# Patient Record
Sex: Male | Born: 1935 | Race: White | Hispanic: No | Marital: Married | Smoking: Former smoker
Health system: Southern US, Community
[De-identification: ages and names within clinical notes are randomized; demographics above are authoritative.]

## PROBLEM LIST (undated history)

## (undated) DIAGNOSIS — F329 Major depressive disorder, single episode, unspecified: Secondary | ICD-10-CM

## (undated) DIAGNOSIS — K219 Gastro-esophageal reflux disease without esophagitis: Secondary | ICD-10-CM

## (undated) DIAGNOSIS — R0982 Postnasal drip: Secondary | ICD-10-CM

## (undated) DIAGNOSIS — E119 Type 2 diabetes mellitus without complications: Secondary | ICD-10-CM

## (undated) DIAGNOSIS — N400 Enlarged prostate without lower urinary tract symptoms: Secondary | ICD-10-CM

## (undated) DIAGNOSIS — F419 Anxiety disorder, unspecified: Secondary | ICD-10-CM

## (undated) DIAGNOSIS — G4730 Sleep apnea, unspecified: Secondary | ICD-10-CM

## (undated) DIAGNOSIS — F32A Depression, unspecified: Secondary | ICD-10-CM

## (undated) DIAGNOSIS — D649 Anemia, unspecified: Secondary | ICD-10-CM

## (undated) DIAGNOSIS — Z8719 Personal history of other diseases of the digestive system: Secondary | ICD-10-CM

## (undated) DIAGNOSIS — M1990 Unspecified osteoarthritis, unspecified site: Secondary | ICD-10-CM

## (undated) DIAGNOSIS — J189 Pneumonia, unspecified organism: Secondary | ICD-10-CM

## (undated) DIAGNOSIS — J302 Other seasonal allergic rhinitis: Secondary | ICD-10-CM

## (undated) HISTORY — PX: LEG SURGERY: SHX1003

## (undated) HISTORY — PX: EYE SURGERY: SHX253

## (undated) HISTORY — PX: TONSILLECTOMY: SUR1361

## (undated) HISTORY — PX: COLONOSCOPY W/ BIOPSIES AND POLYPECTOMY: SHX1376

---
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
## 1959-06-25 HISTORY — PX: KNEE SURGERY: SHX244

## 1979-06-25 HISTORY — PX: KNEE ARTHROSCOPY: SUR90

## 1990-06-24 HISTORY — PX: JOINT REPLACEMENT: SHX530

## 1993-06-24 HISTORY — PX: BACK SURGERY: SHX140

## 1994-06-24 HISTORY — PX: JOINT REPLACEMENT: SHX530

## 1997-10-20 ENCOUNTER — Encounter: Admission: RE | Admit: 1997-10-20 | Discharge: 1998-01-18 | Payer: Self-pay | Admitting: *Deleted

## 2000-11-07 ENCOUNTER — Ambulatory Visit (HOSPITAL_COMMUNITY): Admission: RE | Admit: 2000-11-07 | Discharge: 2000-11-07 | Payer: Self-pay | Admitting: Gastroenterology

## 2000-11-29 ENCOUNTER — Ambulatory Visit (HOSPITAL_COMMUNITY): Admission: RE | Admit: 2000-11-29 | Discharge: 2000-11-29 | Payer: Self-pay | Admitting: Orthopaedic Surgery

## 2000-11-29 ENCOUNTER — Encounter: Payer: Self-pay | Admitting: Orthopaedic Surgery

## 2000-11-30 ENCOUNTER — Ambulatory Visit (HOSPITAL_COMMUNITY): Admission: RE | Admit: 2000-11-30 | Discharge: 2000-11-30 | Payer: Self-pay | Admitting: Orthopaedic Surgery

## 2000-11-30 ENCOUNTER — Encounter: Payer: Self-pay | Admitting: Orthopaedic Surgery

## 2000-12-02 ENCOUNTER — Encounter: Payer: Self-pay | Admitting: Orthopaedic Surgery

## 2000-12-02 ENCOUNTER — Inpatient Hospital Stay (HOSPITAL_COMMUNITY): Admission: RE | Admit: 2000-12-02 | Discharge: 2000-12-06 | Payer: Self-pay | Admitting: Orthopaedic Surgery

## 2000-12-03 ENCOUNTER — Encounter: Payer: Self-pay | Admitting: Orthopedic Surgery

## 2001-03-19 ENCOUNTER — Ambulatory Visit (HOSPITAL_BASED_OUTPATIENT_CLINIC_OR_DEPARTMENT_OTHER): Admission: RE | Admit: 2001-03-19 | Discharge: 2001-03-20 | Payer: Self-pay | Admitting: Orthopaedic Surgery

## 2001-04-17 ENCOUNTER — Encounter: Admission: RE | Admit: 2001-04-17 | Discharge: 2001-06-12 | Payer: Self-pay | Admitting: Orthopaedic Surgery

## 2002-04-13 ENCOUNTER — Encounter: Admission: RE | Admit: 2002-04-13 | Discharge: 2002-05-05 | Payer: Self-pay | Admitting: Family Medicine

## 2002-09-26 ENCOUNTER — Emergency Department (HOSPITAL_COMMUNITY): Admission: EM | Admit: 2002-09-26 | Discharge: 2002-09-26 | Payer: Self-pay | Admitting: Emergency Medicine

## 2003-06-25 HISTORY — PX: JOINT REPLACEMENT: SHX530

## 2004-11-13 ENCOUNTER — Ambulatory Visit: Payer: Self-pay | Admitting: Pulmonary Disease

## 2004-12-12 ENCOUNTER — Ambulatory Visit (HOSPITAL_BASED_OUTPATIENT_CLINIC_OR_DEPARTMENT_OTHER): Admission: RE | Admit: 2004-12-12 | Discharge: 2004-12-12 | Payer: Self-pay | Admitting: Pulmonary Disease

## 2004-12-27 ENCOUNTER — Ambulatory Visit: Payer: Self-pay | Admitting: Pulmonary Disease

## 2005-02-05 ENCOUNTER — Ambulatory Visit: Payer: Self-pay | Admitting: Internal Medicine

## 2005-02-06 ENCOUNTER — Ambulatory Visit: Payer: Self-pay | Admitting: Pulmonary Disease

## 2005-04-11 ENCOUNTER — Ambulatory Visit: Payer: Self-pay | Admitting: Pulmonary Disease

## 2005-08-20 ENCOUNTER — Encounter: Admission: RE | Admit: 2005-08-20 | Discharge: 2005-08-20 | Payer: Self-pay | Admitting: Family Medicine

## 2007-03-30 ENCOUNTER — Encounter: Admission: RE | Admit: 2007-03-30 | Discharge: 2007-03-30 | Payer: Self-pay | Admitting: Orthopaedic Surgery

## 2007-04-29 ENCOUNTER — Encounter: Admission: RE | Admit: 2007-04-29 | Discharge: 2007-04-29 | Payer: Self-pay | Admitting: Family Medicine

## 2007-06-02 ENCOUNTER — Inpatient Hospital Stay (HOSPITAL_COMMUNITY): Admission: RE | Admit: 2007-06-02 | Discharge: 2007-06-05 | Payer: Self-pay | Admitting: Orthopaedic Surgery

## 2007-06-25 HISTORY — PX: JOINT REPLACEMENT: SHX530

## 2008-05-04 DIAGNOSIS — H409 Unspecified glaucoma: Secondary | ICD-10-CM | POA: Insufficient documentation

## 2008-05-04 DIAGNOSIS — F329 Major depressive disorder, single episode, unspecified: Secondary | ICD-10-CM | POA: Insufficient documentation

## 2008-05-04 DIAGNOSIS — F3289 Other specified depressive episodes: Secondary | ICD-10-CM | POA: Insufficient documentation

## 2008-05-06 ENCOUNTER — Encounter: Admission: RE | Admit: 2008-05-06 | Discharge: 2008-05-06 | Payer: Self-pay | Admitting: Family Medicine

## 2008-06-02 ENCOUNTER — Encounter: Admission: RE | Admit: 2008-06-02 | Discharge: 2008-06-02 | Payer: Self-pay | Admitting: Family Medicine

## 2008-06-15 DIAGNOSIS — R5381 Other malaise: Secondary | ICD-10-CM | POA: Insufficient documentation

## 2008-06-24 HISTORY — PX: SHOULDER OPEN ROTATOR CUFF REPAIR: SHX2407

## 2008-06-24 HISTORY — PX: KNEE ARTHROSCOPY WITH PATELLA RECONSTRUCTION: SHX5654

## 2008-06-29 ENCOUNTER — Encounter: Admission: RE | Admit: 2008-06-29 | Discharge: 2008-06-29 | Payer: Self-pay | Admitting: Family Medicine

## 2008-08-10 ENCOUNTER — Encounter: Admission: RE | Admit: 2008-08-10 | Discharge: 2008-08-10 | Payer: Self-pay | Admitting: Family Medicine

## 2009-07-11 DIAGNOSIS — M810 Age-related osteoporosis without current pathological fracture: Secondary | ICD-10-CM | POA: Insufficient documentation

## 2009-07-11 DIAGNOSIS — N400 Enlarged prostate without lower urinary tract symptoms: Secondary | ICD-10-CM | POA: Insufficient documentation

## 2009-07-11 DIAGNOSIS — G4700 Insomnia, unspecified: Secondary | ICD-10-CM | POA: Insufficient documentation

## 2009-12-27 DIAGNOSIS — K2100 Gastro-esophageal reflux disease with esophagitis, without bleeding: Secondary | ICD-10-CM | POA: Insufficient documentation

## 2009-12-27 DIAGNOSIS — E785 Hyperlipidemia, unspecified: Secondary | ICD-10-CM | POA: Insufficient documentation

## 2010-09-17 DIAGNOSIS — J309 Allergic rhinitis, unspecified: Secondary | ICD-10-CM | POA: Insufficient documentation

## 2010-09-17 DIAGNOSIS — D649 Anemia, unspecified: Secondary | ICD-10-CM | POA: Insufficient documentation

## 2010-11-06 NOTE — Discharge Summary (Signed)
NAMEMAIJOR, HORNIG                ACCOUNT NO.:  [id number]   MEDICAL RECORD NO.:  [id number]          PATIENT TYPE:  INP   LOCATION:  5025                         FACILITY:  [facility]   PHYSICIAN:  Claude Manges. Whitfield, M.D.DATE OF BIRTH:  1932/08/24   DATE OF ADMISSION:  06/02/2007  DATE OF DISCHARGE:  06/05/2007                               DISCHARGE SUMMARY   ADMITTING DIAGNOSIS:  Unstable, lax, right total knee arthroplasty.   DISCHARGE DIAGNOSES:  1. Unstable, lax, right total knee arthroplasty.  2. History of diabetes mellitus.  3. Benign prostatic hypertrophy.  4. Sleep apnea.  5. Interstitial cystitis.  6. Dyslipidemia.  7. Depression.  8. Gastroesophageal reflux disease.  9. Hyponatremia.  10.Hypertension.   PROCEDURE:  Revision, right total knee arthroplasty, with poly exchange.   HISTORY:  Matthew Calderon is a 77 year old white male, who has a history of  bilateral knee replacements.  His right knee was replaced about 15 years  ago.  He did well until about three months prior to the surgery.  He  started having giving-way symptoms in the knee and there was not a  history of injury or trauma.  He has no pain.  X-rays revealed good  position without loosening of the total knee.  Bone scan revealed  nonspecific delayed uptake in the right femoral condyle.  Lab work was  negative for infection.  Because of the instability, it was felt that  revision of the poly was indicated.  He was admitted at this time for  that procedure.   HOSPITAL COURSE:  The patient is a 77 year old white male, admitted  June 02, 2007.  After appropriate laboratory studies were obtained,  he was taken to the operating room, where he underwent a revision of the  right total knee, that of a poly exchange.  He tolerated the procedure  well.  He had a Foley placed intraoperatively.  He continued on Dilaudid  PCA pump for postoperative pain.  Clindamycin 600 mg IV q. 8 hours times  three doses was  started in the operating room and was continued  postoperatively.  Consults for PT, OT and care management were obtained.  Ambulation, weightbearing as tolerated.  CPM 0-90 degrees of 6-8 hours  per day.  He was allowed out of bed to a chair the following day.  He  was started on heparin 3000 units subcu q. 12 hours until his Coumadin  became therapeutic.  On December 10, he was weaned off PCA to oral pain  medicines.  Foley was discontinued after PT.  Reglan 5 mg p.o. q. 8  hours times three doses was started because of hypoactive bowel sounds  and to prevent gastroparesis.  On December 11, his dressing was changed.  Hypokalemia was corrected with oral potassium of 20 mEq p.o. b.i.d.  This allowed for correction to 3.7 potassium.  His heparin was  discontinued since his INR was 3.5.  The remainder of his hospital  course was uneventful and he was discharged on December 12 to return  back to see Dr. Cleophas Dunker in two weeks postoperatively.   LABORATORY  STUDIES:  He was admitted with a hemoglobin of 13.7,  hematocrit 40.8%, white count 7500, platelets 200,000.  Discharge  hemoglobin 10.4, hematocrit 30.2%, white count 7900, platelets 185,000.   Preoperative chemistries:  Sodium 138, potassium 4.4, chloride 104, CO2  28, glucose 123, BUN 18, creatinine 0.97.  GFR greater than 60.  Total  bilirubin 0.6, alkaline phosphatase 82, SGOT 32, SGPT 36, total protein  6.4, albumin 3.9, calcium 8.7.   Discharge sodium 134, potassium 3.7, chloride 104, CO2 25, glucose 192,  BUN 12, creatinine 0.98.  GFR was greater than 60.  Urinalysis of  May 28, 2007, reveals 3-6 whites, 0-2 reds, leukocytes small.  Blood  type was O-negative, antibody screen negative.  Urine culture of  December 4 revealed no growth.   RADIOGRAPHIC STUDIES:  Chest x-ray of May 28, 2007, revealed heart  size remains within normal limits, both lungs are clear.  Eventration of  both hemidiaphragms is again noted.  There is  no evidence of pleural  effusion and no mass or adenopathy noted.   DISCHARGE INSTRUCTIONS:  He was given no restriction in his diet, though  he is diabetic, and can continue on a 60 g carb modified diet.  He can  increase his activity as tolerated and use his walker, weightbearing as  tolerated.  No lifting or driving for six weeks.  He can follow the blue  instruction sheet.  Change the dressing daily.  Physical therapy for  range of motion and straight leg raising and isometric exercises to the  right leg.  He may, again, be weightbearing as tolerated.   MEDICATIONS TO INCLUDE:  1. Lexapro 20 mg daily.  2. Centrum Silver one daily.  3. Stool softener, Colace 100 mg one to two tabs daily p.r.n.  4. Soma 350 mg one p.o. q.i.d. p.r.n. spasms.  5. Melatonin 3 mg h.s.  6. Melatonin 5 mg h.s.  7. Zolpidem 10 mg two tabs at h.s. p.r.n.  8. Fexofenadine 180 mg one tab daily.  9. Oxybutynin CL ER 10 mg daily.  10.Pravastatin 80 mg h.s.  11.Actos 45 mg daily q.a.m.  12.Astelin one spray each nostril daily in the morning.  13.Vision Formula tab daily.  14.Nortriptyline 25 mg h.s.  15.Travatan 1 gtt. each eye at h.s.  16.Prilosec 20 one tab q.a.m.  17.Norco 5/325 one to two tabs q. 4 hours p.r.n. pain, or he may use      Percocet 5/325 one to two tabs q. 4 hours p.r.n. severe pain.  Not      to be given at the same time.  18.Coumadin as per pharmacy protocol.   He needs to follow back up with Dr. Cleophas Dunker in two weeks from  discharge.  He was discharged in improved condition.      Oris Drone Petrarca, P.A.-C.      Claude Manges. Cleophas Dunker, M.D.  Electronically Signed    BDP/MEDQ  D:  06/05/2007  T:  06/05/2007  Job:  [id number]

## 2010-11-06 NOTE — Op Note (Signed)
Matthew Calderon, Matthew Calderon NO.:  [id number]   MEDICAL RECORD NO.:  [id number]          PATIENT TYPE:  INP   LOCATION:  2899                         FACILITY:  [facility]   PHYSICIAN:  Claude Manges. Whitfield, M.D.DATE OF BIRTH:  September 18, 1932   DATE OF PROCEDURE:  06/02/2007  DATE OF DISCHARGE:                               OPERATIVE REPORT   PREOPERATIVE DIAGNOSIS:  Unstable, lax, right total knee replacement.   POSTOPERATIVE DIAGNOSIS:  Unstable, lax, right total knee replacement.   OPERATION PERFORMED:  Revision, right total knee replacement including  polyethylene bridging bearing and polyethylene patella.   SURGEON:  Claude Manges. Cleophas Dunker, M.D.   ASSISTANT:  Legrand Pitts. Duffy, P.A.   ANESTHESIA:  General with supplemental femoral nerve block.   COMPLICATIONS:  None.   COMPONENTS USED:  A 15 mm polyethylene bridging bearing and a  polyethylene patella button.   DESCRIPTION OF PROCEDURE:  With the patient comfortable on the operating  table and under general orotracheal anesthesia, the nursing staff  inserted a Foley catheter.  The right lower extremity which had been  appropriately identified as the operative extremity.  He was then placed  in a thigh tourniquet.  The right lower extremity was prepped with  Betadine scrub and then  DuraPrep from the tourniquet to the ankle.  Sterile draping was performed.   The patient has had a prior medial parapatellar and a lateral incision.  The medial parapatellar incision was utilized and via sharp dissection  carried down to subcutaneous tissue.  The first layer of capsule was  incised in the midline.  The prior medial parapatellar incision was  identified with the Ethibond sutures.  These were carefully removed with  a rongeur and then a formal incision made through the capsule in line  with the skin incision.  The joint was then entered. There was minimal  joint fluid.  With some difficulty we were able to evert the patella 180  degrees.  There was just slight avulsion of the patellar tendon from the  tibial tubercle.  We were able to fully extend the knee and flexed just  to 90 but there was increased laxity with varus and valgus stress.  There was a grayish synovitis consistent with reactive synovitis to the  polyethylene.  The polyethylene appeared to be worn both medially and  laterally.  We had difficulty advancing the tibia anteriorly and after  doing a synovectomy and releasing the scar tissue, we were able to  advance the tibia anteriorly and remove the old polyethylene component.  There was considerable wear posteriorly.  Initial surgery was performed  approximately 12 years ago.  Further synovectomy was performed.  I  carefully checked the femoral and tibial patellar components but did not  see any evidence of loosening. Preoperatively, the patient had a normal  C reactive protein and sed rate and bone scan.   Synovectomy was performed throughout the joint.  We were able to  eventually advance the tibia anteriorly enough that we could remove the  old polyethylene component and then performed further release  posteriorly where it was  quite tight.  There was abundant cicatrix or  scar tissue.  I think that there were some remaining fibers of the  posterior cruciate ligament.  I again checked synovitis around the  femoral component.  Did not feel I could penetrate more than several  millimeters and the component did not appear to be loose.  Immediate  release was performed as I thought it the components were a little bit  tighter medially than laterally.   We then trialed a 12.5 thickness bearing which was the same thickness of  the original bearing and felt that it was still a little bit too loose  and then we trialed a 15 and I felt that was just perfect.  With full  extension, I could actually flex probably 105 to 110 degrees whereas  preoperatively I was only able to flex to 90 degrees.  I believe   releasing the gutters and the posterior capsule increased our flexion.   The joint was then copiously irrigated with jet saline.  The final 15 mm  polyethylene component was inserted.  This matched the size of the femur  and then through a full range of motion there was no spinning and there  did not appear to be tighter medially or laterally.  We still had full  extension and no opening with either varus or valgus stress.   The polyethylene button to the patella was then removed.  Synovectomy  was performed about it.  The patellar component did not appear to be  loose.  A new polyethylene bearing was then applied.  Again through a  full range of motion the patella tracked in midline and had good  rotation.  The joint was again irrigated with saline solution, the  tourniquet was deflated with immediate capillary refill.  There were  several geniculate bleeders which were Bovie coagulated.  We had a nice  dry field.  Hemovac was not necessary.  The deep capsule was then closed  with interrupted #1 Ethibond, superficial capsule with running 2-0  Vicryl, skin closed with skin clips.  Sterile bulky dressing was applied  followed by the patient's support stocking.   The patient tolerated the procedure without complications.  Tourniquet  time was just over an hour.      Claude Manges. Cleophas Dunker, M.D.  Electronically Signed     PWW/MEDQ  D:  06/02/2007  T:  06/02/2007  Job:  [id number]

## 2010-11-09 NOTE — Procedures (Signed)
[facility]  Patient:    Matthew Calderon, Matthew Calderon                         MRN: [id number] Proc. Date: 11/07/00 Adm. Date:  [id number] Attending:  Louie Bun CC:         Sidney Ace, M.D. [facility]  Heather Roberts, M.D.   Procedure Report  PROCEDURE:  Esophagogastroduodenoscopy with esophageal dilatation.  INDICATION FOR PROCEDURE:  The patient is scheduled for surveillance colonoscopy who has been seeing Dr. Teller Callas for hoarseness, and a reflux etiology was postulated.  He was started on Nexium with partial improvement. He also has had occasional solid food dysphagia suggestive of a possible lower esophageal ring or stricture.  DESCRIPTION OF PROCEDURE:   The patient was placed in the left lateral decubitus position and placed on the pulse monitor with continuous low-flow oxygen delivered by nasal cannula.  He was sedated with 60 mg IV Demerol and 6 mg IV Versed.  The Olympus video endoscope was advanced under direct vision into the oropharynx and esophagus.  The esophagus was straight and of normal caliber with the squamocolumnar line at 38 cm.  There was some mild esophagitis involving the distal 2 cm of the esophagus with no discrete erosions and no visible stricture.  I could not clearly discern a hiatal hernia, but there did appear to be some reflux of gastric juices into the esophagus during the period of observation.  The stomach was entered, and a small amount of liquid secretions were suctioned from the fundus.  Retroflex view of the cardia was unremarkable.  The fundus, body, antrum, and pylorus all appeared normal.  The duodenum was entered, and both the bulb and the second portion were well inspected and appeared to be within normal limits.  A Savary guidewire was placed through the endoscope channel and the scope withdrawn.  A single 16 mm Savary dilator was passed over the guidewire to moderate resistance and removed together with the  guidewire.  The patient was then prepared for colonoscopy.  He tolerated the procedure well, and there were no immediate complications.  IMPRESSION:  Mild esophagitis.  Otherwise normal EGD.  Status post Savary dilatation due to complaints of intermittent solid food dysphagia.  PLAN:  Continue Nexium and will observe response to dilatation when he resumes diet. DD:  11/07/00 TD:  11/08/00 Job: [id number]

## 2010-11-09 NOTE — Procedures (Signed)
NAMEELIN, SEATS NO.:  [id number]   MEDICAL RECORD NO.:  [id number]          PATIENT TYPE:  OUT   LOCATION:  SLEEP CENTER                 FACILITY:  [facility]   PHYSICIAN:  Marcelyn Bruins, M.D. [facility] DATE OF BIRTH:  [birth year]   DATE OF STUDY:  12/12/2004                              NOCTURNAL POLYSOMNOGRAM   REFERRING PHYSICIAN:  Dr. Danice Goltz   INDICATION FOR THE STUDY:  Hypersomnia with sleep apnea. Epworth score: 7.   SLEEP ARCHITECTURE:  The patient had a total sleep time of 355 minutes with  very diminished REM and never achieved slow wave sleep. Sleep onset latency  was mildly prolonged at 30 minutes and REM onset was very prolonged at 172  minutes. Sleep efficiency was 84%.   IMPRESSION:  1.  Moderate obstructive sleep apnea/hypopnea syndrome with a respiratory      disturbance index of 26 events per hour and oxygen desaturation as low      as 87%. The events were not positional. Treatment for this degree of      sleep apnea may include weight loss, upper airway surgery, oral      appliance, and finally C-PAP.  2.  The patient was scheduled for a split night study. However, this was      unable to be done secondary to the patient's late sleep onset.  3.  Rare premature atrial contraction.  4.  Very large numbers of leg jerks with significant sleep disruption.      Clinical correlation is suggested if the patient continues to have      difficulty after appropriate treatment of his obstructive sleep apnea.     ______________________________  Suzzette Righter    KC/MEDQ  D:  12/27/2004 15:06:47  T:  12/27/2004 15:54:47  Job:  [id number]

## 2010-11-09 NOTE — Procedures (Signed)
[facility]  Patient:    Matthew Calderon, Matthew Calderon                         MRN: [id number] Proc. Date: 11/07/00 Adm. Date:  [id number] Attending:  Louie Bun CC:         Sidney Ace, M.D. [facility]  Heather Roberts, M.D.   Procedure Report  PROCEDURE:  Colonoscopy.  INDICATION FOR PROCEDURE:  History of adenomatous colon polyps.  DESCRIPTION OF PROCEDURE:  The patient was placed in the left lateral decubitus position and placed on the pulse monitor with continuous low-flow oxygen delivered by nasal cannula.  He had been sedated for a previous EGD, and no further sedation was required for this procedure.  The Olympus video colonoscope was inserted into the rectum and advanced to the cecum, confirmed by transillumination at McBurneys point and visualization of the ileocecal valve and appendiceal orifice.  The prep was suboptimal in the proximal hepatic flexure but with irrigation with several syringes of water and subsequent aspiration, an adequate view could be obtained.  The cecum and ascending colon appeared normal, as did the transverse colon with no masses, polyps, diverticula, or other mucosal abnormalities.  Within the descending and sigmoid colon, there were several scattered diverticula and no other abnormalities.  The rectum appeared normal, and retroflex view of the anus revealed no obvious internal hemorrhoids.  The colonoscope was then withdrawn and the patient returned to the recovery room in stable condition.  He tolerated the procedure well, and there were no immediate complications.  IMPRESSION:  Diverticulosis, otherwise normal colonoscopy.  PLAN:  Repeat colonoscopy in five years. DD:  11/07/00 TD:  11/08/00 Job: [id number]

## 2010-11-09 NOTE — Op Note (Signed)
[facility]  Patient:    Matthew Calderon, Matthew Calderon                         MRN: [id number] Proc. Date: 12/02/00 Adm. Date:  [id number] Attending:  Randolm Idol                           Operative Report  PREOPERATIVE DIAGNOSIS:  End-stage osteoarthritis, left hip.  POSTOPERATIVE DIAGNOSIS:  End-stage osteoarthritis, left hip.  PROCEDURE:  Left total hip replacement.  SURGEON:  Claude Manges. Cleophas Dunker, M.D.  ASSISTANTS:  Lenard Galloway. Chaney Malling, M.D., Arnoldo Morale, P.A.  ANESTHESIA:  General orotracheal.  COMPLICATIONS:  None.  COMPONENTS:  DePuy AML hip stem with porocoat 15 mm diameter standard, a 28 mm hip ball with a +5 neck, 100 series Duraloc 52 mm outer diameter acetabular component with an Apex hole eliminator, and the Enduron 10 degree polyethylene liner.  DESCRIPTION OF PROCEDURE:  The patient comfortable on the operating table and under general orotracheal anesthesia.  The patient was placed in the lateral decubitus position, the left side up.  The patient was secured to the operating table with the Innomed hip system.  The left hip was then prepped from the iliac crest to the foot with Duraprep.  Sterile draping was performed.  A routine southern incision was utilized by sharp dissection, carried down to subcutaneous tissue.  Small bleeders were Bovie coagulated.  Self-retaining retractors were inserted.  The iliotibial band was identified and incised along the length of the skin incision.  Because of the patients deformity, internal rotation was difficult.  The short external rotators were identified posterior to the greater trochanter, and tendinous structures were tagged with 0 Tycron suture.  The short external rotators were then completely incised at their attachment to the greater trochanter.  The capsule was identified and incised along the femoral neck and head.  There was very little effusion and very little synovitis.  The hip  was then dislocated posteriorly.  The neck was incised about a fingerbreadth proximal to the lesser trochanter using the AML hip guide and the oscillating saw.  The femoral head was thus delivered from the acetabulum. It was misshapen, and there was little if any articular cartilage, and there were cysts present in the subchondral bone.  Retractors were then placed about the femoral canal.  A starter hole was made in the piriformis fossa.  Reaming was performed to 14.5 to accept a 15 mm prosthesis.  We had a nice, flush fit on the calcar, but we did use the calcar reamer to obtain the appropriate angle.  Retractors were then placed about the acetabulum.  The labrum was sharply excised, as well as the soft tissue in the fovea.  Small bleeders were Bovie coagulated.  Reaming was performed to 51 mm outer diameter to accept a 52 mm prosthesis.  We trialled a 52 mm acetabulum.  We had a nice fit.  We completely seated.  We had a good, tight rim fit.  We trialled a 52 and could not completely seat it.  The 52 mm outer diameter Duraloc 100 series acetabulum was then impacted in place, followed by the trial acetabular liner.  The 15 mm broach was then inserted, followed by the regular neck.  We trialled a 1.5 mm neck, and it was too short and loose.  We trialled a +5 neck with a 28 mm head.  We had an appropriately stable construct in all planes.  All the trial components were removed.  The wound was irrigated with saline solution and antibiotic solution.  The Apex hole eliminator was inserted, followed by the Enduron polyethylene liner with a 10 degree posterior build-up.  The 15 mm AML porocoat hip stem was then impacted flush in the calcar.  We again trialled the +5 28 mm neck with excellent stability, and we felt that the leg lengths were symmetrical.  The trial head was removed.  The final head was applied after drying off the femoral neck.  The hip was then reduced.  The acetabulum was  clear.  Again with a free range of motion, the construct was perfectly stable.  The wound was again irrigated with antibiotic solution.  The hip extended without dislocation and flexed without dislocation.  The capsule was then closed with 0 Tycron suture, the short external rotators reapproximated with 0 Tycron suture.  The iliotibial band closed with a running Vicryl, the subcutaneous tissue closed in two layers with 0 and 2-0 Vicryl, the skin closed with skin clips.  A sterile bulky dressing was applied.  The patient was placed in the supine position on the operating room stretcher and a knee immobilizer applied.  The patient had good pulses, good capillary refill to the toes.  The patient tolerated the procedure without complications. DD:  12/02/00 TD:  12/02/00 Job: [id number]

## 2010-11-09 NOTE — H&P (Signed)
. [facility]  Patient:    Matthew Calderon, Matthew Calderon                         MRN: [id number] Adm. Date:  12/02/00 Attending:  Claude Manges. Cleophas Dunker, M.D. Dictator:   Arnoldo Morale, P.A.                         History and Physical  DATE OF BIRTH:  February 28, 1933  CHIEF COMPLAINT:  Progressively worsening left hip pain for the last three years.  HISTORY OF PRESENT ILLNESS:  This 77 year old white male patient presented to Dr. Cleophas Dunker with a three year history of gradual onset of progressively worsening right hip pain. He has no known injury or prior surgery to his hip. The pain came on gradually and is described as mostly a dull ache, which becomes sharp at times, located in the left groin with radiation to the lateral aspect of the trochanter and into his buttocks. There is no other radiation of the pain. It is pretty much intermittent, but is constant with any walking, weightbearing or walking up a slope. The pain decreases with rest. He has tried Relafen in the past for pain and he is not sure whether that helps at all. He has severely limited the amount of walking he has done for the last several months or so. He has no night pain or paresthesias and he does not require the use of any assistive devices. He does complain of occasional catching in the hip. He does have a history of low back pain, which was treated in 1996 by diskectomy by Dr. Newell Coral.  ALLERGIES:  PENICILLIN causes hives.  CURRENT MEDICATIONS:  1. Relafen 500 mg one tablet p.o. b.i.d. with the last dose on November 25, 2000.  2. Starlix 120 mg one tablet p.o. t.i.d.  3. Delatestryl 200 mg 1 cc injection every three weeks.  4. Flomax 0.4 mg one tablet p.o. q.d.  5. Ditropan XL 10 mg one tablet p.o. q.d.  6. Ambien 10 mg one tablet p.o. q.h.s.  7. Nortriptyline 25 mg one tablet p.o. q.h.s.  8. Nasonex nasal spray 50 mcg two sprays in each nostril q.d.  9. Astelin nasal spray 137 mcg two sprays  each nostril q.12h. 10. Advair Diskus 500/50 mg one puff inhaled q.12h. 11. Singulair 10 mg one tablet p.o. q.h.s. 12. Nexium 40 mg one capsule p.o. q.d. 13. Allergy shot one shot injected subcu q. week. 14. Pulmicort inhaler 200 mg two puffs inhaled q.12h. 15. Alurex one tablet p.o. q.a.m. and one tablet p.o. q.p.m. The yellow tablet     is for the morning and blue tablet at evening. 16. Medrol Dosepak take as directed for a week, this week prior to surgery,     started June 3.  PAST MEDICAL HISTORY:  1. He has been a type 2 diabetic for the last year and is followed by     Dr. Juleen China.  2. He is being evaluated for probable gastroesophageal reflux disease and he     does have a history of a hiatal hernia that is followed by Dr. Dorena Cookey.  3. He has asthma/allergies and he has been treated for those with multiple     inhalers for the last month per Dr. [place] Callas.  4. History of interstitial cystitis and benign prostatic hypertrophy.  5. Osteoarthritis.  6. History of irritable bowel syndrome when he was  working, but he has had no     symptoms since 1991.  7. Denies any history of hypertension, thyroid disease, peptic ulcer disease,     heart disease or any other chronic medical condition other than noted     previously.  PAST SURGICAL HISTORY:  1. Tonsillectomy by Dr. Ruthann Cancer.  2. Bilateral open arthrotomies to his knees in 1954.  3. Left knee arthrotomy and partial meniscectomy in 1962.  4. Left knee possible high tibial osteotomy in 1972.  5. Right knee high tibial osteotomy by Dr. Priscille Kluver in 1976.  6. Left knee arthroscopy by Dr. Claude Manges. Whitfield in 1980.  7. Left total knee arthroplasty by Dr. Claude Manges. Whitfield in 1991.  8. Right total knee arthroplasty by Dr. Claude Manges. Whitfield in 1996.  9. Surgery for herniated and bulging disk in 1996 by Dr. Newell Coral.  SOCIAL HISTORY:  He has a 15 pack year history of cigarette smoking, which he quit 30 years ago. He drinks one  alcoholic beverage a day. He does not use any drugs. He is married and does not have any children. He and his wife live in a one story house with two steps into the main entrance. He is a retired Chemical engineer. His medical doctor is Dr. Heather Roberts and her phone number is [phone].  FAMILY HISTORY:  His mother passed away at age 32 with osteoarthritis and a lung infection. His father passed away at age 46 with a history of osteoarthritis and hypertension. He has two brothers who are alive; one age 60 with macular degeneration and osteoarthritis, one age 40 with osteoarthritis, hypertension and diabetes. He has one living sister age 53 with osteoarthritis.  REVIEW OF SYSTEMS:  He has been having difficulty with hoarseness for several months. That is being evaluated for possible reflux and asthma. He is on multiple medications for that. He also has a sensation of something being caught in his throat and that has been going on for awhile. He has significant degenerative disk disease in the cervical spine. He does have a nonproductive frequent cough. He wears glasses. He does have some urinary hesitancy and nocturia two to three times a night. Also has some frequency. He does have a Living Will and his Power of Gerrit Friends is Ms. Raymont Andreoni, his wife. All other systems are negative and noncontributory at this time.  PHYSICAL EXAMINATION:  GENERAL:  Well-developed, well-nourished short white male in no acute distress. Walks with an antalgic gait and slight limp on the left. Mood and affect is appropriate. Talks easily with the examiner. Height 5 feet 0 inches, weight 158 pounds. BMI is 30.  VITAL SIGNS:  Temperature 98.4 degrees Fahrenheit, pulse 100, respiratory rate 20, blood pressure 138/86.  HEENT:  Normocephalic atraumatic without frontal or maxillary sinus tenderness to palpation. Conjunctivae pink, sclerae anicteric. PERRLA. EOM intact. Funduscopic exam shows brisk red  reflex bilaterally with normal right neurovasculature, optic disk and cup. No visible external ear deformities. Hearing is grossly intact. Tympanic membranes pearly gray bilaterally with  good light reflex. Nose and nasal septum midline. Nasal mucosa pink and moist without exudates or polyps noted. Buccal mucosa pink and moist. Good dentition. Pharynx without erythema or exudates. Tongue and uvula midline. Tongue without fasciculations and uvula arises equally with phonation.  NECK:  Well-healed low cervical incision line on the posterior aspect of the neck. No other obvious deformity of the neck. Trachea midline. No palpable lymphadenopathy nor thyromegaly. Carotids +2 bilaterally without bruits.  SPINE:  Full range of motion of the cervical spine without pain and nontender to palpation along the cervical spine.  CARDIOVASCULAR:  Heart rate and rhythm regular. S1 and S2 present without rubs, clicks or murmurs noted.  RESPIRATORY:  Respirations even and unlabored. Breath sounds clear to auscultation bilaterally without rales or wheezes noted.  ABDOMEN:  Rounded abdominal contour. Bowel sounds present x 4 quadrants. Soft and nontender to palpation without hepatosplenomegaly nor CVA tenderness to palpation. Femoral pulses +2 bilaterally. Nontender to palpation along the entire length of the vertebral column.  BREASTS/GU/RECTAL:  These exams deferred at this time.  MUSCULOSKELETAL:  No obvious deformities bilateral upper extremities with full range of motion of these extremities without pain. Radial pulses are +2 bilaterally. He has full range of motion of ankles and toes bilaterally. DP and PT pulses are +2. He has well-healed curvilinear incisions on both knees. There is no effusion or ecchymosis noted bilaterally. He has full extension and flexion to about 90 degrees in both knees. Nontender along the joint line. Collateral ligaments are stable. His right hip has full extension  with flexion to about 100 degrees. He has good internal and external rotation of the right hip without pain. No pain with palpation along the right groin or greater trochanter. The left hip has minimal pain with palpation in the groin and greater trochanter. Full extension and flexion to about 100 degrees. He has decreased internal and external rotation when compared to the right, but still has fairly good range of motion. Internal and external rotation does cause pain in the left groin.  NEUROLOGICAL:  Alert and oriented x 3. Cranial nerves II-XII are grossly intact. Strength 5/5 bilateral upper and lower extremities with sensation intact. Deep tendon reflex is 2+ bilateral upper extremities and 1+ bilateral lower extremities. Rapid alternating movements intact.  RADIOLOGICAL FINDINGS:  X-rays taken of both hips in May 2002 show considerable degenerative changes noted in the left hip. He also has considerable degenerative arthrosis in his back.  IMPRESSION: 1. End-stage osteoarthritis left hip. 2. Degenerative disk disease lumbar spine. 3. Type 2 diabetes mellitus. 4. Gastroesophageal reflux disease. 5. Asthma. 6. Allergies. 7. Benign prostatic hypertrophy. 8. History of interstitial cystitis. 9. History of irritable bowel syndrome.  PLAN:  Mr. Everard will be admitted to [facility] on December 02, 2000 where he will undergo a left total hip arthroplasty by Dr. Claude Manges. Whitfield. He will undergo all the routine preoperative laboratory tests and studies prior to this procedure. Any medical problems while he is hospitalized we will contact Dr. Heather Roberts.DD:  11/26/00 TD:  11/27/00 Job: [id number]

## 2010-11-09 NOTE — Discharge Summary (Signed)
Matthew Calderon. [facility]  Patient:    Matthew Calderon, Matthew Calderon                         MRN: [id number] Adm. Date:  [id number] Disc. Date: [id number] Attending:  Randolm Idol Dictator:   Arnoldo Morale, P.A.-C. CC:         Heather Roberts, M.D.   Discharge Summary  ADMITTING DIAGNOSES: 1. End-stage osteoarthritis, left hip. 2. Degenerative disk disease, lumbar spine. 3. Type 2 diabetes mellitus. 4. Gastroesophageal reflux disease. 5. Asthma. 6. Allergies. 7. Benign prostatic hypertrophy. 8. History of interstitial cystitis. 9. History of irritable bowel syndrome.  DISCHARGE DIAGNOSES: 1. Post-hemorrhagic anemia. 2. Constipation. 3. Urinary tract infection.  SURGICAL PROCEDURE:  On December 02, 2000, the patient underwent a left total hip arthroplasty by Dr. Claude Manges. Whitfield assisted by Rinaldo Ratel, M.D., and Arnoldo Morale, P.A.-C.  COMPLICATIONS:  None.  CONSULTS: 1. Pharmacy consult for Coumadin therapy, December 02, 2000. 2. Rehabilitation medicine, physical therapy, and occupational therapy consult    on December 03, 2000. 3. Case management consult, December 06, 2000.  HISTORY OF PRESENT ILLNESS:  This 77 year old white male presented to Dr. Cleophas Dunker with a three-year history of gradual onset progressively worsening right hip pain.  No known injury or surgery to the hip, and the pain began gradually and is described as a dull ache which becomes sharp at times located in the left groin with radiation to the lateral aspect of the trochanter and his buttock.  Pain is intermittent but constant with any walking, weightbearing, or walking up a slope.  The pain decreases with rest. Anti-inflammatories have not really helped his pain and he is complaining of some catching in the hip.  Because of this, he is presenting for a left total hip arthroplasty.  HOSPITAL COURSE:  Mr. [name] tolerated his surgical procedure well and without immediate postoperative  complications.  He was subsequently transferred to 5000.  He was started on Coumadin per protocol.  He did have difficulty with urinating postoperatively and a Foley needed to be placed.  On postoperative day #1, T max was 100.8, vital signs were stable.  Pain was well controlled with current medications.  Hemoglobin 10.3, hematocrit 29.7.  He was started on therapy per protocol and Foley was discontinued.  He did have difficulty with elevated temperature on postoperative day #2 with a maximum of 102.7. This did resolve some with Tylenol.  Blood cultures and chest x-ray were obtained at that time.  They were all negative.  On postoperative day #2, T max was 102, vital signs stable.  Left hip incision was well approximated with staples and minimal drainage.  Hemoglobin was 9.9, hematocrit 28.5, white count 11.2, PT 19.4, INR 2.1.  Cultures and chest x-ray were all negative.  He was switched to p.o. pain medications and continued on therapy per protocol.  On postoperative day #3, he did have some difficulty with constipation and laxative helped relieve that.  T max was 101.2.  Left hip incision was unchanged.  White count had decreased to 11, hemoglobin 9.6, hematocrit 27.9.  On December 06, 2000, he was doing very well and felt well enough to go home.  He did complain of some mild burning with urination since his Foley had been discontinued and he was started on Cipro 250 mg p.o. b.i.d. for five days for a urinary tract infection.  T max was 101.  Left hip incision was unchangd. White count  at this time was 8.6 with a hemoglobin of 9.3, hematocrit 26.4, platelets 285, and PT 20.2, with an INR of 2.2.  He was able to be discharged home that day.  DISCHARGE INSTRUCTIONS: 1. Diet:  He is to resume his regular prehospitalization diet. 2. Medications:  He is to resume all prehospitalization medications with the    exception of Relafen and Medrol.  Additional medications include:    a. OxyContin  20 mg one tablet p.o. q.12h., #21, with no refill.    b. Percocet 5 mg one to two p.o. q.4h. p.r.n. for pain, #45, with no       refill.    c. Cipro 250 mg one tablet p.o. b.i.d., #9, with no refill.    d. Coumadin one tablet p.o. q.d. with the dose to be determined by       pharmacy. 3. He is to be out of bed with partial weightbearing of 50% or less on the    left leg with the use of the walker.  He has arranged for home health    physical therapy, registered nurse, and pharmacy to help with therapy and    monitor his Coumadin. 4. He needs follow-up appointment with Dr. Cleophas Dunker on December 15, 2000, and    needs to call [phone] to set up that appointment. 5. He is to notify Dr. Cleophas Dunker of temperature graeter than 101.5, chills,    pain unrelieved by pain medications, or foul-smelling drainage from the    wound. He stated good understanding of these instructions and was    subsequently discharged home.  LABORATORY DATA:  Chest x-ray no December 03, 2000 showed no acute pulmonary disease.  X-ray of his pelvis on June 11 showed good position of the left total hip prosthesis.  On June 12, hemoglobin 10.3, hematocrit 29.7.  That was at 0500.  At 8:00 that evening, his white count was 12.6, hemoglobin 10, hematocrit 29.1. On June 13, white count 11.2, hemoglobin 9.9, hematocrit 28.5.  On June 14, white count 11, hemoglobin 9.6, hematocrit 27.9, and on June 15, white count was 8.6, hemoglobin 9.3, hematocrit 26.4, and platelets 285.  On June 10, his PT was 14, INR 1.2, and PTT 32.  On June 15, PT 20.2, INR 2.2. On June 10, his glucose was 173.  On June 12, glucose was 199, calcium 7.6. On June 13, sodium 134, potassium 3.4, glucose 192, and calcium 7.8.  Urinalysis on June 10 showed 100 mg/dl of glucose.  On June 12, it showed 250 mg/dl of glucose.  All other indices were within normal limits.  Blood cultures showed no growth after five days, urine culture no growth after one day.  All  other laboratory studies were within normal limits. DD:  12/19/00  TD:  12/19/00 Job: [id number]

## 2010-11-09 NOTE — Op Note (Signed)
[facility]  Patient:    Matthew Calderon, Matthew Calderon. Visit Number: [id number] MRN: [id number]          Service Type: Attending:  Claude Manges. Cleophas Dunker, M.D. Dictated by:   Claude Manges. Cleophas Dunker, M.D. Proc. Date: 03/19/01                             Operative Report  PREOPERATIVE DIAGNOSIS: 1. Rotator cuff tear with impingement right shoulder. 2. Degenerative joint disease acromioclavicular joint.  POSTOPERATIVE DIAGNOSIS: 1. Rotator cuff tear with impingement right shoulder. 2. Degenerative joint disease acromioclavicular joint.  OPERATION PERFORMED: 1. Rotator cuff tear repair with acromioplasty. 2. Resection distal clavicle.  SURGEON:  Claude Manges. Cleophas Dunker, M.D.  ASSISTANT:  Arnoldo Morale, P.A.  ANESTHESIA:  General orotracheal.  COMPLICATIONS:  None.  INDICATIONS FOR PROCEDURE:  The patient is a 77 year old white male approximately 3-1/2 months status post hip replacement doing very well. Approximately a week before his hip surgery, he fell, injuring his right shoulder.  He has had a subsequent MRI scan revealing a rotator cuff tear.  He is having more and more trouble with overhead motion and pain, difficulty sleeping and now is to have exploration with rotator cuff repair.  He is having some pain at the [facility] joint and films do reveal arthrosis at the [facility] joint.  DESCRIPTION OF PROCEDURE:  With the patient comfortable on the operating table in the semisitting position, the right shoulder was prepped with DuraPrep from the base of the neck circumferentially below the elbow.  Sterile draping was performed.  A slightly oblique anterior incision was made beginning behind the acromioclavicular joint coursing anteriorly.  Via sharp dissection the incision was carried down to the subcutaneous tissues.  Gross bleeders were Bovie coagulated.  The incision was carried down through the deltoid fascia and through the [facility] joint capsule.  There was fluid beneath the  subdeltoid fascia.  A distal clavicle resection was performed with the oscillating saw.  There was obvious chondromalacia and there was no further impingement beneath the remaining clavicle.  Bone wax was applied to bleeding bone edge.  An anterior inferior acromioplasty was then performed.  There was obvious anterior and lateral overhand  causing impingement.  I supplemented the acromioplasty with rasps so that it was a nice smooth surface and then applied bone wax to bleeding bone surface.  Self-retaining retractor was then inserted.  There was an obvious rotator cuff tear extending at least an inch involving very superior portions of the subscapularis and the supraspinatus.  The deltoid was intact.  The biceps tendon was intact.  I debrided the edges so that there was good bleeding edge, removed some of the osteophytes from the humeral head and there was good bleeding bone surface. One Mitek anchor was inserted.  Then I was able to repair tendon more laterally with an excellent repair. There was no retraction.  There was motion and there was no evidence of any further impingement.  The wound was irrigated with saline solution.  The deltoid fascia was closed anatomically with a running 0 Vicryl.  Subcutaneous closed with a running 2-0 Vicryl.  Skin closed with skin clips.  0.25% Marcaine with epinephrine was injected in the wound edges.  Sterile bulky dressing was applied followed by a sling.  The patient tolerated the procedure without complications.  PLAN:  Recovery care overnight.  Discharge in the a.m. with Percocet for pain. Office one week. Dictated  by:   Claude Manges. Cleophas Dunker, M.D. Attending:  Claude Manges. Cleophas Dunker, M.D. DD:  03/19/01 TD:  03/19/01 Job: [id number]

## 2010-11-16 ENCOUNTER — Other Ambulatory Visit: Payer: Self-pay | Admitting: Family Medicine

## 2010-11-20 ENCOUNTER — Other Ambulatory Visit: Payer: Self-pay

## 2011-04-01 LAB — CBC
HCT: 31.7 — ABNORMAL LOW
HCT: 40.8
Hemoglobin: 11.1 — ABNORMAL LOW
Hemoglobin: 11.6 — ABNORMAL LOW
Hemoglobin: 13.7
MCHC: 33.5
MCV: 83.2
MCV: 84.8
Platelets: 188
Platelets: 195
Platelets: 200
RBC: 3.63 — ABNORMAL LOW
RBC: 3.86 — ABNORMAL LOW
RDW: 14.6
WBC: 7.5
WBC: 7.9
WBC: 8
WBC: 9.9

## 2011-04-01 LAB — URINALYSIS, ROUTINE W REFLEX MICROSCOPIC
Bilirubin Urine: NEGATIVE
Hgb urine dipstick: NEGATIVE
Ketones, ur: NEGATIVE
Specific Gravity, Urine: 1.02
pH: 6.5

## 2011-04-01 LAB — TISSUE CULTURE: Gram Stain: NONE SEEN

## 2011-04-01 LAB — COMPREHENSIVE METABOLIC PANEL
AST: 32
Alkaline Phosphatase: 82
CO2: 28
Chloride: 104
GFR calc Af Amer: >60
GFR calc non Af Amer: >60
Glucose, Bld: 123 — ABNORMAL HIGH
Potassium: 4.4

## 2011-04-01 LAB — CROSSMATCH: Antibody Screen: NEGATIVE

## 2011-04-01 LAB — BASIC METABOLIC PANEL
BUN: 12
Calcium: 8 — ABNORMAL LOW
Chloride: 103
Chloride: 104
Creatinine, Ser: 0.87
Creatinine, Ser: 0.87
Creatinine, Ser: 0.98
GFR calc Af Amer: >60
GFR calc Af Amer: >60
GFR calc non Af Amer: >60
GFR calc non Af Amer: >60
GFR calc non Af Amer: >60
Glucose, Bld: 141 — ABNORMAL HIGH
Potassium: 3.6

## 2011-04-01 LAB — HEMOGLOBIN A1C: Mean Plasma Glucose: 154

## 2011-04-01 LAB — PROTIME-INR
INR: 1.3
INR: 2.9 — ABNORMAL HIGH
INR: 3.5 — ABNORMAL HIGH
INR: 3.6 — ABNORMAL HIGH
Prothrombin Time: 36.6 — ABNORMAL HIGH
Prothrombin Time: 37.1 — ABNORMAL HIGH

## 2011-04-01 LAB — DIFFERENTIAL
Eosinophils Absolute: 0.5
Eosinophils Relative: 7 — ABNORMAL HIGH
Neutro Abs: 4.9
Neutrophils Relative %: 65

## 2011-04-01 LAB — URINE CULTURE
Colony Count: NO GROWTH
Culture: NO GROWTH

## 2011-04-01 LAB — ANAEROBIC CULTURE: Gram Stain: NONE SEEN

## 2011-04-01 LAB — APTT: aPTT: 33

## 2011-04-01 LAB — URINE MICROSCOPIC-ADD ON

## 2013-01-13 DIAGNOSIS — H401113 Primary open-angle glaucoma, right eye, severe stage: Secondary | ICD-10-CM | POA: Insufficient documentation

## 2013-01-16 DIAGNOSIS — H353190 Nonexudative age-related macular degeneration, unspecified eye, stage unspecified: Secondary | ICD-10-CM | POA: Insufficient documentation

## 2013-12-01 DIAGNOSIS — Z961 Presence of intraocular lens: Secondary | ICD-10-CM | POA: Insufficient documentation

## 2013-12-07 ENCOUNTER — Other Ambulatory Visit: Payer: Self-pay | Admitting: Orthopaedic Surgery

## 2013-12-31 ENCOUNTER — Encounter (HOSPITAL_COMMUNITY): Payer: Self-pay | Admitting: Pharmacy Technician

## 2014-01-03 ENCOUNTER — Encounter (HOSPITAL_COMMUNITY)
Admission: RE | Admit: 2014-01-03 | Discharge: 2014-01-03 | Disposition: A | Discharge status: Home / Self Care | Payer: Medicare PPO | Source: Ambulatory Visit | Attending: Orthopaedic Surgery | Admitting: Orthopaedic Surgery

## 2014-01-03 ENCOUNTER — Encounter (HOSPITAL_COMMUNITY): Payer: Self-pay

## 2014-01-03 ENCOUNTER — Ambulatory Visit (HOSPITAL_COMMUNITY)
Admission: RE | Admit: 2014-01-03 | Discharge: 2014-01-03 | Disposition: A | Discharge status: Home / Self Care | Payer: Medicare PPO | Source: Ambulatory Visit | Attending: Orthopaedic Surgery | Admitting: Orthopaedic Surgery

## 2014-01-03 DIAGNOSIS — Z01818 Encounter for other preprocedural examination: Secondary | ICD-10-CM | POA: Insufficient documentation

## 2014-01-03 HISTORY — DX: Postnasal drip: R09.82

## 2014-01-03 HISTORY — DX: Unspecified osteoarthritis, unspecified site: M19.90

## 2014-01-03 HISTORY — DX: Type 2 diabetes mellitus without complications: E11.9

## 2014-01-03 HISTORY — DX: Depression, unspecified: F32.A

## 2014-01-03 HISTORY — DX: Gastro-esophageal reflux disease without esophagitis: K21.9

## 2014-01-03 HISTORY — DX: Anxiety disorder, unspecified: F41.9

## 2014-01-03 HISTORY — DX: Benign prostatic hyperplasia without lower urinary tract symptoms: N40.0

## 2014-01-03 HISTORY — DX: Sleep apnea, unspecified: G47.30

## 2014-01-03 HISTORY — DX: Major depressive disorder, single episode, unspecified: F32.9

## 2014-01-03 HISTORY — DX: Anemia, unspecified: D64.9

## 2014-01-03 HISTORY — DX: Other seasonal allergic rhinitis: J30.2

## 2014-01-03 HISTORY — DX: Pneumonia, unspecified organism: J18.9

## 2014-01-03 HISTORY — DX: Personal history of other diseases of the digestive system: Z87.19

## 2014-01-03 LAB — CBC WITH DIFFERENTIAL/PLATELET
BASOS ABS: 0 10*3/uL (ref 0.0–0.1)
Basophils Relative: 0 % (ref 0–1)
EOS PCT: 8 % — AB (ref 0–5)
Eosinophils Absolute: 0.6 10*3/uL (ref 0.0–0.7)
HEMATOCRIT: 38 % — AB (ref 39.0–52.0)
Hemoglobin: 12.8 g/dL — ABNORMAL LOW (ref 13.0–17.0)
LYMPHS ABS: 1.3 10*3/uL (ref 0.7–4.0)
LYMPHS PCT: 18 % (ref 12–46)
MCH: 29.1 pg (ref 26.0–34.0)
MCHC: 33.7 g/dL (ref 30.0–36.0)
MCV: 86.4 fL (ref 78.0–100.0)
Monocytes Absolute: 0.9 10*3/uL (ref 0.1–1.0)
Monocytes Relative: 12 % (ref 3–12)
NEUTROS ABS: 4.6 10*3/uL (ref 1.7–7.7)
Neutrophils Relative %: 62 % (ref 43–77)
Platelets: 203 10*3/uL (ref 150–400)
RBC: 4.4 MIL/uL (ref 4.22–5.81)
RDW: 13 % (ref 11.5–15.5)
WBC: 7.5 10*3/uL (ref 4.0–10.5)

## 2014-01-03 LAB — BASIC METABOLIC PANEL
ANION GAP: 14 (ref 5–15)
BUN: 23 mg/dL (ref 6–23)
CHLORIDE: 99 meq/L (ref 96–112)
CO2: 25 meq/L (ref 19–32)
Calcium: 9 mg/dL (ref 8.4–10.5)
Creatinine, Ser: 0.84 mg/dL (ref 0.50–1.35)
GFR calc Af Amer: >90 mL/min (ref >90)
GFR calc non Af Amer: 81 mL/min — ABNORMAL LOW (ref >90)
GLUCOSE: 218 mg/dL — AB (ref 70–99)
POTASSIUM: 4.4 meq/L (ref 3.7–5.3)
SODIUM: 138 meq/L (ref 137–147)

## 2014-01-03 LAB — URINALYSIS, ROUTINE W REFLEX MICROSCOPIC
Bilirubin Urine: NEGATIVE
Glucose, UA: 500 mg/dL — AB
HGB URINE DIPSTICK: NEGATIVE
Ketones, ur: 15 mg/dL — AB
Leukocytes, UA: NEGATIVE
NITRITE: NEGATIVE
PROTEIN: NEGATIVE mg/dL
SPECIFIC GRAVITY, URINE: 1.034 — AB (ref 1.005–1.030)
UROBILINOGEN UA: 1 mg/dL (ref 0.0–1.0)
pH: 5 (ref 5.0–8.0)

## 2014-01-03 LAB — SURGICAL PCR SCREEN
MRSA, PCR: NEGATIVE
Staphylococcus aureus: NEGATIVE

## 2014-01-03 LAB — APTT: aPTT: 31 seconds (ref 24–37)

## 2014-01-03 LAB — PROTIME-INR
INR: 1.11 (ref 0.00–1.49)
Prothrombin Time: 14.3 seconds (ref 11.6–15.2)

## 2014-01-03 LAB — ABO/RH: ABO/RH(D): O NEG

## 2014-01-03 LAB — TYPE AND SCREEN
ABO/RH(D): O NEG
ANTIBODY SCREEN: NEGATIVE

## 2014-01-03 MED ORDER — CHLORHEXIDINE GLUCONATE 4 % EX LIQD: 60.0000 mL | Freq: Once | CUTANEOUS | Status: DC

## 2014-01-03 NOTE — Pre-Procedure Instructions (Cosign Needed)
Matthew Calderon  01/03/2014   Your procedure is scheduled on:  Thursday July 23 rd at 1008 AM  [facility] 0[facility] AM.  Call this number if you have problems the morning of surgery: [phone]   Remember:   Do not eat food or drink liquids after midnight.   Take these medicines the morning of surgery with A SIP OF WATER: Lexapro (Escitalopram), and Tylenol if needed. No diabetic meds day of surgery.  Stop Aspirin, Vitamins, Herbal medication and Nsaids(Ibuprofen,Advil, Motrin, Naproxen ) 5 days prior to surgery.   Do not wear jewelry.  Do not wear lotions, powders, or cologne. You may Not wear deodorant.   Men may shave face and neck.  Do not bring valuables to the hospital.  [facility] is not responsible for any belongings or valuables.               Contacts, dentures or bridgework may not be worn into surgery.  Leave suitcase in the car. After surgery it may be brought to your room.  For patients admitted to the hospital, discharge time is determined by your treatment team.               Patients discharged the day of surgery will not be allowed to drive home.    Special Instructions: [place] - Preparing for Surgery  Before surgery, you can play an important role.  Because skin is not sterile, your skin needs to be as free of germs as possible.  You can reduce the number of germs on you skin by washing with CHG (chlorahexidine gluconate) soap before surgery.  CHG is an antiseptic cleaner which kills germs and bonds with the skin to continue killing germs even after washing.  Please DO NOT use if you have an allergy to CHG or antibacterial soaps.  If your skin becomes reddened/irritated stop using the CHG and inform your nurse when you arrive at Short Stay.  Do not shave (including legs and underarms) for at least 48 hours prior to the first CHG shower.  You may shave your face.  Please follow these instructions carefully:   1.  Shower with  CHG Soap the night before surgery and the                                morning of Surgery.  2.  If you choose to wash your hair, wash your hair first as usual with your       normal shampoo.  3.  After you shampoo, rinse your hair and body thoroughly to remove the                      Shampoo.  4.  Use CHG as you would any other liquid soap.  You can apply chg directly       to the skin and wash gently with scrungie or a clean washcloth.  5.  Apply the CHG Soap to your body ONLY FROM THE NECK DOWN.        Do not use on open wounds or open sores.  Avoid contact with your eyes,       ears, mouth and genitals (private parts).  Wash genitals (private parts)       with your normal soap.  6.  Wash thoroughly, paying special attention to the area where your surgery  will be performed.  7.  Thoroughly rinse your body with warm water from the neck down.  8.  DO NOT shower/wash with your normal soap after using and rinsing off       the CHG Soap.  9.  Pat yourself dry with a clean towel.            10.  Wear clean pajamas.            11.  Place clean sheets on your bed the night of your first shower and do not        sleep with pets.  Day of Surgery  Do not apply any lotions/deoderants the morning of surgery.  Please wear clean clothes to the hospital/surgery center.      Please read over the following fact sheets that you were given: Pain Booklet, Coughing and Deep Breathing, Blood Transfusion Information, MRSA Information and Surgical Site Infection Prevention

## 2014-01-04 NOTE — Progress Notes (Signed)
Anesthesia Chart Review:  Patient is a 81 year old male scheduled for right THA, anterior approach on 01/13/14 by Dr. Dalldorf.   History includes former smoker, DM2, anxiety, GERD, hiatal hernia, anemia, BPH, depression, arthritis, moderate OSA by 2006 study. BMI is consistent with obesity. PCP is listed as Dr. Kohut.   EKG on 01/03/14 showed: NSR, non-specific T wave abnormality.  Preoperative CXR and labs noted.  He will get a fasting CBG on arrival.   If no acute changes then I would anticipate that he could proceed as planned.  Allison Zelenak, PA-C [facility]/Anesthesiology Phone [phone] 01/04/2014 4:00 PM   

## 2014-01-05 NOTE — H&P (Signed)
TOTAL HIP ADMISSION H&P  Patient is admitted for right total hip arthroplasty.  Subjective:  Chief Complaint: right hip pain  HPI: Matthew Calderon, 80 y.o. male, has a history of pain and functional disability in the right hip(s) due to arthritis and patient has failed non-surgical conservative treatments for greater than 12 weeks to include NSAID's and/or analgesics, flexibility and strengthening excercises, weight reduction as appropriate and activity modification.  Onset of symptoms was gradual starting 5 years ago with gradually worsening course since that time.The patient noted no past surgery on the right hip(s).  Patient currently rates pain in the right hip at 10 out of 10 with activity. Patient has night pain, worsening of pain with activity and weight bearing and pain that interfers with activities of daily living. Patient has evidence of subchondral cysts, subchondral sclerosis, periarticular osteophytes and joint space narrowing by imaging studies. This condition presents safety issues increasing the risk of falls.  There is no current active infection.  There are no active problems to display for this patient.  Past Medical History  Diagnosis Date  . Sleep apnea     no CPAP  . Post-nasal drip   . Seasonal allergies   . Pneumonia     4 years ago  . Diabetes mellitus without complication   . Anxiety   . Depression   . Prostate hyperplasia, benign localized, without urinary obstruction   . H/O hiatal hernia   . GERD (gastroesophageal reflux disease)     pepto  . Arthritis   . Anemia     takes iron pill    Past Surgical History  Procedure Laterality Date  . Tonsillectomy    . Knee surgery Bilateral 1954    knee surgery and placed in a cast for 6 weeks  . Knee surgery  1961    cartilage removed  . Leg surgery Bilateral 1974, 1975    straighten legs  . Knee arthroscopy Left 1981  . Joint replacement Left 1992    knee  . Joint replacement Right 1996    knee  . Joint  replacement Right 2009    hip  . Joint replacement Left 2005    hip  . Knee arthroscopy with patella reconstruction Right 2010  . Shoulder open rotator cuff repair Right 2010  . Back surgery  1995    discectomy  . Eye surgery Bilateral     cataracts  . Eye surgery Bilateral     glaucoma shunts  . Colonoscopy w/ biopsies and polypectomy      No prescriptions prior to admission   Allergies  Allergen Reactions  . Penicillins Hives  . Sulfa Antibiotics Hives    History  Substance Use Topics  . Smoking status: Former Smoker -- 1.00 packs/day for 18 years    Types: Cigarettes  . Smokeless tobacco: Never Used     Comment: stopped 45 years ago  . Alcohol Use: Yes     Comment: social    No family history on file.   Review of Systems  Musculoskeletal: Positive for joint pain.       Right hip  All other systems reviewed and are negative.   Objective:  Physical Exam  Constitutional: He is oriented to person, place, and time. He appears well-developed and well-nourished.  HENT:  Head: Normocephalic and atraumatic.  Eyes: Pupils are equal, round, and reactive to light.  Neck: Normal range of motion.  Cardiovascular: Normal rate and regular rhythm.   Respiratory: Effort normal.  GI: Soft.  Musculoskeletal:  His right leg is a couple of centimeters shorter than the opposite side. He has limited hip motion all of which is fairly painful. Straight leg raise causes some back pain. His knees move fairly well with no effusion on either side where he has healed incisions on both knees. Sensation and motor function are intact in his feet with palpable pulses at both ankles. There is no palpable lymphadenopathy about his groin.   Neurological: He is alert and oriented to person, place, and time.  Skin: Skin is warm and dry.  Psychiatric: He has a normal mood and affect. His behavior is normal. Judgment and thought content normal.    Vital signs in last 24 hours:    Labs:   There  is no height or weight on file to calculate BMI.   Imaging Review Plain radiographs demonstrate severe degenerative joint disease of the right hip(s). The bone quality appears to be good for age and reported activity level.  Assessment/Plan:  End stage arthritis, right hip(s)  The patient history, physical examination, clinical judgement of the provider and imaging studies are consistent with end stage degenerative joint disease of the right hip(s) and total hip arthroplasty is deemed medically necessary. The treatment options including medical management, injection therapy, arthroscopy and arthroplasty were discussed at length. The risks and benefits of total hip arthroplasty were presented and reviewed. The risks due to aseptic loosening, infection, stiffness, dislocation/subluxation,  thromboembolic complications and other imponderables were discussed.  The patient acknowledged the explanation, agreed to proceed with the plan and consent was signed. Patient is being admitted for inpatient treatment for surgery, pain control, PT, OT, prophylactic antibiotics, VTE prophylaxis, progressive ambulation and ADL's and discharge planning.The patient is planning to be discharged to skilled nursing facility

## 2014-01-12 MED ORDER — VANCOMYCIN HCL 10 G IV SOLR
1250.0000 mg | INTRAVENOUS | Status: AC
  Administered 2014-01-13: 1250 mg via INTRAVENOUS
  Filled 2014-01-12: qty 1250

## 2014-01-12 MED ORDER — TRANEXAMIC ACID 100 MG/ML IV SOLN
1000.0000 mg | INTRAVENOUS | Status: AC
  Administered 2014-01-13: 1000 mg via INTRAVENOUS
  Filled 2014-01-12: qty 10

## 2014-01-13 ENCOUNTER — Inpatient Hospital Stay (HOSPITAL_COMMUNITY): Payer: Medicare PPO

## 2014-01-13 ENCOUNTER — Inpatient Hospital Stay (HOSPITAL_COMMUNITY): Payer: Medicare PPO | Admitting: Certified Registered Nurse Anesthetist

## 2014-01-13 ENCOUNTER — Encounter (HOSPITAL_COMMUNITY): Payer: Self-pay | Admitting: Pharmacy Technician

## 2014-01-13 ENCOUNTER — Encounter (HOSPITAL_COMMUNITY): Payer: Medicare PPO | Admitting: Vascular Surgery

## 2014-01-13 ENCOUNTER — Encounter (HOSPITAL_COMMUNITY): Payer: Self-pay | Admitting: Anesthesiology

## 2014-01-13 ENCOUNTER — Encounter (HOSPITAL_COMMUNITY)
Admission: RE | Disposition: A | Discharge status: Skilled Nursing Facility | Payer: Self-pay | Source: Ambulatory Visit | Attending: Orthopaedic Surgery

## 2014-01-13 ENCOUNTER — Inpatient Hospital Stay (HOSPITAL_COMMUNITY)
Admission: RE | Admit: 2014-01-13 | Discharge: 2014-01-17 | DRG: 470 | Disposition: A | Discharge status: Skilled Nursing Facility | Payer: Medicare PPO | Source: Ambulatory Visit | Attending: Orthopaedic Surgery | Admitting: Orthopaedic Surgery

## 2014-01-13 DIAGNOSIS — M1611 Unilateral primary osteoarthritis, right hip: Secondary | ICD-10-CM

## 2014-01-13 DIAGNOSIS — K219 Gastro-esophageal reflux disease without esophagitis: Secondary | ICD-10-CM | POA: Diagnosis present

## 2014-01-13 DIAGNOSIS — Z96659 Presence of unspecified artificial knee joint: Secondary | ICD-10-CM | POA: Diagnosis not present

## 2014-01-13 DIAGNOSIS — Z87891 Personal history of nicotine dependence: Secondary | ICD-10-CM

## 2014-01-13 DIAGNOSIS — M1610 Unilateral primary osteoarthritis, unspecified hip: Secondary | ICD-10-CM | POA: Diagnosis present

## 2014-01-13 DIAGNOSIS — Z880 Allergy status to penicillin: Secondary | ICD-10-CM

## 2014-01-13 DIAGNOSIS — K449 Diaphragmatic hernia without obstruction or gangrene: Secondary | ICD-10-CM | POA: Diagnosis present

## 2014-01-13 DIAGNOSIS — Z96649 Presence of unspecified artificial hip joint: Secondary | ICD-10-CM | POA: Diagnosis not present

## 2014-01-13 DIAGNOSIS — E119 Type 2 diabetes mellitus without complications: Secondary | ICD-10-CM | POA: Diagnosis present

## 2014-01-13 DIAGNOSIS — Z882 Allergy status to sulfonamides status: Secondary | ICD-10-CM

## 2014-01-13 DIAGNOSIS — M25559 Pain in unspecified hip: Secondary | ICD-10-CM | POA: Diagnosis present

## 2014-01-13 DIAGNOSIS — M169 Osteoarthritis of hip, unspecified: Secondary | ICD-10-CM | POA: Diagnosis present

## 2014-01-13 DIAGNOSIS — N400 Enlarged prostate without lower urinary tract symptoms: Secondary | ICD-10-CM | POA: Diagnosis present

## 2014-01-13 DIAGNOSIS — G4730 Sleep apnea, unspecified: Secondary | ICD-10-CM | POA: Diagnosis present

## 2014-01-13 HISTORY — PX: TOTAL HIP ARTHROPLASTY: SHX124

## 2014-01-13 LAB — GLUCOSE, CAPILLARY
GLUCOSE-CAPILLARY: 247 mg/dL — AB (ref 70–99)
Glucose-Capillary: 259 mg/dL — ABNORMAL HIGH (ref 70–99)
Glucose-Capillary: 314 mg/dL — ABNORMAL HIGH (ref 70–99)
Glucose-Capillary: 326 mg/dL — ABNORMAL HIGH (ref 70–99)

## 2014-01-13 SURGERY — ARTHROPLASTY, HIP, TOTAL, ANTERIOR APPROACH
Anesthesia: General | Site: Hip | Laterality: Right

## 2014-01-13 MED ORDER — VANCOMYCIN HCL IN DEXTROSE 1-5 GM/200ML-% IV SOLN
1000.0000 mg | Freq: Two times a day (BID) | INTRAVENOUS | Status: AC
  Administered 2014-01-13: 1000 mg via INTRAVENOUS
  Filled 2014-01-13: qty 200

## 2014-01-13 MED ORDER — BISACODYL 5 MG PO TBEC
5.0000 mg | DELAYED_RELEASE_TABLET | Freq: Every day | ORAL | Status: DC | PRN
  Administered 2014-01-16 – 2014-01-17 (×2): 5 mg via ORAL
  Filled 2014-01-13 (×2): qty 1

## 2014-01-13 MED ORDER — ROCURONIUM BROMIDE 50 MG/5ML IV SOLN
INTRAVENOUS | Status: AC
  Filled 2014-01-13: qty 1

## 2014-01-13 MED ORDER — HYDROMORPHONE HCL PF 1 MG/ML IJ SOLN
INTRAMUSCULAR | Status: AC
  Filled 2014-01-13: qty 1

## 2014-01-13 MED ORDER — LIDOCAINE HCL (CARDIAC) 20 MG/ML IV SOLN
INTRAVENOUS | Status: AC
  Filled 2014-01-13: qty 10

## 2014-01-13 MED ORDER — FENTANYL CITRATE 0.05 MG/ML IJ SOLN
INTRAMUSCULAR | Status: DC | PRN
  Administered 2014-01-13: 50 ug via INTRAVENOUS
  Administered 2014-01-13 (×2): 100 ug via INTRAVENOUS
  Administered 2014-01-13: 50 ug via INTRAVENOUS

## 2014-01-13 MED ORDER — PROSIGHT PO TABS
2.0000 | ORAL_TABLET | Freq: Two times a day (BID) | ORAL | Status: DC
  Administered 2014-01-14 – 2014-01-17 (×7): 2 via ORAL
  Filled 2014-01-13 (×9): qty 2

## 2014-01-13 MED ORDER — ESCITALOPRAM OXALATE 20 MG PO TABS
20.0000 mg | ORAL_TABLET | Freq: Every day | ORAL | Status: DC
  Administered 2014-01-14 – 2014-01-17 (×4): 20 mg via ORAL
  Filled 2014-01-13 (×4): qty 1

## 2014-01-13 MED ORDER — LACTATED RINGERS IV SOLN
INTRAVENOUS | Status: DC | PRN
  Administered 2014-01-13 (×2): via INTRAVENOUS

## 2014-01-13 MED ORDER — MENTHOL 3 MG MT LOZG: 1.0000 | LOZENGE | OROMUCOSAL | Status: DC | PRN

## 2014-01-13 MED ORDER — HYDROMORPHONE HCL PF 1 MG/ML IJ SOLN
INTRAMUSCULAR | Status: DC | PRN
Start: 2014-01-13 — End: 2014-01-13
  Administered 2014-01-13: .25 mg via INTRAVENOUS

## 2014-01-13 MED ORDER — HYDROMORPHONE HCL PF 1 MG/ML IJ SOLN
0.5000 mg | INTRAMUSCULAR | Status: DC | PRN
  Administered 2014-01-13: 0.5 mg via INTRAVENOUS
  Administered 2014-01-14: 1 mg via INTRAVENOUS
  Administered 2014-01-16: 0.5 mg via INTRAVENOUS
  Filled 2014-01-13 (×2): qty 1

## 2014-01-13 MED ORDER — MELATONIN ER 10 MG PO TBCR: 1.0000 | EXTENDED_RELEASE_TABLET | Freq: Every day | ORAL | Status: DC

## 2014-01-13 MED ORDER — NEOSTIGMINE METHYLSULFATE 10 MG/10ML IV SOLN
INTRAVENOUS | Status: AC
  Filled 2014-01-13: qty 1

## 2014-01-13 MED ORDER — ALUM & MAG HYDROXIDE-SIMETH 200-200-20 MG/5ML PO SUSP
30.0000 mL | ORAL | Status: DC | PRN
  Administered 2014-01-15 – 2014-01-16 (×2): 30 mL via ORAL
  Filled 2014-01-13 (×2): qty 30

## 2014-01-13 MED ORDER — ZOLPIDEM TARTRATE 5 MG PO TABS
10.0000 mg | ORAL_TABLET | Freq: Every day | ORAL | Status: DC
  Administered 2014-01-13 – 2014-01-16 (×4): 10 mg via ORAL
  Filled 2014-01-13 (×4): qty 2

## 2014-01-13 MED ORDER — PHENYLEPHRINE HCL 10 MG/ML IJ SOLN
INTRAMUSCULAR | Status: DC | PRN
  Administered 2014-01-13 (×4): 100 ug via INTRAVENOUS
  Administered 2014-01-13: 200 ug via INTRAVENOUS
  Administered 2014-01-13: 100 ug via INTRAVENOUS
  Administered 2014-01-13: 200 ug via INTRAVENOUS

## 2014-01-13 MED ORDER — HYDROMORPHONE HCL PF 1 MG/ML IJ SOLN
0.2500 mg | INTRAMUSCULAR | Status: DC | PRN
  Administered 2014-01-13 (×4): 0.5 mg via INTRAVENOUS

## 2014-01-13 MED ORDER — HYDRALAZINE HCL 20 MG/ML IJ SOLN
INTRAMUSCULAR | Status: AC
  Filled 2014-01-13: qty 1

## 2014-01-13 MED ORDER — FERROUS SULFATE ER 143 (45 FE) MG PO TBCR: 1.0000 | EXTENDED_RELEASE_TABLET | Freq: Every day | ORAL | Status: DC

## 2014-01-13 MED ORDER — DEXAMETHASONE SODIUM PHOSPHATE 4 MG/ML IJ SOLN
INTRAMUSCULAR | Status: AC
  Filled 2014-01-13: qty 1

## 2014-01-13 MED ORDER — ASPIRIN EC 325 MG PO TBEC
325.0000 mg | DELAYED_RELEASE_TABLET | Freq: Two times a day (BID) | ORAL | Status: DC
  Administered 2014-01-14 – 2014-01-17 (×7): 325 mg via ORAL
  Filled 2014-01-13 (×9): qty 1

## 2014-01-13 MED ORDER — PROPOFOL 10 MG/ML IV BOLUS
INTRAVENOUS | Status: AC
  Filled 2014-01-13: qty 20

## 2014-01-13 MED ORDER — GLYCOPYRROLATE 0.2 MG/ML IJ SOLN
INTRAMUSCULAR | Status: AC
  Filled 2014-01-13: qty 3

## 2014-01-13 MED ORDER — DEXAMETHASONE SODIUM PHOSPHATE 4 MG/ML IJ SOLN
INTRAMUSCULAR | Status: DC | PRN
  Administered 2014-01-13: 4 mg via INTRAVENOUS

## 2014-01-13 MED ORDER — METFORMIN HCL ER 750 MG PO TB24
1500.0000 mg | ORAL_TABLET | Freq: Every day | ORAL | Status: DC
  Administered 2014-01-14 – 2014-01-17 (×4): 1500 mg via ORAL
  Filled 2014-01-13 (×7): qty 2

## 2014-01-13 MED ORDER — SIMVASTATIN 40 MG PO TABS
40.0000 mg | ORAL_TABLET | Freq: Every day | ORAL | Status: DC
  Administered 2014-01-13 – 2014-01-16 (×4): 40 mg via ORAL
  Filled 2014-01-13 (×5): qty 1

## 2014-01-13 MED ORDER — DOCUSATE SODIUM 100 MG PO CAPS
100.0000 mg | ORAL_CAPSULE | Freq: Two times a day (BID) | ORAL | Status: DC
  Administered 2014-01-13 – 2014-01-17 (×8): 100 mg via ORAL
  Filled 2014-01-13 (×8): qty 1

## 2014-01-13 MED ORDER — LACTATED RINGERS IV SOLN: INTRAVENOUS | Status: DC

## 2014-01-13 MED ORDER — METHOCARBAMOL 500 MG PO TABS
ORAL_TABLET | ORAL | Status: AC
  Administered 2014-01-14: 500 mg
  Filled 2014-01-13: qty 1

## 2014-01-13 MED ORDER — FERROUS SULFATE 325 (65 FE) MG PO TABS
325.0000 mg | ORAL_TABLET | Freq: Every day | ORAL | Status: DC
  Administered 2014-01-14 – 2014-01-17 (×4): 325 mg via ORAL
  Filled 2014-01-13 (×5): qty 1

## 2014-01-13 MED ORDER — ONDANSETRON HCL 4 MG/2ML IJ SOLN: 4.0000 mg | Freq: Once | INTRAMUSCULAR | Status: DC | PRN

## 2014-01-13 MED ORDER — METOCLOPRAMIDE HCL 10 MG PO TABS: 5.0000 mg | ORAL_TABLET | Freq: Three times a day (TID) | ORAL | Status: DC | PRN

## 2014-01-13 MED ORDER — CHOLECALCIFEROL 125 MCG (5000 UT) PO TABS: 1.0000 | ORAL_TABLET | ORAL | Status: DC

## 2014-01-13 MED ORDER — NALOXONE HCL 0.4 MG/ML IJ SOLN
INTRAMUSCULAR | Status: AC
  Filled 2014-01-13: qty 1

## 2014-01-13 MED ORDER — DIPHENHYDRAMINE HCL 12.5 MG/5ML PO ELIX: 12.5000 mg | ORAL_SOLUTION | ORAL | Status: DC | PRN

## 2014-01-13 MED ORDER — METOCLOPRAMIDE HCL 5 MG/ML IJ SOLN: 5.0000 mg | Freq: Three times a day (TID) | INTRAMUSCULAR | Status: DC | PRN

## 2014-01-13 MED ORDER — ROCURONIUM BROMIDE 100 MG/10ML IV SOLN
INTRAVENOUS | Status: DC | PRN
  Administered 2014-01-13: 10 mg via INTRAVENOUS
  Administered 2014-01-13: 40 mg via INTRAVENOUS

## 2014-01-13 MED ORDER — INSULIN ASPART 100 UNIT/ML ~~LOC~~ SOLN
11.0000 [IU] | Freq: Once | SUBCUTANEOUS | Status: AC
  Administered 2014-01-13: 11 [IU] via SUBCUTANEOUS

## 2014-01-13 MED ORDER — NEOSTIGMINE METHYLSULFATE 10 MG/10ML IV SOLN
INTRAVENOUS | Status: DC | PRN
  Administered 2014-01-13: 4 mg via INTRAVENOUS

## 2014-01-13 MED ORDER — ONDANSETRON HCL 4 MG/2ML IJ SOLN
INTRAMUSCULAR | Status: AC
  Filled 2014-01-13: qty 2

## 2014-01-13 MED ORDER — ACETAMINOPHEN 325 MG PO TABS
650.0000 mg | ORAL_TABLET | Freq: Four times a day (QID) | ORAL | Status: DC | PRN
  Administered 2014-01-15 – 2014-01-17 (×2): 650 mg via ORAL
  Filled 2014-01-13 (×2): qty 2

## 2014-01-13 MED ORDER — METOPROLOL TARTRATE 1 MG/ML IV SOLN
INTRAVENOUS | Status: AC
  Filled 2014-01-13: qty 5

## 2014-01-13 MED ORDER — HYDROCODONE-ACETAMINOPHEN 5-325 MG PO TABS
1.0000 | ORAL_TABLET | ORAL | Status: DC | PRN
  Administered 2014-01-13 – 2014-01-14 (×3): 2 via ORAL
  Administered 2014-01-14: 1 via ORAL
  Administered 2014-01-14 (×2): 2 via ORAL
  Administered 2014-01-14: 1 via ORAL
  Administered 2014-01-15 (×4): 2 via ORAL
  Administered 2014-01-16: 1 via ORAL
  Administered 2014-01-16 – 2014-01-17 (×3): 2 via ORAL
  Filled 2014-01-13 (×5): qty 2
  Filled 2014-01-13: qty 1
  Filled 2014-01-13 (×3): qty 2
  Filled 2014-01-13: qty 1
  Filled 2014-01-13 (×5): qty 2

## 2014-01-13 MED ORDER — GLYCOPYRROLATE 0.2 MG/ML IJ SOLN
INTRAMUSCULAR | Status: DC | PRN
  Administered 2014-01-13: 0.6 mg via INTRAVENOUS

## 2014-01-13 MED ORDER — GUAIFENESIN-DM 100-10 MG/5ML PO SYRP
10.0000 mL | ORAL_SOLUTION | ORAL | Status: DC | PRN
  Administered 2014-01-15 – 2014-01-16 (×3): 10 mL via ORAL
  Filled 2014-01-13 (×3): qty 10

## 2014-01-13 MED ORDER — DEXTROMETHORPHAN-GUAIFENESIN 10-100 MG/5ML PO LIQD
10.0000 mL | ORAL | Status: DC | PRN
  Filled 2014-01-13: qty 10

## 2014-01-13 MED ORDER — LACTATED RINGERS IV SOLN
INTRAVENOUS | Status: DC
  Administered 2014-01-13: 75 mL/h via INTRAVENOUS

## 2014-01-13 MED ORDER — ONDANSETRON HCL 4 MG/2ML IJ SOLN: 4.0000 mg | Freq: Four times a day (QID) | INTRAMUSCULAR | Status: DC | PRN

## 2014-01-13 MED ORDER — VITAMIN D3 25 MCG (1000 UNIT) PO TABS
5000.0000 [IU] | ORAL_TABLET | ORAL | Status: DC
  Administered 2014-01-17: 5000 [IU] via ORAL
  Filled 2014-01-13: qty 5

## 2014-01-13 MED ORDER — LACTATED RINGERS IV SOLN
INTRAVENOUS | Status: DC
  Administered 2014-01-13: 50 mL/h via INTRAVENOUS

## 2014-01-13 MED ORDER — FENTANYL CITRATE 0.05 MG/ML IJ SOLN
INTRAMUSCULAR | Status: AC
  Filled 2014-01-13: qty 5

## 2014-01-13 MED ORDER — LIDOCAINE HCL (CARDIAC) 20 MG/ML IV SOLN
INTRAVENOUS | Status: DC | PRN
  Administered 2014-01-13: 80 mg via INTRAVENOUS

## 2014-01-13 MED ORDER — ACETAMINOPHEN 650 MG RE SUPP: 650.0000 mg | Freq: Four times a day (QID) | RECTAL | Status: DC | PRN

## 2014-01-13 MED ORDER — 0.9 % SODIUM CHLORIDE (POUR BTL) OPTIME
TOPICAL | Status: DC | PRN
  Administered 2014-01-13: 1000 mL

## 2014-01-13 MED ORDER — NORTRIPTYLINE HCL 25 MG PO CAPS
25.0000 mg | ORAL_CAPSULE | Freq: Every day | ORAL | Status: DC
  Administered 2014-01-13 – 2014-01-16 (×4): 25 mg via ORAL
  Filled 2014-01-13 (×5): qty 1

## 2014-01-13 MED ORDER — PRESERVISION AREDS 2 PO CAPS: 2.0000 | ORAL_CAPSULE | Freq: Two times a day (BID) | ORAL | Status: DC

## 2014-01-13 MED ORDER — PROPOFOL 10 MG/ML IV BOLUS
INTRAVENOUS | Status: DC | PRN
  Administered 2014-01-13: 200 mg via INTRAVENOUS

## 2014-01-13 MED ORDER — ONDANSETRON HCL 4 MG PO TABS: 4.0000 mg | ORAL_TABLET | Freq: Four times a day (QID) | ORAL | Status: DC | PRN

## 2014-01-13 MED ORDER — METHOCARBAMOL 1000 MG/10ML IJ SOLN
500.0000 mg | Freq: Four times a day (QID) | INTRAVENOUS | Status: DC | PRN
  Filled 2014-01-13: qty 5

## 2014-01-13 MED ORDER — INSULIN ASPART 100 UNIT/ML ~~LOC~~ SOLN
0.0000 [IU] | Freq: Three times a day (TID) | SUBCUTANEOUS | Status: DC
  Administered 2014-01-13: 11 [IU] via SUBCUTANEOUS
  Administered 2014-01-14: 5 [IU] via SUBCUTANEOUS
  Administered 2014-01-14: 11 [IU] via SUBCUTANEOUS
  Administered 2014-01-14: 8 [IU] via SUBCUTANEOUS
  Administered 2014-01-15: 5 [IU] via SUBCUTANEOUS
  Administered 2014-01-15: 3 [IU] via SUBCUTANEOUS
  Administered 2014-01-15: 8 [IU] via SUBCUTANEOUS
  Administered 2014-01-16 (×2): 3 [IU] via SUBCUTANEOUS
  Administered 2014-01-16 – 2014-01-17 (×3): 5 [IU] via SUBCUTANEOUS

## 2014-01-13 MED ORDER — ONDANSETRON HCL 4 MG/2ML IJ SOLN
INTRAMUSCULAR | Status: DC | PRN
  Administered 2014-01-13: 4 mg via INTRAVENOUS

## 2014-01-13 MED ORDER — PHENYLEPHRINE HCL 10 MG/ML IJ SOLN
INTRAMUSCULAR | Status: AC
  Filled 2014-01-13: qty 1

## 2014-01-13 MED ORDER — CEFAZOLIN SODIUM-DEXTROSE 2-3 GM-% IV SOLR: 2.0000 g | INTRAVENOUS | Status: DC

## 2014-01-13 MED ORDER — PHENOL 1.4 % MT LIQD
1.0000 | OROMUCOSAL | Status: DC | PRN
  Administered 2014-01-16: 1 via OROMUCOSAL
  Filled 2014-01-13: qty 177

## 2014-01-13 MED ORDER — METHOCARBAMOL 500 MG PO TABS
500.0000 mg | ORAL_TABLET | Freq: Four times a day (QID) | ORAL | Status: DC | PRN
  Administered 2014-01-13 – 2014-01-17 (×7): 500 mg via ORAL
  Filled 2014-01-13 (×7): qty 1

## 2014-01-13 SURGICAL SUPPLY — 56 items
APL SKNCLS STERI-STRIP NONHPOA (GAUZE/BANDAGES/DRESSINGS) ×1
BENZOIN TINCTURE PRP APPL 2/3 (GAUZE/BANDAGES/DRESSINGS) ×1 IMPLANT
BLADE SAW SGTL 18X1.27X75 (BLADE) ×2 IMPLANT
BLADE SURG ROTATE 9660 (MISCELLANEOUS) ×1 IMPLANT
CAPT HIP PF MOP ×1 IMPLANT
CELLS DAT CNTRL 66122 CELL SVR (MISCELLANEOUS) ×1 IMPLANT
COVER PERINEAL POST (MISCELLANEOUS) ×2 IMPLANT
COVER SURGICAL LIGHT HANDLE (MISCELLANEOUS) ×2 IMPLANT
DRAPE C-ARM 42X72 X-RAY (DRAPES) ×2 IMPLANT
DRAPE STERI IOBAN 125X83 (DRAPES) ×2 IMPLANT
DRAPE U-SHAPE 47X51 STRL (DRAPES) ×6 IMPLANT
DRSG AQUACEL AG ADV 3.5X10 (GAUZE/BANDAGES/DRESSINGS) ×2 IMPLANT
DURAPREP 26ML APPLICATOR (WOUND CARE) ×2 IMPLANT
ELECT BLADE 4.0 EZ CLEAN MEGAD (MISCELLANEOUS) ×2
ELECT CAUTERY BLADE 6.4 (BLADE) ×2 IMPLANT
ELECT REM PT RETURN 9FT ADLT (ELECTROSURGICAL) ×2
ELECTRODE BLDE 4.0 EZ CLN MEGD (MISCELLANEOUS) IMPLANT
ELECTRODE REM PT RTRN 9FT ADLT (ELECTROSURGICAL) ×1 IMPLANT
FACESHIELD WRAPAROUND (MASK) ×6 IMPLANT
FACESHIELD WRAPAROUND OR TEAM (MASK) ×2 IMPLANT
GLOVE BIO SURGEON STRL SZ8 (GLOVE) ×7 IMPLANT
GLOVE BIOGEL PI IND STRL 6.5 (GLOVE) IMPLANT
GLOVE BIOGEL PI IND STRL 8 (GLOVE) ×1 IMPLANT
GLOVE BIOGEL PI INDICATOR 6.5 (GLOVE) ×1
GLOVE BIOGEL PI INDICATOR 8 (GLOVE) ×2
GLOVE BIOGEL PI ORTHO PRO 7.5 (GLOVE) ×1
GLOVE PI ORTHO PRO STRL 7.5 (GLOVE) IMPLANT
GLOVE SURG SS PI 6.5 STRL IVOR (GLOVE) ×1 IMPLANT
GLOVE SURG SS PI 7.5 STRL IVOR (GLOVE) ×1 IMPLANT
GOWN STRL REUS W/ TWL LRG LVL3 (GOWN DISPOSABLE) ×2 IMPLANT
GOWN STRL REUS W/ TWL XL LVL3 (GOWN DISPOSABLE) ×1 IMPLANT
GOWN STRL REUS W/TWL LRG LVL3 (GOWN DISPOSABLE) ×2
GOWN STRL REUS W/TWL XL LVL3 (GOWN DISPOSABLE) ×6
KIT BASIN OR (CUSTOM PROCEDURE TRAY) ×2 IMPLANT
KIT ROOM TURNOVER OR (KITS) ×2 IMPLANT
LINER BOOT UNIVERSAL DISP (MISCELLANEOUS) ×2 IMPLANT
MANIFOLD NEPTUNE II (INSTRUMENTS) ×2 IMPLANT
NS IRRIG 1000ML POUR BTL (IV SOLUTION) ×2 IMPLANT
PACK TOTAL JOINT (CUSTOM PROCEDURE TRAY) ×2 IMPLANT
PAD ARMBOARD 7.5X6 YLW CONV (MISCELLANEOUS) ×4 IMPLANT
RETRACTOR WND ALEXIS 18 MED (MISCELLANEOUS) ×1 IMPLANT
RTRCTR WOUND ALEXIS 18CM MED (MISCELLANEOUS) ×2
STAPLER VISISTAT 35W (STAPLE) ×1 IMPLANT
STRIP CLOSURE SKIN 1/2X4 (GAUZE/BANDAGES/DRESSINGS) ×1 IMPLANT
SUT ETHIBOND NAB CT1 #1 30IN (SUTURE) ×4 IMPLANT
SUT VIC AB 0 CT1 27 (SUTURE) ×2
SUT VIC AB 0 CT1 27XBRD ANBCTR (SUTURE) IMPLANT
SUT VIC AB 1 CT1 27 (SUTURE) ×2
SUT VIC AB 1 CT1 27XBRD ANBCTR (SUTURE) ×1 IMPLANT
SUT VIC AB 2-0 CT1 27 (SUTURE) ×2
SUT VIC AB 2-0 CT1 TAPERPNT 27 (SUTURE) ×1 IMPLANT
SUT VLOC 180 0 24IN GS25 (SUTURE) ×2 IMPLANT
TOWEL OR 17X24 6PK STRL BLUE (TOWEL DISPOSABLE) ×2 IMPLANT
TOWEL OR 17X26 10 PK STRL BLUE (TOWEL DISPOSABLE) ×4 IMPLANT
TRAY FOLEY CATH 14FR (SET/KITS/TRAYS/PACK) IMPLANT
WATER STERILE IRR 1000ML POUR (IV SOLUTION) ×2 IMPLANT

## 2014-01-13 NOTE — Progress Notes (Signed)
Orthopedic Tech Progress Note Patient Details:  Matthew Calderon [birth year][name]7/1934 [id number]  Ortho Devices Ortho Device/Splint Location: trapeze bar patient helper Ortho Device/Splint Interventions: Application Viewed order from doctor's order list  [name], [name] 01/13/2014, 9:18 PM

## 2014-01-13 NOTE — H&P (View-Only) (Signed)
Anesthesia Chart Review:  Patient is a 80 year old male scheduled for right THA, anterior approach on 01/13/14 by Dr. Jerl Santosalldorf.   History includes former smoker, DM2, anxiety, GERD, hiatal hernia, anemia, BPH, depression, arthritis, moderate OSA by 2006 study. BMI is consistent with obesity. PCP is listed as Dr. Juleen ChinaKohut.   EKG on 01/03/14 showed: NSR, non-specific T wave abnormality.  Preoperative CXR and labs noted.  He will get a fasting CBG on arrival.   If no acute changes then I would anticipate that he could proceed as planned.  Velna Ochsllison Dametria Tuzzolino, PA-C [facility]hesiology Phone [phone] 01/04/2014 4:00 PM

## 2014-01-13 NOTE — Anesthesia Postprocedure Evaluation (Signed)
Anesthesia Post Note  Patient: Matthew Calderon  Procedure(s) Performed: Procedure(s) (LRB): TOTAL HIP ARTHROPLASTY ANTERIOR APPROACH (Right)  Anesthesia type: General  Patient location: PACU  Post pain: Pain level controlled and Adequate analgesia  Post assessment: Post-op Vital signs reviewed, Patient's Cardiovascular Status Stable, Respiratory Function Stable, Patent Airway and Pain level controlled  Last Vitals:  Filed Vitals:   01/13/14 1500  BP:   Pulse: 105  Temp: 36.6 C  Resp: 15    Post vital signs: Reviewed and stable  Level of consciousness: awake, alert  and oriented  Complications: No apparent anesthesia complications

## 2014-01-13 NOTE — Anesthesia Preprocedure Evaluation (Signed)
Anesthesia Evaluation  Patient identified by MRN, date of birth, ID band Patient awake    Reviewed: Allergy & Precautions, H&P , NPO status , Patient's Chart, lab work & pertinent test results  Airway       Dental   Pulmonary sleep apnea , former smoker,          Cardiovascular     Neuro/Psych    GI/Hepatic hiatal hernia, GERD-  ,  Endo/Other  diabetes, Type 2, Oral Hypoglycemic Agents  Renal/GU      Musculoskeletal   Abdominal   Peds  Hematology  (+) anemia ,   Anesthesia Other Findings   Reproductive/Obstetrics                           Anesthesia Physical Anesthesia Plan  ASA: III  Anesthesia Plan: General   Post-op Pain Management:    Induction: Intravenous  Airway Management Planned: Oral ETT  Additional Equipment:   Intra-op Plan:   Post-operative Plan: Extubation in OR  Informed Consent: I have reviewed the patients History and Physical, chart, labs and discussed the procedure including the risks, benefits and alternatives for the proposed anesthesia with the patient or authorized representative who has indicated his/her understanding and acceptance.     Plan Discussed with: CRNA, Anesthesiologist and Surgeon  Anesthesia Plan Comments:         Anesthesia Quick Evaluation

## 2014-01-13 NOTE — Progress Notes (Signed)
Patient alert and confused to date, time and place/situation. Getting out of bed and attempting to ambulate independently. Bed alarm utilized previously and nurse tech at bedside with patient for safety. Attempts to reorient unsuccessful.

## 2014-01-13 NOTE — Interval H&P Note (Signed)
History and Physical Interval Note:  01/13/2014 9:16 AM  Matthew Calderon  has presented today for surgery, with the diagnosis of RIGHT HIP DEGENERATIVE JOINT DISEASE  The various methods of treatment have been discussed with the patient and family. After consideration of risks, benefits and other options for treatment, the patient has consented to  Procedure(s): TOTAL HIP ARTHROPLASTY ANTERIOR APPROACH (Right) as a surgical intervention .  The patient's history has been reviewed, patient examined, no change in status, stable for surgery.  I have reviewed the patient's chart and labs.  Questions were answered to the patient's satisfaction.     Buddie Marston G   

## 2014-01-13 NOTE — Op Note (Signed)
PRE-OP DIAGNOSIS:  RIGHT HIP DEGENERATIVE JOINT DISEASE POST-OP DIAGNOSIS:  same PROCEDURE: RIGHT TOTAL HIP ARTHROPLASTY ANTERIOR APPROACH ANESTHESIA:  General SURGEON:  Marcene CorningPeter Khalia Gong MD ASSISTANT:  Elodia FlorenceAndrew Nida PA-C   INDICATIONS FOR PROCEDURE:  The patient is a 80 y.o. male with a long history of a painful hip.  This has persisted despite multiple conservative measures.  The patient has persisted with pain and dysfunction making rest and activity difficult.  A total hip replacement is offered as surgical treatment.  Informed operative consent was obtained after discussion of possible complications including reaction to anesthesia, infection, neurovascular injury, dislocation, DVT, PE, and death.  The importance of the postoperative rehab program to optimize result was stressed with the patient.  SUMMARY OF FINDINGS AND PROCEDURE:  Under general anesthesia through a anterior approach an the Hana table a right THR was performed.  The patient had severe degenerative change and good bone quality.  We used DePuy components to replace the hip and these were size KHO 14 Corail femur capped with a +1.5 36mm metal hip ball.  On the acetabular side we used a size 52 Gription shell with a  plus 4 neutral polyethylene liner.  We did use a hole eliminator.  Elodia FlorenceAndrew Nida PA-C assisted throughout and was invaluable to the completion of the case in that he helped position and retract while I performed the procedure.  He also closed simultaneously to help minimize OR time.  I used fluoroscopy throughout the case to check position of implants and leg lengths and read all of these views myself.  DESCRIPTION OF PROCEDURE:  The patient was taken to the OR suite where general anesthetic was applied.  The patient was then positioned on the Hana table supine.  All bony prominences were appropriately padded.  Prep and drape was then performed in normal sterile fashion.  The patient was given vancomycin preoperative antibiotic  and an appropriate time out was performed.  We then took an anterior approach to the right hip.  Dissection was taken through adipose to the tensor fascia lata fascia.  This structure was incised longitudinally and we dissected in the intermuscular interval just medial to this muscle.  Cobra retractors were placed superior and inferior to the femoral neck superficial to the capsule.  A capsular incision was then made and the retractors were placed along the femoral neck.  Xray was brought in to get a good level for the femoral neck cut which was made with an oscillating saw and osteotome.  The femoral head was removed with a corkscrew.  The acetabulum was exposed and some labral tissues were excised. Reaming was taken to the inside wall of the pelvis and sequentially up to 1 mm smaller than the actual component.  A trial of components was done and then the aforementioned acetabular shell was placed in appropriate tilt and anteversion confirmed by fluoroscopy. The liner was placed along with the hole eliminator and attention was turned to the femur.  The leg was brought down and over into adduction and the elevator bar was used to raise the femur up gently in the wound.  The piriformis was released with care taken to preserve the obturator internus attachment and all of the posterior capsule. The femur was reamed and then broached to the appropriate size.  A trial reduction was done and the aforementioned head and neck assembly gave us the best stability in extension with external rotation.  Leg lengths were felt to be about equal by fluoroscopic exam.  The trial components were removed and the wound irrigated.  We then placed the femoral component in appropriate anteversion.  The head was applied to a dry stem neck and the hip again reduced.  It was again stable in the aforementioned position.  The would was irrigated again followed by re-approximation of anterior capsule with ethibond suture. Tensor fascia was  repaired with V-loc suture  followed by subcutaneous closure with #O and #2 undyed vicryl.  Skin was closed with subcuticular stitch and steri-strips followed by a sterile dressing.  EBL and IOF can be obtained from anesthesia records.  DISPOSITION:  The patient was extubated in the OR and taken to PACU in stable condition to be admitted to the Orthopedic Surgery for appropriate post-op care to include perioperative antibiotics and DVT prophylaxis.

## 2014-01-13 NOTE — Interval H&P Note (Deleted)
History and Physical Interval Note:  01/13/2014 9:16 AM  Matthew Calderon  has presented today for surgery, with the diagnosis of RIGHT HIP DEGENERATIVE JOINT DISEASE  The various methods of treatment have been discussed with the patient and family. After consideration of risks, benefits and other options for treatment, the patient has consented to  Procedure(s): TOTAL HIP ARTHROPLASTY ANTERIOR APPROACH (Right) as a surgical intervention .  The patient's history has been reviewed, patient examined, no change in status, stable for surgery.  I have reviewed the patient's chart and labs.  Questions were answered to the patient's satisfaction.     Ayse Mccartin G

## 2014-01-13 NOTE — Transfer of Care (Signed)
Immediate Anesthesia Transfer of Care Note  Patient: Matthew CairoDwight B Melnyk  Procedure(s) Performed: Procedure(s): TOTAL HIP ARTHROPLASTY ANTERIOR APPROACH (Right)  Patient Location: PACU  Anesthesia Type:General  Level of Consciousness: awake, alert , oriented and responds to stimulation  Airway & Oxygen Therapy: Patient Spontanous Breathing and Patient connected to face mask oxygen  Post-op Assessment: Report given to PACU RN  Post vital signs: Reviewed and stable  Complications: No apparent anesthesia complications

## 2014-01-14 ENCOUNTER — Encounter (HOSPITAL_COMMUNITY): Payer: Self-pay | Admitting: Orthopaedic Surgery

## 2014-01-14 LAB — CBC
HCT: 29 % — ABNORMAL LOW (ref 39.0–52.0)
Hemoglobin: 9.7 g/dL — ABNORMAL LOW (ref 13.0–17.0)
MCH: 28.5 pg (ref 26.0–34.0)
MCHC: 33.4 g/dL (ref 30.0–36.0)
MCV: 85.3 fL (ref 78.0–100.0)
Platelets: 199 10*3/uL (ref 150–400)
RBC: 3.4 MIL/uL — ABNORMAL LOW (ref 4.22–5.81)
RDW: 12.9 % (ref 11.5–15.5)
WBC: 9.9 10*3/uL (ref 4.0–10.5)

## 2014-01-14 LAB — BASIC METABOLIC PANEL
ANION GAP: 13 (ref 5–15)
BUN: 14 mg/dL (ref 6–23)
CHLORIDE: 97 meq/L (ref 96–112)
CO2: 23 mEq/L (ref 19–32)
Calcium: 8.4 mg/dL (ref 8.4–10.5)
Creatinine, Ser: 0.75 mg/dL (ref 0.50–1.35)
GFR, EST NON AFRICAN AMERICAN: 85 mL/min — AB (ref >90)
Glucose, Bld: 304 mg/dL — ABNORMAL HIGH (ref 70–99)
POTASSIUM: 4.2 meq/L (ref 3.7–5.3)
SODIUM: 133 meq/L — AB (ref 137–147)

## 2014-01-14 LAB — GLUCOSE, CAPILLARY
Glucose-Capillary: 332 mg/dL — ABNORMAL HIGH (ref 70–99)
Glucose-Capillary: 238 mg/dL — ABNORMAL HIGH (ref 70–99)
Glucose-Capillary: 253 mg/dL — ABNORMAL HIGH (ref 70–99)
Glucose-Capillary: 181 mg/dL — ABNORMAL HIGH (ref 70–99)

## 2014-01-14 MED ORDER — METHOCARBAMOL 500 MG PO TABS: 500.0000 mg | ORAL_TABLET | Freq: Four times a day (QID) | ORAL | Status: DC | PRN

## 2014-01-14 MED ORDER — HYDROCODONE-ACETAMINOPHEN 5-325 MG PO TABS: 1.0000 | ORAL_TABLET | ORAL | Status: DC | PRN

## 2014-01-14 MED ORDER — ASPIRIN 325 MG PO TBEC: 325.0000 mg | DELAYED_RELEASE_TABLET | Freq: Two times a day (BID) | ORAL | Status: DC

## 2014-01-14 NOTE — Progress Notes (Signed)
Utilization review completed. Majel Giel, RN, BSN. 

## 2014-01-14 NOTE — Clinical Social Work Placement (Signed)
Clinical Social Work Department CLINICAL SOCIAL WORK PLACEMENT NOTE 01/14/2014  Patient:  Zelphia CairoKELLY,Asiel B  Account Number:  [id number] Admit date:  01/13/2014  Clinical Social Worker:  Macario GoldsJESSE Kam Rahimi, LCSW  Date/time:  01/14/2014 02:30 PM  Clinical Social Work is seeking post-discharge placement for this patient at the following level of care:   SKILLED NURSING   (*CSW will update this form in Epic as items are completed)   01/14/2014  [facility]/family provided with [facility] of Clinical [facility] Work's list of facilities offering this level of care within the geographic area requested by the patient (or if unable, by the patient's family).  01/14/2014  Patient/family informed of their freedom to choose among providers that offer the needed level of care, that participate in Medicare, Medicaid or managed care program needed by the patient, have an available bed and are willing to accept the patient.  01/14/2014  Patient/family informed of [facility]' ownership interest in [facility], as well as of the fact that they are under no obligation to receive care at this facility.  PASARR submitted to EDS on  PASARR number received on   FL2 transmitted to all facilities in geographic area requested by pt/family on  01/14/2014 FL2 transmitted to all facilities within larger geographic area on   Patient informed that his/her managed care company has contracts with or will negotiate with  certain facilities, including the following:     Patient/family informed of bed offers received:  01/14/2014 [facility] Phys[facility] recommends and patient chooses bed at    [facility]  01/17/2014 Patient to be transferred to facility by  Patient and family notified of transfer on  Name of family member notified:    The following physician request were entered in Epic:   Additional  Comments: 07/24 - Patient with pre existing Pasarr

## 2014-01-14 NOTE — Discharge Summary (Addendum)
Patient ID: Matthew Calderon MRN: [id number] DOB/AGE: 1933-02-19 80 y.o.  Admit date: 01/13/2014 Discharge date: 01/17/14  Admission Diagnoses:  Principal Problem:   Degenerative joint disease (DJD) of hip Active Problems:   Diabetes   Discharge Diagnoses:  Same  Past Medical History  Diagnosis Date  . Sleep apnea     no CPAP  . Post-nasal drip   . Seasonal allergies   . Pneumonia     4 years ago  . Diabetes mellitus without complication   . Anxiety   . Depression   . Prostate hyperplasia, benign localized, without urinary obstruction   . H/O hiatal hernia   . GERD (gastroesophageal reflux disease)     pepto  . Arthritis   . Anemia     takes iron pill    Surgeries: Procedure(s): TOTAL HIP ARTHROPLASTY ANTERIOR APPROACH on 01/13/2014   Consultants:    Discharged Condition: Improved  Hospital Course: Matthew Calderon is an 80 y.o. male who was admitted 01/13/2014 for operative treatment ofDegenerative joint disease (DJD) of hip. Patient has severe unremitting pain that affects sleep, daily activities, and work/hobbies. After pre-op clearance the patient was taken to the operating room on 01/13/2014 and underwent  Procedure(s): TOTAL HIP ARTHROPLASTY ANTERIOR APPROACH.    Patient was given perioperative antibiotics: Anti-infectives   Start     Dose/Rate Route Frequency Ordered Stop   01/14/14 0600  ceFAZolin (ANCEF) IVPB 2 g/50 mL premix  Status:  Discontinued     2 g 100 mL/hr over 30 Minutes Intravenous On call to O.R. 01/13/14 1743 01/13/14 1816   01/13/14 2200  vancomycin (VANCOCIN) IVPB 1000 mg/200 mL premix     1,000 mg 200 mL/hr over 60 Minutes Intravenous Every 12 hours 01/13/14 1743 01/13/14 2254   01/13/14 0600  vancomycin (VANCOCIN) 1,250 mg in sodium chloride 0.9 % 250 mL IVPB     1,250 mg 166.7 mL/hr over 90 Minutes Intravenous On call to O.R. 01/12/14 1437 01/13/14 1127       Patient was given sequential compression devices, early ambulation, and  chemoprophylaxis to prevent DVT.  Patient benefited maximally from hospital stay and there were no complications.    Recent vital signs: Patient Vitals for the past 24 hrs:  BP Temp Temp src Pulse Resp SpO2  01/14/14 1315 114/57 mmHg 98.7 F (37.1 C) Oral 111 - 94 %  01/14/14 0600 125/53 mmHg 98.6 F (37 C) - 111 16 93 %  01/14/14 0557 123/62 mmHg 98.2 F (36.8 C) Oral 114 16 95 %  01/14/14 0400 - - - - 16 93 %  01/14/14 0140 145/67 mmHg 97.7 F (36.5 C) Oral 114 16 96 %  01/14/14 0000 - - - - 16 96 %  01/13/14 2147 146/68 mmHg 98.9 F (37.2 C) Oral 103 16 96 %  01/13/14 1748 147/74 mmHg 98.2 F (36.8 C) Oral 110 - -  01/13/14 1726 126/56 mmHg 98.2 F (36.8 C) - 99 16 99 %  01/13/14 1627 - 97.6 F (36.4 C) - - - 99 %  01/13/14 1619 123/58 mmHg - - 99 14 97 %  01/13/14 1615 - - - 102 16 98 %  01/13/14 1604 137/65 mmHg - - 108 15 97 %  01/13/14 1600 - - - 100 11 96 %  01/13/14 1549 132/68 mmHg - - 105 17 95 %  01/13/14 1545 - - - 102 14 97 %  01/13/14 1534 129/75 mmHg - - 102 16 98 %  01/13/14 1530 - - -  97 15 98 %  01/13/14 1519 133/70 mmHg - - 101 17 98 %  01/13/14 1515 - - - 99 15 99 %  01/13/14 1504 139/69 mmHg - - 105 16 99 %  01/13/14 1500 - 97.8 F (36.6 C) - 105 15 100 %  01/13/14 1449 124/79 mmHg - - 102 15 99 %  01/13/14 1445 - - - 100 17 100 %     Recent laboratory studies:  Recent Labs  01/14/14 0600  WBC 9.9  HGB 9.7*  HCT 29.0*  PLT 199  NA 133*  K 4.2  CL 97  CO2 23  BUN 14  CREATININE 0.75  GLUCOSE 304*  CALCIUM 8.4     Discharge Medications:     Medication List    STOP taking these medications       ibuprofen 200 MG tablet  Commonly known as:  ADVIL,MOTRIN      TAKE these medications       acetaminophen 500 MG tablet  Commonly known as:  TYLENOL  Take 1,000 mg by mouth 2 (two) times daily.     aspirin 325 MG EC tablet  Take 1 tablet (325 mg total) by mouth 2 (two) times daily after a meal.     Cholecalciferol 5000 UNITS  Tabs  Take 1 tablet by mouth once a week.     escitalopram 20 MG tablet  Commonly known as:  LEXAPRO  Take 20 mg by mouth daily.     Ferrous Sulfate 143 (45 FE) MG Tbcr  Take 1 tablet by mouth daily.     HYDROcodone-acetaminophen 5-325 MG per tablet  Commonly known as:  NORCO/VICODIN  Take 1-2 tablets by mouth every 4 (four) hours as needed (breakthrough pain).     Melatonin 10 MG Tbcr  Take 1 tablet by mouth at bedtime.     metFORMIN 500 MG 24 hr tablet  Commonly known as:  GLUCOPHAGE-XR  Take 1,500 mg by mouth daily with breakfast.     methocarbamol 500 MG tablet  Commonly known as:  ROBAXIN  Take 1 tablet (500 mg total) by mouth every 6 (six) hours as needed for muscle spasms.     nortriptyline 25 MG capsule  Commonly known as:  PAMELOR  Take 25 mg by mouth at bedtime.     pravastatin 80 MG tablet  Commonly known as:  PRAVACHOL  Take 80 mg by mouth daily.     PRESERVISION AREDS 2 Caps  Take 2 capsules by mouth 2 (two) times daily.     traMADol 50 MG tablet  Commonly known as:  ULTRAM  Take 50-100 mg by mouth every 4 (four) hours as needed for moderate pain.     TUSSIN DM 10-100 MG/5ML liquid  Generic drug:  dextromethorphan-guaiFENesin  Take 10 mLs by mouth every 4 (four) hours as needed for cough.     zolpidem 10 MG tablet  Commonly known as:  AMBIEN  Take 10 mg by mouth at bedtime.        Diagnostic Studies: Dg Chest 2 View  01/03/2014   CLINICAL DATA:  Preop right hip replacement  EXAM: CHEST  2 VIEW  COMPARISON:  None.  FINDINGS: The heart size and mediastinal contours are within normal limits. Both lungs are clear. The visualized skeletal structures are unremarkable.  IMPRESSION: No active cardiopulmonary disease.   Electronically Signed   By: Elige KoHetal  Patel   On: 01/03/2014 15:37   Dg Hip Operative Right  01/13/2014   CLINICAL  DATA:  Right hip arthroplasty.  EXAM: DG OPERATIVE RIGHT HIP  TECHNIQUE: A single spot fluoroscopic AP image of the right hip is  submitted.  COMPARISON:  None.  FINDINGS: Fluoroscopic image obtained intraoperatively shows normal alignment of a right hip arthroplasty.  IMPRESSION: Normal alignment of right hip arthroplasty.   Electronically Signed   By: Irish Lack M.D.   On: 01/13/2014 14:14    Disposition:       Discharge Instructions   Call MD / Call 911    Complete by:  As directed   If you experience chest pain or shortness of breath, CALL 911 and be transported to the hospital emergency room.  If you develope a fever above 101 F, pus (white drainage) or increased drainage or redness at the wound, or calf pain, call your surgeon's office.     Constipation Prevention    Complete by:  As directed   Drink plenty of fluids.  Prune juice may be helpful.  You may use a stool softener, such as Colace (over the counter) 100 mg twice a day.  Use MiraLax (over the counter) for constipation as needed.     Diet - low sodium heart healthy    Complete by:  As directed      Increase activity slowly as tolerated    Complete by:  As directed            Follow-up Information   Follow up with Velna Ochs, MD. Call in 2 weeks.   Specialty:  Orthopedic Surgery   Contact information:   [address] [place] [address] [phone]        Signed: Drema Halon 01/14/2014, 2:41 PM

## 2014-01-14 NOTE — Progress Notes (Addendum)
Subjective: 1 Day Post-Op Procedure(s) (LRB): TOTAL HIP ARTHROPLASTY ANTERIOR APPROACH (Right)  Activity level:  wbat Diet tolerance:  eating Voiding:  ok Patient reports pain as mild and moderate.    Objective: Vital signs in last 24 hours: Temp:  [97.1 F (36.2 C)-98.9 F (37.2 C)] 98.6 F (37 C) (07/24 0600) Pulse Rate:  [75-114] 111 (07/24 0600) Resp:  [9-18] 16 (07/24 0600) BP: (96-153)/(53-79) 125/53 mmHg (07/24 0600) SpO2:  [93 %-100 %] 93 % (07/24 0600)  Labs:  Recent Labs  01/14/14 0600  HGB 9.7*    Recent Labs  01/14/14 0600  WBC 9.9  RBC 3.40*  HCT 29.0*  PLT 199    Recent Labs  01/14/14 0600  NA 133*  K 4.2  CL 97  CO2 23  BUN 14  CREATININE 0.75  GLUCOSE 304*  CALCIUM 8.4   No results found for this basename: LABPT, INR,  in the last 72 hours  Physical Exam:  Neurologically intact ABD soft Neurovascular intact Sensation intact distally Intact pulses distally Dorsiflexion/Plantar flexion intact Incision: no drainage No cellulitis present Compartment soft  Assessment/Plan:  1 Day Post-Op Procedure(s) (LRB): TOTAL HIP ARTHROPLASTY ANTERIOR APPROACH (Right) Advance diet Up with therapy D/C IV fluids Discharge to SNF on Sunday to [facility] where he lives and his wife is if safe and cleared by PT. Continue on asa 325mg  BID x 4 weeks post op. Follow up in office 2 weeks post op.     Matthew Calderon, Ginger OrganNDREW PAUL 01/14/2014, 8:23 AM

## 2014-01-14 NOTE — Clinical Social Work Note (Signed)
Clinical Social Work Department BRIEF PSYCHOSOCIAL ASSESSMENT 01/14/2014  Patient:  Matthew Calderon,Matthew Calderon     Account Number:  [id number]     Admit date:  01/13/2014  Clinical Social Worker:  Verl BlalockSCINTO,JESSE, LCSW  Date/Time:  01/14/2014 02:30 PM  Referred by:  Physician  Date Referred:  01/14/2014 Referred for  SNF Placement   Other Referral:   Interview type:  Patient Other interview type:   Spoke with patient stepdaughter, Armstead PeaksSally Calderon, over the phone with patient permission    PSYCHOSOCIAL DATA Living Status:  FACILITY [facility] Level of care:  Assisted Living Primary support name:  Armstead PeaksBrown,Matthew  [phone] Primary support relationship to patient:  CHILD, ADULT Degree of support available:   Strong    CURRENT CONCERNS Current Concerns  Post-Acute Placement   Other Concerns:    SOCIAL WORK ASSESSMENT / PLAN Clinical Social Worker spoke with patient at bedside to offer support and discuss patient needs at discharge. Patient states that [facility] and he has bought an independent apartment to move into following his SNF stay.  [facility].  CSW and/or facility unable to obtain [employer] Insurance Authorization over the weekend for placement.  CSW offered private pay as well as the ability to choose another facility, however patient and patient family remain adamant about [facility] with patient wife.  CSW explained risk involved with the possibility of insurance not covering hospital stay due to delayed facility admission time - patient step daughter verbalized understanding.  CSW remains available for support and will facilitate patient discharge needs once insurance approval obtained on Monday (01/17/2014).   Assessment/plan status:  Psychosocial Support/Ongoing Assessment of Needs Other assessment/ plan:   Information/referral to community resources:    Visual merchandiserClinical Social Worker offered patient and family the ability to choose alternative facility and/or privately pay [facility] while awaiting [employer] Authorization - neither option appreciated by family    PATIENT'S/FAMILY'S RESPONSE TO PLAN OF CARE: Patient alert and oriented x3 laying in bed.  Patient with periods of confusion throughout the day, however did provide permission to contact his stepdaughter, Armstead PeaksSally Calderon.  Patient with good family support and ability to remain safely out of the hospital following discharge with adequate resources.  Patient and family understanding of CSW role and appreciative of support and concern.

## 2014-01-14 NOTE — Progress Notes (Signed)
Inpatient Diabetes Program Recommendations  AACE/ADA: New Consensus Statement on Inpatient Glycemic Control (2013)  Target Ranges:  Prepandial:   less than 140 mg/dL      Peak postprandial:   less than 180 mg/dL (1-2 hours)      Critically ill patients:  140 - 180 mg/dL   Reason for Visit: Results for Zelphia CairoKELLY, Darus B (MRN [id number]) as of 01/14/2014 13:16  Ref. Range 01/13/2014 13:28 01/13/2014 17:51 01/13/2014 21:25 01/14/2014 06:18 01/14/2014 11:22  Glucose-Capillary Latest Range: 70-99 mg/dL 409259 (H) 811314 (H) 914326 (H) 332 (H) 238 (H)   Diabetes history: Per problem list "Diabetes" Outpatient Diabetes medications: Metformin XR 1500 mg daily Current orders for Inpatient glycemic control:  Novolog moderate tid with meals  Note that patient received Decadron 4mg  x1 yesterday which may have contributed to elevated CBG's today.  If CBG's remain greater than 150 mg/dL, consider adding basal insulin such as Levemir 15 units daily.  Thanks, Beryl MeagerJenny Davinder Haff, RN, BC-ADM Inpatient Diabetes Coordinator Pager [phone]

## 2014-01-14 NOTE — Evaluation (Signed)
Occupational Therapy Evaluation and Discharge Patient Details Name: Matthew Calderon MRN: [id number] DOB: [birth year] Today's Date: 01/14/2014    History of Present Illness right total hip direct anterior   Clinical Impression   This 80 yo male admitted and underwent above presents to acute OT with decreased mobility, decreased balance, increased pain, decreased ROM RLE, and cognitive issues post surgery all affecting pt's ability to care for himself at an Independent level he was at pta. Pt will benefit from follow up OT at SNF, acute OT will sign off.    Follow Up Recommendations  SNF    Equipment Recommendations   (TBD at next venue)       Precautions / Restrictions Precautions Precautions: Fall Restrictions Weight Bearing Restrictions: No RLE Weight Bearing: Weight bearing as tolerated      Mobility Bed Mobility Overal bed mobility: Needs Assistance Bed Mobility: Supine to Sit     Supine to sit: Min assist;HOB elevated     General bed mobility comments: Min A for LE and VCs to sequence steps  Transfers Overall transfer level: Needs assistance Equipment used: Rolling walker (2 wheeled) Transfers: Sit to/from Stand Sit to Stand: Min assist         General transfer comment: Vcs for safe hand placement    Balance Overall balance assessment: Needs assistance Sitting-balance support: No upper extremity supported;Feet supported Sitting balance-Leahy Scale: Fair     Standing balance support: Bilateral upper extremity supported Standing balance-Leahy Scale: Poor                              ADL Overall ADL's : Needs assistance/impaired Eating/Feeding: Independent;Sitting   Grooming: Set up;Sitting   Upper Body Bathing: Set up;Sitting   Lower Body Bathing: Moderate assistance;Sit to/from stand   Upper Body Dressing : Minimal assistance;Sitting   Lower Body Dressing: Maximal assistance;Sit to/from stand   Toilet Transfer: Minimal  assistance;Ambulation;RW (bed>to couch to sit in recliner behind him)   Toileting- Architect and Hygiene: Minimal assistance;Sit to/from stand                         Pertinent Vitals/Pain 4/10 RLE; with activity; cold applied and pt repositioned--pre-medicated     Hand Dominance Right   Extremity/Trunk Assessment Upper Extremity Assessment Upper Extremity Assessment: Overall WFL for tasks assessed   Lower Extremity Assessment Lower Extremity Assessment: Defer to PT evaluation       Communication Communication Communication: No difficulties   Cognition Arousal/Alertness: Awake/alert Behavior During Therapy: WFL for tasks assessed/performed Overall Cognitive Status: Impaired/Different from baseline Area of Impairment: Orientation Orientation Level:  (varies)                            Home Living Family/patient expects to be discharged to:: Skilled nursing facility                                        Prior Functioning/Environment Level of Independence: Independent             OT Diagnosis: Generalized weakness;Cognitive deficits;Acute pain   OT Problem List: Decreased strength;Decreased range of motion;Decreased knowledge of use of DME or AE;Decreased safety awareness;Impaired balance (sitting and/or standing);Decreased cognition;Pain      OT Goals(Current goals can be found in the care  plan section) Acute Rehab OT Goals Patient Stated Goal: to rehab then home  OT Frequency:                End of Session Equipment Utilized During Treatment: Gait belt;Rolling walker Nurse Communication: Mobility status (chair alarm placed; informed NT as well)  Activity Tolerance: Patient tolerated treatment well Patient left: in chair;with call bell/phone within reach;with family/visitor present;with chair alarm set   Time: [id number] OT Time Calculation (min): 36 min Charges:  OT General Charges $OT Visit: 1  Procedure OT Evaluation $Initial OT Evaluation Tier I: 1 Procedure OT Treatments $Self Care/Home Management : 23-37 mins  Evette GeorgesLeonard, Omri Bertran Eva [phone] 01/14/2014, 10:09 AM

## 2014-01-14 NOTE — Evaluation (Signed)
Physical Therapy Evaluation Patient Details Name: Matthew Calderon MRN: [id number] DOB: [birth year] Today's Date: 01/14/2014   History of Present Illness  right total hip direct anterior  Clinical Impression  On PT arrival chair alarm sounding and pt attempting to get up by himself.  Pt reoriented to use of call button to call for A.  Feel as pt's cognition clears he will make good progress with mobility.  Will continue to follow while on acute.      Follow Up Recommendations SNF    Equipment Recommendations   (TBD)    Recommendations for Other Services       Precautions / Restrictions Precautions Precautions: Fall Restrictions Weight Bearing Restrictions: Yes RLE Weight Bearing: Weight bearing as tolerated      Mobility  Bed Mobility Overal bed mobility: Needs Assistance Bed Mobility: Supine to Sit     Supine to sit: Min assist;HOB elevated     General bed mobility comments: Min A for LE and VCs to sequence steps  Transfers Overall transfer level: Needs assistance Equipment used: Rolling walker (2 wheeled) Transfers: Sit to/from Stand Sit to Stand: Min assist         General transfer comment: cues for use of UEs and getting closer prior to sitting.    Ambulation/Gait Ambulation/Gait assistance: Min guard Ambulation Distance (Feet): 25 Feet Assistive device: Rolling walker (2 wheeled) Gait Pattern/deviations: Step-to pattern;Decreased step length - left;Decreased stance time - right     General Gait Details: pt generally usnteady and needs cues for safe use of RW and attending to task.    Stairs            Wheelchair Mobility    Modified Rankin (Stroke Patients Only)       Balance Overall balance assessment: Needs assistance Sitting-balance support: No upper extremity supported;Feet supported Sitting balance-Leahy Scale: Fair     Standing balance support: Bilateral upper extremity supported Standing balance-Leahy Scale: Poor                               Pertinent Vitals/Pain "Sort of."  When asked about pain.  Would not rate.  Premedicated.      Home Living Family/patient expects to be discharged to:: Skilled nursing facility                      Prior Function Level of Independence: Independent               Hand Dominance   Dominant Hand: Right    Extremity/Trunk Assessment   Upper Extremity Assessment: Defer to OT evaluation           Lower Extremity Assessment: RLE deficits/detail RLE Deficits / Details: Post-op weakness.      Cervical / Trunk Assessment: Normal  Communication   Communication: No difficulties  Cognition Arousal/Alertness: Awake/alert Behavior During Therapy: WFL for tasks assessed/performed Overall Cognitive Status: Impaired/Different from baseline Area of Impairment: Orientation;Attention;Memory;Safety/judgement;Awareness;Following commands;Problem solving Orientation Level: Disoriented to;Time (Increased time to answer other orientation questions.  ) Current Attention Level: Selective Memory: Decreased short-term memory;Decreased recall of precautions Following Commands: Follows multi-step commands inconsistently;Follows one step commands with increased time Safety/Judgement: Decreased awareness of safety;Decreased awareness of deficits Awareness: Emergent;Anticipatory Problem Solving: Slow processing;Difficulty sequencing;Requires verbal cues;Requires tactile cues General Comments: pt with delayed responses to questions and often needs questions and cueing repeated.      General Comments      Exercises  Total Joint Exercises Ankle Circles/Pumps: AROM;Both;10 reps Long Arc Quad: AROM;Right;10 reps      Assessment/Plan    PT Assessment Patient needs continued PT services  PT Diagnosis Abnormality of gait;Acute pain   PT Problem List Decreased strength;Decreased activity tolerance;Decreased balance;Decreased mobility;Decreased cognition;Decreased  knowledge of use of DME;Pain  PT Treatment Interventions DME instruction;Gait training;Stair training;Functional mobility training;Therapeutic activities;Therapeutic exercise;Balance training;Cognitive remediation;Patient/family education   PT Goals (Current goals can be found in the Care Plan section) Acute Rehab PT Goals Patient Stated Goal: to rehab then home PT Goal Formulation: With patient Time For Goal Achievement: 01/28/14 Potential to Achieve Goals: Good    Frequency 7X/week   Barriers to discharge        Co-evaluation               End of Session Equipment Utilized During Treatment: Gait belt Activity Tolerance: Patient tolerated treatment well Patient left: in chair;with call bell/phone within reach;with chair alarm set Nurse Communication: Mobility status         Time: 1035-1100 PT Time Calculation (min): 25 min   Charges:   PT Evaluation $Initial PT Evaluation Tier I: 1 Procedure PT Treatments $Gait Training: 8-22 mins $Therapeutic Exercise: 8-22 mins   PT G CodesSunny Calderon:          Matthew Calderon F, [place]PT [phone] 01/14/2014, 11:57 AM

## 2014-01-15 LAB — CBC
HEMATOCRIT: 25.9 % — AB (ref 39.0–52.0)
Hemoglobin: 8.7 g/dL — ABNORMAL LOW (ref 13.0–17.0)
MCH: 29 pg (ref 26.0–34.0)
MCHC: 33.6 g/dL (ref 30.0–36.0)
MCV: 86.3 fL (ref 78.0–100.0)
PLATELETS: 156 10*3/uL (ref 150–400)
RBC: 3 MIL/uL — AB (ref 4.22–5.81)
RDW: 13 % (ref 11.5–15.5)
WBC: 7.6 10*3/uL (ref 4.0–10.5)

## 2014-01-15 LAB — GLUCOSE, CAPILLARY
GLUCOSE-CAPILLARY: 171 mg/dL — AB (ref 70–99)
Glucose-Capillary: 231 mg/dL — ABNORMAL HIGH (ref 70–99)
Glucose-Capillary: 253 mg/dL — ABNORMAL HIGH (ref 70–99)
Glucose-Capillary: 166 mg/dL — ABNORMAL HIGH (ref 70–99)

## 2014-01-15 NOTE — Plan of Care (Signed)
Problem: Consults Goal: Diagnosis- Total Joint Replacement Outcome: Completed/Met Date Met:  01/15/14 Primary Total Hip Right

## 2014-01-15 NOTE — Progress Notes (Signed)
Subjective: 2 Days Post-Op Procedure(s) (LRB): TOTAL HIP ARTHROPLASTY ANTERIOR APPROACH (Right)  Activity level:  wbat Diet tolerance:  eating Voiding:  ok Patient reports pain as mild and moderate.    Objective: Vital signs in last 24 hours: Temp:  [98.7 F (37.1 C)-99.1 F (37.3 C)] 98.7 F (37.1 C) (07/25 0549) Pulse Rate:  [111-118] 118 (07/25 0549) Resp:  [18] 18 (07/25 0549) BP: (114-127)/(57-73) 127/73 mmHg (07/25 0549) SpO2:  [94 %-96 %] 96 % (07/25 0549)  Labs:  Recent Labs  01/14/14 0600 01/15/14 0458  HGB 9.7* 8.7*    Recent Labs  01/14/14 0600 01/15/14 0458  WBC 9.9 7.6  RBC 3.40* 3.00*  HCT 29.0* 25.9*  PLT 199 156    Recent Labs  01/14/14 0600  NA 133*  K 4.2  CL 97  CO2 23  BUN 14  CREATININE 0.75  GLUCOSE 304*  CALCIUM 8.4   No results found for this basename: LABPT, INR,  in the last 72 hours  Physical Exam:  Neurologically intact ABD soft Neurovascular intact Sensation intact distally Intact pulses distally Dorsiflexion/Plantar flexion intact Incision: no drainage No cellulitis present Compartment soft  Assessment/Plan:  2 Days Post-Op Procedure(s) (LRB): TOTAL HIP ARTHROPLASTY ANTERIOR APPROACH (Right) Continue pathway care. D/C  on Monday now (due to insurance barriers) to [facility] where [facility] lives and his wife is if safe and cleared by PT. Continue on asa 325mg  BID x 4 weeks post op. Follow up in office 2 weeks post op.     Beatrix Breece A. 01/15/2014, 8:17 AM

## 2014-01-15 NOTE — Care Management Note (Signed)
CARE MANAGEMENT NOTE 01/15/2014  Patient:  Matthew Calderon,Matthew Calderon   Account Number:  [id number]  Date Initiated:  01/15/2014  Documentation initiated by:  Vance PeperBRADY,Karlisa Gaubert  Subjective/Objective Assessment:   80 yr old male s/p right total hip arthroplasty.     Action/Plan:   [facility] W[facility] to [facility] where his wife is. Social worker is aware. No Case manager needs at this time.   Anticipated DC Date:  01/15/2014   Anticipated DC Plan:  SKILLED NURSING FACILITY  In-house referral  Clinical Social Worker      DC Planning Services  CM consult      Choice offered to / List presented to:             Status of service:  Completed, signed off Medicare Important Message given?  YES (If response is "NO", the following Medicare IM given date fields will be blank) Date Medicare IM given:  01/15/2014 Medicare IM given by:  Vance PeperBRADY,Lamario Mani Date Additional Medicare IM given:   Additional Medicare IM given by:    Discharge Disposition:  SKILLED NURSING FACILITY  Per UR Regulation:  Reviewed for med. necessity/level of care/duration of stay  If discussed at Long Length of Stay Meetings, dates discussed:    Comments:

## 2014-01-15 NOTE — Progress Notes (Signed)
Physical Therapy Treatment Patient Details Name: Matthew Calderon MRN: [id number] DOB: October 05, 1932 Today's Date: 01/15/2014    History of Present Illness right total hip direct anterior    PT Comments    Patient continues to be limited by fatigue and some confusion. Requires constant redirection to task. Awaiting SNF at this time  Follow Up Recommendations  SNF     Equipment Recommendations       Recommendations for Other Services       Precautions / Restrictions Restrictions Weight Bearing Restrictions: Yes RLE Weight Bearing: Weight bearing as tolerated    Mobility  Bed Mobility Overal bed mobility: Needs Assistance       Supine to sit: Min assist;HOB elevated     General bed mobility comments: Min A for LE and VCs to sequence steps  Transfers Overall transfer level: Needs assistance Equipment used: Rolling walker (2 wheeled)   Sit to Stand: Min assist         General transfer comment: cues for use of UEs and getting closer prior to sitting.  A for power up into standing and to control descent as patient tends to fall back onto recliner  Ambulation/Gait Ambulation/Gait assistance: Min guard Ambulation Distance (Feet): 50 Feet Assistive device: Rolling walker (2 wheeled) Gait Pattern/deviations: Step-to pattern;Decreased step length - right;Decreased step length - left     General Gait Details: pt generally usnteady and needs cues for safe use of RW and attending to task.     Stairs            Wheelchair Mobility    Modified Rankin (Stroke Patients Only)       Balance                                    Cognition Arousal/Alertness: Awake/alert Behavior During Therapy: WFL for tasks assessed/performed   Area of Impairment: Orientation;Attention;Memory;Safety/judgement;Awareness;Following commands;Problem solving     Memory: Decreased short-term memory;Decreased recall of precautions Following Commands: Follows multi-step  commands inconsistently;Follows one step commands with increased time Safety/Judgement: Decreased awareness of safety;Decreased awareness of deficits   Problem Solving: Slow processing;Difficulty sequencing;Requires verbal cues;Requires tactile cues General Comments: pt with delayed responses to questions and often needs questions and cueing repeated.      Exercises Total Joint Exercises Ankle Circles/Pumps: AROM;Both;10 reps Heel Slides: AAROM;Right;10 reps Hip ABduction/ADduction: AAROM Straight Leg Raises: Right;10 reps Long Arc Quad: AROM;Right;10 reps    General Comments        Pertinent Vitals/Pain no apparent distress     Home Living                      Prior Function            PT Goals (current goals can now be found in the care plan section) Progress towards PT goals: Progressing toward goals    Frequency  7X/week    PT Plan Current plan remains appropriate    Co-evaluation             End of Session Equipment Utilized During Treatment: Gait belt Activity Tolerance: Patient tolerated treatment well Patient left: in chair;with call bell/phone within reach;with chair alarm set     Time: [phone] PT Time Calculation (min): 27 min  Charges:  $Gait Training: 8-22 mins $Therapeutic Exercise: 8-22 mins  G Codes:      Fredrich Birks 01/15/2014, 10:04 AM 01/15/2014 Fredrich Birks PTA [phone] pager [phone] office

## 2014-01-16 LAB — GLUCOSE, CAPILLARY
GLUCOSE-CAPILLARY: 167 mg/dL — AB (ref 70–99)
Glucose-Capillary: 195 mg/dL — ABNORMAL HIGH (ref 70–99)
Glucose-Capillary: 233 mg/dL — ABNORMAL HIGH (ref 70–99)
Glucose-Capillary: 185 mg/dL — ABNORMAL HIGH (ref 70–99)

## 2014-01-16 LAB — CBC
HEMATOCRIT: 27.2 % — AB (ref 39.0–52.0)
Hemoglobin: 9.3 g/dL — ABNORMAL LOW (ref 13.0–17.0)
MCH: 29.6 pg (ref 26.0–34.0)
MCHC: 34.2 g/dL (ref 30.0–36.0)
MCV: 86.6 fL (ref 78.0–100.0)
Platelets: 175 10*3/uL (ref 150–400)
RBC: 3.14 MIL/uL — ABNORMAL LOW (ref 4.22–5.81)
RDW: 13.1 % (ref 11.5–15.5)
WBC: 7.5 10*3/uL (ref 4.0–10.5)

## 2014-01-16 LAB — URINALYSIS, ROUTINE W REFLEX MICROSCOPIC
BILIRUBIN URINE: NEGATIVE
GLUCOSE, UA: 500 mg/dL — AB
KETONES UR: 40 mg/dL — AB
Leukocytes, UA: NEGATIVE
NITRITE: NEGATIVE
Protein, ur: NEGATIVE mg/dL
Specific Gravity, Urine: <1.005 — ABNORMAL LOW (ref 1.005–1.030)
Urobilinogen, UA: 0.2 mg/dL (ref 0.0–1.0)
pH: 5.5 (ref 5.0–8.0)

## 2014-01-16 LAB — URINE MICROSCOPIC-ADD ON

## 2014-01-16 NOTE — Progress Notes (Signed)
Patient has been experiencing intermittent voiding incontinence.  Patient also required feeding assistance for lunch and dinner.

## 2014-01-16 NOTE — Progress Notes (Signed)
Physical Therapy Treatment Patient Details Name: Matthew CairoDwight B Calderon MRN: [id number] DOB: 30-Oct-1932 Today's Date: 01/16/2014    History of Present Illness right total hip direct anterior    PT Comments    Patient is progressing slowly towards physical therapy goals, ambulating up to 50 feet with min guard and a rolling walker. Requires frequent cues for instruction and re-direction with tasks. Tolerated therapeutic exercises well. Patient will continue to benefit from skilled physical therapy services to further improve independence with functional mobility.    Follow Up Recommendations  SNF     Equipment Recommendations   (TBD)    Recommendations for Other Services       Precautions / Restrictions Precautions Precautions: Fall Restrictions Weight Bearing Restrictions: Yes RLE Weight Bearing: Weight bearing as tolerated    Mobility  Bed Mobility                  Transfers Overall transfer level: Needs assistance Equipment used: Rolling walker (2 wheeled) Transfers: Sit to/from Stand Sit to Stand: Min assist         General transfer comment: Min assist for boost from recliner. Correctly places hands on stable surface. Fair stability upon standing with rolling walker.  Ambulation/Gait Ambulation/Gait assistance: Min guard Ambulation Distance (Feet): 50 Feet Assistive device: Rolling walker (2 wheeled) Gait Pattern/deviations: Step-to pattern;Decreased step length - left;Decreased stance time - right;Antalgic   Gait velocity interpretation: Below normal speed for age/gender General Gait Details: Good control of rolling walker however unaware of environment and runs into objects if not cued. Educated on upright posture and forward gaze. Required several standing rest breaks to complete distance.   Stairs            Wheelchair Mobility    Modified Rankin (Stroke Patients Only)       Balance                                     Cognition Arousal/Alertness: Awake/alert Behavior During Therapy: WFL for tasks assessed/performed Overall Cognitive Status: Impaired/Different from baseline Area of Impairment: Attention;Memory;Safety/judgement;Awareness;Following commands;Problem solving   Current Attention Level: Selective Memory: Decreased short-term memory Following Commands: Follows multi-step commands inconsistently;Follows one step commands with increased time   Awareness: Emergent;Anticipatory Problem Solving: Slow processing;Difficulty sequencing;Requires verbal cues General Comments: Delayed responses to questions and often needs questions and cueing repeated.      Exercises Total Joint Exercises Ankle Circles/Pumps: AROM;Both;10 reps;Seated Quad Sets: Strengthening;Both;10 reps;Seated Long Arc Quad: AROM;Right;10 reps;Seated    General Comments General comments (skin integrity, edema, etc.): Pt with difficulty maintaining attention and needs re-directed       Pertinent Vitals/Pain 10/10 pain in right hip Nurse notified Patient repositioned in chair for comfort.     Home Living                      Prior Function            PT Goals (current goals can now be found in the care plan section) Acute Rehab PT Goals PT Goal Formulation: With patient Time For Goal Achievement: 01/28/14 Potential to Achieve Goals: Good Progress towards PT goals: Progressing toward goals    Frequency  7X/week    PT Plan Current plan remains appropriate    Co-evaluation             End of Session   Activity Tolerance: Patient tolerated treatment well  Patient left: in chair;with call bell/phone within reach;with chair alarm set     Time: 6045-4098 PT Time Calculation (min): 19 min  Charges:  $Gait Training: 8-22 mins                    G Codes:      Matthew Calderon, [place] [phone]  Berton Mount 01/16/2014, 2:30 PM
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
## 2014-01-17 LAB — URINE CULTURE

## 2014-01-17 LAB — GLUCOSE, CAPILLARY
GLUCOSE-CAPILLARY: 228 mg/dL — AB (ref 70–99)
Glucose-Capillary: 232 mg/dL — ABNORMAL HIGH (ref 70–99)

## 2014-01-17 NOTE — Progress Notes (Signed)
Physical Therapy Treatment Patient Details Name: Zelphia CairoDwight B Koors MRN: [id number] DOB: 28-Sep-1932 Today's Date: 01/17/2014    History of Present Illness right total hip direct anterior    PT Comments    Entered room as chair alarm going off and patient attempting to get out of the recliner with the leg rest still in an up position. Patient educated on safety. Patient remains confused and disoriented. Planning for SNF later today  Follow Up Recommendations  SNF     Equipment Recommendations       Recommendations for Other Services       Precautions / Restrictions Precautions Precautions: Fall Restrictions RLE Weight Bearing: Weight bearing as tolerated    Mobility  Bed Mobility Overal bed mobility: Needs Assistance Bed Mobility: Sit to Supine       Sit to supine: Min assist   General bed mobility comments: Min A for LE and VCs to sequence steps  Transfers Overall transfer level: Needs assistance Equipment used: Rolling walker (2 wheeled) Transfers: Sit to/from Stand Sit to Stand: Min guard         General transfer comment: Max cues for safety and correct technique  Ambulation/Gait Ambulation/Gait assistance: Min guard Ambulation Distance (Feet): 80 Feet Assistive device: Rolling walker (2 wheeled) Gait Pattern/deviations: Step-through pattern;Decreased stride length     General Gait Details: Good control of rolling walker however unaware of environment and runs into objects if not cued. Educated on upright posture and forward gaze.    Stairs            Wheelchair Mobility    Modified Rankin (Stroke Patients Only)       Balance                                    Cognition Arousal/Alertness: Awake/alert Behavior During Therapy: WFL for tasks assessed/performed Overall Cognitive Status: Impaired/Different from baseline Area of Impairment: Attention;Memory;Safety/judgement;Awareness;Following commands;Problem  solving Orientation Level: Disoriented to;Time;Place Current Attention Level: Selective Memory: Decreased short-term memory Following Commands: Follows multi-step commands inconsistently;Follows one step commands with increased time Safety/Judgement: Decreased awareness of safety;Decreased awareness of deficits Awareness: Emergent;Anticipatory Problem Solving: Slow processing;Difficulty sequencing;Requires verbal cues      Exercises      General Comments        Pertinent Vitals/Pain no apparent distress     Home Living                      Prior Function            PT Goals (current goals can now be found in the care plan section) Progress towards PT goals: Progressing toward goals    Frequency  7X/week    PT Plan Current plan remains appropriate    Co-evaluation             End of Session Equipment Utilized During Treatment: Gait belt Activity Tolerance: Patient tolerated treatment well Patient left: in bed;with call bell/phone within reach     Time: 1230-1245 PT Time Calculation (min): 15 min  Charges:  $Gait Training: 8-22 mins                    G Codes:      Fredrich BirksRobinette, Sebastion Jun Elizabeth 01/17/2014, 12:54 PM 01/17/2014 Fredrich Birksobinette, Mayuri Staples Elizabeth PTA [phone] pager [phone] office

## 2014-01-17 NOTE — Progress Notes (Signed)
Subjective: 4 Days Post-Op Procedure(s) (LRB): TOTAL HIP ARTHROPLASTY ANTERIOR APPROACH (Right)  Activity level:  wbat Diet tolerance:  well Voiding:  ok Patient reports pain as mild.    Objective: Vital signs in last 24 hours: Temp:  [98.2 F (36.8 C)-99 F (37.2 C)] 98.5 F (36.9 C) (07/27 0626) Pulse Rate:  [114-121] 121 (07/27 0626) Resp:  [16-18] 18 (07/27 0759) BP: (118-149)/(58-74) 149/74 mmHg (07/27 0626) SpO2:  [94 %-97 %] 94 % (07/27 0626)  Labs:  Recent Labs  01/15/14 0458 01/16/14 0445  HGB 8.7* 9.3*    Recent Labs  01/15/14 0458 01/16/14 0445  WBC 7.6 7.5  RBC 3.00* 3.14*  HCT 25.9* 27.2*  PLT 156 175   No results found for this basename: NA, K, CL, CO2, BUN, CREATININE, GLUCOSE, CALCIUM,  in the last 72 hours No results found for this basename: LABPT, INR,  in the last 72 hours  Physical Exam:  Neurologically intact ABD soft Neurovascular intact Sensation intact distally Intact pulses distally Dorsiflexion/Plantar flexion intact Incision: dressing C/D/I No cellulitis present Compartment soft  Assessment/Plan:  4 Days Post-Op Procedure(s) (LRB): TOTAL HIP ARTHROPLASTY ANTERIOR APPROACH (Right) Up with therapy Discharge to SNF today to [facility]. Continue on ASA 325mg  BID x 4 weeks post op for DVT prevention. Follow up in office 2 weeks post op.     Kvon Mcilhenny, Ginger OrganNDREW PAUL 01/17/2014, 8:30 AM

## 2015-02-07 DIAGNOSIS — E118 Type 2 diabetes mellitus with unspecified complications: Secondary | ICD-10-CM | POA: Diagnosis not present

## 2015-02-14 DIAGNOSIS — E118 Type 2 diabetes mellitus with unspecified complications: Secondary | ICD-10-CM | POA: Diagnosis not present

## 2015-02-14 DIAGNOSIS — N400 Enlarged prostate without lower urinary tract symptoms: Secondary | ICD-10-CM | POA: Diagnosis not present

## 2015-02-14 DIAGNOSIS — R52 Pain, unspecified: Secondary | ICD-10-CM | POA: Diagnosis not present

## 2015-04-17 DIAGNOSIS — M79673 Pain in unspecified foot: Secondary | ICD-10-CM | POA: Diagnosis not present

## 2015-04-17 DIAGNOSIS — N400 Enlarged prostate without lower urinary tract symptoms: Secondary | ICD-10-CM | POA: Diagnosis not present

## 2015-04-17 DIAGNOSIS — E118 Type 2 diabetes mellitus with unspecified complications: Secondary | ICD-10-CM | POA: Diagnosis not present

## 2015-04-19 DIAGNOSIS — H401113 Primary open-angle glaucoma, right eye, severe stage: Secondary | ICD-10-CM | POA: Diagnosis not present

## 2015-04-19 DIAGNOSIS — H401123 Primary open-angle glaucoma, left eye, severe stage: Secondary | ICD-10-CM | POA: Diagnosis not present

## 2017-01-15 ENCOUNTER — Emergency Department (HOSPITAL_COMMUNITY)
Admission: EM | Admit: 2017-01-15 | Discharge: 2017-01-15 | Disposition: A | Discharge status: Home / Self Care | Payer: Medicare Other | Attending: Emergency Medicine | Admitting: Emergency Medicine

## 2017-01-15 ENCOUNTER — Emergency Department (HOSPITAL_COMMUNITY): Payer: Medicare Other

## 2017-01-15 DIAGNOSIS — Z7982 Long term (current) use of aspirin: Secondary | ICD-10-CM | POA: Diagnosis not present

## 2017-01-15 DIAGNOSIS — S42121A Displaced fracture of acromial process, right shoulder, initial encounter for closed fracture: Secondary | ICD-10-CM | POA: Diagnosis not present

## 2017-01-15 DIAGNOSIS — Y939 Activity, unspecified: Secondary | ICD-10-CM | POA: Diagnosis not present

## 2017-01-15 DIAGNOSIS — R51 Headache: Secondary | ICD-10-CM | POA: Insufficient documentation

## 2017-01-15 DIAGNOSIS — E119 Type 2 diabetes mellitus without complications: Secondary | ICD-10-CM | POA: Diagnosis not present

## 2017-01-15 DIAGNOSIS — Y92128 Other place in nursing home as the place of occurrence of the external cause: Secondary | ICD-10-CM | POA: Diagnosis not present

## 2017-01-15 DIAGNOSIS — S42124A Nondisplaced fracture of acromial process, right shoulder, initial encounter for closed fracture: Secondary | ICD-10-CM

## 2017-01-15 DIAGNOSIS — S0083XA Contusion of other part of head, initial encounter: Secondary | ICD-10-CM | POA: Diagnosis not present

## 2017-01-15 DIAGNOSIS — Z87891 Personal history of nicotine dependence: Secondary | ICD-10-CM | POA: Diagnosis not present

## 2017-01-15 DIAGNOSIS — Z7984 Long term (current) use of oral hypoglycemic drugs: Secondary | ICD-10-CM | POA: Insufficient documentation

## 2017-01-15 DIAGNOSIS — S4991XA Unspecified injury of right shoulder and upper arm, initial encounter: Secondary | ICD-10-CM | POA: Diagnosis present

## 2017-01-15 DIAGNOSIS — Z79899 Other long term (current) drug therapy: Secondary | ICD-10-CM | POA: Insufficient documentation

## 2017-01-15 DIAGNOSIS — W1830XA Fall on same level, unspecified, initial encounter: Secondary | ICD-10-CM | POA: Insufficient documentation

## 2017-01-15 DIAGNOSIS — Y999 Unspecified external cause status: Secondary | ICD-10-CM | POA: Diagnosis not present

## 2017-01-15 MED ORDER — TRAMADOL HCL 50 MG PO TABS: 50.0000 mg | ORAL_TABLET | Freq: Four times a day (QID) | ORAL | 0 refills | Status: DC | PRN

## 2017-01-15 MED ORDER — ACETAMINOPHEN 325 MG PO TABS: 650.0000 mg | ORAL_TABLET | Freq: Once | ORAL | Status: DC

## 2017-01-15 NOTE — Discharge Instructions (Signed)
KEEP your shoulder immobilizer in place unless you are breathing. Follow-up in one week with Dr. Fara Chutealdorff.   Return to the emergency department for any pain that is severe or uncontrolled, new numbness or tingling in the hand. Or any other new or worsening symptoms of concern. Apply ice to the affected area several times a day.
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
## 2017-01-15 NOTE — ED Triage Notes (Signed)
Pt fell from a standing position onto back. Pt has pain to right shoulder and limited mobility.

## 2017-12-29 ENCOUNTER — Other Ambulatory Visit: Payer: Self-pay | Admitting: Pharmacist

## 2017-12-29 NOTE — Patient Outreach (Signed)
[facility] Care Management  12/29/2017  Matthew CairoDwight B Calderon Aug 25, 1932 [id number]   Incoming call from Matthew Calderon in response to the [facility] Med[facility] Adherence [facility]. Speak with patient. HIPAA identifiers verified and verbal consent received.  Matthew Calderon reports that he takes his metformin daily as directed. Denies any missed doses or barriers to adherence. Counsel patient on the importance of medication adherence. Matthew Calderon denies any medication questions/concerns at this time.  Will close pharmacy episode.  Duanne MoronElisabeth Seidy Labreck, PharmD, [facility] Su[facility] SpringsBCACP Clinical Pharmacist [facility] Care Management [phone]

## 2018-04-15 ENCOUNTER — Emergency Department (HOSPITAL_COMMUNITY)
Admission: EM | Admit: 2018-04-15 | Discharge: 2018-04-15 | Disposition: A | Discharge status: Home / Self Care | Payer: Medicare Other | Attending: Emergency Medicine | Admitting: Emergency Medicine

## 2018-04-15 ENCOUNTER — Other Ambulatory Visit: Payer: Self-pay

## 2018-04-15 ENCOUNTER — Emergency Department (HOSPITAL_COMMUNITY): Payer: Medicare Other

## 2018-04-15 ENCOUNTER — Encounter (HOSPITAL_COMMUNITY): Payer: Self-pay

## 2018-04-15 DIAGNOSIS — W19XXXA Unspecified fall, initial encounter: Secondary | ICD-10-CM | POA: Diagnosis not present

## 2018-04-15 DIAGNOSIS — E119 Type 2 diabetes mellitus without complications: Secondary | ICD-10-CM | POA: Insufficient documentation

## 2018-04-15 DIAGNOSIS — Y92410 Unspecified street and highway as the place of occurrence of the external cause: Secondary | ICD-10-CM | POA: Diagnosis not present

## 2018-04-15 DIAGNOSIS — Y939 Activity, unspecified: Secondary | ICD-10-CM | POA: Insufficient documentation

## 2018-04-15 DIAGNOSIS — Z23 Encounter for immunization: Secondary | ICD-10-CM | POA: Insufficient documentation

## 2018-04-15 DIAGNOSIS — Y999 Unspecified external cause status: Secondary | ICD-10-CM | POA: Diagnosis not present

## 2018-04-15 DIAGNOSIS — Z87891 Personal history of nicotine dependence: Secondary | ICD-10-CM | POA: Diagnosis not present

## 2018-04-15 DIAGNOSIS — Z7984 Long term (current) use of oral hypoglycemic drugs: Secondary | ICD-10-CM | POA: Diagnosis not present

## 2018-04-15 DIAGNOSIS — Z7982 Long term (current) use of aspirin: Secondary | ICD-10-CM | POA: Insufficient documentation

## 2018-04-15 DIAGNOSIS — S0001XA Abrasion of scalp, initial encounter: Secondary | ICD-10-CM | POA: Insufficient documentation

## 2018-04-15 DIAGNOSIS — Z79899 Other long term (current) drug therapy: Secondary | ICD-10-CM | POA: Insufficient documentation

## 2018-04-15 DIAGNOSIS — S098XXA Other specified injuries of head, initial encounter: Secondary | ICD-10-CM | POA: Diagnosis present

## 2018-04-15 MED ORDER — TETANUS-DIPHTH-ACELL PERTUSSIS 5-2.5-18.5 LF-MCG/0.5 IM SUSP
0.5000 mL | Freq: Once | INTRAMUSCULAR | Status: AC
Start: 2018-04-15 — End: 2018-04-15
  Administered 2018-04-15: 0.5 mL via INTRAMUSCULAR
  Filled 2018-04-15: qty 0.5

## 2018-04-15 NOTE — Discharge Instructions (Signed)
If you develop a severe headache, blurry vision, confusion, vomiting, or any other new/concerning symptoms and return to the ER for evaluation.  Your CT scan showed a possible old stroke, unclear when this occurred.  Follow-up with your family doctor for further workup.

## 2018-04-15 NOTE — ED Notes (Signed)
[facility] called for transport.  

## 2018-04-15 NOTE — ED Notes (Signed)
Pt left without this RN knowing, unable to obtain final recent set of vital signs.

## 2018-04-15 NOTE — ED Provider Notes (Signed)
[facility] EMERGENCY DEPARTMENT Provider Note   CSN: [id number] Arrival date & time: 04/15/18  1734     History   Chief Complaint Chief Complaint  Patient presents with  . Fall    HPI Matthew Calderon is a 84 y.o. male.  HPI  84 year old male presents after a fall and head injury.  The patient was using his rolling walker and took an alternate route to get back to his home when he did not realize that the road started going downhill.  Lost control of the walker and fell.  Hit the back of his head on the concrete.  Did not lose consciousness and denies any headache but has been told he has an abrasion to the back of his head.  A little bit of some stiffness to his left neck and pain with range of motion.  No vomiting.  No blurry vision, dizziness, or weakness/numbness.  No other injuries or pain.  He is not on any blood thinners and denies aspirin use.  He is unsure of his last tetanus immunization.  Past Medical History:  Diagnosis Date  . Anemia    takes iron pill  . Anxiety   . Arthritis   . Depression   . Diabetes mellitus without complication (HCC)   . GERD (gastroesophageal reflux disease)    pepto  . H/O hiatal hernia   . Pneumonia    4 years ago  . Post-nasal drip   . Prostate hyperplasia, benign localized, without urinary obstruction   . Seasonal allergies   . Sleep apnea    no CPAP    Patient Active Problem List   Diagnosis Date Noted  . Degenerative joint disease (DJD) of hip 01/13/2014  . Diabetes (HCC) 01/13/2014    Past Surgical History:  Procedure Laterality Date  . BACK SURGERY  1995   discectomy  . COLONOSCOPY W/ BIOPSIES AND POLYPECTOMY    . EYE SURGERY Bilateral    cataracts  . EYE SURGERY Bilateral    glaucoma shunts  . JOINT REPLACEMENT Left 1992   knee  . JOINT REPLACEMENT Right 1996   knee  . JOINT REPLACEMENT Right 2009   hip  . JOINT REPLACEMENT Left 2005   hip  . KNEE ARTHROSCOPY Left 1981  . KNEE ARTHROSCOPY  WITH PATELLA RECONSTRUCTION Right 2010  . KNEE SURGERY Bilateral 1954   knee surgery and placed in a cast for 6 weeks  . KNEE SURGERY  1961   cartilage removed  . LEG SURGERY Bilateral 1974, 1975   straighten legs  . SHOULDER OPEN ROTATOR CUFF REPAIR Right 2010  . TONSILLECTOMY    . TOTAL HIP ARTHROPLASTY Right 01/13/2014   Procedure: TOTAL HIP ARTHROPLASTY ANTERIOR APPROACH;  Surgeon: Velna Ochs, MD;  Location: [facility] OR;  Service: Orthopedics;  Laterality: Right;        Home Medications    Prior to Admission medications   Medication Sig Start Date End Date Taking? Authorizing Provider  acetaminophen (TYLENOL) 500 MG tablet Take 1,000 mg by mouth 2 (two) times daily.    [provider]  aspirin EC 325 MG EC tablet Take 1 tablet (325 mg total) by mouth 2 (two) times daily after a meal. 01/14/14   Elodia Florence, PA-C  Cholecalciferol 5000 UNITS TABS Take 1 tablet by mouth once a week.    [provider]  dextromethorphan-guaiFENesin (TUSSIN DM) 10-100 MG/5ML liquid Take 10 mLs by mouth every 4 (four) hours as needed for cough.  [provider]  escitalopram (LEXAPRO) 20 MG tablet Take 20 mg by mouth daily.    [provider]  Ferrous Sulfate 143 (45 FE) MG TBCR Take 1 tablet by mouth daily.    [provider]  HYDROcodone-acetaminophen (NORCO/VICODIN) 5-325 MG per tablet Take 1-2 tablets by mouth every 4 (four) hours as needed (breakthrough pain). 01/14/14   Elodia Florence, PA-C  Melatonin 10 MG TBCR Take 1 tablet by mouth at bedtime.    [provider]  metFORMIN (GLUCOPHAGE-XR) 500 MG 24 hr tablet Take 1,500 mg by mouth daily with breakfast.    [provider]  methocarbamol (ROBAXIN) 500 MG tablet Take 1 tablet (500 mg total) by mouth every 6 (six) hours as needed for muscle spasms. 01/14/14   Elodia Florence, PA-C  Multiple Vitamins-Minerals (PRESERVISION AREDS 2) CAPS Take 2 capsules by mouth 2 (two) times daily.    [provider]  nortriptyline (PAMELOR) 25 MG capsule Take 25 mg by mouth at bedtime.    [provider]  pravastatin (PRAVACHOL) 80 MG tablet Take 80 mg by mouth daily.    [provider]  traMADol (ULTRAM) 50 MG tablet Take 1 tablet (50 mg total) by mouth every 6 (six) hours as needed. 01/15/17   Arthor Captain, PA-C  zolpidem (AMBIEN) 10 MG tablet Take 10 mg by mouth at bedtime.    [provider]    Family History History reviewed. No pertinent family history.  Social History Social History   Tobacco Use  . Smoking status: Former Smoker    Packs/day: 1.00    Years: 18.00    Pack years: 18.00    Types: Cigarettes  . Smokeless tobacco: Never Used  . Tobacco comment: stopped 45 years ago  Substance Use Topics  . Alcohol use: Yes    Comment: social  . Drug use: No     Allergies   Penicillins and Sulfa antibiotics   Review of Systems Review of Systems  Eyes: Negative for visual disturbance.  Gastrointestinal: Negative for nausea and vomiting.  Musculoskeletal: Positive for neck pain. Negative for arthralgias and back pain.  Neurological: Negative for dizziness, weakness, numbness and headaches.  All other systems reviewed and are negative.    Physical Exam Updated Vital Signs BP (!) 156/91   Pulse 92   Temp 98.4 F (36.9 C) (Oral) Comment: Simultaneous filing. User may not have seen previous data. Comment (Src): Simultaneous filing. User may not have seen previous data.  Resp 16   Ht 5' (1.524 m)   Wt 72.6 kg   SpO2 99%   BMI 31.25 kg/m   Physical Exam  Constitutional: He is oriented to person, place, and time. He appears well-developed and well-nourished. No distress.  HENT:  Head: Normocephalic. Head is with abrasion.    Right Ear: External ear normal.  Left Ear: External ear normal.  Nose: Nose normal.  Eyes: Pupils are equal, round, and reactive to light. EOM are normal. Right eye exhibits no discharge. Left eye exhibits no  discharge.  Neck: Normal range of motion. Neck supple. Muscular tenderness (mild) present. No spinous process tenderness present.    Cardiovascular: Normal rate, regular rhythm and normal heart sounds.  Pulmonary/Chest: Effort normal and breath sounds normal.  Abdominal: Soft. There is no tenderness.  Musculoskeletal: He exhibits no edema.  Neurological: He is alert and oriented to person, place, and time.  CN 3-12 grossly intact. 5/5 strength in all 4 extremities. Grossly normal sensation. Normal finger to  nose.  Ambulates without assistance or difficulty  Skin: Skin is warm and dry. He is not diaphoretic.  Nursing note and vitals reviewed.    ED Treatments / Results  Labs (all labs ordered are listed, but only abnormal results are displayed) Labs Reviewed - No data to display  EKG None  Radiology Ct Head Wo Contrast  Result Date: 04/15/2018 CLINICAL DATA:  84 year old male status post fall striking head on concrete. Left side neck pain. EXAM: CT HEAD WITHOUT CONTRAST CT CERVICAL SPINE WITHOUT CONTRAST TECHNIQUE: Multidetector CT imaging of the head and cervical spine was performed following the standard protocol without intravenous contrast. Multiplanar CT image reconstructions of the cervical spine were also generated. COMPARISON:  Head CT 01/15/2017. FINDINGS: CT HEAD FINDINGS Brain: Small chronic lacunar infarcts suspected in both thalami and stable. Questionable new hypodensity in the lateral right caudate on series 4, image 18. Elsewhere stable and negative gray-white matter differentiation. Unchanged in a lateral dystrophic calcifications in the left basal ganglia. Stable cerebral volume. No midline shift, ventriculomegaly, mass effect, evidence of mass lesion, intracranial hemorrhage or evidence of cortically based acute infarction. Vascular: Calcified atherosclerosis at the skull base. No suspicious intracranial vascular hyperdensity. Skull: Stable and intact. Sinuses/Orbits:  Visualized paranasal sinuses and mastoids are stable and well pneumatized. Other: No scalp hematoma identified. Stable and negative orbit soft tissues. CT CERVICAL SPINE FINDINGS Alignment: Multilevel degenerative appearing mild anterolisthesis in the cervical spine from C3-C4 through C6-C7. Bilateral posterior element alignment is within normal limits. Cervicothoracic junction alignment is within normal limits. Skull base and vertebrae: Congenital incomplete ossification of the posterior C1 ring. Visualized skull base is intact. No atlanto-occipital dissociation. Osteopenia. No cervical spine fracture identified. Soft tissues and spinal canal: No prevertebral fluid or swelling. No visible canal hematoma. Small hypodense nodule in the left thyroid lobe appears benign. Disc levels: Widespread cervical facet degeneration associated with the multilevel spondylolisthesis. No cervical spinal stenosis suspected. Upper chest: Visible upper thoracic levels appear intact with disc, endplate, and facet degeneration. Negative lung apices and noncontrast visible superior mediastinum. IMPRESSION: 1. No acute traumatic injury identified in the head or cervical spine. 2. Small vessel disease including age indeterminate small lacunar infarct at the lateral right caudate which is new since 2018. No hemorrhage or mass effect. 3. Cervical spine degeneration primarily in the form of mild spondylolisthesis with facet arthropathy. Electronically Signed   By: Odessa Fleming M.D.   On: 04/15/2018 19:15   Ct Cervical Spine Wo Contrast  Result Date: 04/15/2018 CLINICAL DATA:  84 year old male status post fall striking head on concrete. Left side neck pain. EXAM: CT HEAD WITHOUT CONTRAST CT CERVICAL SPINE WITHOUT CONTRAST TECHNIQUE: Multidetector CT imaging of the head and cervical spine was performed following the standard protocol without intravenous contrast. Multiplanar CT image reconstructions of the cervical spine were also generated.  COMPARISON:  Head CT 01/15/2017. FINDINGS: CT HEAD FINDINGS Brain: Small chronic lacunar infarcts suspected in both thalami and stable. Questionable new hypodensity in the lateral right caudate on series 4, image 18. Elsewhere stable and negative gray-white matter differentiation. Unchanged in a lateral dystrophic calcifications in the left basal ganglia. Stable cerebral volume. No midline shift, ventriculomegaly, mass effect, evidence of mass lesion, intracranial hemorrhage or evidence of cortically based acute infarction. Vascular: Calcified atherosclerosis at the skull base. No suspicious intracranial vascular hyperdensity. Skull: Stable and intact. Sinuses/Orbits: Visualized paranasal sinuses and mastoids are stable and well pneumatized. Other: No scalp hematoma identified. Stable and negative orbit soft tissues. CT CERVICAL  SPINE FINDINGS Alignment: Multilevel degenerative appearing mild anterolisthesis in the cervical spine from C3-C4 through C6-C7. Bilateral posterior element alignment is within normal limits. Cervicothoracic junction alignment is within normal limits. Skull base and vertebrae: Congenital incomplete ossification of the posterior C1 ring. Visualized skull base is intact. No atlanto-occipital dissociation. Osteopenia. No cervical spine fracture identified. Soft tissues and spinal canal: No prevertebral fluid or swelling. No visible canal hematoma. Small hypodense nodule in the left thyroid lobe appears benign. Disc levels: Widespread cervical facet degeneration associated with the multilevel spondylolisthesis. No cervical spinal stenosis suspected. Upper chest: Visible upper thoracic levels appear intact with disc, endplate, and facet degeneration. Negative lung apices and noncontrast visible superior mediastinum. IMPRESSION: 1. No acute traumatic injury identified in the head or cervical spine. 2. Small vessel disease including age indeterminate small lacunar infarct at the lateral right  caudate which is new since 2018. No hemorrhage or mass effect. 3. Cervical spine degeneration primarily in the form of mild spondylolisthesis with facet arthropathy. Electronically Signed   By: Odessa Fleming M.D.   On: 04/15/2018 19:15    Procedures Procedures (including critical care time)  Medications Ordered in ED Medications  Tdap (BOOSTRIX) injection 0.5 mL (0.5 mLs Intramuscular Given 04/15/18 1842)     Initial Impression / Assessment and Plan / ED Course  I have reviewed the triage vital signs and the nursing notes.  Pertinent labs & imaging results that were available during my care of the patient were reviewed by me and considered in my medical decision making (see chart for details).     Patient with minor head injury after mechanical fall.  Given his age CT head and C-spine obtained given some lateral neck tenderness.  These are unremarkable as far as an acute traumatic perspective.  There is some small chronic lacunar infarcts, but the patient does not have any acute strokelike symptoms.  Given these are not acute/subacute I do not think emergent MRI is needed and he can follow-up with PCP.  We discussed return precautions.  Tetanus updated.  Final Clinical Impressions(s) / ED Diagnoses   Final diagnoses:  Fall, initial encounter  Abrasion of scalp, initial encounter    ED Discharge Orders    None       Pricilla Loveless, MD 04/15/18 [phone]

## 2018-04-15 NOTE — ED Triage Notes (Signed)
Pt was using rolator walker and fell while going down a hill on a sidewalk. Abrasion to back of head. No LOC. No blood thinners/antiplatelets.  CBG 175

## 2018-06-20 ENCOUNTER — Emergency Department (HOSPITAL_COMMUNITY): Payer: Medicare Other

## 2018-06-20 ENCOUNTER — Encounter (HOSPITAL_COMMUNITY): Payer: Self-pay | Admitting: Oncology

## 2018-06-20 ENCOUNTER — Other Ambulatory Visit: Payer: Self-pay

## 2018-06-20 ENCOUNTER — Inpatient Hospital Stay (HOSPITAL_COMMUNITY)
Admission: EM | Admit: 2018-06-20 | Discharge: 2018-06-26 | DRG: 280 | Disposition: A | Discharge status: Skilled Nursing Facility | Payer: Medicare Other | Attending: Internal Medicine | Admitting: Internal Medicine

## 2018-06-20 DIAGNOSIS — E1110 Type 2 diabetes mellitus with ketoacidosis without coma: Secondary | ICD-10-CM | POA: Diagnosis present

## 2018-06-20 DIAGNOSIS — R739 Hyperglycemia, unspecified: Secondary | ICD-10-CM | POA: Insufficient documentation

## 2018-06-20 DIAGNOSIS — Z882 Allergy status to sulfonamides status: Secondary | ICD-10-CM | POA: Diagnosis not present

## 2018-06-20 DIAGNOSIS — R3911 Hesitancy of micturition: Secondary | ICD-10-CM | POA: Diagnosis not present

## 2018-06-20 DIAGNOSIS — Z96643 Presence of artificial hip joint, bilateral: Secondary | ICD-10-CM | POA: Diagnosis present

## 2018-06-20 DIAGNOSIS — Z794 Long term (current) use of insulin: Secondary | ICD-10-CM

## 2018-06-20 DIAGNOSIS — N179 Acute kidney failure, unspecified: Secondary | ICD-10-CM | POA: Diagnosis present

## 2018-06-20 DIAGNOSIS — N401 Enlarged prostate with lower urinary tract symptoms: Secondary | ICD-10-CM | POA: Diagnosis present

## 2018-06-20 DIAGNOSIS — K219 Gastro-esophageal reflux disease without esophagitis: Secondary | ICD-10-CM | POA: Diagnosis present

## 2018-06-20 DIAGNOSIS — I951 Orthostatic hypotension: Secondary | ICD-10-CM | POA: Diagnosis present

## 2018-06-20 DIAGNOSIS — F329 Major depressive disorder, single episode, unspecified: Secondary | ICD-10-CM | POA: Diagnosis present

## 2018-06-20 DIAGNOSIS — I255 Ischemic cardiomyopathy: Secondary | ICD-10-CM | POA: Diagnosis present

## 2018-06-20 DIAGNOSIS — Z880 Allergy status to penicillin: Secondary | ICD-10-CM | POA: Diagnosis not present

## 2018-06-20 DIAGNOSIS — N400 Enlarged prostate without lower urinary tract symptoms: Secondary | ICD-10-CM | POA: Diagnosis not present

## 2018-06-20 DIAGNOSIS — D72829 Elevated white blood cell count, unspecified: Secondary | ICD-10-CM

## 2018-06-20 DIAGNOSIS — J069 Acute upper respiratory infection, unspecified: Secondary | ICD-10-CM

## 2018-06-20 DIAGNOSIS — Z79899 Other long term (current) drug therapy: Secondary | ICD-10-CM

## 2018-06-20 DIAGNOSIS — I214 Non-ST elevation (NSTEMI) myocardial infarction: Secondary | ICD-10-CM | POA: Diagnosis present

## 2018-06-20 DIAGNOSIS — R55 Syncope and collapse: Secondary | ICD-10-CM | POA: Insufficient documentation

## 2018-06-20 DIAGNOSIS — I2510 Atherosclerotic heart disease of native coronary artery without angina pectoris: Secondary | ICD-10-CM | POA: Diagnosis present

## 2018-06-20 DIAGNOSIS — R351 Nocturia: Secondary | ICD-10-CM

## 2018-06-20 DIAGNOSIS — E876 Hypokalemia: Secondary | ICD-10-CM | POA: Diagnosis present

## 2018-06-20 DIAGNOSIS — F17211 Nicotine dependence, cigarettes, in remission: Secondary | ICD-10-CM

## 2018-06-20 DIAGNOSIS — I5021 Acute systolic (congestive) heart failure: Secondary | ICD-10-CM | POA: Diagnosis present

## 2018-06-20 DIAGNOSIS — E119 Type 2 diabetes mellitus without complications: Secondary | ICD-10-CM

## 2018-06-20 DIAGNOSIS — J209 Acute bronchitis, unspecified: Secondary | ICD-10-CM | POA: Diagnosis present

## 2018-06-20 DIAGNOSIS — K0889 Other specified disorders of teeth and supporting structures: Secondary | ICD-10-CM

## 2018-06-20 DIAGNOSIS — J309 Allergic rhinitis, unspecified: Secondary | ICD-10-CM

## 2018-06-20 DIAGNOSIS — M1990 Unspecified osteoarthritis, unspecified site: Secondary | ICD-10-CM

## 2018-06-20 DIAGNOSIS — E1165 Type 2 diabetes mellitus with hyperglycemia: Secondary | ICD-10-CM | POA: Diagnosis not present

## 2018-06-20 DIAGNOSIS — G4700 Insomnia, unspecified: Secondary | ICD-10-CM | POA: Diagnosis present

## 2018-06-20 DIAGNOSIS — G4733 Obstructive sleep apnea (adult) (pediatric): Secondary | ICD-10-CM | POA: Diagnosis present

## 2018-06-20 DIAGNOSIS — D649 Anemia, unspecified: Secondary | ICD-10-CM

## 2018-06-20 DIAGNOSIS — Z7984 Long term (current) use of oral hypoglycemic drugs: Secondary | ICD-10-CM

## 2018-06-20 LAB — COMPREHENSIVE METABOLIC PANEL
ALT: 17 U/L (ref 0–44)
AST: 33 U/L (ref 15–41)
Albumin: 3.7 g/dL (ref 3.5–5.0)
Alkaline Phosphatase: 66 U/L (ref 38–126)
Anion gap: 15 (ref 5–15)
BILIRUBIN TOTAL: 0.6 mg/dL (ref 0.3–1.2)
BUN: 22 mg/dL (ref 8–23)
CO2: 20 mmol/L — ABNORMAL LOW (ref 22–32)
CREATININE: 1.26 mg/dL — AB (ref 0.61–1.24)
Calcium: 8.5 mg/dL — ABNORMAL LOW (ref 8.9–10.3)
Chloride: 100 mmol/L (ref 98–111)
GFR, EST NON AFRICAN AMERICAN: 52 mL/min — AB (ref >60)
Glucose, Bld: 462 mg/dL — ABNORMAL HIGH (ref 70–99)
Potassium: 3.5 mmol/L (ref 3.5–5.1)
Sodium: 135 mmol/L (ref 135–145)
TOTAL PROTEIN: 6.8 g/dL (ref 6.5–8.1)

## 2018-06-20 LAB — URINALYSIS, ROUTINE W REFLEX MICROSCOPIC
Bacteria, UA: NONE SEEN
Bilirubin Urine: NEGATIVE
Glucose, UA: >=500 mg/dL — AB
KETONES UR: 20 mg/dL — AB
Leukocytes, UA: NEGATIVE
Nitrite: NEGATIVE
Protein, ur: 30 mg/dL — AB
Specific Gravity, Urine: 1.026 (ref 1.005–1.030)
pH: 5 (ref 5.0–8.0)

## 2018-06-20 LAB — I-STAT TROPONIN, ED
Troponin i, poc: 1.08 ng/mL (ref 0.00–0.08)
Troponin i, poc: 1.1 ng/mL (ref 0.00–0.08)
Troponin i, poc: 2.64 ng/mL (ref 0.00–0.08)

## 2018-06-20 LAB — CBC WITH DIFFERENTIAL/PLATELET
Abs Immature Granulocytes: 0.06 10*3/uL (ref 0.00–0.07)
Basophils Absolute: 0 10*3/uL (ref 0.0–0.1)
Basophils Relative: 0 %
Eosinophils Absolute: 0 10*3/uL (ref 0.0–0.5)
Eosinophils Relative: 0 %
HCT: 37 % — ABNORMAL LOW (ref 39.0–52.0)
Hemoglobin: 12 g/dL — ABNORMAL LOW (ref 13.0–17.0)
Immature Granulocytes: 1 %
Lymphocytes Relative: 5 %
Lymphs Abs: 0.6 10*3/uL — ABNORMAL LOW (ref 0.7–4.0)
MCH: 26.7 pg (ref 26.0–34.0)
MCHC: 32.4 g/dL (ref 30.0–36.0)
MCV: 82.2 fL (ref 80.0–100.0)
MONOS PCT: 12 %
Monocytes Absolute: 1.4 10*3/uL — ABNORMAL HIGH (ref 0.1–1.0)
Neutro Abs: 10 10*3/uL — ABNORMAL HIGH (ref 1.7–7.7)
Neutrophils Relative %: 82 %
PLATELETS: 171 10*3/uL (ref 150–400)
RBC: 4.5 MIL/uL (ref 4.22–5.81)
RDW: 17.2 % — ABNORMAL HIGH (ref 11.5–15.5)
WBC: 12.1 10*3/uL — AB (ref 4.0–10.5)
nRBC: 0 % (ref 0.0–0.2)

## 2018-06-20 LAB — GLUCOSE, CAPILLARY
GLUCOSE-CAPILLARY: 254 mg/dL — AB (ref 70–99)
Glucose-Capillary: 172 mg/dL — ABNORMAL HIGH (ref 70–99)

## 2018-06-20 LAB — I-STAT CG4 LACTIC ACID, ED
Lactic Acid, Venous: 2.98 mmol/L (ref 0.5–1.9)
Lactic Acid, Venous: 1.45 mmol/L (ref 0.5–1.9)

## 2018-06-20 LAB — HEPARIN LEVEL (UNFRACTIONATED): Heparin Unfractionated: 0.38 IU/mL (ref 0.30–0.70)

## 2018-06-20 LAB — TROPONIN I
Troponin I: 2.72 ng/mL (ref <0.03)
Troponin I: 2.76 ng/mL (ref <0.03)

## 2018-06-20 LAB — CBG MONITORING, ED
Glucose-Capillary: 421 mg/dL — ABNORMAL HIGH (ref 70–99)
Glucose-Capillary: 187 mg/dL — ABNORMAL HIGH (ref 70–99)

## 2018-06-20 LAB — HEMOGLOBIN A1C
Hgb A1c MFr Bld: 7.6 % — ABNORMAL HIGH (ref 4.8–5.6)
Mean Plasma Glucose: 171.42 mg/dL

## 2018-06-20 LAB — INFLUENZA PANEL BY PCR (TYPE A & B)
Influenza A By PCR: NEGATIVE
Influenza B By PCR: NEGATIVE

## 2018-06-20 MED ORDER — METFORMIN HCL ER 750 MG PO TB24
1500.0000 mg | ORAL_TABLET | Freq: Every day | ORAL | Status: DC
  Administered 2018-06-21: 1500 mg via ORAL
  Filled 2018-06-20: qty 2

## 2018-06-20 MED ORDER — INSULIN ASPART 100 UNIT/ML ~~LOC~~ SOLN
0.0000 [IU] | Freq: Three times a day (TID) | SUBCUTANEOUS | Status: DC
  Administered 2018-06-21: 8 [IU] via SUBCUTANEOUS
  Administered 2018-06-21 (×2): 3 [IU] via SUBCUTANEOUS
  Administered 2018-06-22 (×2): 11 [IU] via SUBCUTANEOUS
  Administered 2018-06-22 – 2018-06-23 (×2): 5 [IU] via SUBCUTANEOUS
  Administered 2018-06-23: 15 [IU] via SUBCUTANEOUS
  Administered 2018-06-23 – 2018-06-24 (×2): 11 [IU] via SUBCUTANEOUS
  Administered 2018-06-24: 15 [IU] via SUBCUTANEOUS
  Administered 2018-06-25: 3 [IU] via SUBCUTANEOUS
  Administered 2018-06-25: 5 [IU] via SUBCUTANEOUS
  Administered 2018-06-25: 3 [IU] via SUBCUTANEOUS
  Administered 2018-06-26: 5 [IU] via SUBCUTANEOUS

## 2018-06-20 MED ORDER — SODIUM CHLORIDE 0.9 % IV SOLN
INTRAVENOUS | Status: AC
  Administered 2018-06-20: 17:00:00 via INTRAVENOUS

## 2018-06-20 MED ORDER — TRAZODONE HCL 50 MG PO TABS
50.0000 mg | ORAL_TABLET | Freq: Every day | ORAL | Status: DC
  Administered 2018-06-20 – 2018-06-24 (×5): 50 mg via ORAL
  Filled 2018-06-20 (×6): qty 1

## 2018-06-20 MED ORDER — IOPAMIDOL (ISOVUE-370) INJECTION 76%
INTRAVENOUS | Status: AC
  Administered 2018-06-20: 60 mL
  Filled 2018-06-20: qty 100

## 2018-06-20 MED ORDER — OXYCODONE HCL 5 MG PO TABS: 5.0000 mg | ORAL_TABLET | ORAL | Status: DC | PRN

## 2018-06-20 MED ORDER — VANCOMYCIN HCL IN DEXTROSE 750-5 MG/150ML-% IV SOLN: 750.0000 mg | INTRAVENOUS | Status: DC

## 2018-06-20 MED ORDER — TIMOLOL MALEATE 0.5 % OP SOLN
1.0000 [drp] | Freq: Two times a day (BID) | OPHTHALMIC | Status: DC
  Administered 2018-06-20 – 2018-06-26 (×12): 1 [drp] via OPHTHALMIC
  Filled 2018-06-20: qty 5

## 2018-06-20 MED ORDER — HEPARIN (PORCINE) 25000 UT/250ML-% IV SOLN
1050.0000 [IU]/h | INTRAVENOUS | Status: DC
  Administered 2018-06-20: 800 [IU]/h via INTRAVENOUS
  Administered 2018-06-21: 900 [IU]/h via INTRAVENOUS
  Administered 2018-06-22 – 2018-06-24 (×3): 1050 [IU]/h via INTRAVENOUS
  Filled 2018-06-20 (×5): qty 250

## 2018-06-20 MED ORDER — VANCOMYCIN HCL 10 G IV SOLR
1500.0000 mg | Freq: Once | INTRAVENOUS | Status: DC
  Filled 2018-06-20: qty 1500

## 2018-06-20 MED ORDER — SODIUM CHLORIDE 0.9 % IV BOLUS
1000.0000 mL | Freq: Once | INTRAVENOUS | Status: AC
  Administered 2018-06-20: 1000 mL via INTRAVENOUS

## 2018-06-20 MED ORDER — ONDANSETRON HCL 4 MG PO TABS
4.0000 mg | ORAL_TABLET | Freq: Four times a day (QID) | ORAL | Status: DC | PRN
  Administered 2018-06-20: 4 mg via ORAL
  Filled 2018-06-20: qty 1

## 2018-06-20 MED ORDER — SODIUM CHLORIDE 0.9% FLUSH
3.0000 mL | Freq: Two times a day (BID) | INTRAVENOUS | Status: DC
  Administered 2018-06-20 – 2018-06-25 (×11): 3 mL via INTRAVENOUS

## 2018-06-20 MED ORDER — SODIUM CHLORIDE 0.9 % IV SOLN: 2.0000 g | INTRAVENOUS | Status: DC

## 2018-06-20 MED ORDER — FERROUS SULFATE 325 (65 FE) MG PO TABS
325.0000 mg | ORAL_TABLET | Freq: Every day | ORAL | Status: DC
  Administered 2018-06-21 – 2018-06-26 (×6): 325 mg via ORAL
  Filled 2018-06-20 (×6): qty 1

## 2018-06-20 MED ORDER — INSULIN ASPART 100 UNIT/ML ~~LOC~~ SOLN
0.0000 [IU] | Freq: Every day | SUBCUTANEOUS | Status: DC
  Administered 2018-06-20 – 2018-06-21 (×2): 2 [IU] via SUBCUTANEOUS
  Administered 2018-06-22: 5 [IU] via SUBCUTANEOUS
  Administered 2018-06-23: 3 [IU] via SUBCUTANEOUS
  Administered 2018-06-24 – 2018-06-25 (×2): 2 [IU] via SUBCUTANEOUS

## 2018-06-20 MED ORDER — ALFUZOSIN HCL ER 10 MG PO TB24
10.0000 mg | ORAL_TABLET | Freq: Every day | ORAL | Status: DC
  Administered 2018-06-21 – 2018-06-26 (×6): 10 mg via ORAL
  Filled 2018-06-20 (×6): qty 1

## 2018-06-20 MED ORDER — BENZONATATE 100 MG PO CAPS
200.0000 mg | ORAL_CAPSULE | Freq: Two times a day (BID) | ORAL | Status: DC | PRN
  Administered 2018-06-21 – 2018-06-24 (×7): 200 mg via ORAL
  Filled 2018-06-20 (×7): qty 2

## 2018-06-20 MED ORDER — ACETAMINOPHEN 500 MG PO TABS
1000.0000 mg | ORAL_TABLET | Freq: Two times a day (BID) | ORAL | Status: DC
  Administered 2018-06-20 – 2018-06-21 (×2): 1000 mg via ORAL
  Filled 2018-06-20 (×2): qty 2

## 2018-06-20 MED ORDER — MIRABEGRON ER 50 MG PO TB24
50.0000 mg | ORAL_TABLET | Freq: Every day | ORAL | Status: DC
  Administered 2018-06-20 – 2018-06-26 (×7): 50 mg via ORAL
  Filled 2018-06-20 (×7): qty 1

## 2018-06-20 MED ORDER — BRIMONIDINE TARTRATE 0.2 % OP SOLN
1.0000 [drp] | Freq: Two times a day (BID) | OPHTHALMIC | Status: DC
  Administered 2018-06-20 – 2018-06-26 (×12): 1 [drp] via OPHTHALMIC
  Filled 2018-06-20: qty 5

## 2018-06-20 MED ORDER — ONDANSETRON HCL 4 MG/2ML IJ SOLN: 4.0000 mg | Freq: Four times a day (QID) | INTRAMUSCULAR | Status: DC | PRN

## 2018-06-20 MED ORDER — HYDROCOD POLST-CPM POLST ER 10-8 MG/5ML PO SUER
5.0000 mL | Freq: Two times a day (BID) | ORAL | Status: DC | PRN
  Administered 2018-06-20 – 2018-06-24 (×4): 5 mL via ORAL
  Filled 2018-06-20 (×4): qty 5

## 2018-06-20 MED ORDER — VANCOMYCIN HCL 10 G IV SOLR
1500.0000 mg | Freq: Once | INTRAVENOUS | Status: AC
  Administered 2018-06-20: 1500 mg via INTRAVENOUS
  Filled 2018-06-20: qty 1500

## 2018-06-20 MED ORDER — PRESERVISION AREDS 2 PO CAPS: 2.0000 | ORAL_CAPSULE | Freq: Two times a day (BID) | ORAL | Status: DC

## 2018-06-20 MED ORDER — MELATONIN 3 MG PO TABS
3.0000 | ORAL_TABLET | Freq: Every day | ORAL | Status: DC
  Administered 2018-06-20 – 2018-06-25 (×6): 9 mg via ORAL
  Filled 2018-06-20 (×6): qty 3

## 2018-06-20 MED ORDER — HEPARIN BOLUS VIA INFUSION
4000.0000 [IU] | Freq: Once | INTRAVENOUS | Status: AC
  Administered 2018-06-20: 4000 [IU] via INTRAVENOUS
  Filled 2018-06-20: qty 4000

## 2018-06-20 MED ORDER — ASPIRIN 81 MG PO CHEW
324.0000 mg | CHEWABLE_TABLET | Freq: Once | ORAL | Status: AC
  Administered 2018-06-20: 324 mg via ORAL
  Filled 2018-06-20: qty 4

## 2018-06-20 MED ORDER — SODIUM CHLORIDE 0.9 % IV SOLN
2.0000 g | Freq: Once | INTRAVENOUS | Status: AC
  Administered 2018-06-20: 2 g via INTRAVENOUS
  Filled 2018-06-20: qty 2

## 2018-06-20 NOTE — Progress Notes (Signed)
Pharmacy Antibiotic Note  Matthew Calderon is a 84 y.o. [facility] after syncopal episode, fall, and suspected pneumonia.  Pharmacy has been consulted for Vancomycin and Cefepime dosing. Patient has been sick for 4 days outpatient with respiratory symptoms  PCN allergy noted - confirmed with patient no swelling, childhood allergy. Will give cephalosporin and monitor.   SCr up at 1.26 (baseline 0.7-0.8). Afebrile. Mild leukocytosis. LA trending down. CXR and CTA negative. Blood culture pending.   Plan: Cefepime 2g IV x1, then 2g IV every 24 hours Vancomycin 1500mg  IV x1, then 750mg  IV every 36 hours. Goal AUC 400-550. Expected AUC: 468 SCr used: 1.26 Monitor SCr - if worsens may need to revert to vancomycin pulse dosing  Height: 5' (152.4 cm) Weight: 165 lb (74.8 kg) IBW/kg (Calculated) : 50  Temp (24hrs), Avg:98.8 F (37.1 C), Min:98.8 F (37.1 C), Max:98.8 F (37.1 C)  Recent Labs  Lab 06/20/18 0600 06/20/18 0626 06/20/18 0927  WBC 12.1*  --   --   CREATININE 1.26*  --   --   LATICACIDVEN  --  2.98* 1.45    Estimated Creatinine Clearance: 37.6 mL/min (A) (by C-G formula based on SCr of 1.26 mg/dL (H)).    Allergies  Allergen Reactions  . Penicillins Hives    DID THE REACTION INVOLVE: Swelling of the face/tongue/throat, SOB, or low BP? No Sudden or severe rash/hives, skin peeling, or the inside of the mouth or nose? No Did it require medical treatment? No When did it last happen?childhood allergy If all above answers are "NO", may proceed with cephalosporin use.   . Sulfa Antibiotics Hives    Antimicrobials this admission: Vancomycin 12/28 >> Cefepime 12/28 >>  Dose adjustments this admission:   Microbiology results: 12/28 BCx:    Thank you for allowing pharmacy to be a part of this patient's care.  Link SnufferJessica Neeti Knudtson, PharmD, BCPS, BCCCP Clinical Pharmacist Please [facility] to [facility]  numbers 06/20/2018 10:01 AM

## 2018-06-20 NOTE — ED Notes (Signed)
Attempted report x1. 

## 2018-06-20 NOTE — ED Provider Notes (Signed)
[facility] EMERGENCY DEPARTMENT Provider Note   CSN: [id number] Arrival date & time: 06/20/18  0516     History   Chief Complaint Chief Complaint  Patient presents with  . Loss of Consciousness    HPI Matthew Calderon is a 84 y.o. male with a history of diabetes mellitus, BPH, anemia, GERD, and OSA who presents to the emergency department by EMS from [facility] independent living after a syncopal episode.  EMS reports they were initially called out for a fall.  EMS reports the patient was found unclothed in the floor by the toilet on arrival.   The patient reports he had a positive syncopal episode.  He states that he passed out after a coughing episode.  When asked if the patient hit his head he states "no, but I think I hit my heart. I was down on all fours and running my hand along the back of the commode. I think I fell into the shower and hit my chest."   He denies headache, visual changes, neck pain, numbness, weakness, chest pain, lower extremity edema, nausea, vomiting, diarrhea, otalgia, sore throat, rash.  States he has a history of an enlarged prostate, but denies dysuria, hematuria, or urinary frequency or hesitancy.  He reports he has been feeling generally unwell for the last 4 days with fatigue, cough, and congestion. He spent all of Christmas day in bed.  He was given 1 g of Tylenol in route by EMS after having a temperature of 99.8 on his home thermometer.   He reports that his wife is currently being treated for pneumonia.  Spoke with the patient's daughter, Matthew Calderon, who reports the patient also had a fall 2 days ago while at her home in [place] for the holiday.  She reports he seemed "off" for the last few days.   The history is provided by the patient. No language interpreter was used.    Past Medical History:  Diagnosis Date  . Anemia    takes iron pill  . Anxiety   . Arthritis   . Depression   . Diabetes mellitus without complication  (HCC)   . GERD (gastroesophageal reflux disease)    pepto  . H/O hiatal hernia   . Pneumonia    4 years ago  . Post-nasal drip   . Prostate hyperplasia, benign localized, without urinary obstruction   . Seasonal allergies   . Sleep apnea    no CPAP    Patient Active Problem List   Diagnosis Date Noted  . NSTEMI (non-ST elevated myocardial infarction) (HCC) 06/20/2018  . Degenerative joint disease (DJD) of hip 01/13/2014  . Diabetes (HCC) 01/13/2014    Past Surgical History:  Procedure Laterality Date  . BACK SURGERY  1995   discectomy  . COLONOSCOPY W/ BIOPSIES AND POLYPECTOMY    . EYE SURGERY Bilateral    cataracts  . EYE SURGERY Bilateral    glaucoma shunts  . JOINT REPLACEMENT Left 1992   knee  . JOINT REPLACEMENT Right 1996   knee  . JOINT REPLACEMENT Right 2009   hip  . JOINT REPLACEMENT Left 2005   hip  . KNEE ARTHROSCOPY Left 1981  . KNEE ARTHROSCOPY WITH PATELLA RECONSTRUCTION Right 2010  . KNEE SURGERY Bilateral 1954   knee surgery and placed in a cast for 6 weeks  . KNEE SURGERY  1961   cartilage removed  . LEG SURGERY Bilateral 1974, 1975   straighten legs  . SHOULDER OPEN ROTATOR CUFF  REPAIR Right 2010  . TONSILLECTOMY    . TOTAL HIP ARTHROPLASTY Right 01/13/2014   Procedure: TOTAL HIP ARTHROPLASTY ANTERIOR APPROACH;  Surgeon: Velna Ochs, MD;  Location: [facility] OR;  Service: Orthopedics;  Laterality: Right;        Home Medications    Prior to Admission medications   Medication Sig Start Date End Date Taking? Authorizing Provider  acetaminophen (TYLENOL) 500 MG tablet Take 1,000 mg by mouth 2 (two) times daily.   Yes [provider]  brimonidine (ALPHAGAN) 0.2 % ophthalmic solution Place 1 drop into both eyes 2 (two) times daily. 09/22/17  Yes [provider]  Cholecalciferol 25 MCG (1000 UT) tablet Take 2,000 Units by mouth every morning.    Yes [provider]  dextromethorphan-guaiFENesin (TUSSIN DM) 10-100 MG/5ML  liquid Take 10 mLs by mouth every 4 (four) hours as needed for cough.    Yes [provider]  Ferrous Sulfate 143 (45 FE) MG TBCR Take 1 tablet by mouth daily.   Yes [provider]  Melatonin 10 MG TBCR Take 1 tablet by mouth at bedtime.   Yes [provider]  metFORMIN (GLUCOPHAGE-XR) 500 MG 24 hr tablet Take 1,500 mg by mouth daily with breakfast.   Yes [provider]  Multiple Vitamins-Minerals (PRESERVISION AREDS 2) CAPS Take 2 capsules by mouth 2 (two) times daily.   Yes [provider]  MYRBETRIQ 50 MG TB24 tablet Take 50 mg by mouth daily. 03/23/18  Yes [provider]  nystatin (MYCOSTATIN/NYSTOP) powder Apply 1 Bottle topically 2 (two) times daily.   Yes [provider]  tamsulosin (FLOMAX) 0.4 MG CAPS capsule Take 0.4 mg by mouth daily. 03/19/18  Yes [provider]  timolol (TIMOPTIC) 0.5 % ophthalmic solution Place 1 drop into both eyes 2 (two) times daily. 09/22/17  Yes [provider]  traZODone (DESYREL) 50 MG tablet Take 50 mg by mouth at bedtime. 03/19/18  Yes [provider]  methocarbamol (ROBAXIN) 500 MG tablet Take 1 tablet (500 mg total) by mouth every 6 (six) hours as needed for muscle spasms. Patient not taking: Reported on 06/20/2018 01/14/14   Elodia Florence, PA-C  traMADol (ULTRAM) 50 MG tablet Take 1 tablet (50 mg total) by mouth every 6 (six) hours as needed. Patient not taking: Reported on 06/20/2018 01/15/17   Arthor Captain, PA-C    Family History No family history on file.  Social History Social History   Tobacco Use  . Smoking status: Former Smoker    Packs/day: 1.00    Years: 18.00    Pack years: 18.00    Types: Cigarettes  . Smokeless tobacco: Never Used  . Tobacco comment: stopped 45 years ago  Substance Use Topics  . Alcohol use: Yes    Comment: social  . Drug use: No     Allergies   Penicillins and Sulfa antibiotics   Review of Systems Review of Systems    Constitutional: Positive for fatigue. Negative for appetite change and fever.       Malaise  Respiratory: Positive for cough. Negative for shortness of breath.   Cardiovascular: Negative for chest pain.  Gastrointestinal: Negative for abdominal pain, diarrhea, nausea and vomiting.  Genitourinary: Negative for dysuria, frequency, genital sores and penile pain.  Musculoskeletal: Negative for back pain.  Skin: Negative for rash.  Allergic/Immunologic: Negative for immunocompromised state.  Neurological: Positive for syncope. Negative for headaches.  Psychiatric/Behavioral: Negative for confusion.   Physical Exam Updated Vital Signs BP 124/78  Pulse (!) 103   Temp 99 F (37.2 C) (Rectal)   Resp 13   Ht 5' (1.524 m)   Wt 74.8 kg   SpO2 97%   BMI 32.22 kg/m   Physical Exam Vitals signs and nursing note reviewed.  Constitutional:      Appearance: He is well-developed.     Comments: Minimally diaphoretic  HENT:     Head: Normocephalic.  Eyes:     Extraocular Movements: Extraocular movements intact.     Conjunctiva/sclera: Conjunctivae normal.     Pupils: Pupils are equal, round, and reactive to light.  Neck:     Musculoskeletal: Neck supple.  Cardiovascular:     Rate and Rhythm: Regular rhythm. Tachycardia present.     Heart sounds: No murmur. No friction rub. No gallop.   Pulmonary:     Effort: Pulmonary effort is normal. No respiratory distress.     Breath sounds: No wheezing, rhonchi or rales.  Chest:     Chest wall: No tenderness.  Abdominal:     General: There is no distension.     Palpations: Abdomen is soft. There is no mass.     Tenderness: There is no abdominal tenderness. There is no guarding or rebound.     Hernia: No hernia is present.  Musculoskeletal:        General: No tenderness.     Right lower leg: No edema.     Left lower leg: No edema.     Comments: No midline tenderness to the cervical, thoracic, or lumbar spinous processes or bilateral  paraspinal muscles.  Full active and passive range of motion of the neck.  Skin:    General: Skin is warm and dry.     Capillary Refill: Capillary refill takes less than 2 seconds.  Neurological:     Mental Status: He is alert.     Comments: Alert and oriented x4.  Moves all 4 extremities.  GCS 15.  Finger-to-nose is intact bilaterally.  Sensation is intact and equal throughout.  Good strength against resistance of the bilateral upper and lower extremities.  Gait is deferred at this time.  Cranial nerves II through XII are grossly intact.  Psychiatric:        Behavior: Behavior normal.      ED Treatments / Results  Labs (all labs ordered are listed, but only abnormal results are displayed) Labs Reviewed  CBC WITH DIFFERENTIAL/PLATELET - Abnormal; Notable for the following components:      Result Value   WBC 12.1 (*)    Hemoglobin 12.0 (*)    HCT 37.0 (*)    RDW 17.2 (*)    Neutro Abs 10.0 (*)    Lymphs Abs 0.6 (*)    Monocytes Absolute 1.4 (*)    All other components within normal limits  COMPREHENSIVE METABOLIC PANEL - Abnormal; Notable for the following components:   CO2 20 (*)    Glucose, Bld 462 (*)    Creatinine, Ser 1.26 (*)    Calcium 8.5 (*)    GFR calc non Af Amer 52 (*)    All other components within normal limits  URINALYSIS, ROUTINE W REFLEX MICROSCOPIC - Abnormal; Notable for the following components:   Glucose, UA >=500 (*)    Hgb urine dipstick MODERATE (*)    Ketones, ur 20 (*)    Protein, ur 30 (*)    All other components within normal limits  CBG MONITORING, ED - Abnormal; Notable for the following components:  Glucose-Capillary 421 (*)    All other components within normal limits  I-STAT CG4 LACTIC ACID, ED - Abnormal; Notable for the following components:   Lactic Acid, Venous 2.98 (*)    All other components within normal limits  I-STAT TROPONIN, ED - Abnormal; Notable for the following components:   Troponin i, poc 1.08 (*)    All other  components within normal limits  I-STAT TROPONIN, ED - Abnormal; Notable for the following components:   Troponin i, poc 1.10 (*)    All other components within normal limits  I-STAT TROPONIN, ED - Abnormal; Notable for the following components:   Troponin i, poc 2.64 (*)    All other components within normal limits  CBG MONITORING, ED - Abnormal; Notable for the following components:   Glucose-Capillary 187 (*)    All other components within normal limits  CULTURE, BLOOD (ROUTINE X 2)  CULTURE, BLOOD (ROUTINE X 2)  INFLUENZA PANEL BY PCR (TYPE A & B)  HEPARIN LEVEL (UNFRACTIONATED)  TROPONIN I  TROPONIN I  TROPONIN I  I-STAT CG4 LACTIC ACID, ED    EKG EKG Interpretation  Date/Time:  Saturday June 20 2018 05:25:37 EST Ventricular Rate:  110 PR Interval:    QRS Duration: 91 QT Interval:  326 QTC Calculation: 441 R Axis:   -32 Text Interpretation:  Sinus tachycardia Atrial premature complexes Probable left atrial enlargement Left axis deviation Abnormal R-wave progression, late transition Minimal ST depression, inferior leads similar to July 2015 Confirmed by Pricilla Loveless [phone]) on 06/20/2018 6:37:05 AM   Radiology Dg Chest 2 View  Result Date: 06/20/2018 CLINICAL DATA:  Dyspnea. EXAM: CHEST - 2 VIEW COMPARISON:  10/17/2015 FINDINGS: Low volume chest with accentuates heart size. Heart size is normal on the lateral view. Elevation of the right diaphragm that is chronic in from eventration based on the lateral view. There is no edema, consolidation, effusion, or pneumothorax. Rounded density overlapping the midthoracic spine is stable from 2017 and attributed to lateral osteophyte IMPRESSION: Low volume chest without acute finding. Electronically Signed   By: Marnee Spring M.D.   On: 06/20/2018 07:08   Ct Head Wo Contrast  Result Date: 06/20/2018 CLINICAL DATA:  Patient status post syncopal episode. EXAM: CT HEAD WITHOUT CONTRAST CT CERVICAL SPINE WITHOUT CONTRAST  TECHNIQUE: Multidetector CT imaging of the head and cervical spine was performed following the standard protocol without intravenous contrast. Multiplanar CT image reconstructions of the cervical spine were also generated. COMPARISON:  Brain and cervical spine CT 04/15/2018 FINDINGS: CT HEAD FINDINGS Brain: Unchanged bilateral chronic thalamic lacunar infarcts. Unchanged right basal ganglia lacunar infarcts. Unchanged left basal ganglia calcifications. No evidence for acute cortically based infarct, intracranial hemorrhage, mass lesion or mass-effect. Vascular: Unremarkable Skull: Intact. Sinuses/Orbits: Mucosal thickening involving the right maxillary sinus, ethmoid air cells. Mastoid air cells are unremarkable. Orbits are unremarkable. Other: None. CT CERVICAL SPINE FINDINGS Alignment: Multilevel mild anterolisthesis of the cervical spine C3-4 through C6-7, stable from prior and likely secondary to degenerative changes at the facet joints. Skull base and vertebrae: No acute fracture. No primary bone lesion or focal pathologic process. Incomplete C1 ring posteriorly. Soft tissues and spinal canal: No prevertebral fluid or swelling. No visible canal hematoma. Disc levels: Multilevel facet degenerative changes. No acute fracture. Upper chest: Unremarkable. Other: Similar-appearing 1.6 cm low-attenuation nodule left thyroid lobe. IMPRESSION: No acute intracranial process. Small chronic bilateral thalamic lacunar infarcts. No acute cervical spine fracture. Electronically Signed   By: Francis Gaines.D.  On: 06/20/2018 08:27   Ct Angio Chest Pe W And/or Wo Contrast  Result Date: 06/20/2018 CLINICAL DATA:  84 year old with a syncopal episode causing fall. Shortness of breath. EXAM: CT ANGIOGRAPHY CHEST WITH CONTRAST TECHNIQUE: Multidetector CT imaging of the chest was performed using the standard protocol during bolus administration of intravenous contrast. Multiplanar CT image reconstructions and MIPs were obtained  to evaluate the vascular anatomy. CONTRAST:  60mL ISOVUE-370 IOPAMIDOL (ISOVUE-370) INJECTION 76% COMPARISON:  Chest radiograph 06/20/2018 and abdominal CT 09/07/2012 FINDINGS: Cardiovascular: Satisfactory opacification of the pulmonary arteries to the segmental level. No evidence of pulmonary embolism. Normal heart size. No pericardial effusion. Few atherosclerotic calcifications in the thoracic aorta without aortic enlargement. There are coronary artery calcifications, particularly in the LAD. Mediastinum/Nodes: Small hiatal hernia and similar to 2014. No significant lymph node enlargement in the mediastinum or hila. No axillary lymph node enlargement. Lungs/Pleura: Trachea and mainstem bronchi are patent. No pleural effusions. Poorly defined nodule along the posterior right lower lobe on sequence 6, image 74. This measures up to 7 mm based on the reformatted images. There was a similar nodular density in this area in 2014. This is most compatible with a benign finding. Scattered areas of atelectasis or air trapping particularly in the right lower lobe. No significant lung consolidation or airspace disease. Upper Abdomen: Again noted is a 1.8 cm low attenuating structure in the left hepatic lobe and not significantly changed since 2014. Possible right kidney stone which is incompletely evaluated. Fatty infiltration in the pancreas. 9 mm density at the base of the gallbladder suggestive for a gallstone. No gallbladder distension. Soft tissue nodule medial to the spleen is most compatible with a splenule and stable. No acute findings in the upper abdomen. Musculoskeletal: Multilevel degenerative changes in thoracic spine. No acute abnormality. Review of the MIP images confirms the above findings. IMPRESSION: 1. Negative for a pulmonary embolism. 2. No acute chest abnormality. 3. Patchy densities in the lungs are compatible with atelectasis and mild air trapping. No significant lung consolidation or airspace disease.  4. Stable incidental findings in the chest and abdomen as described. 5. Cholelithiasis. 6. Aortic Atherosclerosis (ICD10-I70.0). Coronary artery calcifications. 7. Possible right kidney stone. Electronically Signed   By: Richarda Overlie M.D.   On: 06/20/2018 08:34   Ct Cervical Spine Wo Contrast  Result Date: 06/20/2018 CLINICAL DATA:  Patient status post syncopal episode. EXAM: CT HEAD WITHOUT CONTRAST CT CERVICAL SPINE WITHOUT CONTRAST TECHNIQUE: Multidetector CT imaging of the head and cervical spine was performed following the standard protocol without intravenous contrast. Multiplanar CT image reconstructions of the cervical spine were also generated. COMPARISON:  Brain and cervical spine CT 04/15/2018 FINDINGS: CT HEAD FINDINGS Brain: Unchanged bilateral chronic thalamic lacunar infarcts. Unchanged right basal ganglia lacunar infarcts. Unchanged left basal ganglia calcifications. No evidence for acute cortically based infarct, intracranial hemorrhage, mass lesion or mass-effect. Vascular: Unremarkable Skull: Intact. Sinuses/Orbits: Mucosal thickening involving the right maxillary sinus, ethmoid air cells. Mastoid air cells are unremarkable. Orbits are unremarkable. Other: None. CT CERVICAL SPINE FINDINGS Alignment: Multilevel mild anterolisthesis of the cervical spine C3-4 through C6-7, stable from prior and likely secondary to degenerative changes at the facet joints. Skull base and vertebrae: No acute fracture. No primary bone lesion or focal pathologic process. Incomplete C1 ring posteriorly. Soft tissues and spinal canal: No prevertebral fluid or swelling. No visible canal hematoma. Disc levels: Multilevel facet degenerative changes. No acute fracture. Upper chest: Unremarkable. Other: Similar-appearing 1.6 cm low-attenuation nodule left thyroid lobe. IMPRESSION:  No acute intracranial process. Small chronic bilateral thalamic lacunar infarcts. No acute cervical spine fracture. Electronically Signed   By:  Annia Beltrew  Davis M.D.   On: 06/20/2018 08:27   Ct Renal Stone Study  Result Date: 06/20/2018 CLINICAL DATA:  84 year old with hematuria, unknown cause. EXAM: CT ABDOMEN AND PELVIS WITHOUT CONTRAST TECHNIQUE: Multidetector CT imaging of the abdomen and pelvis was performed following the standard protocol without IV contrast. COMPARISON:  CT abdomen 09/07/2012.  Chest CT 06/20/2018 FINDINGS: Lower chest: Stable 7 mm nodular density in the right lower lobe on sequence 4, image 14 and no significant change since 2014. Few patchy densities in the right lower lobe are most compatible with atelectasis. No pleural effusions. Hepatobiliary: Chronic 1.6 cm low-density structure in left hepatic lobe probably represents a cyst and no significant change since 2014. Small stone in the gallbladder measuring roughly 8 mm. No evidence for gallbladder dilatation or inflammation. No biliary dilatation. Pancreas: Fatty infiltration of the pancreas without inflammation or duct dilatation. Spleen: Normal in size without focal abnormality. Adrenals/Urinary Tract: Normal adrenal glands. Limited evaluation for nonobstructive kidney stones due to the recent contrast administration from the chest CTA. There is contrast in the renal collecting systems and urinary bladder. Urinary bladder is not distended but there are multiple small foci of contrast invaginating into the bladder wall and suggestive for bladder trabeculation. Proximal and distal right ureter is opacified with contrast. The mid right ureter does not have contrast. Contrast in the left ureter without suspicious lesion. Negative for hydronephrosis or ureter dilatation. No suspicious renal lesions on this examination. Stomach/Bowel: Small hiatal hernia. Extensive diverticulosis in the sigmoid colon and distal descending colon without inflammatory changes. There appears to be in normal small appendix. No evidence for bowel dilatation or obstruction. Stomach is unremarkable.  Vascular/Lymphatic: Atherosclerotic calcifications in the abdominal aorta without aneurysm. No lymph node enlargement in the abdomen or pelvis. Reproductive: Limited evaluation of the prostate due to streak artifact from the bilateral hip replacements. Other: Negative for free air. No free fluid. Small umbilical hernia containing fat. Musculoskeletal: Bilateral hip replacements are located. Extensive facet arthropathy in the lower lumbar spine. Multiple disc levels with vacuum disc phenomenon. IMPRESSION: 1. Limited evaluation for kidney stones due to the recent chest CTA and contrast administration. Recent chest CTA suggested there may be a right renal calculus but this could not be confirmed on this examination. Negative for hydronephrosis. No suspicious findings in the urinary tract. 2. Evidence for bladder trabeculation. Limited evaluation of the prostate. 3. Multilevel degenerative disease in the lumbar spine. 4. Cholelithiasis.  No biliary dilatation. Electronically Signed   By: Richarda OverlieAdam  Henn M.D.   On: 06/20/2018 11:47    Procedures .Critical Care Performed by: Barkley BoardsMcDonald, Greggory Safranek A, PA-C Authorized by: Barkley BoardsMcDonald, Kewan Mcnease A, PA-C   Critical care provider statement:    Critical care time (minutes):  55   Critical care time was exclusive of:  Separately billable procedures and treating other patients and teaching time   Critical care was necessary to treat or prevent imminent or life-threatening deterioration of the following conditions:  Cardiac failure   Critical care was time spent personally by me on the following activities:  Blood draw for specimens, ordering and performing treatments and interventions, ordering and review of laboratory studies, ordering and review of radiographic studies, pulse oximetry, re-evaluation of patient's condition, discussions with consultants, review of old charts, examination of patient, evaluation of patient's response to treatment and obtaining history from patient or  surrogate   (  including critical care time)  Medications Ordered in ED Medications  heparin ADULT infusion 100 units/mL (25000 units/218mL sodium chloride 0.45%) (800 Units/hr Intravenous New Bag/Given 06/20/18 1400)  acetaminophen (TYLENOL) tablet 1,000 mg (has no administration in time range)  traZODone (DESYREL) tablet 50 mg (has no administration in time range)  metFORMIN (GLUCOPHAGE-XR) 24 hr tablet 1,500 mg (has no administration in time range)  mirabegron ER (MYRBETRIQ) tablet 50 mg (has no administration in time range)  Ferrous Sulfate TBCR 1 tablet (has no administration in time range)  Melatonin TBCR 1 tablet (has no administration in time range)  PRESERVISION AREDS 2 CAPS 2 capsule (has no administration in time range)  brimonidine (ALPHAGAN) 0.2 % ophthalmic solution 1 drop (has no administration in time range)  timolol (TIMOPTIC) 0.5 % ophthalmic solution 1 drop (has no administration in time range)  sodium chloride flush (NS) 0.9 % injection 3 mL (3 mLs Intravenous Given 06/20/18 1535)  0.9 %  sodium chloride infusion (has no administration in time range)  oxyCODONE (Oxy IR/ROXICODONE) immediate release tablet 5 mg (has no administration in time range)  ondansetron (ZOFRAN) tablet 4 mg (has no administration in time range)    Or  ondansetron (ZOFRAN) injection 4 mg (has no administration in time range)  chlorpheniramine-HYDROcodone (TUSSIONEX) 10-8 MG/5ML suspension 5 mL (has no administration in time range)  alfuzosin (UROXATRAL) 24 hr tablet 10 mg (has no administration in time range)  sodium chloride 0.9 % bolus 1,000 mL (0 mLs Intravenous Stopped 06/20/18 0930)  iopamidol (ISOVUE-370) 76 % injection (60 mLs  Contrast Given 06/20/18 0745)  vancomycin (VANCOCIN) 1,500 mg in sodium chloride 0.9 % 500 mL IVPB (0 mg Intravenous Stopped 06/20/18 1205)  ceFEPIme (MAXIPIME) 2 g in sodium chloride 0.9 % 100 mL IVPB (0 g Intravenous Stopped 06/20/18 1206)  aspirin chewable tablet  324 mg (324 mg Oral Given 06/20/18 1247)  heparin bolus via infusion 4,000 Units (4,000 Units Intravenous Bolus from Bag 06/20/18 1400)     Initial Impression / Assessment and Plan / ED Course  I have reviewed the triage vital signs and the nursing notes.  Pertinent labs & imaging results that were available during my care of the patient were reviewed by me and considered in my medical decision making (see chart for details).     84 year old male with a history of diabetes mellitus, BPH, anemia, GERD, and OSA who presented by EMS from independent living after a syncopal episode which caused him to fall.  He thinks he hit his chest on the shower?  States he had a coughing episode before that episode.   He is afebrile.  He has mild tachycardia.  He is normotensive and not hypoxic. No focal neurologic deficits.  Abdomen is unremarkable.  Lungs are clear to auscultation.  The patient was discussed and independently evaluated by Dr. Gwenlyn Fudge, attending physician, and later by Dr. Jacqulyn Bath at shift change.  Initial lactate was 2.98.  Improving to 1.45 after fluids.  Given recent infectious symptoms, initially suspected the syncopal episode was secondary to sepsis.  He had a mild leukocytosis of 12 with a left shift and was started empirically on cefepime and vancomycin for presumed sepsis of unknown etiology.  However, chest x-ray was found to be unremarkable.  Is also noted to be hyperglycemic at 462 with a mild AKI and Cr at 1.26.  Bicarb and anion gap were normal.   Regarding the patient's fall, head CT and cervical spine were unremarkable.  He had no other  focal tenderness or obvious ecchymosis on exam.  Regarding his syncopal episode, EKG with minimal ST segment depression, similar to EKG in July 2015.  Troponin 1.08.  He has no cardiac history and has never been established with cardiology.  Initially suspected PE, but PE study was negative for pulmonary embolism, but demonstrated some patchy  densities in the lungs concerning for atelectasis and mild air trapping.  There was also concern for a possible right kidney stone.  UA with hemoglobinuria, which appeared acute.  In the setting of possible infectious symptoms, CT stone study was ordered to assess for obstructive uropathy.  Stone study was negative.  Influenza panel was negative.  The patient remained afebrile and mildly tachycardic.  He continued to deny chest pain.    Consulted cardiology after second troponin was found to be stable at 1.1.  Received a call back after the patient's 6-hour troponin to be 2.64.  Heparin was initiated the patient was given 324 mg of ASA.  Repeat EKG was unchanged.  He continued to deny chest pain. He was seen by Dr. Algie CofferKadakia, cardiology, who recommended an echocardiogram, initiating medical treatment, and stabilizing his blood sugar.  Consulted the cardiology team and spoke with Dr. Criselda PeachesMullen who will accept the patient for admission.  Also called the patient's daughter Matthew RoundsSally and provided her with an update on her father's care. The patient appears reasonably stabilized for admission considering the current resources, flow, and capabilities available in the ED at this time, and I doubt any other [facility] requiring further screening and/or treatment in the ED prior to admission.     Final Clinical Impressions(s) / ED Diagnoses   Final diagnoses:  Hyperglycemia  NSTEMI (non-ST elevated myocardial infarction) [facility](HCC)  Syncope, cardiogenic    ED Discharge Orders    None       Barkley BoardsMcDonald, Truxton Stupka A, PA-C 06/20/18 1614    Pricilla LovelessGoldston, Scott, MD 06/21/18 1404

## 2018-06-20 NOTE — ED Triage Notes (Addendum)
[facility]  Originally called out for a fall.  Pt reports syncopal event prior to fall.  Pt has had cough/upper respiratory issues since Christmas.  Per EMS pt's wife is currently being tx for PNA.  Pt states he was coughing prior to syncopal event. Pt A&O x 4.  Pt given 1g tylenol en route for fever of 99.8 on pt's home thermometer.

## 2018-06-20 NOTE — H&P (Signed)
History and Physical    Matthew CairoDwight B Farace JYN:[id number] DOB: 05-Jun-1933 DOA: 06/20/2018  PCP: Merri BrunettePharr, Walter, MD  Patient coming from: Home  Chief Complaint: Passing out  HPI: Matthew Calderon is a 84 y.o. male with medical history significant of OSA not on therapy, allergic rhinitis, BPH, GERD, DM2, Depression, Arthritis, anemia who presents after passing out.  He reports that starting last week, while visiting family, he was not feeling his normal self.  He notes that he was more sleepy than usual and was cold a lot of the time.  He returned home and developed a cough which is productive.  He has also had rhinorrhea.  On the morning of admission, he was confused and he found himself in his bathroom crawling on the floor looking for the commode.  He thinks after that time he passed out.  He was able to call EMS and came to the hospital.  He denies chest pain, SOB, dysuria.  He has chronic BPH and what sounds like very severe LUTS.  He notes frequent nocturia, difficulty starting his stream and dribbling.  He is reported as being on tamsulosin, but he is not sure, and he has a sulfa allergy as well.    ED Course: He has had an extensive work up in the ED.  Initial reseults showed a glucose of 462, Cr of 1.26 (normal baseline), CO2 of 20, AG of 15, WBC of 12.1, TnI of 1.08 --> 1.10 --> 2.65.  Lactate of 2.98 --> 1.45. Flu negative.  UA with > 500 glucose, moderate hemoglobin and 20 ketones.  BC X 2 were sent. CTA of the chest was negative for PE or acute chest abnormality, no pneumonia and a possible right kidney stone.  Kidney stone CT did not show a kidney stone (he did have moderate Hgb on UA with few red cells).   Cardiology was consulted for rising Troponin.   Review of Systems: As per HPI otherwise 10 point review of systems negative.   Past Medical History:  Diagnosis Date  . Anemia    takes iron pill  . Anxiety   . Arthritis   . Depression   . Diabetes mellitus without complication (HCC)     . GERD (gastroesophageal reflux disease)    pepto  . H/O hiatal hernia   . Pneumonia    4 years ago  . Post-nasal drip   . Prostate hyperplasia, benign localized, without urinary obstruction   . Seasonal allergies   . Sleep apnea    no CPAP    Past Surgical History:  Procedure Laterality Date  . BACK SURGERY  1995   discectomy  . COLONOSCOPY W/ BIOPSIES AND POLYPECTOMY    . EYE SURGERY Bilateral    cataracts  . EYE SURGERY Bilateral    glaucoma shunts  . JOINT REPLACEMENT Left 1992   knee  . JOINT REPLACEMENT Right 1996   knee  . JOINT REPLACEMENT Right 2009   hip  . JOINT REPLACEMENT Left 2005   hip  . KNEE ARTHROSCOPY Left 1981  . KNEE ARTHROSCOPY WITH PATELLA RECONSTRUCTION Right 2010  . KNEE SURGERY Bilateral 1954   knee surgery and placed in a cast for 6 weeks  . KNEE SURGERY  1961   cartilage removed  . LEG SURGERY Bilateral 1974, 1975   straighten legs  . SHOULDER OPEN ROTATOR CUFF REPAIR Right 2010  . TONSILLECTOMY    . TOTAL HIP ARTHROPLASTY Right 01/13/2014   Procedure: TOTAL HIP ARTHROPLASTY ANTERIOR  APPROACH;  Surgeon: Velna OchsPeter G Dalldorf, MD;  [facility];  Service: Orthopedics;  Laterality: Right;   Reviewed with patient.   reports that he has quit smoking. His smoking use included cigarettes. He has a 18.00 pack-year smoking history. He has never used smokeless tobacco. He reports current alcohol use. He reports that he does not use drugs.  Allergies  Allergen Reactions  . Penicillins Hives    DID THE REACTION INVOLVE: Swelling of the face/tongue/throat, SOB, or low BP? No Sudden or severe rash/hives, skin peeling, or the inside of the mouth or nose? No Did it require medical treatment? No When did it last happen?childhood allergy If all above answers are "NO", may proceed with cephalosporin use.   . Sulfa Antibiotics Hives   Reviewed with patient.  Family History  Problem Relation Age of Onset  . Arthritis Mother   . Arthritis Father      Prior to Admission medications   Medication Sig Start Date End Date Taking? Authorizing Provider  acetaminophen (TYLENOL) 500 MG tablet Take 1,000 mg by mouth 2 (two) times daily.   Yes [provider]  brimonidine (ALPHAGAN) 0.2 % ophthalmic solution Place 1 drop into both eyes 2 (two) times daily. 09/22/17  Yes [provider]  Cholecalciferol 25 MCG (1000 UT) tablet Take 2,000 Units by mouth every morning.    Yes [provider]  dextromethorphan-guaiFENesin (TUSSIN DM) 10-100 MG/5ML liquid Take 10 mLs by mouth every 4 (four) hours as needed for cough.    Yes [provider]  Ferrous Sulfate 143 (45 FE) MG TBCR Take 1 tablet by mouth daily.   Yes [provider]  Melatonin 10 MG TBCR Take 1 tablet by mouth at bedtime.   Yes [provider]  metFORMIN (GLUCOPHAGE-XR) 500 MG 24 hr tablet Take 1,500 mg by mouth daily with breakfast.   Yes [provider]  Multiple Vitamins-Minerals (PRESERVISION AREDS 2) CAPS Take 2 capsules by mouth 2 (two) times daily.   Yes [provider]  MYRBETRIQ 50 MG TB24 tablet Take 50 mg by mouth daily. 03/23/18  Yes [provider]  nystatin (MYCOSTATIN/NYSTOP) powder Apply 1 Bottle topically 2 (two) times daily.   Yes [provider]  tamsulosin (FLOMAX) 0.4 MG CAPS capsule Take 0.4 mg by mouth daily. 03/19/18  Yes [provider]  timolol (TIMOPTIC) 0.5 % ophthalmic solution Place 1 drop into both eyes 2 (two) times daily. 09/22/17  Yes [provider]  traZODone (DESYREL) 50 MG tablet Take 50 mg by mouth at bedtime. 03/19/18  Yes [provider]  methocarbamol (ROBAXIN) 500 MG tablet Take 1 tablet (500 mg total) by mouth every 6 (six) hours as needed for muscle spasms. Patient not taking: Reported on 06/20/2018 01/14/14   Elodia FlorenceNida, Andrew, PA-C  traMADol (ULTRAM) 50 MG tablet Take 1 tablet (50 mg total) by mouth every 6 (six) hours as needed. Patient not  taking: Reported on 06/20/2018 01/15/17   Arthor CaptainHarris, Abigail, PA-C    Physical Exam: Constitutional: Elderly man, lying in bed, cough noted Vitals:   06/20/18 1600 06/20/18 1615 06/20/18 1630 06/20/18 1645  BP: 122/80 121/80 132/83 120/82  Pulse: (!) 110 (!) 103 (!) 106 (!) 105  Resp: 12 (!) 26  14  Temp:      TempSrc:      SpO2: 100% 95% 99% 100%  Weight:      Height:       Eyes: He had bilateral injection of conjunctivae, no icterus  ENMT: Mucous membranes are moist. Poor dentition Neck: normal, supple Respiratory: Surprisingly clear to auscultation with some mild crackles at bases Cardiovascular: Regular, mildly tachycardic, no murmurs Abdomen: +BS, NT, ND Musculoskeletal: no clubbing / cyanosis. Normal muscle tone.  Skin: no rashes, lesions, ulcers on exposed skin, multiple chronic skin changes on arms.  Neurologic: Grossly intact, moving all extremities well, no sensory deficits Psychiatric: Normal judgment and insight. Alert and oriented x 3. Normal mood.    Labs on Admission: I have personally reviewed following labs and imaging studies  CBC: Recent Labs  Lab 06/20/18 0600  WBC 12.1*  NEUTROABS 10.0*  HGB 12.0*  HCT 37.0*  MCV 82.2  PLT 171   Basic Metabolic Panel: Recent Labs  Lab 06/20/18 0600  NA 135  K 3.5  CL 100  CO2 20*  GLUCOSE 462*  BUN 22  CREATININE 1.26*  CALCIUM 8.5*   GFR: Estimated Creatinine Clearance: 37.6 mL/min (A) (by C-G formula based on SCr of 1.26 mg/dL (H)). Liver Function Tests: Recent Labs  Lab 06/20/18 0600  AST 33  ALT 17  ALKPHOS 66  BILITOT 0.6  PROT 6.8  ALBUMIN 3.7   No results for input(s): LIPASE, AMYLASE in the last 168 hours. No results for input(s): AMMONIA in the last 168 hours. Coagulation Profile: No results for input(s): INR, PROTIME in the last 168 hours. Cardiac Enzymes: Recent Labs  Lab 06/20/18 1531  TROPONINI 2.72*   BNP (last 3 results) No results for input(s): PROBNP in the last 8760  hours. HbA1C: No results for input(s): HGBA1C in the last 72 hours. CBG: Recent Labs  Lab 06/20/18 0605 06/20/18 1530  GLUCAP 421* 187*   Lipid Profile: No results for input(s): CHOL, HDL, LDLCALC, TRIG, CHOLHDL, LDLDIRECT in the last 72 hours. Thyroid Function Tests: No results for input(s): TSH, T4TOTAL, FREET4, T3FREE, THYROIDAB in the last 72 hours. Anemia Panel: No results for input(s): VITAMINB12, FOLATE, FERRITIN, TIBC, IRON, RETICCTPCT in the last 72 hours. Urine analysis:    Component Value Date/Time   COLORURINE YELLOW 06/20/2018 0724   APPEARANCEUR CLEAR 06/20/2018 0724   LABSPEC 1.026 06/20/2018 0724   PHURINE 5.0 06/20/2018 0724   GLUCOSEU >=500 (A) 06/20/2018 0724   HGBUR MODERATE (A) 06/20/2018 0724   BILIRUBINUR NEGATIVE 06/20/2018 0724   KETONESUR 20 (A) 06/20/2018 0724   PROTEINUR 30 (A) 06/20/2018 0724   UROBILINOGEN 0.2 01/16/2014 1252   NITRITE NEGATIVE 06/20/2018 0724   LEUKOCYTESUR NEGATIVE 06/20/2018 0724    Radiological Exams on Admission: Dg Chest 2 View  Result Date: 06/20/2018 CLINICAL DATA:  Dyspnea. EXAM: CHEST - 2 VIEW COMPARISON:  10/17/2015 FINDINGS: Low volume chest with accentuates heart size. Heart size is normal on the lateral view. Elevation of the right diaphragm that is chronic in from eventration based on the lateral view. There is no edema, consolidation, effusion, or pneumothorax. Rounded density overlapping the midthoracic spine is stable from 2017 and attributed to lateral osteophyte IMPRESSION: Low volume chest without acute finding. Electronically Signed   By: Marnee Spring M.D.   On: 06/20/2018 07:08   Ct Head Wo Contrast  Result Date: 06/20/2018 CLINICAL DATA:  Patient status post syncopal episode. EXAM: CT HEAD WITHOUT CONTRAST CT CERVICAL SPINE WITHOUT CONTRAST TECHNIQUE: Multidetector CT imaging of the head and cervical spine was performed following the standard protocol without intravenous contrast. Multiplanar CT image  reconstructions of the cervical spine were also generated. COMPARISON:  Brain and cervical spine CT 04/15/2018 FINDINGS: CT HEAD FINDINGS Brain: Unchanged  bilateral chronic thalamic lacunar infarcts. Unchanged right basal ganglia lacunar infarcts. Unchanged left basal ganglia calcifications. No evidence for acute cortically based infarct, intracranial hemorrhage, mass lesion or mass-effect. Vascular: Unremarkable Skull: Intact. Sinuses/Orbits: Mucosal thickening involving the right maxillary sinus, ethmoid air cells. Mastoid air cells are unremarkable. Orbits are unremarkable. Other: None. CT CERVICAL SPINE FINDINGS Alignment: Multilevel mild anterolisthesis of the cervical spine C3-4 through C6-7, stable from prior and likely secondary to degenerative changes at the facet joints. Skull base and vertebrae: No acute fracture. No primary bone lesion or focal pathologic process. Incomplete C1 ring posteriorly. Soft tissues and spinal canal: No prevertebral fluid or swelling. No visible canal hematoma. Disc levels: Multilevel facet degenerative changes. No acute fracture. Upper chest: Unremarkable. Other: Similar-appearing 1.6 cm low-attenuation nodule left thyroid lobe. IMPRESSION: No acute intracranial process. Small chronic bilateral thalamic lacunar infarcts. No acute cervical spine fracture. Electronically Signed   By: Annia Belt M.D.   On: 06/20/2018 08:27   Ct Angio Chest Pe W And/or Wo Contrast  Result Date: 06/20/2018 CLINICAL DATA:  84 year old with a syncopal episode causing fall. Shortness of breath. EXAM: CT ANGIOGRAPHY CHEST WITH CONTRAST TECHNIQUE: Multidetector CT imaging of the chest was performed using the standard protocol during bolus administration of intravenous contrast. Multiplanar CT image reconstructions and MIPs were obtained to evaluate the vascular anatomy. CONTRAST:  60mL ISOVUE-370 IOPAMIDOL (ISOVUE-370) INJECTION 76% COMPARISON:  Chest radiograph 06/20/2018 and abdominal CT  09/07/2012 FINDINGS: Cardiovascular: Satisfactory opacification of the pulmonary arteries to the segmental level. No evidence of pulmonary embolism. Normal heart size. No pericardial effusion. Few atherosclerotic calcifications in the thoracic aorta without aortic enlargement. There are coronary artery calcifications, particularly in the LAD. Mediastinum/Nodes: Small hiatal hernia and similar to 2014. No significant lymph node enlargement in the mediastinum or hila. No axillary lymph node enlargement. Lungs/Pleura: Trachea and mainstem bronchi are patent. No pleural effusions. Poorly defined nodule along the posterior right lower lobe on sequence 6, image 74. This measures up to 7 mm based on the reformatted images. There was a similar nodular density in this area in 2014. This is most compatible with a benign finding. Scattered areas of atelectasis or air trapping particularly in the right lower lobe. No significant lung consolidation or airspace disease. Upper Abdomen: Again noted is a 1.8 cm low attenuating structure in the left hepatic lobe and not significantly changed since 2014. Possible right kidney stone which is incompletely evaluated. Fatty infiltration in the pancreas. 9 mm density at the base of the gallbladder suggestive for a gallstone. No gallbladder distension. Soft tissue nodule medial to the spleen is most compatible with a splenule and stable. No acute findings in the upper abdomen. Musculoskeletal: Multilevel degenerative changes in thoracic spine. No acute abnormality. Review of the MIP images confirms the above findings. IMPRESSION: 1. Negative for a pulmonary embolism. 2. No acute chest abnormality. 3. Patchy densities in the lungs are compatible with atelectasis and mild air trapping. No significant lung consolidation or airspace disease. 4. Stable incidental findings in the chest and abdomen as described. 5. Cholelithiasis. 6. Aortic Atherosclerosis (ICD10-I70.0). Coronary artery  calcifications. 7. Possible right kidney stone. Electronically Signed   By: Richarda Overlie M.D.   On: 06/20/2018 08:34   Ct Cervical Spine Wo Contrast  Result Date: 06/20/2018 CLINICAL DATA:  Patient status post syncopal episode. EXAM: CT HEAD WITHOUT CONTRAST CT CERVICAL SPINE WITHOUT CONTRAST TECHNIQUE: Multidetector CT imaging of the head and cervical spine was performed following the standard protocol without intravenous  contrast. Multiplanar CT image reconstructions of the cervical spine were also generated. COMPARISON:  Brain and cervical spine CT 04/15/2018 FINDINGS: CT HEAD FINDINGS Brain: Unchanged bilateral chronic thalamic lacunar infarcts. Unchanged right basal ganglia lacunar infarcts. Unchanged left basal ganglia calcifications. No evidence for acute cortically based infarct, intracranial hemorrhage, mass lesion or mass-effect. Vascular: Unremarkable Skull: Intact. Sinuses/Orbits: Mucosal thickening involving the right maxillary sinus, ethmoid air cells. Mastoid air cells are unremarkable. Orbits are unremarkable. Other: None. CT CERVICAL SPINE FINDINGS Alignment: Multilevel mild anterolisthesis of the cervical spine C3-4 through C6-7, stable from prior and likely secondary to degenerative changes at the facet joints. Skull base and vertebrae: No acute fracture. No primary bone lesion or focal pathologic process. Incomplete C1 ring posteriorly. Soft tissues and spinal canal: No prevertebral fluid or swelling. No visible canal hematoma. Disc levels: Multilevel facet degenerative changes. No acute fracture. Upper chest: Unremarkable. Other: Similar-appearing 1.6 cm low-attenuation nodule left thyroid lobe. IMPRESSION: No acute intracranial process. Small chronic bilateral thalamic lacunar infarcts. No acute cervical spine fracture. Electronically Signed   By: Annia Belt M.D.   On: 06/20/2018 08:27   Ct Renal Stone Study  Result Date: 06/20/2018 CLINICAL DATA:  84 year old with hematuria, unknown  cause. EXAM: CT ABDOMEN AND PELVIS WITHOUT CONTRAST TECHNIQUE: Multidetector CT imaging of the abdomen and pelvis was performed following the standard protocol without IV contrast. COMPARISON:  CT abdomen 09/07/2012.  Chest CT 06/20/2018 FINDINGS: Lower chest: Stable 7 mm nodular density in the right lower lobe on sequence 4, image 14 and no significant change since 2014. Few patchy densities in the right lower lobe are most compatible with atelectasis. No pleural effusions. Hepatobiliary: Chronic 1.6 cm low-density structure in left hepatic lobe probably represents a cyst and no significant change since 2014. Small stone in the gallbladder measuring roughly 8 mm. No evidence for gallbladder dilatation or inflammation. No biliary dilatation. Pancreas: Fatty infiltration of the pancreas without inflammation or duct dilatation. Spleen: Normal in size without focal abnormality. Adrenals/Urinary Tract: Normal adrenal glands. Limited evaluation for nonobstructive kidney stones due to the recent contrast administration from the chest CTA. There is contrast in the renal collecting systems and urinary bladder. Urinary bladder is not distended but there are multiple small foci of contrast invaginating into the bladder wall and suggestive for bladder trabeculation. Proximal and distal right ureter is opacified with contrast. The mid right ureter does not have contrast. Contrast in the left ureter without suspicious lesion. Negative for hydronephrosis or ureter dilatation. No suspicious renal lesions on this examination. Stomach/Bowel: Small hiatal hernia. Extensive diverticulosis in the sigmoid colon and distal descending colon without inflammatory changes. There appears to be in normal small appendix. No evidence for bowel dilatation or obstruction. Stomach is unremarkable. Vascular/Lymphatic: Atherosclerotic calcifications in the abdominal aorta without aneurysm. No lymph node enlargement in the abdomen or pelvis.  Reproductive: Limited evaluation of the prostate due to streak artifact from the bilateral hip replacements. Other: Negative for free air. No free fluid. Small umbilical hernia containing fat. Musculoskeletal: Bilateral hip replacements are located. Extensive facet arthropathy in the lower lumbar spine. Multiple disc levels with vacuum disc phenomenon. IMPRESSION: 1. Limited evaluation for kidney stones due to the recent chest CTA and contrast administration. Recent chest CTA suggested there may be a right renal calculus but this could not be confirmed on this examination. Negative for hydronephrosis. No suspicious findings in the urinary tract. 2. Evidence for bladder trabeculation. Limited evaluation of the prostate. 3. Multilevel degenerative disease in the  lumbar spine. 4. Cholelithiasis.  No biliary dilatation. Electronically Signed   By: Richarda Overlie M.D.   On: 06/20/2018 11:47    EKG: Independently reviewed. SR, TWI and flattening throughout  Assessment/Plan  NSTEMI (non-ST elevated myocardial infarction), syncope - Cardiology consulted - Telemetry - Trend troponin - AM EKG - TTE - Heparin GGT for 24 - 48 hours  Cough, URI symptoms - Unclear cause, likely viral URI given CXR and CT scan of chest did not show pneumonia - Cough suppression with tussionex - Monitor for fever  AKI - Slow hydration with IVF of NS at 75cc/hr for 10 hours, reassess and give another fluid challenge - Trend Cr  Diabetes with hyperglycemia - SSI, hold metformin given acute illness - Start long acting insulin based on insulin requirements - Check A1C  Leukocytosis - Likely related to acute issues above, trend CBC while on heparin - Monitor vitals, if fever would perform sputum and blood cultures and start Abx  BPH - Symptoms very uncontrolled, I have changed him to alfulosin given his sulfa allergy and to see if this helps his symptoms better - Continue myrbetriq   Insomnia - Continue home trazodone  dosing   DVT prophylaxis: Heparin ggt  Code Status: Full  Disposition Plan: Admit for trending troponin  Consults called: Cardiology, Algie Coffer  Admission status: Cardiology telemetry, inpatient    Debe Coder MD [facility] Pager [phone]  If 7PM-7AM, please contact night-coverage [email or web address] Password [facility]  06/20/2018, 5:48 PM

## 2018-06-20 NOTE — ED Notes (Signed)
Assisted pt to [facility].

## 2018-06-20 NOTE — Progress Notes (Signed)
ANTICOAGULATION CONSULT NOTE - Initial Consult  Pharmacy Consult for Heparin Indication: chest pain/ACS  Allergies  Allergen Reactions  . Penicillins Hives    DID THE REACTION INVOLVE: Swelling of the face/tongue/throat, SOB, or low BP? No Sudden or severe rash/hives, skin peeling, or the inside of the mouth or nose? No Did it require medical treatment? No When did it last happen?childhood allergy If all above answers are "NO", may proceed with cephalosporin use.   . Sulfa Antibiotics Hives    Patient Measurements: Height: 5' (152.4 cm) Weight: 165 lb (74.8 kg) IBW/kg (Calculated) : 50 Heparin Dosing Weight: 66.2 kg  Vital Signs: Temp: 98.8 F (37.1 C) (12/28 0527) Temp Source: Oral (12/28 0527) BP: 123/81 (12/28 1215) Pulse Rate: 100 (12/28 1215)  Labs: Recent Labs    06/20/18 0600  HGB 12.0*  HCT 37.0*  PLT 171  CREATININE 1.26*    Estimated Creatinine Clearance: 37.6 mL/min (A) (by C-G formula based on SCr of 1.26 mg/dL (H)).   Medical History: Past Medical History:  Diagnosis Date  . Anemia    takes iron pill  . Anxiety   . Arthritis   . Depression   . Diabetes mellitus without complication (HCC)   . GERD (gastroesophageal reflux disease)    pepto  . H/O hiatal hernia   . Pneumonia    4 years ago  . Post-nasal drip   . Prostate hyperplasia, benign localized, without urinary obstruction   . Seasonal allergies   . Sleep apnea    no CPAP    Assessment: 84 year old male known to pharmacy for antibiotic dosing now to start IV Heparin for ACS/chest pain. Pharmacy consulted to dose.   Troponin is elevated to 2.64 this afternoon.  Hgb 12, Platelets are stable at 171 from results on prior admissions.  No bleeding reported.  Patient was not on anticoagulation prior to admission.   Goal of Therapy:  Heparin level 0.3-0.7 units/ml Monitor platelets by anticoagulation protocol: Yes   Plan:  Heparin bolus 4000 units x1 Then initiate Heparin IV  at rate of 800 units/hr. Heparin level in 8 hours. Monitor daily Heparin level, CBC, and signs/symptoms of bleeding  Link SnufferJessica Annali Lybrand, PharmD, BCPS, BCCCP Clinical Pharmacist Please refer to [facility] for [facility] numbers 06/20/2018,12:38 PM

## 2018-06-20 NOTE — Progress Notes (Signed)
ANTICOAGULATION CONSULT NOTE   Pharmacy Consult for Heparin Indication: chest pain/ACS  Allergies  Allergen Reactions  . Penicillins Hives    DID THE REACTION INVOLVE: Swelling of the face/tongue/throat, SOB, or low BP? No Sudden or severe rash/hives, skin peeling, or the inside of the mouth or nose? No Did it require medical treatment? No When did it last happen?childhood allergy If all above answers are "NO", may proceed with cephalosporin use.   . Sulfa Antibiotics Hives    Patient Measurements: Height: 5' (152.4 cm) Weight: 164 lb 1.6 oz (74.4 kg) IBW/kg (Calculated) : 50 Heparin Dosing Weight: 66.2 kg  Vital Signs: Temp: 98.7 F (37.1 C) (12/28 1853) Temp Source: Oral (12/28 1853) BP: 118/79 (12/28 2004) Pulse Rate: 100 (12/28 2004)  Labs: Recent Labs    06/20/18 0600 06/20/18 1531 06/20/18 2035  HGB 12.0*  --   --   HCT 37.0*  --   --   PLT 171  --   --   HEPARINUNFRC  --   --  0.38  CREATININE 1.26*  --   --   TROPONINI  --  2.72* 2.76*    Estimated Creatinine Clearance: 37.6 mL/min (A) (by C-G formula based on SCr of 1.26 mg/dL (H)).   Medical History: Past Medical History:  Diagnosis Date  . Anemia    takes iron pill  . Anxiety   . Arthritis   . Depression   . Diabetes mellitus without complication (HCC)   . GERD (gastroesophageal reflux disease)    pepto  . H/O hiatal hernia   . Pneumonia    4 years ago  . Post-nasal drip   . Prostate hyperplasia, benign localized, without urinary obstruction   . Seasonal allergies   . Sleep apnea    no CPAP    Assessment: 84 year old male known to pharmacy for antibiotic dosing now to start IV Heparin for ACS/chest pain. Pharmacy consulted to dose.   Troponin has increased to 2.76 this evening. Initial heparin level this evening came back therapeutic at 0.38, on 800 units/hr. CBC stable. No s/sx of bleeding. No infusion issues.     Patient was not on anticoagulation prior to admission.   Goal  of Therapy:  Heparin level 0.3-0.7 units/ml Monitor platelets by anticoagulation protocol: Yes   Plan:  Continue heparin infusion at 800 units/hr Monitor heparin level with daily labs Monitor daily Heparin level, CBC, and s/sx of bleeding  Sherron MondayKimberly Talik Casique, PharmD, BCCCP Clinical Pharmacist  Pager: [phone] Phone: [phone] Please refer to [facility] for [facility] numbers 06/20/2018,9:52 PM

## 2018-06-20 NOTE — Consult Note (Signed)
Referring Physician:  [name] is an 84 y.o. male.                       Chief Complaint: Syncope  HPI: 84 year old male came from [place] after syncopal episode, fall and having cough and cold with suspected pneumonia. His cardiac troponin are mildly elevated with ischemic changes on EKG. Patient endorses chest pain with cough.   Past Medical History:  Diagnosis Date  . Anemia    takes iron pill  . Anxiety   . Arthritis   . Depression   . Diabetes mellitus without complication (HCC)   . GERD (gastroesophageal reflux disease)    pepto  . H/O hiatal hernia   . Pneumonia    4 years ago  . Post-nasal drip   . Prostate hyperplasia, benign localized, without urinary obstruction   . Seasonal allergies   . Sleep apnea    no CPAP      Past Surgical History:  Procedure Laterality Date  . BACK SURGERY  1995   discectomy  . COLONOSCOPY W/ BIOPSIES AND POLYPECTOMY    . EYE SURGERY Bilateral    cataracts  . EYE SURGERY Bilateral    glaucoma shunts  . JOINT REPLACEMENT Left 1992   knee  . JOINT REPLACEMENT Right 1996   knee  . JOINT REPLACEMENT Right 2009   hip  . JOINT REPLACEMENT Left 2005   hip  . KNEE ARTHROSCOPY Left 1981  . KNEE ARTHROSCOPY WITH PATELLA RECONSTRUCTION Right 2010  . KNEE SURGERY Bilateral 1954   knee surgery and placed in a cast for 6 weeks  . KNEE SURGERY  1961   cartilage removed  . LEG SURGERY Bilateral 1974, 1975   straighten legs  . SHOULDER OPEN ROTATOR CUFF REPAIR Right 2010  . TONSILLECTOMY    . TOTAL HIP ARTHROPLASTY Right 01/13/2014   Procedure: TOTAL HIP ARTHROPLASTY ANTERIOR APPROACH;  Surgeon: Velna Ochs, MD;  Location: [facility] OR;  Service: Orthopedics;  Laterality: Right;    No family history on file. Social History:  reports that he has quit smoking. His smoking use included cigarettes. He has a 18.00 pack-year smoking history. He has never used smokeless tobacco. He reports current alcohol use. He reports  that he does not use drugs.  Allergies:  Allergies  Allergen Reactions  . Penicillins Hives    DID THE REACTION INVOLVE: Swelling of the face/tongue/throat, SOB, or low BP? No Sudden or severe rash/hives, skin peeling, or the inside of the mouth or nose? No Did it require medical treatment? No When did it last happen?childhood allergy If all above answers are "NO", may proceed with cephalosporin use.   . Sulfa Antibiotics Hives    (Not in a hospital admission)   Results for orders placed or performed during the hospital encounter of 06/20/18 (from the past 48 hour(s))  CBC with Differential     Status: Abnormal   Collection Time: 06/20/18  6:00 AM  Result Value Ref Range   WBC 12.1 (H) 4.0 - 10.5 K/uL   RBC 4.50 4.22 - 5.81 MIL/uL   Hemoglobin 12.0 (L) 13.0 - 17.0 g/dL   HCT 28.4 (L) 13.2 - 44.0 %   MCV 82.2 80.0 - 100.0 fL   MCH 26.7 26.0 - 34.0 pg   MCHC 32.4 30.0 - 36.0 g/dL   RDW 10.2 (H) 72.5 - 36.6 %   Platelets 171 150 - 400 K/uL   nRBC  0.0 0.0 - 0.2 %   Neutrophils Relative % 82 %   Neutro Abs 10.0 (H) 1.7 - 7.7 K/uL   Lymphocytes Relative 5 %   Lymphs Abs 0.6 (L) 0.7 - 4.0 K/uL   Monocytes Relative 12 %   Monocytes Absolute 1.4 (H) 0.1 - 1.0 K/uL   Eosinophils Relative 0 %   Eosinophils Absolute 0.0 0.0 - 0.5 K/uL   Basophils Relative 0 %   Basophils Absolute 0.0 0.0 - 0.1 K/uL   Immature Granulocytes 1 %   Abs Immature Granulocytes 0.06 0.00 - 0.07 K/uL    Comment: Performed at [facility], [address] [place], [place] [address]  Comprehensive metabolic panel     Status: Abnormal   Collection Time: 06/20/18  6:00 AM  Result Value Ref Range   Sodium 135 135 - 145 mmol/L   Potassium 3.5 3.5 - 5.1 mmol/L   Chloride 100 98 - 111 mmol/L   CO2 20 (L) 22 - 32 mmol/L   Glucose, Bld 462 (H) 70 - 99 mg/dL   BUN 22 8 - 23 mg/dL   Creatinine, Ser 6.04 (H) 0.61 - 1.24 mg/dL   Calcium 8.5 (L) 8.9 - 10.3 mg/dL   Total Protein 6.8 6.5 - 8.1 g/dL    Albumin 3.7 3.5 - 5.0 g/dL   AST 33 15 - 41 U/L   ALT 17 0 - 44 U/L   Alkaline Phosphatase 66 38 - 126 U/L   Total Bilirubin 0.6 0.3 - 1.2 mg/dL   GFR calc non Af Amer 52 (L) >60 mL/min   GFR calc Af Amer >60 >60 mL/min   Anion gap 15 5 - 15    Comment: Performed at [facility], [address], [place], [place] [address]  CBG monitoring, ED     Status: Abnormal   Collection Time: 06/20/18  6:05 AM  Result Value Ref Range   Glucose-Capillary 421 (H) 70 - 99 mg/dL  I-stat troponin, ED     Status: Abnormal   Collection Time: 06/20/18  6:24 AM  Result Value Ref Range   Troponin i, poc 1.08 (HH) 0.00 - 0.08 ng/mL   Comment NOTIFIED PHYSICIAN    Comment 3            Comment: Due to the release kinetics of cTnI, a negative result within the first hours of the onset of symptoms does not rule out myocardial infarction with certainty. If myocardial infarction is still suspected, repeat the test at appropriate intervals.   I-Stat CG4 Lactic Acid, ED     Status: Abnormal   Collection Time: 06/20/18  6:26 AM  Result Value Ref Range   Lactic Acid, Venous 2.98 (HH) 0.5 - 1.9 mmol/L   Comment NOTIFIED PHYSICIAN   Urinalysis, Routine w reflex microscopic     Status: Abnormal   Collection Time: 06/20/18  7:24 AM  Result Value Ref Range   Color, Urine YELLOW YELLOW   APPearance CLEAR CLEAR   Specific Gravity, Urine 1.026 1.005 - 1.030   pH 5.0 5.0 - 8.0   Glucose, UA >=500 (A) NEGATIVE mg/dL   Hgb urine dipstick MODERATE (A) NEGATIVE   Bilirubin Urine NEGATIVE NEGATIVE   Ketones, ur 20 (A) NEGATIVE mg/dL   Protein, ur 30 (A) NEGATIVE mg/dL   Nitrite NEGATIVE NEGATIVE   Leukocytes, UA NEGATIVE NEGATIVE   RBC / HPF 0-5 0 - 5 RBC/hpf   WBC, UA 0-5 0 - 5 WBC/hpf   Bacteria,  UA NONE SEEN NONE SEEN   Mucus PRESENT    Hyaline Casts, UA PRESENT     Comment: Performed at [facility], [address], [place], [place] [address]  Influenza panel by PCR (type A & B)     Status: None    Collection Time: 06/20/18  7:24 AM  Result Value Ref Range   Influenza A By PCR NEGATIVE NEGATIVE   Influenza B By PCR NEGATIVE NEGATIVE    Comment: (NOTE) The Xpert Xpress Flu assay is intended as an aid in the diagnosis of  influenza and should not be used as a sole basis for treatment.  This  assay is FDA approved for nasopharyngeal swab specimens only. Nasal  washings and aspirates are unacceptable for Xpert Xpress Flu testing. Performed at [facility], [address] [place], [place] [address]   I-Stat Troponin, ED (not at [facility])     Status: Abnormal   Collection Time: 06/20/18  9:24 AM  Result Value Ref Range   Troponin i, poc 1.10 (HH) 0.00 - 0.08 ng/mL   Comment NOTIFIED PHYSICIAN    Comment 3            Comment: Due to the release kinetics of cTnI, a negative result within the first hours of the onset of symptoms does not rule out myocardial infarction with certainty. If myocardial infarction is still suspected, repeat the test at appropriate intervals.   I-Stat CG4 Lactic Acid, ED     Status: None   Collection Time: 06/20/18  9:27 AM  Result Value Ref Range   Lactic Acid, Venous 1.45 0.5 - 1.9 mmol/L  I-Stat Troponin, ED (not at [facility])     Status: Abnormal   Collection Time: 06/20/18 12:13 PM  Result Value Ref Range   Troponin i, poc 2.64 (HH) 0.00 - 0.08 ng/mL   Comment NOTIFIED PHYSICIAN    Comment 3            Comment: Due to the release kinetics of cTnI, a negative result within the first hours of the onset of symptoms does not rule out myocardial infarction with certainty. If myocardial infarction is still suspected, repeat the test at appropriate intervals.    Dg Chest 2 View  Result Date: 06/20/2018 CLINICAL DATA:  Dyspnea. EXAM: CHEST - 2 VIEW COMPARISON:  10/17/2015 FINDINGS: Low volume chest with accentuates heart size. Heart size is normal on the lateral view. Elevation of the right diaphragm that is chronic in from eventration based on  the lateral view. There is no edema, consolidation, effusion, or pneumothorax. Rounded density overlapping the midthoracic spine is stable from 2017 and attributed to lateral osteophyte IMPRESSION: Low volume chest without acute finding. Electronically Signed   By: Marnee Spring M.D.   On: 06/20/2018 07:08   Ct Head Wo Contrast  Result Date: 06/20/2018 CLINICAL DATA:  Patient status post syncopal episode. EXAM: CT HEAD WITHOUT CONTRAST CT CERVICAL SPINE WITHOUT CONTRAST TECHNIQUE: Multidetector CT imaging of the head and cervical spine was performed following the standard protocol without intravenous contrast. Multiplanar CT image reconstructions of the cervical spine were also generated. COMPARISON:  Brain and cervical spine CT 04/15/2018 FINDINGS: CT HEAD FINDINGS Brain: Unchanged bilateral chronic thalamic lacunar infarcts. Unchanged right basal ganglia lacunar infarcts. Unchanged left basal ganglia calcifications. No evidence for acute cortically based infarct, intracranial hemorrhage, mass lesion or mass-effect. Vascular: Unremarkable Skull: Intact. Sinuses/Orbits: Mucosal thickening involving the right maxillary sinus, ethmoid air cells. Mastoid air cells are unremarkable.  Orbits are unremarkable. Other: None. CT CERVICAL SPINE FINDINGS Alignment: Multilevel mild anterolisthesis of the cervical spine C3-4 through C6-7, stable from prior and likely secondary to degenerative changes at the facet joints. Skull base and vertebrae: No acute fracture. No primary bone lesion or focal pathologic process. Incomplete C1 ring posteriorly. Soft tissues and spinal canal: No prevertebral fluid or swelling. No visible canal hematoma. Disc levels: Multilevel facet degenerative changes. No acute fracture. Upper chest: Unremarkable. Other: Similar-appearing 1.6 cm low-attenuation nodule left thyroid lobe. IMPRESSION: No acute intracranial process. Small chronic bilateral thalamic lacunar infarcts. No acute cervical spine  fracture. Electronically Signed   By: Annia Beltrew  Davis M.D.   On: 06/20/2018 08:27   Ct Angio Chest Pe W And/or Wo Contrast  Result Date: 06/20/2018 CLINICAL DATA:  84 year old with a syncopal episode causing fall. Shortness of breath. EXAM: CT ANGIOGRAPHY CHEST WITH CONTRAST TECHNIQUE: Multidetector CT imaging of the chest was performed using the standard protocol during bolus administration of intravenous contrast. Multiplanar CT image reconstructions and MIPs were obtained to evaluate the vascular anatomy. CONTRAST:  60mL ISOVUE-370 IOPAMIDOL (ISOVUE-370) INJECTION 76% COMPARISON:  Chest radiograph 06/20/2018 and abdominal CT 09/07/2012 FINDINGS: Cardiovascular: Satisfactory opacification of the pulmonary arteries to the segmental level. No evidence of pulmonary embolism. Normal heart size. No pericardial effusion. Few atherosclerotic calcifications in the thoracic aorta without aortic enlargement. There are coronary artery calcifications, particularly in the LAD. Mediastinum/Nodes: Small hiatal hernia and similar to 2014. No significant lymph node enlargement in the mediastinum or hila. No axillary lymph node enlargement. Lungs/Pleura: Trachea and mainstem bronchi are patent. No pleural effusions. Poorly defined nodule along the posterior right lower lobe on sequence 6, image 74. This measures up to 7 mm based on the reformatted images. There was a similar nodular density in this area in 2014. This is most compatible with a benign finding. Scattered areas of atelectasis or air trapping particularly in the right lower lobe. No significant lung consolidation or airspace disease. Upper Abdomen: Again noted is a 1.8 cm low attenuating structure in the left hepatic lobe and not significantly changed since 2014. Possible right kidney stone which is incompletely evaluated. Fatty infiltration in the pancreas. 9 mm density at the base of the gallbladder suggestive for a gallstone. No gallbladder distension. Soft tissue  nodule medial to the spleen is most compatible with a splenule and stable. No acute findings in the upper abdomen. Musculoskeletal: Multilevel degenerative changes in thoracic spine. No acute abnormality. Review of the MIP images confirms the above findings. IMPRESSION: 1. Negative for a pulmonary embolism. 2. No acute chest abnormality. 3. Patchy densities in the lungs are compatible with atelectasis and mild air trapping. No significant lung consolidation or airspace disease. 4. Stable incidental findings in the chest and abdomen as described. 5. Cholelithiasis. 6. Aortic Atherosclerosis (ICD10-I70.0). Coronary artery calcifications. 7. Possible right kidney stone. Electronically Signed   By: Richarda OverlieAdam  Henn M.D.   On: 06/20/2018 08:34   Ct Cervical Spine Wo Contrast  Result Date: 06/20/2018 CLINICAL DATA:  Patient status post syncopal episode. EXAM: CT HEAD WITHOUT CONTRAST CT CERVICAL SPINE WITHOUT CONTRAST TECHNIQUE: Multidetector CT imaging of the head and cervical spine was performed following the standard protocol without intravenous contrast. Multiplanar CT image reconstructions of the cervical spine were also generated. COMPARISON:  Brain and cervical spine CT 04/15/2018 FINDINGS: CT HEAD FINDINGS Brain: Unchanged bilateral chronic thalamic lacunar infarcts. Unchanged right basal ganglia lacunar infarcts. Unchanged left basal ganglia calcifications. No evidence for acute cortically based infarct,  intracranial hemorrhage, mass lesion or mass-effect. Vascular: Unremarkable Skull: Intact. Sinuses/Orbits: Mucosal thickening involving the right maxillary sinus, ethmoid air cells. Mastoid air cells are unremarkable. Orbits are unremarkable. Other: None. CT CERVICAL SPINE FINDINGS Alignment: Multilevel mild anterolisthesis of the cervical spine C3-4 through C6-7, stable from prior and likely secondary to degenerative changes at the facet joints. Skull base and vertebrae: No acute fracture. No primary bone lesion  or focal pathologic process. Incomplete C1 ring posteriorly. Soft tissues and spinal canal: No prevertebral fluid or swelling. No visible canal hematoma. Disc levels: Multilevel facet degenerative changes. No acute fracture. Upper chest: Unremarkable. Other: Similar-appearing 1.6 cm low-attenuation nodule left thyroid lobe. IMPRESSION: No acute intracranial process. Small chronic bilateral thalamic lacunar infarcts. No acute cervical spine fracture. Electronically Signed   By: Annia Belt M.D.   On: 06/20/2018 08:27   Ct Renal Stone Study  Result Date: 06/20/2018 CLINICAL DATA:  84 year old with hematuria, unknown cause. EXAM: CT ABDOMEN AND PELVIS WITHOUT CONTRAST TECHNIQUE: Multidetector CT imaging of the abdomen and pelvis was performed following the standard protocol without IV contrast. COMPARISON:  CT abdomen 09/07/2012.  Chest CT 06/20/2018 FINDINGS: Lower chest: Stable 7 mm nodular density in the right lower lobe on sequence 4, image 14 and no significant change since 2014. Few patchy densities in the right lower lobe are most compatible with atelectasis. No pleural effusions. Hepatobiliary: Chronic 1.6 cm low-density structure in left hepatic lobe probably represents a cyst and no significant change since 2014. Small stone in the gallbladder measuring roughly 8 mm. No evidence for gallbladder dilatation or inflammation. No biliary dilatation. Pancreas: Fatty infiltration of the pancreas without inflammation or duct dilatation. Spleen: Normal in size without focal abnormality. Adrenals/Urinary Tract: Normal adrenal glands. Limited evaluation for nonobstructive kidney stones due to the recent contrast administration from the chest CTA. There is contrast in the renal collecting systems and urinary bladder. Urinary bladder is not distended but there are multiple small foci of contrast invaginating into the bladder wall and suggestive for bladder trabeculation. Proximal and distal right ureter is opacified  with contrast. The mid right ureter does not have contrast. Contrast in the left ureter without suspicious lesion. Negative for hydronephrosis or ureter dilatation. No suspicious renal lesions on this examination. Stomach/Bowel: Small hiatal hernia. Extensive diverticulosis in the sigmoid colon and distal descending colon without inflammatory changes. There appears to be in normal small appendix. No evidence for bowel dilatation or obstruction. Stomach is unremarkable. Vascular/Lymphatic: Atherosclerotic calcifications in the abdominal aorta without aneurysm. No lymph node enlargement in the abdomen or pelvis. Reproductive: Limited evaluation of the prostate due to streak artifact from the bilateral hip replacements. Other: Negative for free air. No free fluid. Small umbilical hernia containing fat. Musculoskeletal: Bilateral hip replacements are located. Extensive facet arthropathy in the lower lumbar spine. Multiple disc levels with vacuum disc phenomenon. IMPRESSION: 1. Limited evaluation for kidney stones due to the recent chest CTA and contrast administration. Recent chest CTA suggested there may be a right renal calculus but this could not be confirmed on this examination. Negative for hydronephrosis. No suspicious findings in the urinary tract. 2. Evidence for bladder trabeculation. Limited evaluation of the prostate. 3. Multilevel degenerative disease in the lumbar spine. 4. Cholelithiasis.  No biliary dilatation. Electronically Signed   By: Richarda Overlie M.D.   On: 06/20/2018 11:47    Review Of Systems Constitutional: Positive fever, chills, no weight loss or gain. Eyes: No vision change, wears glasses. No discharge or pain. Ears: No  hearing loss, No tinnitus. Respiratory: No asthma, COPD, pneumonias. Positive shortness of breath. No hemoptysis. Cardiovascular: Positive chest pain, palpitation, leg edema. Gastrointestinal: No nausea, vomiting, diarrhea, constipation. No GI bleed. No  hepatitis. Genitourinary: No dysuria, hematuria, kidney stone. No incontinance. Neurological: No headache, stroke, seizures.  Psychiatry: No psych facility admission for anxiety, depression, suicide. No detox. Skin: No rash. Musculoskeletal: Positive joint pain, no fibromyalgia. No neck pain, back pain. Lymphadenopathy: No lymphadenopathy. Hematology: No anemia or easy bruising.   Blood pressure 120/77, pulse 99, temperature 98.8 F (37.1 C), temperature source Oral, resp. rate (!) 22, height 5' (1.524 m), weight 74.8 kg, SpO2 98 %. Body mass index is 32.22 kg/m. General appearance: alert, cooperative, appears stated age and mild respiratory distress Head: Normocephalic, atraumatic. Eyes: Blue eyes, pink conjunctiva, corneas clear. PERRL, EOM's intact. Neck: No adenopathy, no carotid bruit, no JVD, supple, symmetrical, trachea midline and thyroid not enlarged. Resp: Basal crackles to auscultation bilaterally. No rhonchi. Cardio: Regular rate and rhythm, S1, S2 normal, II/VI systolic murmur, no click, rub or gallop GI: Soft, non-tender; bowel sounds normal; no organomegaly. Extremities: No edema, cyanosis or clubbing. Skin: Warm and dry.  Neurologic: Alert and oriented X 3, Moves all 4 ext.   Assessment/Plan Syncope Abnormal Troponin I NSTEMI CAD Possible pneumonia Uncontrolled DM type 2 Possible kidney stone  Echocardiogram for LV function. Encourage medical treatment. Stabilize blood sugars.   Ricki RodriguezAjay S Dia Donate, MD  06/20/2018, 1:13 PM

## 2018-06-20 NOTE — ED Notes (Signed)
Patient transported to CT 

## 2018-06-21 ENCOUNTER — Inpatient Hospital Stay (HOSPITAL_COMMUNITY): Payer: Medicare Other

## 2018-06-21 DIAGNOSIS — R55 Syncope and collapse: Secondary | ICD-10-CM

## 2018-06-21 DIAGNOSIS — I214 Non-ST elevation (NSTEMI) myocardial infarction: Principal | ICD-10-CM

## 2018-06-21 DIAGNOSIS — N400 Enlarged prostate without lower urinary tract symptoms: Secondary | ICD-10-CM

## 2018-06-21 LAB — GLUCOSE, CAPILLARY
GLUCOSE-CAPILLARY: 260 mg/dL — AB (ref 70–99)
GLUCOSE-CAPILLARY: 169 mg/dL — AB (ref 70–99)
Glucose-Capillary: 215 mg/dL — ABNORMAL HIGH (ref 70–99)
Glucose-Capillary: 179 mg/dL — ABNORMAL HIGH (ref 70–99)
Glucose-Capillary: 213 mg/dL — ABNORMAL HIGH (ref 70–99)

## 2018-06-21 LAB — CBC
HCT: 32 % — ABNORMAL LOW (ref 39.0–52.0)
Hemoglobin: 10 g/dL — ABNORMAL LOW (ref 13.0–17.0)
MCH: 25.6 pg — ABNORMAL LOW (ref 26.0–34.0)
MCHC: 31.3 g/dL (ref 30.0–36.0)
MCV: 81.8 fL (ref 80.0–100.0)
NRBC: 0 % (ref 0.0–0.2)
Platelets: 137 10*3/uL — ABNORMAL LOW (ref 150–400)
RBC: 3.91 MIL/uL — ABNORMAL LOW (ref 4.22–5.81)
RDW: 17.5 % — AB (ref 11.5–15.5)
WBC: 8.7 10*3/uL (ref 4.0–10.5)

## 2018-06-21 LAB — BASIC METABOLIC PANEL
Anion gap: 12 (ref 5–15)
BUN: 18 mg/dL (ref 8–23)
CO2: 19 mmol/L — ABNORMAL LOW (ref 22–32)
Calcium: 7.3 mg/dL — ABNORMAL LOW (ref 8.9–10.3)
Chloride: 105 mmol/L (ref 98–111)
Creatinine, Ser: 1.12 mg/dL (ref 0.61–1.24)
GFR calc Af Amer: >60 mL/min (ref >60)
GFR calc non Af Amer: >60 mL/min (ref >60)
Glucose, Bld: 248 mg/dL — ABNORMAL HIGH (ref 70–99)
Potassium: 3.3 mmol/L — ABNORMAL LOW (ref 3.5–5.1)
SODIUM: 136 mmol/L (ref 135–145)

## 2018-06-21 LAB — HEPARIN LEVEL (UNFRACTIONATED)
Heparin Unfractionated: 0.24 IU/mL — ABNORMAL LOW (ref 0.30–0.70)
Heparin Unfractionated: 0.27 IU/mL — ABNORMAL LOW (ref 0.30–0.70)
Heparin Unfractionated: 0.32 IU/mL (ref 0.30–0.70)

## 2018-06-21 LAB — TROPONIN I: Troponin I: 2.1 ng/mL (ref <0.03)

## 2018-06-21 LAB — ECHOCARDIOGRAM COMPLETE
Height: 60 in
Weight: 2620.8 oz

## 2018-06-21 MED ORDER — METOPROLOL TARTRATE 25 MG PO TABS
25.0000 mg | ORAL_TABLET | Freq: Two times a day (BID) | ORAL | Status: DC
  Administered 2018-06-21 – 2018-06-22 (×2): 25 mg via ORAL
  Filled 2018-06-21 (×2): qty 1

## 2018-06-21 MED ORDER — IPRATROPIUM-ALBUTEROL 0.5-2.5 (3) MG/3ML IN SOLN
3.0000 mL | Freq: Two times a day (BID) | RESPIRATORY_TRACT | Status: DC
  Administered 2018-06-22 – 2018-06-26 (×8): 3 mL via RESPIRATORY_TRACT
  Filled 2018-06-21 (×9): qty 3

## 2018-06-21 MED ORDER — GUAIFENESIN ER 600 MG PO TB12
600.0000 mg | ORAL_TABLET | Freq: Two times a day (BID) | ORAL | Status: DC
  Administered 2018-06-21 – 2018-06-26 (×10): 600 mg via ORAL
  Filled 2018-06-21 (×10): qty 1

## 2018-06-21 MED ORDER — POTASSIUM CHLORIDE ER 10 MEQ PO TBCR
10.0000 meq | EXTENDED_RELEASE_TABLET | Freq: Two times a day (BID) | ORAL | Status: DC
  Administered 2018-06-21 – 2018-06-23 (×5): 10 meq via ORAL
  Filled 2018-06-21 (×9): qty 1

## 2018-06-21 MED ORDER — METHYLPREDNISOLONE SODIUM SUCC 125 MG IJ SOLR
60.0000 mg | Freq: Four times a day (QID) | INTRAMUSCULAR | Status: DC
  Administered 2018-06-21 – 2018-06-22 (×4): 60 mg via INTRAVENOUS
  Filled 2018-06-21 (×4): qty 2

## 2018-06-21 MED ORDER — ACETAMINOPHEN 325 MG PO TABS
650.0000 mg | ORAL_TABLET | Freq: Four times a day (QID) | ORAL | Status: DC | PRN
  Administered 2018-06-21: 650 mg via ORAL

## 2018-06-21 MED ORDER — DOXYCYCLINE HYCLATE 100 MG PO TABS
100.0000 mg | ORAL_TABLET | Freq: Two times a day (BID) | ORAL | Status: DC
  Administered 2018-06-21 – 2018-06-26 (×10): 100 mg via ORAL
  Filled 2018-06-21 (×11): qty 1

## 2018-06-21 MED ORDER — IPRATROPIUM-ALBUTEROL 0.5-2.5 (3) MG/3ML IN SOLN
3.0000 mL | Freq: Four times a day (QID) | RESPIRATORY_TRACT | Status: DC
  Administered 2018-06-21 (×2): 3 mL via RESPIRATORY_TRACT
  Filled 2018-06-21 (×2): qty 3

## 2018-06-21 NOTE — Progress Notes (Signed)
  Echocardiogram 2D Echocardiogram has been performed.  Pieter PartridgeBrooke S Zeek Rostron 06/21/2018, 3:28 PM

## 2018-06-21 NOTE — Progress Notes (Signed)
[facility]  PROGRESS NOTE  Matthew Calderon:[id number] DOB: November 28, 1932 DOA: 06/20/2018 PCP: Merri Brunette, MD   Brief HPI:   84 year old male with a history of obstructive sleep apnea not on therapy allergic rhinitis, BPH, GERD, type 2 diabetes mellitus, depression, arthritis, anemia who came to hospital after patient apparently passed out in the bathroom.  Patient says that he remembers falling down and describes symptoms more like near syncope.  In the ED blood work showed elevated troponin and patient was started on IV heparin for non-STEMI.  Cardiology was consulted.    Subjective   Patient seen and examined, denies chest pain or shortness of breath today.  Still complains of coughing phlegm.   Assessment/Plan:     1. Non-STEMI-patient admitted with elevated troponin, non-STEMI.  Heparin GTT started.  Cardiology has been consulted.  Echocardiogram done today, results pending.  Troponin is trending downwards.  2.72-2.76-2.10  2. Acute bronchitis-patient has symptoms of acute bronchitis, bilateral wheezing, IV antibiotics vancomycin and cefepime were started yesterday which have been discontinued.  Will start doxycycline, duo nebs, Mucinex. 3. Diabetes mellitus type 2-continue sliding scale insulin with NovoLog, CBG well controlled.  Will hold metformin.  4. Near syncope-patient is describing symptoms of postural hypotension, will check orthostatic vital signs every shift.  5. BPH-continue Myrbetriq, Uroxatrol  6. Insomnia-continue trazodone  7. Hypokalemia-potassium was 3.3, will replace potassium and check BMP in a.m.     CBG: Recent Labs  Lab 06/20/18 1830 06/20/18 2054 06/21/18 0608 06/21/18 0743 06/21/18 1146  GLUCAP 172* 254* 215* 260* 179*    CBC: Recent Labs  Lab 06/20/18 0600 06/21/18 0313  WBC 12.1* 8.7  NEUTROABS 10.0*  --   HGB 12.0* 10.0*  HCT 37.0* 32.0*  MCV 82.2 81.8  PLT 171 137*    Basic Metabolic Panel: Recent Labs  Lab  06/20/18 0600 06/21/18 0313  NA 135 136  K 3.5 3.3*  CL 100 105  CO2 20* 19*  GLUCOSE 462* 248*  BUN 22 18  CREATININE 1.26* 1.12  CALCIUM 8.5* 7.3*     DVT prophylaxis: Heparin GTT  Code Status: Full code  Family Communication: No family at bedside  Disposition Plan: Skilled nursing facility once medically stable   Consultants:  Cardiology  Procedures:  None   Antibiotics:   Anti-infectives (From admission, onward)   Start     Dose/Rate Route Frequency Ordered Stop   06/21/18 2200  vancomycin (VANCOCIN) IVPB 750 mg/150 ml premix  Status:  Discontinued     750 mg 150 mL/hr over 60 Minutes Intravenous Every 36 hours 06/20/18 1012 06/20/18 1525   06/21/18 1000  ceFEPIme (MAXIPIME) 2 g in sodium chloride 0.9 % 100 mL IVPB  Status:  Discontinued     2 g 200 mL/hr over 30 Minutes Intravenous Every 24 hours 06/20/18 1012 06/20/18 1525   06/20/18 1015  ceFEPIme (MAXIPIME) 2 g in sodium chloride 0.9 % 100 mL IVPB     2 g 200 mL/hr over 30 Minutes Intravenous  Once 06/20/18 1000 06/20/18 1206   06/20/18 0900  vancomycin (VANCOCIN) 1,500 mg in sodium chloride 0.9 % 500 mL IVPB  Status:  Discontinued     1,500 mg 250 mL/hr over 120 Minutes Intravenous  Once 06/20/18 0850 06/20/18 0851   06/20/18 0900  vancomycin (VANCOCIN) 1,500 mg in sodium chloride 0.9 % 500 mL IVPB     1,500 mg 250 mL/hr over 120 Minutes Intravenous  Once 06/20/18 0851 06/20/18 1205  Objective   Vitals:   06/20/18 2004 06/21/18 0427 06/21/18 0928 06/21/18 1411  BP: 118/79 128/89 125/69 (!) 94/58  Pulse: 100 (!) 101 (!) 110 97  Resp: 17 19 14  (!) 23  Temp:  98.5 F (36.9 C)  (!) 97.4 F (36.3 C)  TempSrc:  Oral  Oral  SpO2: 95% 99% 93% 96%  Weight:  74.3 kg    Height:        Intake/Output Summary (Last 24 hours) at 06/21/2018 1556 Last data filed at 06/21/2018 1300 Gross per 24 hour  Intake 1176 ml  Output 1185 ml  Net -9 ml   Filed Weights   06/20/18 0528 06/20/18 1853  06/21/18 0427  Weight: 74.8 kg 74.4 kg 74.3 kg     Physical Examination:    General: Appears in no acute distress  Cardiovascular: S1-S2, regular, no murmur auscultated  Respiratory: Bilateral wheezing auscultated  Abdomen: Soft, nontender, no organomegaly  Extremities: No edema of the lower extremities  Neurologic: Alert, oriented x3, no focal deficit noted     Data Reviewed: I have personally reviewed following labs and imaging studies   Recent Results (from the past 240 hour(s))  Blood culture (routine x 2)     Status: None (Preliminary result)   Collection Time: 06/20/18  6:30 AM  Result Value Ref Range Status   Specimen Description BLOOD RIGHT ANTECUBITAL  Final   Special Requests   Final    BOTTLES DRAWN AEROBIC AND ANAEROBIC Blood Culture adequate volume   Culture   Final    NO GROWTH 1 DAY Performed at [facility], [address] [place], [place] [address]    Report Status PENDING  Incomplete  Blood culture (routine x 2)     Status: None (Preliminary result)   Collection Time: 06/20/18  6:35 AM  Result Value Ref Range Status   Specimen Description BLOOD RIGHT HAND  Final   Special Requests   Final    BOTTLES DRAWN AEROBIC AND ANAEROBIC Blood Culture adequate volume   Culture   Final    NO GROWTH 1 DAY Performed at [facility], [address] [place], [place] [address]    Report Status PENDING  Incomplete     Liver Function Tests: Recent Labs  Lab 06/20/18 0600  AST 33  ALT 17  ALKPHOS 66  BILITOT 0.6  PROT 6.8  ALBUMIN 3.7   No results for input(s): LIPASE, AMYLASE in the last 168 hours. No results for input(s): AMMONIA in the last 168 hours.  Cardiac Enzymes: Recent Labs  Lab 06/20/18 1531 06/20/18 2035 06/21/18 0313  TROPONINI 2.72* 2.76* 2.10*   BNP (last 3 results) No results for input(s): BNP in the last 8760 hours.  ProBNP (last 3 results) No results for input(s): PROBNP in the last 8760  hours.    Studies: Dg Chest 2 View  Result Date: 06/20/2018 CLINICAL DATA:  Dyspnea. EXAM: CHEST - 2 VIEW COMPARISON:  10/17/2015 FINDINGS: Low volume chest with accentuates heart size. Heart size is normal on the lateral view. Elevation of the right diaphragm that is chronic in from eventration based on the lateral view. There is no edema, consolidation, effusion, or pneumothorax. Rounded density overlapping the midthoracic spine is stable from 2017 and attributed to lateral osteophyte IMPRESSION: Low volume chest without acute finding. Electronically Signed   By: Marnee SpringJonathon  Watts M.D.   On: 06/20/2018 07:08   Ct Head Wo Contrast  Result Date: 06/20/2018 CLINICAL DATA:  Patient  status post syncopal episode. EXAM: CT HEAD WITHOUT CONTRAST CT CERVICAL SPINE WITHOUT CONTRAST TECHNIQUE: Multidetector CT imaging of the head and cervical spine was performed following the standard protocol without intravenous contrast. Multiplanar CT image reconstructions of the cervical spine were also generated. COMPARISON:  Brain and cervical spine CT 04/15/2018 FINDINGS: CT HEAD FINDINGS Brain: Unchanged bilateral chronic thalamic lacunar infarcts. Unchanged right basal ganglia lacunar infarcts. Unchanged left basal ganglia calcifications. No evidence for acute cortically based infarct, intracranial hemorrhage, mass lesion or mass-effect. Vascular: Unremarkable Skull: Intact. Sinuses/Orbits: Mucosal thickening involving the right maxillary sinus, ethmoid air cells. Mastoid air cells are unremarkable. Orbits are unremarkable. Other: None. CT CERVICAL SPINE FINDINGS Alignment: Multilevel mild anterolisthesis of the cervical spine C3-4 through C6-7, stable from prior and likely secondary to degenerative changes at the facet joints. Skull base and vertebrae: No acute fracture. No primary bone lesion or focal pathologic process. Incomplete C1 ring posteriorly. Soft tissues and spinal canal: No prevertebral fluid or swelling. No  visible canal hematoma. Disc levels: Multilevel facet degenerative changes. No acute fracture. Upper chest: Unremarkable. Other: Similar-appearing 1.6 cm low-attenuation nodule left thyroid lobe. IMPRESSION: No acute intracranial process. Small chronic bilateral thalamic lacunar infarcts. No acute cervical spine fracture. Electronically Signed   By: Annia Beltrew  Davis M.D.   On: 06/20/2018 08:27   Ct Angio Chest Pe W And/or Wo Contrast  Result Date: 06/20/2018 CLINICAL DATA:  84 year old with a syncopal episode causing fall. Shortness of breath. EXAM: CT ANGIOGRAPHY CHEST WITH CONTRAST TECHNIQUE: Multidetector CT imaging of the chest was performed using the standard protocol during bolus administration of intravenous contrast. Multiplanar CT image reconstructions and MIPs were obtained to evaluate the vascular anatomy. CONTRAST:  60mL ISOVUE-370 IOPAMIDOL (ISOVUE-370) INJECTION 76% COMPARISON:  Chest radiograph 06/20/2018 and abdominal CT 09/07/2012 FINDINGS: Cardiovascular: Satisfactory opacification of the pulmonary arteries to the segmental level. No evidence of pulmonary embolism. Normal heart size. No pericardial effusion. Few atherosclerotic calcifications in the thoracic aorta without aortic enlargement. There are coronary artery calcifications, particularly in the LAD. Mediastinum/Nodes: Small hiatal hernia and similar to 2014. No significant lymph node enlargement in the mediastinum or hila. No axillary lymph node enlargement. Lungs/Pleura: Trachea and mainstem bronchi are patent. No pleural effusions. Poorly defined nodule along the posterior right lower lobe on sequence 6, image 74. This measures up to 7 mm based on the reformatted images. There was a similar nodular density in this area in 2014. This is most compatible with a benign finding. Scattered areas of atelectasis or air trapping particularly in the right lower lobe. No significant lung consolidation or airspace disease. Upper Abdomen: Again noted  is a 1.8 cm low attenuating structure in the left hepatic lobe and not significantly changed since 2014. Possible right kidney stone which is incompletely evaluated. Fatty infiltration in the pancreas. 9 mm density at the base of the gallbladder suggestive for a gallstone. No gallbladder distension. Soft tissue nodule medial to the spleen is most compatible with a splenule and stable. No acute findings in the upper abdomen. Musculoskeletal: Multilevel degenerative changes in thoracic spine. No acute abnormality. Review of the MIP images confirms the above findings. IMPRESSION: 1. Negative for a pulmonary embolism. 2. No acute chest abnormality. 3. Patchy densities in the lungs are compatible with atelectasis and mild air trapping. No significant lung consolidation or airspace disease. 4. Stable incidental findings in the chest and abdomen as described. 5. Cholelithiasis. 6. Aortic Atherosclerosis (ICD10-I70.0). Coronary artery calcifications. 7. Possible right kidney stone. Electronically Signed  By: Richarda Overlie M.D.   On: 06/20/2018 08:34   Ct Cervical Spine Wo Contrast  Result Date: 06/20/2018 CLINICAL DATA:  Patient status post syncopal episode. EXAM: CT HEAD WITHOUT CONTRAST CT CERVICAL SPINE WITHOUT CONTRAST TECHNIQUE: Multidetector CT imaging of the head and cervical spine was performed following the standard protocol without intravenous contrast. Multiplanar CT image reconstructions of the cervical spine were also generated. COMPARISON:  Brain and cervical spine CT 04/15/2018 FINDINGS: CT HEAD FINDINGS Brain: Unchanged bilateral chronic thalamic lacunar infarcts. Unchanged right basal ganglia lacunar infarcts. Unchanged left basal ganglia calcifications. No evidence for acute cortically based infarct, intracranial hemorrhage, mass lesion or mass-effect. Vascular: Unremarkable Skull: Intact. Sinuses/Orbits: Mucosal thickening involving the right maxillary sinus, ethmoid air cells. Mastoid air cells are  unremarkable. Orbits are unremarkable. Other: None. CT CERVICAL SPINE FINDINGS Alignment: Multilevel mild anterolisthesis of the cervical spine C3-4 through C6-7, stable from prior and likely secondary to degenerative changes at the facet joints. Skull base and vertebrae: No acute fracture. No primary bone lesion or focal pathologic process. Incomplete C1 ring posteriorly. Soft tissues and spinal canal: No prevertebral fluid or swelling. No visible canal hematoma. Disc levels: Multilevel facet degenerative changes. No acute fracture. Upper chest: Unremarkable. Other: Similar-appearing 1.6 cm low-attenuation nodule left thyroid lobe. IMPRESSION: No acute intracranial process. Small chronic bilateral thalamic lacunar infarcts. No acute cervical spine fracture. Electronically Signed   By: Annia Belt M.D.   On: 06/20/2018 08:27   Ct Renal Stone Study  Result Date: 06/20/2018 CLINICAL DATA:  84 year old with hematuria, unknown cause. EXAM: CT ABDOMEN AND PELVIS WITHOUT CONTRAST TECHNIQUE: Multidetector CT imaging of the abdomen and pelvis was performed following the standard protocol without IV contrast. COMPARISON:  CT abdomen 09/07/2012.  Chest CT 06/20/2018 FINDINGS: Lower chest: Stable 7 mm nodular density in the right lower lobe on sequence 4, image 14 and no significant change since 2014. Few patchy densities in the right lower lobe are most compatible with atelectasis. No pleural effusions. Hepatobiliary: Chronic 1.6 cm low-density structure in left hepatic lobe probably represents a cyst and no significant change since 2014. Small stone in the gallbladder measuring roughly 8 mm. No evidence for gallbladder dilatation or inflammation. No biliary dilatation. Pancreas: Fatty infiltration of the pancreas without inflammation or duct dilatation. Spleen: Normal in size without focal abnormality. Adrenals/Urinary Tract: Normal adrenal glands. Limited evaluation for nonobstructive kidney stones due to the recent  contrast administration from the chest CTA. There is contrast in the renal collecting systems and urinary bladder. Urinary bladder is not distended but there are multiple small foci of contrast invaginating into the bladder wall and suggestive for bladder trabeculation. Proximal and distal right ureter is opacified with contrast. The mid right ureter does not have contrast. Contrast in the left ureter without suspicious lesion. Negative for hydronephrosis or ureter dilatation. No suspicious renal lesions on this examination. Stomach/Bowel: Small hiatal hernia. Extensive diverticulosis in the sigmoid colon and distal descending colon without inflammatory changes. There appears to be in normal small appendix. No evidence for bowel dilatation or obstruction. Stomach is unremarkable. Vascular/Lymphatic: Atherosclerotic calcifications in the abdominal aorta without aneurysm. No lymph node enlargement in the abdomen or pelvis. Reproductive: Limited evaluation of the prostate due to streak artifact from the bilateral hip replacements. Other: Negative for free air. No free fluid. Small umbilical hernia containing fat. Musculoskeletal: Bilateral hip replacements are located. Extensive facet arthropathy in the lower lumbar spine. Multiple disc levels with vacuum disc phenomenon. IMPRESSION: 1. Limited evaluation for  kidney stones due to the recent chest CTA and contrast administration. Recent chest CTA suggested there may be a right renal calculus but this could not be confirmed on this examination. Negative for hydronephrosis. No suspicious findings in the urinary tract. 2. Evidence for bladder trabeculation. Limited evaluation of the prostate. 3. Multilevel degenerative disease in the lumbar spine. 4. Cholelithiasis.  No biliary dilatation. Electronically Signed   By: Richarda Overlie M.D.   On: 06/20/2018 11:47    Scheduled Meds: . acetaminophen  1,000 mg Oral BID  . alfuzosin  10 mg Oral Q breakfast  . brimonidine  1 drop  Both Eyes BID  . ferrous sulfate  325 mg Oral Daily  . insulin aspart  0-15 Units Subcutaneous TID WC  . insulin aspart  0-5 Units Subcutaneous QHS  . Melatonin  3 tablet Oral QHS  . metFORMIN  1,500 mg Oral Q breakfast  . mirabegron ER  50 mg Oral Daily  . potassium chloride  10 mEq Oral BID  . sodium chloride flush  3 mL Intravenous Q12H  . timolol  1 drop Both Eyes BID  . traZODone  50 mg Oral QHS    Admission status: Inpatient: Based on patients clinical presentation and evaluation of above clinical data, I have made determination that patient meets Inpatient criteria at this time.  Patient will need more than 2 midnights stay in the hospital, he has non-STEMI, started on IV heparin.  Cardiology following.  Time spent: 25 min  Meredeth Ide   [facility] Pager [phone]. If 7PM-7AM, please contact night-coverage at [email or web address], Office  [phone]  password [email or web address]  06/21/2018, 3:56 PM  LOS: 1 day

## 2018-06-21 NOTE — Progress Notes (Addendum)
ANTICOAGULATION CONSULT NOTE   Pharmacy Consult for Heparin Indication: chest pain/ACS  Allergies  Allergen Reactions  . Penicillins Hives    DID THE REACTION INVOLVE: Swelling of the face/tongue/throat, SOB, or low BP? No Sudden or severe rash/hives, skin peeling, or the inside of the mouth or nose? No Did it require medical treatment? No When did it last happen?childhood allergy If all above answers are "NO", may proceed with cephalosporin use.   . Sulfa Antibiotics Hives    Patient Measurements: Height: 5' (152.4 cm) Weight: 163 lb 12.8 oz (74.3 kg) IBW/kg (Calculated) : 50 Heparin Dosing Weight: 66.2 kg  Vital Signs: Temp: 98.5 F (36.9 C) (12/29 0427) Temp Source: Oral (12/29 0427) BP: 125/69 (12/29 0928) Pulse Rate: 110 (12/29 0928)  Labs: Recent Labs    06/20/18 0600 06/20/18 1531 06/20/18 2035 06/21/18 0313 06/21/18 1141  HGB 12.0*  --   --  10.0*  --   HCT 37.0*  --   --  32.0*  --   PLT 171  --   --  137*  --   HEPARINUNFRC  --   --  0.38 0.24* 0.27*  CREATININE 1.26*  --   --  1.12  --   TROPONINI  --  2.72* 2.76* 2.10*  --     Estimated Creatinine Clearance: 42.2 mL/min (by C-G formula based on SCr of 1.12 mg/dL).   Medical History: Past Medical History:  Diagnosis Date  . Anemia    takes iron pill  . Anxiety   . Arthritis   . Depression   . Diabetes mellitus without complication (HCC)   . GERD (gastroesophageal reflux disease)    pepto  . H/O hiatal hernia   . Pneumonia    4 years ago  . Post-nasal drip   . Prostate hyperplasia, benign localized, without urinary obstruction   . Seasonal allergies   . Sleep apnea    no CPAP    Assessment: 84 year old male known to pharmacy for antibiotic dosing now to start IV Heparin for ACS/chest pain. Pharmacy consulted to dose.   Troponin peaked at 2.76 this evening- now down to 2.1. Heparin level came back slightly subtherapeutic at 0.27, on 900 units/hr. Hgb 10, plt 137. No s/sx of  bleeding. No infusion issues.    Patient was not on anticoagulation prior to admission.   Goal of Therapy:  Heparin level 0.3-0.7 units/ml Monitor platelets by anticoagulation protocol: Yes   Plan:  Increase heparin infusion at 1000 units/hr Order heparin level in 8 hours Monitor heparin level with daily labs Monitor daily Heparin level, CBC, and s/sx of bleeding  Sherron MondayKimberly Coline Calkin, PharmD, BCCCP Clinical Pharmacist  Pager: [phone] Phone: [phone] Please refer to AMION for [facility] numbers 06/21/2018,12:31 PM  ADDENDUM Heparin level came back therapeutic at 0.32, on 1000 units/hr. No s/sx of bleeding. No infusion issues. CBC stable. Increase heparin infusion to 1050 units/hr to keep in goal range and monitor levels with morning labs.  Sherron MondayKimberly Derhonda Eastlick, PharmD, BCCCP Clinical Pharmacist

## 2018-06-21 NOTE — Progress Notes (Signed)
   06/21/18 1810  Vitals  Temp 100.2 F (37.9 C)  Temp Source Oral  ECG Heart Rate (!) 119  Resp (!) 40  Oxygen Therapy  SpO2 98 %  O2 Device Room Air  Pain Assessment  Pain Scale 0-10  Pain Score 0      The MD Sharl MaLama was notified for temp, lots of coughing, and elevated HR. Ordered Solu med IV and Tylenol. I will continue to monitor the patient closely.   Cammy CopaAbigail RN

## 2018-06-21 NOTE — Consult Note (Signed)
Ref: Merri BrunettePharr, Walter, MD   Subjective:  Afebrile. Monitor-sinus tachycardia off and on. Echocardiogram shows severe LV systolic dysfunction mostly anterior and apical severe hypokinesia. Family and patient want medical treatment only.  Objective:  Vital Signs in the last 24 hours: Temp:  [97.4 F (36.3 C)-98.7 F (37.1 C)] 97.4 F (36.3 C) (12/29 1411) Pulse Rate:  [88-117] 88 (12/29 1609) Cardiac Rhythm: Sinus tachycardia (12/29 0928) Resp:  [14-26] 25 (12/29 1609) BP: (94-129)/(58-89) 120/82 (12/29 1609) SpO2:  [93 %-99 %] 99 % (12/29 1609) Weight:  [74.3 kg-74.4 kg] 74.3 kg (12/29 0427)  Physical Exam: BP Readings from Last 1 Encounters:  06/21/18 120/82     Wt Readings from Last 1 Encounters:  06/21/18 74.3 kg    Weight change: -0.408 kg Body mass index is 31.99 kg/m. HEENT: [place]/AT, Eyes-Blue, PERL, EOMI, Conjunctiva-Pink, Sclera-Non-icteric Neck: No JVD, No bruit, Trachea midline. Lungs:  Basal crackles, Bilateral. Cardiac:  Irregular rhythm, normal S1 and S2, no S3. II/VI systolic murmur. Abdomen:  Soft, non-tender. BS present. Extremities:  No edema present. No cyanosis. No clubbing. CNS: AxOx3, Cranial nerves grossly intact, moves all 4 extremities.  Skin: Warm and dry.   Intake/Output from previous day: 12/28 0701 - 12/29 0700 In: 2056 [P.O.:360; I.V.:96; IV Piggyback:1600] Out: 1185 [Urine:1185]    Lab Results: BMET    Component Value Date/Time   NA 136 06/21/2018 0313   NA 135 06/20/2018 0600   NA 133 (L) 01/14/2014 0600   K 3.3 (L) 06/21/2018 0313   K 3.5 06/20/2018 0600   K 4.2 01/14/2014 0600   CL 105 06/21/2018 0313   CL 100 06/20/2018 0600   CL 97 01/14/2014 0600   CO2 19 (L) 06/21/2018 0313   CO2 20 (L) 06/20/2018 0600   CO2 23 01/14/2014 0600   GLUCOSE 248 (H) 06/21/2018 0313   GLUCOSE 462 (H) 06/20/2018 0600   GLUCOSE 304 (H) 01/14/2014 0600   BUN 18 06/21/2018 0313   BUN 22 06/20/2018 0600   BUN 14 01/14/2014 0600   CREATININE 1.12  06/21/2018 0313   CREATININE 1.26 (H) 06/20/2018 0600   CREATININE 0.75 01/14/2014 0600   CALCIUM 7.3 (L) 06/21/2018 0313   CALCIUM 8.5 (L) 06/20/2018 0600   CALCIUM 8.4 01/14/2014 0600   GFRNONAA >60 06/21/2018 0313   GFRNONAA 52 (L) 06/20/2018 0600   GFRNONAA 85 (L) 01/14/2014 0600   GFRAA >60 06/21/2018 0313   GFRAA >60 06/20/2018 0600   GFRAA >90 01/14/2014 0600   CBC    Component Value Date/Time   WBC 8.7 06/21/2018 0313   RBC 3.91 (L) 06/21/2018 0313   HGB 10.0 (L) 06/21/2018 0313   HCT 32.0 (L) 06/21/2018 0313   PLT 137 (L) 06/21/2018 0313   MCV 81.8 06/21/2018 0313   MCH 25.6 (L) 06/21/2018 0313   MCHC 31.3 06/21/2018 0313   RDW 17.5 (H) 06/21/2018 0313   LYMPHSABS 0.6 (L) 06/20/2018 0600   MONOABS 1.4 (H) 06/20/2018 0600   EOSABS 0.0 06/20/2018 0600   BASOSABS 0.0 06/20/2018 0600   HEPATIC Function Panel Recent Labs    06/20/18 0600  PROT 6.8   HEMOGLOBIN A1C No components found for: HGA1C,  MPG CARDIAC ENZYMES Lab Results  Component Value Date   TROPONINI 2.10 (HH) 06/21/2018   TROPONINI 2.76 (HH) 06/20/2018   TROPONINI 2.72 (HH) 06/20/2018   BNP No results for input(s): PROBNP in the last 8760 hours. TSH No results for input(s): TSH in the last 8760 hours. CHOLESTEROL No results  for input(s): CHOL in the last 8760 hours.  Scheduled Meds: . acetaminophen  1,000 mg Oral BID  . alfuzosin  10 mg Oral Q breakfast  . brimonidine  1 drop Both Eyes BID  . doxycycline  100 mg Oral Q12H  . ferrous sulfate  325 mg Oral Daily  . guaiFENesin  600 mg Oral BID  . insulin aspart  0-15 Units Subcutaneous TID WC  . insulin aspart  0-5 Units Subcutaneous QHS  . ipratropium-albuterol  3 mL Nebulization Q6H  . Melatonin  3 tablet Oral QHS  . mirabegron ER  50 mg Oral Daily  . potassium chloride  10 mEq Oral BID  . sodium chloride flush  3 mL Intravenous Q12H  . timolol  1 drop Both Eyes BID  . traZODone  50 mg Oral QHS   Continuous Infusions: . heparin  1,000 Units/hr (06/21/18 1242)   PRN Meds:.benzonatate, chlorpheniramine-HYDROcodone, ondansetron **OR** ondansetron (ZOFRAN) IV, oxyCODONE  Assessment/Plan: NSTEMI Near syncope Ischemic cardiomyopathy CAD Possible pneumonia/Bronchitis Uncontrolled DM type 2 Possible renal stone  Add Metoprolol for heart rate control and MI. Medical treatment per patient and family.   LOS: 1 day    Orpah CobbAjay Suheyb Raucci  MD  06/21/2018, 5:08 PM

## 2018-06-21 NOTE — Progress Notes (Signed)
ANTICOAGULATION CONSULT NOTE   Pharmacy Consult for Heparin Indication: chest pain/ACS  Allergies  Allergen Reactions  . Penicillins Hives    DID THE REACTION INVOLVE: Swelling of the face/tongue/throat, SOB, or low BP? No Sudden or severe rash/hives, skin peeling, or the inside of the mouth or nose? No Did it require medical treatment? No When did it last happen?childhood allergy If all above answers are "NO", may proceed with cephalosporin use.   . Sulfa Antibiotics Hives    Patient Measurements: Height: 5' (152.4 cm) Weight: 163 lb 12.8 oz (74.3 kg) IBW/kg (Calculated) : 50 Heparin Dosing Weight: 66.2 kg  Vital Signs: Temp: 98.5 F (36.9 C) (12/29 0427) Temp Source: Oral (12/29 0427) BP: 128/89 (12/29 0427) Pulse Rate: 101 (12/29 0427)  Labs: Recent Labs    06/20/18 0600 06/20/18 1531 06/20/18 2035 06/21/18 0313  HGB 12.0*  --   --  10.0*  HCT 37.0*  --   --  32.0*  PLT 171  --   --  137*  HEPARINUNFRC  --   --  0.38 0.24*  CREATININE 1.26*  --   --   --   TROPONINI  --  2.72* 2.76*  --     Estimated Creatinine Clearance: 37.5 mL/min (A) (by C-G formula based on SCr of 1.26 mg/dL (H)).   Assessment: Heparin initiated for ACS.  Heparin level this am slightly low.  No issues noted with infusion    Goal of Therapy:  Heparin level 0.3-0.7 units/ml Monitor platelets by anticoagulation protocol: Yes   Plan:  Increase heparin infusion to 900 units/hr Check heparin level later today to confirm therapeutic Monitor daily Heparin level, CBC, and s/sx of bleeding  Thanks for allowing pharmacy to be a part of this patient's care.  Talbert CageLora Jeanann Balinski, PharmD Clinical Pharmacist

## 2018-06-22 DIAGNOSIS — R739 Hyperglycemia, unspecified: Secondary | ICD-10-CM

## 2018-06-22 LAB — CBC
HEMATOCRIT: 31 % — AB (ref 39.0–52.0)
HEMOGLOBIN: 10.1 g/dL — AB (ref 13.0–17.0)
MCH: 26.6 pg (ref 26.0–34.0)
MCHC: 32.6 g/dL (ref 30.0–36.0)
MCV: 81.6 fL (ref 80.0–100.0)
Platelets: 136 10*3/uL — ABNORMAL LOW (ref 150–400)
RBC: 3.8 MIL/uL — ABNORMAL LOW (ref 4.22–5.81)
RDW: 17.4 % — ABNORMAL HIGH (ref 11.5–15.5)
WBC: 7.6 10*3/uL (ref 4.0–10.5)
nRBC: 0 % (ref 0.0–0.2)

## 2018-06-22 LAB — GLUCOSE, CAPILLARY
Glucose-Capillary: 247 mg/dL — ABNORMAL HIGH (ref 70–99)
Glucose-Capillary: 313 mg/dL — ABNORMAL HIGH (ref 70–99)
Glucose-Capillary: 315 mg/dL — ABNORMAL HIGH (ref 70–99)
Glucose-Capillary: 397 mg/dL — ABNORMAL HIGH (ref 70–99)

## 2018-06-22 LAB — BASIC METABOLIC PANEL
Anion gap: 15 (ref 5–15)
BUN: 18 mg/dL (ref 8–23)
CALCIUM: 7.1 mg/dL — AB (ref 8.9–10.3)
CO2: 15 mmol/L — AB (ref 22–32)
Chloride: 102 mmol/L (ref 98–111)
Creatinine, Ser: 1.15 mg/dL (ref 0.61–1.24)
GFR calc Af Amer: >60 mL/min (ref >60)
GFR calc non Af Amer: 59 mL/min — ABNORMAL LOW (ref >60)
Glucose, Bld: 265 mg/dL — ABNORMAL HIGH (ref 70–99)
Potassium: 4.1 mmol/L (ref 3.5–5.1)
Sodium: 132 mmol/L — ABNORMAL LOW (ref 135–145)

## 2018-06-22 LAB — HEPARIN LEVEL (UNFRACTIONATED): Heparin Unfractionated: 0.53 IU/mL (ref 0.30–0.70)

## 2018-06-22 MED ORDER — METOPROLOL TARTRATE 50 MG PO TABS
50.0000 mg | ORAL_TABLET | Freq: Two times a day (BID) | ORAL | Status: DC
  Administered 2018-06-22 – 2018-06-23 (×2): 50 mg via ORAL
  Filled 2018-06-22 (×2): qty 1

## 2018-06-22 MED ORDER — INSULIN GLARGINE 100 UNIT/ML ~~LOC~~ SOLN
12.0000 [IU] | Freq: Every day | SUBCUTANEOUS | Status: DC
  Administered 2018-06-22: 12 [IU] via SUBCUTANEOUS
  Filled 2018-06-22 (×3): qty 0.12

## 2018-06-22 MED ORDER — METHYLPREDNISOLONE SODIUM SUCC 40 MG IJ SOLR
40.0000 mg | Freq: Four times a day (QID) | INTRAMUSCULAR | Status: DC
  Administered 2018-06-22 – 2018-06-23 (×4): 40 mg via INTRAVENOUS
  Filled 2018-06-22 (×4): qty 1

## 2018-06-22 MED ORDER — SACUBITRIL-VALSARTAN 24-26 MG PO TABS
1.0000 | ORAL_TABLET | Freq: Two times a day (BID) | ORAL | Status: DC
  Administered 2018-06-22 – 2018-06-25 (×7): 1 via ORAL
  Filled 2018-06-22 (×9): qty 1

## 2018-06-22 NOTE — Consult Note (Signed)
Ref: Merri BrunettePharr, Walter, MD   Subjective:  Breathing is improving. T max 100.2 degree F. No chest pain. Tolerating B-blocker.  Objective:  Vital Signs in the last 24 hours: Temp:  [97.5 F (36.4 C)-100.2 F (37.9 C)] 97.5 F (36.4 C) (12/30 1446) Pulse Rate:  [61-119] 100 (12/30 0809) Cardiac Rhythm: Atrial fibrillation (12/30 0809) Resp:  [15-40] 15 (12/30 0500) BP: (95-117)/(56-70) 116/56 (12/30 0809) SpO2:  [92 %-100 %] 100 % (12/30 0809) Weight:  [74.9 kg] 74.9 kg (12/30 0500)  Physical Exam: BP Readings from Last 1 Encounters:  06/22/18 (!) 116/56     Wt Readings from Last 1 Encounters:  06/22/18 74.9 kg    Weight change: 0.454 kg Body mass index is 32.24 kg/m. HEENT: [place]/AT, Eyes-Blue, PERL, EOMI, Conjunctiva-Pink, Sclera-Non-icteric Neck: No JVD, No bruit, Trachea midline. Lungs:  Clearing, Bilateral. Cardiac:  Irregular rhythm, normal S1 and S2, no S3. II/VI systolic murmur. Abdomen:  Soft, non-tender. BS present. Extremities:  No edema present. No cyanosis. No clubbing. CNS: AxOx3, Cranial nerves grossly intact, moves all 4 extremities.  Skin: Warm and dry.   Intake/Output from previous day: 12/29 0701 - 12/30 0700 In: 1309.8 [P.O.:1200; I.V.:109.8] Out: 850 [Urine:850]    Lab Results: BMET    Component Value Date/Time   NA 132 (L) 06/22/2018 0354   NA 136 06/21/2018 0313   NA 135 06/20/2018 0600   K 4.1 06/22/2018 0354   K 3.3 (L) 06/21/2018 0313   K 3.5 06/20/2018 0600   CL 102 06/22/2018 0354   CL 105 06/21/2018 0313   CL 100 06/20/2018 0600   CO2 15 (L) 06/22/2018 0354   CO2 19 (L) 06/21/2018 0313   CO2 20 (L) 06/20/2018 0600   GLUCOSE 265 (H) 06/22/2018 0354   GLUCOSE 248 (H) 06/21/2018 0313   GLUCOSE 462 (H) 06/20/2018 0600   BUN 18 06/22/2018 0354   BUN 18 06/21/2018 0313   BUN 22 06/20/2018 0600   CREATININE 1.15 06/22/2018 0354   CREATININE 1.12 06/21/2018 0313   CREATININE 1.26 (H) 06/20/2018 0600   CALCIUM 7.1 (L) 06/22/2018 0354   CALCIUM 7.3 (L) 06/21/2018 0313   CALCIUM 8.5 (L) 06/20/2018 0600   GFRNONAA 59 (L) 06/22/2018 0354   GFRNONAA >60 06/21/2018 0313   GFRNONAA 52 (L) 06/20/2018 0600   GFRAA >60 06/22/2018 0354   GFRAA >60 06/21/2018 0313   GFRAA >60 06/20/2018 0600   CBC    Component Value Date/Time   WBC 7.6 06/22/2018 0354   RBC 3.80 (L) 06/22/2018 0354   HGB 10.1 (L) 06/22/2018 0354   HCT 31.0 (L) 06/22/2018 0354   PLT 136 (L) 06/22/2018 0354   MCV 81.6 06/22/2018 0354   MCH 26.6 06/22/2018 0354   MCHC 32.6 06/22/2018 0354   RDW 17.4 (H) 06/22/2018 0354   LYMPHSABS 0.6 (L) 06/20/2018 0600   MONOABS 1.4 (H) 06/20/2018 0600   EOSABS 0.0 06/20/2018 0600   BASOSABS 0.0 06/20/2018 0600   HEPATIC Function Panel Recent Labs    06/20/18 0600  PROT 6.8   HEMOGLOBIN A1C No components found for: HGA1C,  MPG CARDIAC ENZYMES Lab Results  Component Value Date   TROPONINI 2.10 (HH) 06/21/2018   TROPONINI 2.76 (HH) 06/20/2018   TROPONINI 2.72 (HH) 06/20/2018   BNP No results for input(s): PROBNP in the last 8760 hours. TSH No results for input(s): TSH in the last 8760 hours. CHOLESTEROL No results for input(s): CHOL in the last 8760 hours.  Scheduled Meds: . alfuzosin  10  mg Oral Q breakfast  . brimonidine  1 drop Both Eyes BID  . doxycycline  100 mg Oral Q12H  . ferrous sulfate  325 mg Oral Daily  . guaiFENesin  600 mg Oral BID  . insulin aspart  0-15 Units Subcutaneous TID WC  . insulin aspart  0-5 Units Subcutaneous QHS  . insulin glargine  12 Units Subcutaneous QHS  . ipratropium-albuterol  3 mL Nebulization BID  . Melatonin  3 tablet Oral QHS  . methylPREDNISolone (SOLU-MEDROL) injection  40 mg Intravenous Q6H  . metoprolol tartrate  50 mg Oral BID  . mirabegron ER  50 mg Oral Daily  . potassium chloride  10 mEq Oral BID  . sodium chloride flush  3 mL Intravenous Q12H  . timolol  1 drop Both Eyes BID  . traZODone  50 mg Oral QHS   Continuous Infusions: . heparin 1,050  Units/hr (06/22/18 0814)   PRN Meds:.acetaminophen, benzonatate, chlorpheniramine-HYDROcodone, ondansetron **OR** ondansetron (ZOFRAN) IV, oxyCODONE  Assessment/Plan: NSTEMI Near syncope Ischemic cardiomyopathy CAD Acute bronchitis Uncontrolled DM type 2 H/O renal stone  Start Entresto due to significant LV systolic dysfunction, NYHA class II-III   LOS: 2 days    Orpah CobbAjay Sheylin Scharnhorst  MD  06/22/2018, 5:28 PM

## 2018-06-22 NOTE — Progress Notes (Signed)
Inpatient Diabetes Program Recommendations  AACE/ADA: New Consensus Statement on Inpatient Glycemic Control (2015)  Target Ranges:  Prepandial:   less than 140 mg/dL      Peak postprandial:   less than 180 mg/dL (1-2 hours)      Critically ill patients:  140 - 180 mg/dL   Lab Results  Component Value Date   GLUCAP 313 (H) 06/22/2018   HGBA1C 7.6 (H) 06/20/2018    Review of Glycemic Control Results for Zelphia CairoKELLY, Theodus B (MRN [id number]) as of 06/22/2018 11:33  Ref. Range 06/21/2018 16:26 06/21/2018 21:27 06/22/2018 07:44 06/22/2018 11:24  Glucose-Capillary Latest Ref Range: 70 - 99 mg/dL 409169 (H) 811213 (H) 914247 (H) 313 (H)   Diabetes history: Type 2 DM Outpatient Diabetes medications: Metformin 1500 mg QAM Current orders for Inpatient glycemic control: Novolog 0-15 units TID, Novolog 0-5 units QHS Solumedrol 60 mg Q6H  Inpatient Diabetes Program Recommendations:    In the setting of steroids, consider adding Lantus 12 units QD.   Additionally, if post prandials continue to exceed 180 mg/dL, consider adding Novolog 3 units TID (assuming that patient is consuming >50% of meals).   Thanks, Lujean RaveLauren Jamacia Jester, MSN, RNC-OB Diabetes Coordinator [phone] (8a-5p)

## 2018-06-22 NOTE — Progress Notes (Signed)
[facility]  PROGRESS NOTE  Matthew Calderon [id number] DOB: [birth year] DOA: 06/20/2018 PCP: Merri BrunettePharr, Walter, MD   Brief HPI:   84 year old male with a history of obstructive sleep apnea not on therapy allergic rhinitis, BPH, GERD, type 2 diabetes mellitus, depression, arthritis, anemia who came to hospital after patient apparently passed out in the bathroom.  Patient says that he remembers falling down and describes symptoms more like near syncope.  In the ED blood work showed elevated troponin and patient was started on IV heparin for non-STEMI.  Cardiology was consulted.    Subjective   Patient seen and examined, still complains of coughing.  Denies chest pain or shortness of breath.   Assessment/Plan:     1. Non-STEMI-patient admitted with elevated troponin, non-STEMI.  Heparin GTT started.  Cardiology  consulted.  Echocardiogram done yesterday showed severe hypokinesis of the mid apical anteroseptal and anterior myocardium, there is severe hypokinesis of the entire apical myocardium.  Family has opted for medical management only.   Troponin is trending downwards.    2. Acute bronchitis-improved, patient presented with symptoms of acute bronchitis, bilateral wheezing, IV antibiotics vancomycin and cefepime were started yesterday which have been discontinued.  Started on doxycycline, duo nebs, Mucinex.  Started on Solu-Medrol 60 mg IV every 6 hours.  Will cut down the dose to 40 mg every 6 hours.  3. Diabetes mellitus type 2-continue sliding scale insulin with NovoLog, CBG now elevated due to steroids.  Will start Lantus 12 units subcu daily.  4. Near syncope-patient is describing symptoms of postural hypotension, will check orthostatic vital signs every shift.  5. BPH-continue Myrbetriq, Uroxatrol  6. Insomnia-continue trazodone  7. Hypokalemia-replete     CBG: Recent Labs  Lab 06/21/18 1146 06/21/18 1626 06/21/18 2127 06/22/18 0744 06/22/18 1124  GLUCAP 179*  169* 213* 247* 313*    CBC: Recent Labs  Lab 06/20/18 0600 06/21/18 0313 06/22/18 0354  WBC 12.1* 8.7 7.6  NEUTROABS 10.0*  --   --   HGB 12.0* 10.0* 10.1*  HCT 37.0* 32.0* 31.0*  MCV 82.2 81.8 81.6  PLT 171 137* 136*    Basic Metabolic Panel: Recent Labs  Lab 06/20/18 0600 06/21/18 0313 06/22/18 0354  NA 135 136 132*  K 3.5 3.3* 4.1  CL 100 105 102  CO2 20* 19* 15*  GLUCOSE 462* 248* 265*  BUN 22 18 18   CREATININE 1.26* 1.12 1.15  CALCIUM 8.5* 7.3* 7.1*     DVT prophylaxis: Heparin GTT  Code Status: Full code  Family Communication: No family at bedside  Disposition Plan: Skilled nursing facility once medically stable   Consultants:  Cardiology  Procedures:  None   Antibiotics:   Anti-infectives (From admission, onward)   Start     Dose/Rate Route Frequency Ordered Stop   06/21/18 2200  vancomycin (VANCOCIN) IVPB 750 mg/150 ml premix  Status:  Discontinued     750 mg 150 mL/hr over 60 Minutes Intravenous Every 36 hours 06/20/18 1012 06/20/18 1525   06/21/18 2200  doxycycline (VIBRA-TABS) tablet 100 mg     100 mg Oral Every 12 hours 06/21/18 1602     06/21/18 1000  ceFEPIme (MAXIPIME) 2 g in sodium chloride 0.9 % 100 mL IVPB  Status:  Discontinued     2 g 200 mL/hr over 30 Minutes Intravenous Every 24 hours 06/20/18 1012 06/20/18 1525   06/20/18 1015  ceFEPIme (MAXIPIME) 2 g in sodium chloride 0.9 % 100 mL IVPB     2 g  200 mL/hr over 30 Minutes Intravenous  Once 06/20/18 1000 06/20/18 1206   06/20/18 0900  vancomycin (VANCOCIN) 1,500 mg in sodium chloride 0.9 % 500 mL IVPB  Status:  Discontinued     1,500 mg 250 mL/hr over 120 Minutes Intravenous  Once 06/20/18 0850 06/20/18 0851   06/20/18 0900  vancomycin (VANCOCIN) 1,500 mg in sodium chloride 0.9 % 500 mL IVPB     1,500 mg 250 mL/hr over 120 Minutes Intravenous  Once 06/20/18 0851 06/20/18 1205       Objective   Vitals:   06/22/18 0050 06/22/18 0500 06/22/18 0752 06/22/18 0809  BP:  102/65 114/69  (!) 116/56  Pulse: 61 89  100  Resp: (!) 28 15    Temp:  97.6 F (36.4 C)    TempSrc:  Oral    SpO2: 97% 96% 95% 100%  Weight:  74.9 kg    Height:        Intake/Output Summary (Last 24 hours) at 06/22/2018 1435 Last data filed at 06/22/2018 0902 Gross per 24 hour  Intake 949.83 ml  Output 1100 ml  Net -150.17 ml   Filed Weights   06/20/18 1853 06/21/18 0427 06/22/18 0500  Weight: 74.4 kg 74.3 kg 74.9 kg     Physical Examination:   General: Appears in no acute distress  Cardiovascular: S1-S2, regular  Respiratory: Rhonchi auscultated at left lung base, decreased breath sounds in right lung base  Abdomen: Soft, nontender, no organomegaly  Musculoskeletal: No edema in the lower extremities    Data Reviewed: I have personally reviewed following labs and imaging studies   Recent Results (from the past 240 hour(s))  Blood culture (routine x 2)     Status: None (Preliminary result)   Collection Time: 06/20/18  6:30 AM  Result Value Ref Range Status   Specimen Description BLOOD RIGHT ANTECUBITAL  Final   Special Requests   Final    BOTTLES DRAWN AEROBIC AND ANAEROBIC Blood Culture adequate volume   Culture   Final    NO GROWTH 1 DAY Performed at [facility], [address] [place], [place] [address]    Report Status PENDING  Incomplete  Blood culture (routine x 2)     Status: None (Preliminary result)   Collection Time: 06/20/18  6:35 AM  Result Value Ref Range Status   Specimen Description BLOOD RIGHT HAND  Final   Special Requests   Final    BOTTLES DRAWN AEROBIC AND ANAEROBIC Blood Culture adequate volume   Culture   Final    NO GROWTH 1 DAY Performed at [facility], [address] [place], [place] [address]    Report Status PENDING  Incomplete     Liver Function Tests: Recent Labs  Lab 06/20/18 0600  AST 33  ALT 17  ALKPHOS 66  BILITOT 0.6  PROT 6.8  ALBUMIN 3.7   No results for input(s): LIPASE, AMYLASE in the  last 168 hours. No results for input(s): AMMONIA in the last 168 hours.  Cardiac Enzymes: Recent Labs  Lab 06/20/18 1531 06/20/18 2035 06/21/18 0313  TROPONINI 2.72* 2.76* 2.10*   BNP (last 3 results) No results for input(s): BNP in the last 8760 hours.  ProBNP (last 3 results) No results for input(s): PROBNP in the last 8760 hours.    Studies: No results found.  Scheduled Meds: . alfuzosin  10 mg Oral Q breakfast  . brimonidine  1 drop Both Eyes BID  . doxycycline  100  mg Oral Q12H  . ferrous sulfate  325 mg Oral Daily  . guaiFENesin  600 mg Oral BID  . insulin aspart  0-15 Units Subcutaneous TID WC  . insulin aspart  0-5 Units Subcutaneous QHS  . ipratropium-albuterol  3 mL Nebulization BID  . Melatonin  3 tablet Oral QHS  . methylPREDNISolone (SOLU-MEDROL) injection  60 mg Intravenous Q6H  . metoprolol tartrate  50 mg Oral BID  . mirabegron ER  50 mg Oral Daily  . potassium chloride  10 mEq Oral BID  . sodium chloride flush  3 mL Intravenous Q12H  . timolol  1 drop Both Eyes BID  . traZODone  50 mg Oral QHS    Admission status: Inpatient: Based on patients clinical presentation and evaluation of above clinical data, I have made determination that patient meets Inpatient criteria at this time.  Patient will need more than 2 midnights stay in the hospital, he has non-STEMI, started on IV heparin.  Cardiology following.  Time spent: 25 min  Meredeth Ide   [facility] Pager [phone]. If 7PM-7AM, please contact night-coverage at [email or web address], Office  [phone]  password [email or web address]  06/22/2018, 2:35 PM  LOS: 2 days

## 2018-06-22 NOTE — Progress Notes (Signed)
ANTICOAGULATION CONSULT NOTE   Pharmacy Consult for Heparin Indication: chest pain/ACS  Allergies  Allergen Reactions  . Penicillins Hives    DID THE REACTION INVOLVE: Swelling of the face/tongue/throat, SOB, or low BP? No Sudden or severe rash/hives, skin peeling, or the inside of the mouth or nose? No Did it require medical treatment? No When did it last happen?childhood allergy If all above answers are "NO", may proceed with cephalosporin use.   . Sulfa Antibiotics Hives    Patient Measurements: Height: 5' (152.4 cm) Weight: 165 lb 1.6 oz (74.9 kg) IBW/kg (Calculated) : 50 Heparin Dosing Weight: 66.2 kg  Vital Signs: Temp: 97.6 F (36.4 C) (12/30 0500) Temp Source: Oral (12/30 0500) BP: 116/56 (12/30 0809) Pulse Rate: 100 (12/30 0809)  Labs: Recent Labs    06/20/18 0600 06/20/18 1531  06/20/18 2035 06/21/18 0313 06/21/18 1141 06/21/18 1957 06/22/18 0354  HGB 12.0*  --   --   --  10.0*  --   --  10.1*  HCT 37.0*  --   --   --  32.0*  --   --  31.0*  PLT 171  --   --   --  137*  --   --  136*  HEPARINUNFRC  --   --    < > 0.38 0.24* 0.27* 0.32 0.53  CREATININE 1.26*  --   --   --  1.12  --   --  1.15  TROPONINI  --  2.72*  --  2.76* 2.10*  --   --   --    < > = values in this interval not displayed.    Estimated Creatinine Clearance: 41.3 mL/min (by C-G formula based on SCr of 1.15 mg/dL).   Medical History: Past Medical History:  Diagnosis Date  . Anemia    takes iron pill  . Anxiety   . Arthritis   . Depression   . Diabetes mellitus without complication (HCC)   . GERD (gastroesophageal reflux disease)    pepto  . H/O hiatal hernia   . Pneumonia    4 years ago  . Post-nasal drip   . Prostate hyperplasia, benign localized, without urinary obstruction   . Seasonal allergies   . Sleep apnea    no CPAP    Assessment: 84 year old male known to pharmacy for antibiotic dosing now to start IV Heparin for ACS/chest pain. Pharmacy consulted to  dose.  Patient was not on anticoagulation prior to admission.   Heparin level therapeutic at 0.53, CBC low but stable and no bleeding reported at this time.  Goal of Therapy:  Heparin level 0.3-0.7 units/ml Monitor platelets by anticoagulation protocol: Yes   Plan:  Continue heparin gtt at 1050 units/hr Daily heparin level, CBC, s/s bleeding F/u LOT  Daylene PoseyJonathan Nekhi Liwanag, PharmD Clinical Pharmacist Please check AMION for all [facility] numbers 06/22/2018 8:38 AM

## 2018-06-22 NOTE — Evaluation (Addendum)
Physical Therapy Evaluation Patient Details Name: Matthew Calderon MRN: [id number] DOB: Aug 19, 1932 Today's Date: 06/22/2018   History of Present Illness  Patient is a 84 y/o male who presents with a cough and cold; s/p fall in bathroom. Admitted with NSTEMI. PMH includes DM, depression, anxiety.  Clinical Impression  Patient presents with generalized weakness, impaired balance, dizziness and impaired mobility s/p above. Tolerated transfers and gait training with Min A for balance/safety. [facility] and uses rollator for community ambulation. Reports 4 falls in last 2 months. [facility]. VSS throughout with soft BP and dizziness. Pt with 1 LOB getting into bathroom requiring assist to prevent fall. Would benefit from SNF to maximize independence and mobility and decrease fall risk prior to return home. Will follow acutely.   Supine BP 112/68 Sitting BP 108/74 Standing BP 116/77  Follow Up Recommendations SNF;Supervision for mobility/OOB    Equipment Recommendations  None recommended by PT    Recommendations for Other Services       Precautions / Restrictions Precautions Precautions: Fall Restrictions Weight Bearing Restrictions: No      Mobility  Bed Mobility Overal bed mobility: Needs Assistance Bed Mobility: Rolling;Sidelying to Sit Rolling: Min guard Sidelying to sit: Min guard;HOB elevated       General bed mobility comments: Use of rail to get to EOB, + dizziness.  Transfers Overall transfer level: Needs assistance Equipment used: Rolling walker (2 wheeled) Transfers: Sit to/from Stand Sit to Stand: Min assist         General transfer comment: Min A to power to standing. Stood [facility] [employer], from toilet x1. Use of momentum to stand. + dizziness.  Ambulation/Gait Ambulation/Gait assistance: Min guard;Min assist Gait Distance (Feet): 75 Feet Assistive device: Rolling walker (2 wheeled) Gait Pattern/deviations: Step-through  pattern;Decreased stride length;Trunk flexed Gait velocity: decreased   General Gait Details: Slow, mildly unsteady gait with use of RW for support; VSS throughout. Fatigues due to chronic back pain. LOB when walking into bathroom without DME needing Min A to maintain balance and rail.  Stairs            Wheelchair Mobility    Modified Rankin (Stroke Patients Only)       Balance Overall balance assessment: History of Falls;Needs assistance Sitting-balance support: Feet supported;No upper extremity supported Sitting balance-Leahy Scale: Fair Sitting balance - Comments: Min A to donn shoes/doff socks   Standing balance support: During functional activity Standing balance-Leahy Scale: Fair Standing balance comment: Requires UE support or external support for walking/standing.                             Pertinent Vitals/Pain Pain Assessment: No/denies pain    Home Living Family/patient expects to be discharged to:: Private residence Living Arrangements: Alone Available Help at Discharge: Available PRN/intermittently Type of Home: Independent living facility([facility]) Home Access: Level entry     Home Layout: One level Home Equipment: Walker - 4 wheels      Prior Function Level of Independence: Independent         Comments: Fell 3 times in last 4 months. [facility] [employer] with rollator, uses nothing in the apt. [facility] wife daily in [facility]. Used to like going swimming but no longer does that, naps a lot.     Hand Dominance   Dominant Hand: Right    Extremity/Trunk Assessment   Upper Extremity Assessment Upper  Extremity Assessment: Defer to OT evaluation    Lower Extremity Assessment Lower Extremity Assessment: Generalized weakness    Cervical / Trunk Assessment Cervical / Trunk Assessment: Kyphotic  Communication   Communication: No difficulties  Cognition Arousal/Alertness: Awake/alert Behavior During Therapy:  WFL for tasks assessed/performed Overall Cognitive Status: No family/caregiver present to determine baseline cognitive functioning                                 General Comments: Reports some memory issues and recently has had a decline in his health.       General Comments      Exercises     Assessment/Plan    PT Assessment Patient needs continued PT services  PT Problem List Decreased strength;Decreased mobility;Decreased balance;Cardiopulmonary status limiting activity;Decreased cognition       PT Treatment Interventions Balance training;Patient/family education;Gait training;Therapeutic activities;Therapeutic exercise;Functional mobility training    PT Goals (Current goals can be found in the Care Plan section)  Acute Rehab PT Goals Patient Stated Goal: to get moving PT Goal Formulation: With patient Time For Goal Achievement: 07/06/18 Potential to Achieve Goals: Good    Frequency Min 2X/week   Barriers to discharge Decreased caregiver support      Co-evaluation               AM-PAC PT "6 Clicks" Mobility  Outcome Measure Help needed turning from your back to your side while in a flat bed without using bedrails?: A Little Help needed moving from lying on your back to sitting on the side of a flat bed without using bedrails?: A Little Help needed moving to and from a bed to a chair (including a wheelchair)?: A Little Help needed standing up from a chair using your arms (e.g., wheelchair or bedside chair)?: A Little Help needed to walk in hospital room?: A Little Help needed climbing 3-5 steps with a railing? : A Lot 6 Click Score: 17    End of Session Equipment Utilized During Treatment: Gait belt Activity Tolerance: Patient tolerated treatment well Patient left: in bed;with call bell/phone within reach;with bed alarm set(sitting EOB to eat lunch) Nurse Communication: Mobility status PT Visit Diagnosis: Unsteadiness on feet  (R26.81);Difficulty in walking, not elsewhere classified (R26.2);Dizziness and giddiness (R42)    Time: [id number] PT Time Calculation (min) (ACUTE ONLY): 32 min   Charges:   PT Evaluation $PT Eval Low Complexity: 1 Low PT Treatments $Gait Training: 8-22 mins        Mylo RedShauna Mitch [place], [place], DPT Acute Rehabilitation Services Pager [phone] Office [phone]      Blake DivineShauna A Lanier Ensign[place]shorne 06/22/2018, 12:31 PM

## 2018-06-22 NOTE — Progress Notes (Signed)
   06/22/18 1707  Orthostatic Lying   BP- Lying (!) 126/95  Pulse- Lying 90  Orthostatic Sitting  BP- Sitting 113/74  Pulse- Sitting 107  Orthostatic Standing at 0 minutes  BP- Standing at 0 minutes 117/78  Pulse- Standing at 0 minutes 106  Orthostatic Standing at 3 minutes  BP- Standing at 3 minutes 129/79  Pulse- Standing at 3 minutes 110  Orthostatic Vitals Complete.  Pt tolerated well with no complaints.

## 2018-06-23 DIAGNOSIS — E1165 Type 2 diabetes mellitus with hyperglycemia: Secondary | ICD-10-CM

## 2018-06-23 LAB — HEPARIN LEVEL (UNFRACTIONATED): Heparin Unfractionated: 0.5 IU/mL (ref 0.30–0.70)

## 2018-06-23 LAB — GLUCOSE, CAPILLARY
GLUCOSE-CAPILLARY: 205 mg/dL — AB (ref 70–99)
GLUCOSE-CAPILLARY: 273 mg/dL — AB (ref 70–99)
Glucose-Capillary: 333 mg/dL — ABNORMAL HIGH (ref 70–99)
Glucose-Capillary: 379 mg/dL — ABNORMAL HIGH (ref 70–99)

## 2018-06-23 MED ORDER — METHYLPREDNISOLONE SODIUM SUCC 40 MG IJ SOLR
40.0000 mg | Freq: Two times a day (BID) | INTRAMUSCULAR | Status: DC
  Administered 2018-06-24 (×2): 40 mg via INTRAVENOUS
  Filled 2018-06-23 (×2): qty 1

## 2018-06-23 MED ORDER — POTASSIUM CHLORIDE CRYS ER 10 MEQ PO TBCR
10.0000 meq | EXTENDED_RELEASE_TABLET | Freq: Every day | ORAL | Status: DC
  Administered 2018-06-24 – 2018-06-26 (×3): 10 meq via ORAL
  Filled 2018-06-23 (×4): qty 1

## 2018-06-23 MED ORDER — CARVEDILOL 6.25 MG PO TABS
6.2500 mg | ORAL_TABLET | Freq: Two times a day (BID) | ORAL | Status: DC
  Administered 2018-06-23 – 2018-06-26 (×5): 6.25 mg via ORAL
  Filled 2018-06-23 (×6): qty 1

## 2018-06-23 MED ORDER — INSULIN GLARGINE 100 UNIT/ML ~~LOC~~ SOLN
15.0000 [IU] | Freq: Every day | SUBCUTANEOUS | Status: DC
  Administered 2018-06-23 – 2018-06-25 (×3): 15 [IU] via SUBCUTANEOUS
  Filled 2018-06-23 (×4): qty 0.15

## 2018-06-23 NOTE — Progress Notes (Signed)
[facility]  PROGRESS NOTE  Matthew Calderon YNW:[id number] DOB: October 15, 1932 DOA: 06/20/2018 PCP: Merri Brunette, MD   Brief HPI:   84 year old male with a history of obstructive sleep apnea not on therapy allergic rhinitis, BPH, GERD, type 2 diabetes mellitus, depression, arthritis, anemia who came to hospital after patient apparently passed out in the bathroom.  Patient says that he remembers falling down and describes symptoms more like near syncope.  In the ED blood work showed elevated troponin and patient was started on IV heparin for non-STEMI.  Cardiology was consulted.   Subjective   Patient seen and examined, denies chest pain or shortness of breath.  Still coughing up but having hard time coughing up phlegm.   Assessment/Plan:     1. Non-STEMI-patient admitted with elevated troponin, non-STEMI.  Heparin GTT started.  Cardiology  consulted.  Echocardiogram done yesterday showed severe hypokinesis of the mid apical anteroseptal and anterior myocardium, there is severe hypokinesis of the entire apical myocardium.  Family has opted for medical management only.   Troponin is trending downwards.    2. Acute bronchitis-improved, patient presented with symptoms of acute bronchitis, bilateral wheezing, IV antibiotics vancomycin and cefepime were started yesterday which have been discontinued.  Started on doxycycline, duo nebs, Mucinex.  Started on Solu-Medrol 60 mg IV every 6 hours, which has been changed to 40 mg every 6 hours.  Will change Solu-Medrol to 40 mg IV every 12 hours.  Will start flutter valve every 4 hours while awake.  3. Diabetes mellitus type 2-continue sliding scale insulin with NovoLog, CBG now elevated due to steroids.  Will increase Lantus to 15 units subcu daily.  4. Mild DKA-as per labs from this morning, bicarb is down to 15.  Anion gap of 15,  will avoid giving IV insulin as patient blood glucose is 205 at this time.  Dose of Solu-Medrol has been changed as above.   Lantus dose has been increased.  Will closely monitor patient's labs in a.m.  5. Near syncope-patient is describing symptoms of postural hypotension, will check orthostatic vital signs every shift.  6. BPH-continue Myrbetriq, Uroxatrol  7. Insomnia-continue trazodone  8. Hypokalemia-replete     CBG: Recent Labs  Lab 06/22/18 1629 06/22/18 2113 06/23/18 0752 06/23/18 1110 06/23/18 1623  GLUCAP 315* 397* 333* 379* 205*    CBC: Recent Labs  Lab 06/20/18 0600 06/21/18 0313 06/22/18 0354  WBC 12.1* 8.7 7.6  NEUTROABS 10.0*  --   --   HGB 12.0* 10.0* 10.1*  HCT 37.0* 32.0* 31.0*  MCV 82.2 81.8 81.6  PLT 171 137* 136*    Basic Metabolic Panel: Recent Labs  Lab 06/20/18 0600 06/21/18 0313 06/22/18 0354  NA 135 136 132*  K 3.5 3.3* 4.1  CL 100 105 102  CO2 20* 19* 15*  GLUCOSE 462* 248* 265*  BUN 22 18 18   CREATININE 1.26* 1.12 1.15  CALCIUM 8.5* 7.3* 7.1*     DVT prophylaxis: Heparin GTT  Code Status: Full code  Family Communication: No family at bedside  Disposition Plan: Skilled nursing facility once medically stable   Consultants:  Cardiology  Procedures:  None   Antibiotics:   Anti-infectives (From admission, onward)   Start     Dose/Rate Route Frequency Ordered Stop   06/21/18 2200  vancomycin (VANCOCIN) IVPB 750 mg/150 ml premix  Status:  Discontinued     750 mg 150 mL/hr over 60 Minutes Intravenous Every 36 hours 06/20/18 1012 06/20/18 1525   06/21/18 2200  doxycycline (VIBRA-TABS) tablet 100 mg     100 mg Oral Every 12 hours 06/21/18 1602     06/21/18 1000  ceFEPIme (MAXIPIME) 2 g in sodium chloride 0.9 % 100 mL IVPB  Status:  Discontinued     2 g 200 mL/hr over 30 Minutes Intravenous Every 24 hours 06/20/18 1012 06/20/18 1525   06/20/18 1015  ceFEPIme (MAXIPIME) 2 g in sodium chloride 0.9 % 100 mL IVPB     2 g 200 mL/hr over 30 Minutes Intravenous  Once 06/20/18 1000 06/20/18 1206   06/20/18 0900  vancomycin (VANCOCIN) 1,500 mg  in sodium chloride 0.9 % 500 mL IVPB  Status:  Discontinued     1,500 mg 250 mL/hr over 120 Minutes Intravenous  Once 06/20/18 0850 06/20/18 0851   06/20/18 0900  vancomycin (VANCOCIN) 1,500 mg in sodium chloride 0.9 % 500 mL IVPB     1,500 mg 250 mL/hr over 120 Minutes Intravenous  Once 06/20/18 0851 06/20/18 1205       Objective   Vitals:   06/22/18 2341 06/23/18 0446 06/23/18 0757 06/23/18 1517  BP:  117/76  99/60  Pulse: 88 92 100 98  Resp: (!) 21 19 (!) 21   Temp:  98 F (36.7 C)  98.5 F (36.9 C)  TempSrc:  Oral  Oral  SpO2: 95% 93% 98% 98%  Weight:  75.2 kg    Height:        Intake/Output Summary (Last 24 hours) at 06/23/2018 1711 Last data filed at 06/23/2018 1500 Gross per 24 hour  Intake 851 ml  Output 1600 ml  Net -749 ml   Filed Weights   06/21/18 0427 06/22/18 0500 06/23/18 0446  Weight: 74.3 kg 74.9 kg 75.2 kg     Physical Examination:   General: Appears in no acute distress Cardiovascular: S1-S2, regular, no murmur auscultated Respiratory: Bilateral rhonchi auscultated at lung bases left more than right Abdomen: Soft, nontender, no organomegaly Musculoskeletal: No edema in the lower extremities     Data Reviewed: I have personally reviewed following labs and imaging studies   Recent Results (from the past 240 hour(s))  Blood culture (routine x 2)     Status: None (Preliminary result)   Collection Time: 06/20/18  6:30 AM  Result Value Ref Range Status   Specimen Description BLOOD RIGHT ANTECUBITAL  Final   Special Requests   Final    BOTTLES DRAWN AEROBIC AND ANAEROBIC Blood Culture adequate volume   Culture   Final    NO GROWTH 3 DAYS Performed at [facility], [address] [place], [place] [address]    Report Status PENDING  Incomplete  Blood culture (routine x 2)     Status: None (Preliminary result)   Collection Time: 06/20/18  6:35 AM  Result Value Ref Range Status   Specimen Description BLOOD RIGHT HAND  Final    Special Requests   Final    BOTTLES DRAWN AEROBIC AND ANAEROBIC Blood Culture adequate volume   Culture   Final    NO GROWTH 3 DAYS Performed at [facility], [address] [place], [place] [address]    Report Status PENDING  Incomplete     Liver Function Tests: Recent Labs  Lab 06/20/18 0600  AST 33  ALT 17  ALKPHOS 66  BILITOT 0.6  PROT 6.8  ALBUMIN 3.7   No results for input(s): LIPASE, AMYLASE in the last 168 hours. No results for input(s): AMMONIA in the last 168  hours.  Cardiac Enzymes: Recent Labs  Lab 06/20/18 1531 06/20/18 2035 06/21/18 0313  TROPONINI 2.72* 2.76* 2.10*   BNP (last 3 results) No results for input(s): BNP in the last 8760 hours.  ProBNP (last 3 results) No results for input(s): PROBNP in the last 8760 hours.    Studies: No results found.  Scheduled Meds: . alfuzosin  10 mg Oral Q breakfast  . brimonidine  1 drop Both Eyes BID  . carvedilol  6.25 mg Oral BID WC  . doxycycline  100 mg Oral Q12H  . ferrous sulfate  325 mg Oral Daily  . guaiFENesin  600 mg Oral BID  . insulin aspart  0-15 Units Subcutaneous TID WC  . insulin aspart  0-5 Units Subcutaneous QHS  . insulin glargine  12 Units Subcutaneous QHS  . ipratropium-albuterol  3 mL Nebulization BID  . Melatonin  3 tablet Oral QHS  . methylPREDNISolone (SOLU-MEDROL) injection  40 mg Intravenous Q6H  . mirabegron ER  50 mg Oral Daily  . [START ON 06/24/2018] potassium chloride  10 mEq Oral Daily  . sacubitril-valsartan  1 tablet Oral BID  . sodium chloride flush  3 mL Intravenous Q12H  . timolol  1 drop Both Eyes BID  . traZODone  50 mg Oral QHS    Admission status: Inpatient: Based on patients clinical presentation and evaluation of above clinical data, I have made determination that patient meets Inpatient criteria at this time.  Patient will need more than 2 midnights stay in the hospital, he has non-STEMI, started on IV heparin.  Cardiology following.  Time spent: 25  min  Meredeth IdeGagan S Dorinne Graeff   [facility] Pager [phone]. If 7PM-7AM, please contact night-coverage at [email or web address], Office  [phone]  password [email or web address]  06/23/2018, 5:11 PM  LOS: 3 days

## 2018-06-23 NOTE — Progress Notes (Signed)
RT instructed pt on the use of flutter valve.  Pt able to demonstrate back good technique. 

## 2018-06-23 NOTE — Consult Note (Signed)
Ref: Merri BrunettePharr, Walter, MD   Subjective:  Afebrile but respiratory distress continues.   Objective:  Vital Signs in the last 24 hours: Temp:  [97.5 F (36.4 C)-98.2 F (36.8 C)] 98 F (36.7 C) (12/31 0446) Pulse Rate:  [88-109] 100 (12/31 0757) Cardiac Rhythm: Atrial fibrillation;Bundle branch block (12/31 0840) Resp:  [19-22] 21 (12/31 0757) BP: (109-117)/(68-76) 117/76 (12/31 0446) SpO2:  [93 %-98 %] 98 % (12/31 0757) Weight:  [75.2 kg] 75.2 kg (12/31 0446)  Physical Exam: BP Readings from Last 1 Encounters:  06/23/18 117/76     Wt Readings from Last 1 Encounters:  06/23/18 75.2 kg    Weight change: 0.272 kg Body mass index is 32.36 kg/m. HEENT: [place]/AT, Eyes-Blue, PERL, EOMI, Conjunctiva-Pale pink, Sclera-Non-icteric Neck: No JVD, No bruit, Trachea midline. Lungs:  Clearing, Bilateral. Cardiac:  Regular rhythm, normal S1 and S2, no S3. II/VI systolic murmur. Abdomen:  Soft, non-tender. BS present. Extremities:  No edema present. No cyanosis. No clubbing. CNS: AxOx3, Cranial nerves grossly intact, moves all 4 extremities.  Skin: Warm and dry.   Intake/Output from previous day: 12/30 0701 - 12/31 0700 In: 851 [P.O.:720; I.V.:131] Out: 1250 [Urine:1250]    Lab Results: BMET    Component Value Date/Time   NA 132 (L) 06/22/2018 0354   NA 136 06/21/2018 0313   NA 135 06/20/2018 0600   K 4.1 06/22/2018 0354   K 3.3 (L) 06/21/2018 0313   K 3.5 06/20/2018 0600   CL 102 06/22/2018 0354   CL 105 06/21/2018 0313   CL 100 06/20/2018 0600   CO2 15 (L) 06/22/2018 0354   CO2 19 (L) 06/21/2018 0313   CO2 20 (L) 06/20/2018 0600   GLUCOSE 265 (H) 06/22/2018 0354   GLUCOSE 248 (H) 06/21/2018 0313   GLUCOSE 462 (H) 06/20/2018 0600   BUN 18 06/22/2018 0354   BUN 18 06/21/2018 0313   BUN 22 06/20/2018 0600   CREATININE 1.15 06/22/2018 0354   CREATININE 1.12 06/21/2018 0313   CREATININE 1.26 (H) 06/20/2018 0600   CALCIUM 7.1 (L) 06/22/2018 0354   CALCIUM 7.3 (L) 06/21/2018  0313   CALCIUM 8.5 (L) 06/20/2018 0600   GFRNONAA 59 (L) 06/22/2018 0354   GFRNONAA >60 06/21/2018 0313   GFRNONAA 52 (L) 06/20/2018 0600   GFRAA >60 06/22/2018 0354   GFRAA >60 06/21/2018 0313   GFRAA >60 06/20/2018 0600   CBC    Component Value Date/Time   WBC 7.6 06/22/2018 0354   RBC 3.80 (L) 06/22/2018 0354   HGB 10.1 (L) 06/22/2018 0354   HCT 31.0 (L) 06/22/2018 0354   PLT 136 (L) 06/22/2018 0354   MCV 81.6 06/22/2018 0354   MCH 26.6 06/22/2018 0354   MCHC 32.6 06/22/2018 0354   RDW 17.4 (H) 06/22/2018 0354   LYMPHSABS 0.6 (L) 06/20/2018 0600   MONOABS 1.4 (H) 06/20/2018 0600   EOSABS 0.0 06/20/2018 0600   BASOSABS 0.0 06/20/2018 0600   HEPATIC Function Panel Recent Labs    06/20/18 0600  PROT 6.8   HEMOGLOBIN A1C No components found for: HGA1C,  MPG CARDIAC ENZYMES Lab Results  Component Value Date   TROPONINI 2.10 (HH) 06/21/2018   TROPONINI 2.76 (HH) 06/20/2018   TROPONINI 2.72 (HH) 06/20/2018   BNP No results for input(s): PROBNP in the last 8760 hours. TSH No results for input(s): TSH in the last 8760 hours. CHOLESTEROL No results for input(s): CHOL in the last 8760 hours.  Scheduled Meds: . alfuzosin  10 mg Oral Q breakfast  .  brimonidine  1 drop Both Eyes BID  . doxycycline  100 mg Oral Q12H  . ferrous sulfate  325 mg Oral Daily  . guaiFENesin  600 mg Oral BID  . insulin aspart  0-15 Units Subcutaneous TID WC  . insulin aspart  0-5 Units Subcutaneous QHS  . insulin glargine  12 Units Subcutaneous QHS  . ipratropium-albuterol  3 mL Nebulization BID  . Melatonin  3 tablet Oral QHS  . methylPREDNISolone (SOLU-MEDROL) injection  40 mg Intravenous Q6H  . metoprolol tartrate  50 mg Oral BID  . mirabegron ER  50 mg Oral Daily  . [START ON 06/24/2018] potassium chloride  10 mEq Oral Daily  . sacubitril-valsartan  1 tablet Oral BID  . sodium chloride flush  3 mL Intravenous Q12H  . timolol  1 drop Both Eyes BID  . traZODone  50 mg Oral QHS    Continuous Infusions: . heparin 1,050 Units/hr (06/23/18 1139)   PRN Meds:.acetaminophen, benzonatate, chlorpheniramine-HYDROcodone, ondansetron **OR** ondansetron (ZOFRAN) IV, oxyCODONE  Assessment/Plan: NSTEMI Near syncope Ischemic cardiomyopathy CAD Acute bronchitis Uncontrolled DM type 2 H/O renal stone  Continue medical treatment.    LOS: 3 days    Orpah CobbAjay Anvith Mauriello  MD  06/23/2018, 2:38 PM

## 2018-06-23 NOTE — Progress Notes (Signed)
Inpatient Diabetes Program Recommendations  AACE/ADA: New Consensus Statement on Inpatient Glycemic Control (2015)  Target Ranges:  Prepandial:   less than 140 mg/dL      Peak postprandial:   less than 180 mg/dL (1-2 hours)      Critically ill patients:  140 - 180 mg/dL   Results for Matthew Calderon, Matthew Calderon (MRN [id number]) as of 06/23/2018 10:21  Ref. Range 06/22/2018 07:44 06/22/2018 11:24 06/22/2018 16:29 06/22/2018 21:13  Glucose-Capillary Latest Ref Range: 70 - 99 mg/dL 784247 (H)  5 units NOVOLOG  313 (H)  11 units NOVOLOG  315 (H)  11 units NOVOLOG  397 (H)  5 units NOVOLOG +  12 units LANTUS    Results for Matthew Calderon, Matthew Calderon (MRN [id number]) as of 06/23/2018 10:21  Ref. Range 06/23/2018 07:52  Glucose-Capillary Latest Ref Range: 70 - 99 mg/dL 132333 (H)  11 units NOVOLOG     Home DM Meds: Metformin 1500 mg Daily  Current Orders: Lantus 12 units QHS      Novolog Moderate Correction Scale/ SSI (0-15 units) TID AC + HS       Getting Solumedrol 40 mg Q6 hours.  Lantus started last PM.  CBG still quite elevated this AM: 333 mg/dl.    MD- Please consider the following in-hospital insulin adjustments:  1. Increase Lantus to 16 units QHS (30% increase)  2. Start Novolog Meal Coverage: Novolog 4 units TID with meals  (Please add the following Hold Parameters: Hold if pt eats <50% of meal, Hold if pt NPO)    --Will follow patient during hospitalization--  Ambrose FinlandJeannine Johnston Benn Tarver RN, MSN, CDE Diabetes Coordinator Inpatient Glycemic Control Team Team Pager: [phone] (8a-5p)

## 2018-06-23 NOTE — [place] FL2 (Signed)
[place] MEDICAID FL2 LEVEL OF CARE SCREENING TOOL     IDENTIFICATION  Patient Name: Matthew Calderon Birthdate: 11/13/1932 Sex: male Admission Date (Current Location): 06/20/2018  [facility] [place] [place]   Facility and Address:  [facility], [address], [place], [place] [address]      Provider Number: [id number]  Attending Physician Name and Address:  Meredeth IdeLama, Gagan S, MD  Relative Name and Phone Number:  Armstead PeaksSally Brown [phone]    Current Level of Care: Hospital Recommended Level of Care: Skilled Nursing Facility Prior Approval Number:    Date Approved/Denied:   PASRR Number: [id number]  Discharge Plan: SNF    Current Diagnoses: Patient Active Problem List   Diagnosis Date Noted  . NSTEMI (non-ST elevated myocardial infarction) (HCC) 06/20/2018  . Prostate hyperplasia, benign localized, without urinary obstruction   . Hyperglycemia   . Syncope, cardiogenic   . Degenerative joint disease (DJD) of hip 01/13/2014  . Diabetes (HCC) 01/13/2014    Orientation RESPIRATION BLADDER Height & Weight     Self, Time, Situation, Place  Normal Continent Weight: 75.2 kg Height:  5' (152.4 cm)  BEHAVIORAL SYMPTOMS/MOOD NEUROLOGICAL BOWEL NUTRITION STATUS      Continent Diet(please see DC summary)  AMBULATORY STATUS COMMUNICATION OF NEEDS Skin   Limited Assist Verbally Normal                       Personal Care Assistance Level of Assistance  Bathing, Feeding, Dressing Bathing Assistance: Limited assistance Feeding assistance: Independent Dressing Assistance: Limited assistance     Functional Limitations Info  Sight, Hearing, Speech Sight Info: Impaired Hearing Info: Impaired Speech Info: Adequate    SPECIAL CARE FACTORS FREQUENCY  PT (By licensed PT)     PT Frequency: 5x/week              Contractures Contractures Info: Not present    Additional Factors Info  Code Status, Allergies, Insulin Sliding Scale,  Psychotropic Code Status Info: Full Allergies Info: Penicillins, Sulfa Antibiotics Psychotropic Info: trazadone Insulin Sliding Scale Info: novolog 3x/day with meals and at bedtime, lantus at bedtime       Current Medications (06/23/2018):  This is the current hospital active medication list Current Facility-Administered Medications  Medication Dose Route Frequency Provider Last Rate Last Dose  . acetaminophen (TYLENOL) tablet 650 mg  650 mg Oral Q6H PRN Meredeth IdeLama, Gagan S, MD   650 mg at 06/21/18 1932  . alfuzosin (UROXATRAL) 24 hr tablet 10 mg  10 mg Oral Q breakfast Debe CoderMullen, Emily B, MD   10 mg at 06/23/18 0809  . benzonatate (TESSALON) capsule 200 mg  200 mg Oral BID PRN Audrea MuscatBlount, Xenia T, NP   200 mg at 06/23/18 0810  . brimonidine (ALPHAGAN) 0.2 % ophthalmic solution 1 drop  1 drop Both Eyes BID Inez CatalinaMullen, Emily B, MD   1 drop at 06/23/18 [phone]  . chlorpheniramine-HYDROcodone (TUSSIONEX) 10-8 MG/5ML suspension 5 mL  5 mL Oral Q12H PRN Inez CatalinaMullen, Emily B, MD   5 mL at 06/21/18 1805  . doxycycline (VIBRA-TABS) tablet 100 mg  100 mg Oral Q12H Meredeth IdeLama, Gagan S, MD   100 mg at 06/23/18 0809  . ferrous sulfate tablet 325 mg  325 mg Oral Daily Inez CatalinaMullen, Emily B, MD   325 mg at 06/23/18 0809  . guaiFENesin (MUCINEX) 12 hr tablet 600 mg  600 mg Oral BID Meredeth IdeLama, Gagan S, MD   600 mg at 06/23/18 [id number]  .  heparin ADULT infusion 100 units/mL (25000 units/22250mL sodium chloride 0.45%)  1,050 Units/hr Intravenous Continuous Meredeth IdeLama, Gagan S, MD 10.5 mL/hr at 06/22/18 0814 1,050 Units/hr at 06/22/18 0814  . insulin aspart (novoLOG) injection 0-15 Units  0-15 Units Subcutaneous TID WC Inez CatalinaMullen, Emily B, MD   11 Units at 06/23/18 [phone]  . insulin aspart (novoLOG) injection 0-5 Units  0-5 Units Subcutaneous QHS Inez CatalinaMullen, Emily B, MD   5 Units at 06/22/18 2116  . insulin glargine (LANTUS) injection 12 Units  12 Units Subcutaneous QHS Meredeth IdeLama, Gagan S, MD   12 Units at 06/22/18 2225  . ipratropium-albuterol (DUONEB) 0.5-2.5 (3) MG/3ML  nebulizer solution 3 mL  3 mL Nebulization BID Meredeth IdeLama, Gagan S, MD   3 mL at 06/23/18 0757  . Melatonin TABS 9 mg  3 tablet Oral QHS Debe CoderMullen, Emily B, MD   9 mg at 06/22/18 2102  . methylPREDNISolone sodium succinate (SOLU-MEDROL) 40 mg/mL injection 40 mg  40 mg Intravenous Q6H Meredeth IdeLama, Gagan S, MD   40 mg at 06/23/18 [id number]  . metoprolol tartrate (LOPRESSOR) tablet 50 mg  50 mg Oral BID Orpah CobbKadakia, Ajay, MD   50 mg at 06/23/18 0810  . mirabegron ER (MYRBETRIQ) tablet 50 mg  50 mg Oral Daily Inez CatalinaMullen, Emily B, MD   50 mg at 06/23/18 0810  . ondansetron (ZOFRAN) tablet 4 mg  4 mg Oral Q6H PRN Debe CoderMullen, Emily B, MD   4 mg at 06/20/18 1629   Or  . ondansetron (ZOFRAN) injection 4 mg  4 mg Intravenous Q6H PRN Debe CoderMullen, Emily B, MD      . oxyCODONE (Oxy IR/ROXICODONE) immediate release tablet 5 mg  5 mg Oral Q4H PRN Inez CatalinaMullen, Emily B, MD      . Melene Muller[START ON 06/24/2018] potassium chloride (K-DUR) CR tablet 10 mEq  10 mEq Oral Daily Orpah CobbKadakia, Ajay, MD      . sacubitril-valsartan (ENTRESTO) 24-26 mg per tablet  1 tablet Oral BID Orpah CobbKadakia, Ajay, MD   1 tablet at 06/23/18 0809  . sodium chloride flush (NS) 0.9 % injection 3 mL  3 mL Intravenous Q12H Debe CoderMullen, Emily B, MD   3 mL at 06/22/18 2121  . timolol (TIMOPTIC) 0.5 % ophthalmic solution 1 drop  1 drop Both Eyes BID Inez CatalinaMullen, Emily B, MD   1 drop at 06/23/18 [phone]  . traZODone (DESYREL) tablet 50 mg  50 mg Oral QHS Inez CatalinaMullen, Emily B, MD   50 mg at 06/22/18 2102     Discharge Medications: Please see discharge summary for a list of discharge medications.  Relevant Imaging Results:  Relevant Lab Results:   Additional Information SSN: [id number]  Abigail ButtsSusan Modesto Ganoe, LCSW

## 2018-06-23 NOTE — Care Management (Signed)
#    1.   S/W   [facility] # [id number]   ENTRESTO  24-26 MG BID COVER- YES CO-PAY- $ 38.70 TIER- 2 DRUG PRIOR APPROVAL- NO  PREFERRED PHARMACY : YES [facility] AND [facility] M/O 90 DAY SUPPLY FOR M/O $ 80.00

## 2018-06-23 NOTE — Care Management Important Message (Signed)
Important Message  Patient Details  Name: Matthew Calderon MRN: [id number] Date of Birth: January 05, 1933   Medicare Important Message Given:  Yes    Ralston Venus P Alireza Pollack 06/23/2018, 4:17 PM

## 2018-06-23 NOTE — Care Management Note (Signed)
Case Management Note  Patient Details  Name: Matthew Calderon MRN: [id number] Date of Birth: 02-Jun-1933  Subjective/Objective:  Pt presented for Nstemi- [facility]. Plan to go to SNF at facility once insurance authorizes.                  Action/Plan: Benefits check for Entresto completed. CSW did make SNF aware of medication and cost. Entresto 30 day free card to be sent to facility with the patient. No further needs from CM at this time.   Expected Discharge Date:                  Expected Discharge Plan:  Skilled Nursing Facility  In-House Referral:  Clinical Social Work  Discharge planning Services  CM Consult  Post Acute Care Choice:  NA Choice offered to:  NA  DME Arranged:  N/A DME Agency:  NA  HH Arranged:  NA HH Agency:  NA  Status of Service:  Completed, signed off  If discussed at Long Length of Stay Meetings, dates discussed:    Additional Comments:  Gala LewandowskyGraves-Bigelow, Faithlynn Deeley Kaye, RN 06/23/2018, 12:58 PM

## 2018-06-23 NOTE — Progress Notes (Signed)
ANTICOAGULATION CONSULT NOTE   Pharmacy Consult for Heparin Indication: chest pain/ACS  Allergies  Allergen Reactions  . Penicillins Hives    DID THE REACTION INVOLVE: Swelling of the face/tongue/throat, SOB, or low BP? No Sudden or severe rash/hives, skin peeling, or the inside of the mouth or nose? No Did it require medical treatment? No When did it last happen?childhood allergy If all above answers are "NO", may proceed with cephalosporin use.   . Sulfa Antibiotics Hives    Patient Measurements: Height: 5' (152.4 cm) Weight: 165 lb 11.2 oz (75.2 kg) IBW/kg (Calculated) : 50 Heparin Dosing Weight: 66.2 kg  Vital Signs: Temp: 98 F (36.7 C) (12/31 0446) Temp Source: Oral (12/31 0446) BP: 117/76 (12/31 0446) Pulse Rate: 100 (12/31 0757)  Labs: Recent Labs    06/20/18 1531  06/20/18 2035 06/21/18 0313  06/21/18 1957 06/22/18 0354 06/23/18 0354  HGB  --   --   --  10.0*  --   --  10.1*  --   HCT  --   --   --  32.0*  --   --  31.0*  --   PLT  --   --   --  137*  --   --  136*  --   HEPARINUNFRC  --    < > 0.38 0.24*   < > 0.32 0.53 0.50  CREATININE  --   --   --  1.12  --   --  1.15  --   TROPONINI 2.72*  --  2.76* 2.10*  --   --   --   --    < > = values in this interval not displayed.    Estimated Creatinine Clearance: 41.4 mL/min (by C-G formula based on SCr of 1.15 mg/dL).   Medical History: Past Medical History:  Diagnosis Date  . Anemia    takes iron pill  . Anxiety   . Arthritis   . Depression   . Diabetes mellitus without complication (HCC)   . GERD (gastroesophageal reflux disease)    pepto  . H/O hiatal hernia   . Pneumonia    4 years ago  . Post-nasal drip   . Prostate hyperplasia, benign localized, without urinary obstruction   . Seasonal allergies   . Sleep apnea    no CPAP    Assessment: 84 year old male known to pharmacy for antibiotic dosing now to start IV Heparin for ACS/chest pain. Pharmacy consulted to dose.  Patient  was not on anticoagulation prior to admission.   Heparin level remains therapeutic at 0.50, H/H low but stable, plts 136.    Goal of Therapy:  Heparin level 0.3-0.7 units/ml Monitor platelets by anticoagulation protocol: Yes   Plan:  Continue heparin gtt at 1050 units/hr Daily heparin level, CBC, s/s bleeding F/u LOT  Daylene PoseyJonathan Tarrin Menn, PharmD Clinical Pharmacist Please check AMION for all [facility] numbers 06/23/2018 9:07 AM

## 2018-06-23 NOTE — Clinical Social Work Note (Signed)
Clinical Social Work Assessment  Patient Details  Name: Matthew Calderon MRN: [id number] Date of Birth: April 11, 1933  Date of referral:  06/23/18               Reason for consult:  Facility Placement, Discharge Planning                Permission sought to share information with:  Facility Sport and exercise psychologist, Family Supports Permission granted to share information::  Yes, Verbal Permission Granted  Name::        Agency::  [facility]  Relationship::  daughter  Contact Information:     Housing/Transportation Living arrangements for the past 2 months:  [place] of Information:  Patient Patient Interpreter Needed:  None Criminal Activity/Legal Involvement Pertinent to Current Situation/Hospitalization:  No - Comment as needed Significant Relationships:  Adult Children, Spouse Lives with:  Facility Resident Do you feel safe going back to the place where you live?  Yes Need for family participation in patient care:  No (Coment)  Care giving concerns: Patient from [place]. PT recommending SNF.    Social Worker assessment / plan: CSW met with patient at bedside. Patient alert and oriented. CSW introduced self and role and discussed disposition planning - PT recommendation for SNF.  Patient is agreeable to rehab at [facility] and prefers [facility] SNF. His wife lives at [place]. CSW called [facility] admissions and they will have a bed available for patient on Thurs (06/25/18). Facility will start patient's [facility] authorization. [employer] approval is needed before patient can be admitted to the facility; anticipate possible delay in auth due to the holiday.  CSW to follow and support with discharge planning.  Employment status:  Retired Research officer, political party) PT Recommendations:  [place] / Referral to community resources:  [place]  Patient/Family's Response to care: Patient appreciative of  care.  Patient/Family's Understanding of and Emotional Response to Diagnosis, Current Treatment, and Prognosis: Patient with understanding of his conditions and care needs. Agreeable to SNF.  Emotional Assessment Appearance:  Appears stated age Attitude/Demeanor/Rapport:  Engaged Affect (typically observed):  Accepting, Calm, Appropriate, Pleasant Orientation:  Oriented to Self, Oriented to Place, Oriented to  Time, Oriented to Situation Alcohol / Substance use:  Not Applicable Psych involvement (Current and /or in the community):  No (Comment)  Discharge Needs  Concerns to be addressed:  Discharge Planning Concerns, Care Coordination Readmission within the last 30 days:  No Current discharge risk:  Physical Impairment Barriers to Discharge:  Continued Medical Work up, [place], LCSW 06/23/2018, 10:48 AM

## 2018-06-24 LAB — BASIC METABOLIC PANEL
Anion gap: 10 (ref 5–15)
BUN: 30 mg/dL — ABNORMAL HIGH (ref 8–23)
CO2: 20 mmol/L — ABNORMAL LOW (ref 22–32)
Calcium: 7.3 mg/dL — ABNORMAL LOW (ref 8.9–10.3)
Chloride: 106 mmol/L (ref 98–111)
Creatinine, Ser: 1.01 mg/dL (ref 0.61–1.24)
GFR calc non Af Amer: >60 mL/min (ref >60)
Glucose, Bld: 289 mg/dL — ABNORMAL HIGH (ref 70–99)
Potassium: 4.4 mmol/L (ref 3.5–5.1)
Sodium: 136 mmol/L (ref 135–145)

## 2018-06-24 LAB — GLUCOSE, CAPILLARY
GLUCOSE-CAPILLARY: 378 mg/dL — AB (ref 70–99)
Glucose-Capillary: 304 mg/dL — ABNORMAL HIGH (ref 70–99)
Glucose-Capillary: 215 mg/dL — ABNORMAL HIGH (ref 70–99)
Glucose-Capillary: 222 mg/dL — ABNORMAL HIGH (ref 70–99)

## 2018-06-24 LAB — HEPARIN LEVEL (UNFRACTIONATED): Heparin Unfractionated: 0.43 IU/mL (ref 0.30–0.70)

## 2018-06-24 MED ORDER — ASPIRIN EC 81 MG PO TBEC
81.0000 mg | DELAYED_RELEASE_TABLET | Freq: Every day | ORAL | Status: DC
  Administered 2018-06-25 – 2018-06-26 (×2): 81 mg via ORAL
  Filled 2018-06-24 (×2): qty 1

## 2018-06-24 MED ORDER — INSULIN GLARGINE 100 UNIT/ML ~~LOC~~ SOLN
5.0000 [IU] | Freq: Once | SUBCUTANEOUS | Status: DC
  Filled 2018-06-24: qty 0.05

## 2018-06-24 MED ORDER — INSULIN GLARGINE 100 UNIT/ML ~~LOC~~ SOLN
5.0000 [IU] | Freq: Every day | SUBCUTANEOUS | Status: DC
  Administered 2018-06-24: 5 [IU] via SUBCUTANEOUS
  Filled 2018-06-24: qty 0.05

## 2018-06-24 MED ORDER — PANTOPRAZOLE SODIUM 40 MG PO TBEC
40.0000 mg | DELAYED_RELEASE_TABLET | Freq: Every day | ORAL | Status: DC
  Administered 2018-06-25 – 2018-06-26 (×2): 40 mg via ORAL
  Filled 2018-06-24 (×2): qty 1

## 2018-06-24 MED ORDER — INSULIN GLARGINE 100 UNIT/ML ~~LOC~~ SOLN
10.0000 [IU] | Freq: Every day | SUBCUTANEOUS | Status: DC
  Administered 2018-06-25 – 2018-06-26 (×2): 10 [IU] via SUBCUTANEOUS
  Filled 2018-06-24 (×2): qty 0.1

## 2018-06-24 MED ORDER — SODIUM CHLORIDE 0.9 % IV SOLN
2.0000 g | Freq: Three times a day (TID) | INTRAVENOUS | Status: DC
  Filled 2018-06-24: qty 2

## 2018-06-24 MED ORDER — PREDNISONE 20 MG PO TABS
30.0000 mg | ORAL_TABLET | Freq: Every day | ORAL | Status: DC
  Administered 2018-06-25: 30 mg via ORAL
  Filled 2018-06-24: qty 1

## 2018-06-24 MED ORDER — FUROSEMIDE 40 MG PO TABS
40.0000 mg | ORAL_TABLET | Freq: Every day | ORAL | Status: DC
  Administered 2018-06-24 – 2018-06-26 (×3): 40 mg via ORAL
  Filled 2018-06-24 (×3): qty 1

## 2018-06-24 NOTE — Progress Notes (Signed)
ANTICOAGULATION CONSULT NOTE   Pharmacy Consult for Heparin Indication: chest pain/ACS  Allergies  Allergen Reactions  . Penicillins Hives    DID THE REACTION INVOLVE: Swelling of the face/tongue/throat, SOB, or low BP? No Sudden or severe rash/hives, skin peeling, or the inside of the mouth or nose? No Did it require medical treatment? No When did it last happen?childhood allergy If all above answers are "NO", may proceed with cephalosporin use.   . Sulfa Antibiotics Hives    Patient Measurements: Height: 5' (152.4 cm) Weight: 164 lb 1.6 oz (74.4 kg) IBW/kg (Calculated) : 50 Heparin Dosing Weight: 66.2 kg  Vital Signs: Temp: 98.4 F (36.9 C) (01/01 0758) Temp Source: Oral (01/01 0758) BP: 120/72 (01/01 0758) Pulse Rate: 87 (01/01 0758)  Labs: Recent Labs    06/22/18 0354 06/23/18 0354 06/24/18 0434  HGB 10.1*  --   --   HCT 31.0*  --   --   PLT 136*  --   --   HEPARINUNFRC 0.53 0.50 0.43  CREATININE 1.15  --  1.01    Estimated Creatinine Clearance: 46.9 mL/min (by C-G formula based on SCr of 1.01 mg/dL).   Medical History: Past Medical History:  Diagnosis Date  . Anemia    takes iron pill  . Anxiety   . Arthritis   . Depression   . Diabetes mellitus without complication (HCC)   . GERD (gastroesophageal reflux disease)    pepto  . H/O hiatal hernia   . Pneumonia    4 years ago  . Post-nasal drip   . Prostate hyperplasia, benign localized, without urinary obstruction   . Seasonal allergies   . Sleep apnea    no CPAP    Assessment: 85 year old male known to pharmacy for antibiotic dosing now to start IV Heparin for ACS/chest pain. Pharmacy consulted to dose.  Patient was not on anticoagulation prior to admission.   Heparin level remains therapeutic at 0.43,no cbc this am, will ensure ordered for tomorrow. No bleeding issues noted.   Goal of Therapy:  Heparin level 0.3-0.7 units/ml Monitor platelets by anticoagulation protocol: Yes    Plan:  Continue heparin gtt at 1050 units/hr Daily heparin level, CBC, s/s bleeding F/u LOT  Sheppard CoilFrank Melanie Openshaw PharmD., BCPS Clinical Pharmacist 06/24/2018 10:11 AM

## 2018-06-24 NOTE — Progress Notes (Signed)
[facility]  PROGRESS NOTE  Matthew CairoDwight B Burrous ZOX:[id number] DOB: 01-30-1933 DOA: 06/20/2018 PCP: Merri BrunettePharr, Walter, MD   Brief HPI:    85 year old male with a history of obstructive sleep apnea not on therapy allergic rhinitis, BPH, GERD, type 2 diabetes mellitus, depression, arthritis, anemia who came to hospital after patient apparently passed out in the bathroom.  Patient says that he remembers falling down and describes symptoms more like near syncope.  In the ED blood work showed elevated troponin and patient was started on IV heparin for non-STEMI.  Cardiology was consulted.   Subjective   Patient seen and examined, denies chest pain or shortness of breath.  Still coughing up but having hard time coughing up phlegm.   Assessment/Plan:    Non-STEMI -patient admitted with elevated troponin, non-STEMI.  Heparin GTT started.  Cardiology  consulted.  -Echo showing severe hypokinesis, with drop in EF to 25% -Discussed with cardiology, he discussed with family, they opted for medical management only. -He is on heparin GTT, discussed with cardiology, will be able to stop today, started on aspirin. -Protonix for GI prophylaxis.  Acute systolic CHF with ischemic cardiomyopathy -Beta-blockers, Entresto -Regiment per cardiology   Acute bronchitis-improved - patient presented with symptoms of acute bronchitis, bilateral wheezing, but acute on IV antibiotics vancomycin and cefepime on admission, have been stopped . -On IV steroids, will transition to oral prednisone tomorrow .  Diabetes mellitus type 2 -continue sliding scale insulin with NovoLog, CBG remains elevated, hopefully once steroids are tapered will improve, I will add 5 units of Lantus daily top of the 15 units nightly.   Near syncope-patient is describing symptoms of postural hypotension, will check orthostatic vital signs every shift.   BPH-continue Myrbetriq, Uroxatrol   Insomnia-continue  trazodone   Hypokalemia-replete     CBG: Recent Labs  Lab 06/23/18 1110 06/23/18 1623 06/23/18 2054 06/24/18 0757 06/24/18 1116  GLUCAP 379* 205* 273* 304* 378*    CBC: Recent Labs  Lab 06/20/18 0600 06/21/18 0313 06/22/18 0354  WBC 12.1* 8.7 7.6  NEUTROABS 10.0*  --   --   HGB 12.0* 10.0* 10.1*  HCT 37.0* 32.0* 31.0*  MCV 82.2 81.8 81.6  PLT 171 137* 136*    Basic Metabolic Panel: Recent Labs  Lab 06/20/18 0600 06/21/18 0313 06/22/18 0354 06/24/18 0434  NA 135 136 132* 136  K 3.5 3.3* 4.1 4.4  CL 100 105 102 106  CO2 20* 19* 15* 20*  GLUCOSE 462* 248* 265* 289*  BUN 22 18 18  30*  CREATININE 1.26* 1.12 1.15 1.01  CALCIUM 8.5* 7.3* 7.1* 7.3*     DVT prophylaxis: Heparin GTT(stopped today)  Code Status: Full code  Family Communication: No family at bedside  Disposition Plan: Skilled nursing facility once medically stable   Consultants:  Cardiology  Procedures:  None   Antibiotics:   Anti-infectives (From admission, onward)   Start     Dose/Rate Route Frequency Ordered Stop   06/21/18 2200  vancomycin (VANCOCIN) IVPB 750 mg/150 ml premix  Status:  Discontinued     750 mg 150 mL/hr over 60 Minutes Intravenous Every 36 hours 06/20/18 1012 06/20/18 1525   06/21/18 2200  doxycycline (VIBRA-TABS) tablet 100 mg     100 mg Oral Every 12 hours 06/21/18 1602     06/21/18 1000  ceFEPIme (MAXIPIME) 2 g in sodium chloride 0.9 % 100 mL IVPB  Status:  Discontinued     2 g 200 mL/hr over 30 Minutes Intravenous Every 24 hours  06/20/18 1012 06/20/18 1525   06/20/18 1015  ceFEPIme (MAXIPIME) 2 g in sodium chloride 0.9 % 100 mL IVPB     2 g 200 mL/hr over 30 Minutes Intravenous  Once 06/20/18 1000 06/20/18 1206   06/20/18 0900  vancomycin (VANCOCIN) 1,500 mg in sodium chloride 0.9 % 500 mL IVPB  Status:  Discontinued     1,500 mg 250 mL/hr over 120 Minutes Intravenous  Once 06/20/18 0850 06/20/18 0851   06/20/18 0900  vancomycin (VANCOCIN) 1,500 mg in  sodium chloride 0.9 % 500 mL IVPB     1,500 mg 250 mL/hr over 120 Minutes Intravenous  Once 06/20/18 0851 06/20/18 1205       Objective   Vitals:   06/23/18 2004 06/24/18 0444 06/24/18 0758 06/24/18 1318  BP: 107/73 117/73 120/72 125/69  Pulse: 86 82 87   Resp: 16 18 15  (!) 29  Temp: 98.1 F (36.7 C) 98.3 F (36.8 C) 98.4 F (36.9 C)   TempSrc: Oral Oral Oral   SpO2: 99% 99% 96%   Weight:  74.4 kg    Height:        Intake/Output Summary (Last 24 hours) at 06/24/2018 1356 Last data filed at 06/24/2018 0600 Gross per 24 hour  Intake 747.96 ml  Output 1075 ml  Net -327.04 ml   Filed Weights   06/22/18 0500 06/23/18 0446 06/24/18 0444  Weight: 74.9 kg 75.2 kg 74.4 kg     Physical Examination:  Awake Alert, Oriented X 3, No new F.N deficits, Normal affect Symmetrical Chest wall movement, Minister entry at the bases with some rhonchi RRR,No Gallops,Rubs or new Murmurs, No Parasternal Heave +ve B.Sounds, Abd Soft, No tenderness, No rebound - guarding or rigidity. No Cyanosis, Clubbing or edema, No new Rash or bruise      Data Reviewed: I have personally reviewed following labs and imaging studies   Recent Results (from the past 240 hour(s))  Blood culture (routine x 2)     Status: None (Preliminary result)   Collection Time: 06/20/18  6:30 AM  Result Value Ref Range Status   Specimen Description BLOOD RIGHT ANTECUBITAL  Final   Special Requests   Final    BOTTLES DRAWN AEROBIC AND ANAEROBIC Blood Culture adequate volume   Culture   Final    NO GROWTH 3 DAYS Performed at [facility], [address], [place], [place] [address]    Report Status PENDING  Incomplete  Blood culture (routine x 2)     Status: None (Preliminary result)   Collection Time: 06/20/18  6:35 AM  Result Value Ref Range Status   Specimen Description BLOOD RIGHT HAND  Final   Special Requests   Final    BOTTLES DRAWN AEROBIC AND ANAEROBIC Blood Culture adequate volume   Culture   Final     NO GROWTH 3 DAYS Performed at [facility], [address] [place], [place] [address]    Report Status PENDING  Incomplete     Liver Function Tests: Recent Labs  Lab 06/20/18 0600  AST 33  ALT 17  ALKPHOS 66  BILITOT 0.6  PROT 6.8  ALBUMIN 3.7   No results for input(s): LIPASE, AMYLASE in the last 168 hours. No results for input(s): AMMONIA in the last 168 hours.  Cardiac Enzymes: Recent Labs  Lab 06/20/18 1531 06/20/18 2035 06/21/18 0313  TROPONINI 2.72* 2.76* 2.10*   BNP (last 3 results) No results for input(s): BNP in the last 8760 hours.  ProBNP (last 3 results) No results for input(s): PROBNP in the last 8760 hours.    Studies: No results found.  Scheduled Meds: . alfuzosin  10 mg Oral Q breakfast  . brimonidine  1 drop Both Eyes BID  . carvedilol  6.25 mg Oral BID WC  . doxycycline  100 mg Oral Q12H  . ferrous sulfate  325 mg Oral Daily  . furosemide  40 mg Oral Daily  . guaiFENesin  600 mg Oral BID  . insulin aspart  0-15 Units Subcutaneous TID WC  . insulin aspart  0-5 Units Subcutaneous QHS  . insulin glargine  15 Units Subcutaneous QHS  . insulin glargine  5 Units Subcutaneous Daily  . ipratropium-albuterol  3 mL Nebulization BID  . Melatonin  3 tablet Oral QHS  . methylPREDNISolone (SOLU-MEDROL) injection  40 mg Intravenous Q12H  . mirabegron ER  50 mg Oral Daily  . potassium chloride  10 mEq Oral Daily  . sacubitril-valsartan  1 tablet Oral BID  . sodium chloride flush  3 mL Intravenous Q12H  . timolol  1 drop Both Eyes BID  . traZODone  50 mg Oral QHS     Huey Bienenstockawood Lindsay Soulliere MD   [facility] Pager [phone]. If 7PM-7AM, please contact night-coverage at [email or web address], Office  [phone]  password [email or web address]  06/24/2018, 1:56 PM  LOS: 4 days

## 2018-06-24 NOTE — Consult Note (Signed)
Ref: Merri BrunettePharr, Walter, MD   Subjective:  Cough and shortness of breath persist.   Objective:  Vital Signs in the last 24 hours: Temp:  [98.1 F (36.7 C)-98.5 F (36.9 C)] 98.4 F (36.9 C) (01/01 0758) Pulse Rate:  [82-100] 87 (01/01 0758) Cardiac Rhythm: Normal sinus rhythm (01/01 0726) Resp:  [15-18] 15 (01/01 0758) BP: (99-120)/(59-73) 120/72 (01/01 0758) SpO2:  [96 %-99 %] 96 % (01/01 0758) Weight:  [74.4 kg] 74.4 kg (01/01 0444)  Physical Exam: BP Readings from Last 1 Encounters:  06/24/18 120/72     Wt Readings from Last 1 Encounters:  06/24/18 74.4 kg    Weight change: -0.726 kg Body mass index is 32.05 kg/m. HEENT: [place]/AT, Eyes-Blue, PERL, EOMI, Conjunctiva-Pink, Sclera-Non-icteric Neck: No JVD, No bruit, Trachea midline. Lungs:  Clearing, Bilateral. Cardiac:  Regular rhythm, normal S1 and S2, no S3. II/VI systolic murmur. Abdomen:  Soft, non-tender. BS present. Extremities:  No edema present. No cyanosis. No clubbing. CNS: AxOx3, Cranial nerves grossly intact, moves all 4 extremities.  Skin: Warm and dry.   Intake/Output from previous day: 12/31 0701 - 01/01 0700 In: 1468 [P.O.:1140; I.V.:328] Out: 1625 [Urine:1625]    Lab Results: BMET    Component Value Date/Time   NA 136 06/24/2018 0434   NA 132 (L) 06/22/2018 0354   NA 136 06/21/2018 0313   K 4.4 06/24/2018 0434   K 4.1 06/22/2018 0354   K 3.3 (L) 06/21/2018 0313   CL 106 06/24/2018 0434   CL 102 06/22/2018 0354   CL 105 06/21/2018 0313   CO2 20 (L) 06/24/2018 0434   CO2 15 (L) 06/22/2018 0354   CO2 19 (L) 06/21/2018 0313   GLUCOSE 289 (H) 06/24/2018 0434   GLUCOSE 265 (H) 06/22/2018 0354   GLUCOSE 248 (H) 06/21/2018 0313   BUN 30 (H) 06/24/2018 0434   BUN 18 06/22/2018 0354   BUN 18 06/21/2018 0313   CREATININE 1.01 06/24/2018 0434   CREATININE 1.15 06/22/2018 0354   CREATININE 1.12 06/21/2018 0313   CALCIUM 7.3 (L) 06/24/2018 0434   CALCIUM 7.1 (L) 06/22/2018 0354   CALCIUM 7.3 (L)  06/21/2018 0313   GFRNONAA >60 06/24/2018 0434   GFRNONAA 59 (L) 06/22/2018 0354   GFRNONAA >60 06/21/2018 0313   GFRAA >60 06/24/2018 0434   GFRAA >60 06/22/2018 0354   GFRAA >60 06/21/2018 0313   CBC    Component Value Date/Time   WBC 7.6 06/22/2018 0354   RBC 3.80 (L) 06/22/2018 0354   HGB 10.1 (L) 06/22/2018 0354   HCT 31.0 (L) 06/22/2018 0354   PLT 136 (L) 06/22/2018 0354   MCV 81.6 06/22/2018 0354   MCH 26.6 06/22/2018 0354   MCHC 32.6 06/22/2018 0354   RDW 17.4 (H) 06/22/2018 0354   LYMPHSABS 0.6 (L) 06/20/2018 0600   MONOABS 1.4 (H) 06/20/2018 0600   EOSABS 0.0 06/20/2018 0600   BASOSABS 0.0 06/20/2018 0600   HEPATIC Function Panel Recent Labs    06/20/18 0600  PROT 6.8   HEMOGLOBIN A1C No components found for: HGA1C,  MPG CARDIAC ENZYMES Lab Results  Component Value Date   TROPONINI 2.10 (HH) 06/21/2018   TROPONINI 2.76 (HH) 06/20/2018   TROPONINI 2.72 (HH) 06/20/2018   BNP No results for input(s): PROBNP in the last 8760 hours. TSH No results for input(s): TSH in the last 8760 hours. CHOLESTEROL No results for input(s): CHOL in the last 8760 hours.  Scheduled Meds: . alfuzosin  10 mg Oral Q breakfast  .  brimonidine  1 drop Both Eyes BID  . carvedilol  6.25 mg Oral BID WC  . doxycycline  100 mg Oral Q12H  . ferrous sulfate  325 mg Oral Daily  . furosemide  40 mg Oral Daily  . guaiFENesin  600 mg Oral BID  . insulin aspart  0-15 Units Subcutaneous TID WC  . insulin aspart  0-5 Units Subcutaneous QHS  . insulin glargine  15 Units Subcutaneous QHS  . insulin glargine  5 Units Subcutaneous Daily  . ipratropium-albuterol  3 mL Nebulization BID  . Melatonin  3 tablet Oral QHS  . methylPREDNISolone (SOLU-MEDROL) injection  40 mg Intravenous Q12H  . mirabegron ER  50 mg Oral Daily  . potassium chloride  10 mEq Oral Daily  . sacubitril-valsartan  1 tablet Oral BID  . sodium chloride flush  3 mL Intravenous Q12H  . timolol  1 drop Both Eyes BID  .  traZODone  50 mg Oral QHS   Continuous Infusions: . heparin 1,050 Units/hr (06/24/18 0600)   PRN Meds:.acetaminophen, benzonatate, chlorpheniramine-HYDROcodone, ondansetron **OR** ondansetron (ZOFRAN) IV, oxyCODONE  Assessment/Plan: NSTEMI Near syncope Ischemic cardiomyopathy CAD Uncontrolled type 2 DM Acute bronchitis  Add oral lasix.   LOS: 4 days    Orpah Cobb  MD  06/24/2018, 8:44 AM

## 2018-06-25 LAB — CULTURE, BLOOD (ROUTINE X 2)
Culture: NO GROWTH
Culture: NO GROWTH
SPECIAL REQUESTS: ADEQUATE
Special Requests: ADEQUATE

## 2018-06-25 LAB — GLUCOSE, CAPILLARY
GLUCOSE-CAPILLARY: 235 mg/dL — AB (ref 70–99)
Glucose-Capillary: 146 mg/dL — ABNORMAL HIGH (ref 70–99)
Glucose-Capillary: 183 mg/dL — ABNORMAL HIGH (ref 70–99)
Glucose-Capillary: 229 mg/dL — ABNORMAL HIGH (ref 70–99)

## 2018-06-25 MED ORDER — ATORVASTATIN CALCIUM 10 MG PO TABS
20.0000 mg | ORAL_TABLET | Freq: Every day | ORAL | Status: DC
  Administered 2018-06-25: 20 mg via ORAL
  Filled 2018-06-25: qty 2

## 2018-06-25 MED ORDER — PREDNISONE 10 MG PO TABS
10.0000 mg | ORAL_TABLET | Freq: Every day | ORAL | Status: DC
  Administered 2018-06-26: 10 mg via ORAL
  Filled 2018-06-25: qty 1

## 2018-06-25 NOTE — Consult Note (Signed)
Ref: Matthew Calderon, Walter, MD   Subjective:  Patient aware of MI. Prefers medical therapy. Dry cough persist.  Objective:  Vital Signs in the last 24 hours: Temp:  [98 F (36.7 C)-98.3 F (36.8 C)] 98.3 F (36.8 C) (01/02 0428) Pulse Rate:  [79-99] 99 (01/02 0827) Cardiac Rhythm: Normal sinus rhythm (01/02 0800) Resp:  [16-29] 18 (01/02 0827) BP: (104-125)/(60-69) 104/62 (01/02 0428) SpO2:  [95 %-96 %] 95 % (01/02 0827) Weight:  [73.8 kg] 73.8 kg (01/02 0428)  Physical Exam: BP Readings from Last 1 Encounters:  06/25/18 104/62     Wt Readings from Last 1 Encounters:  06/25/18 73.8 kg    Weight change: -0.635 kg Body mass index is 31.78 kg/m. HEENT: Matthew Calderon, Eyes-Blue, PERL, EOMI, Conjunctiva-Pale pink, Sclera-Non-icteric Neck: No JVD, No bruit, Trachea midline. Lungs:  Clearing, Bilateral. Cardiac:  Regular rhythm, normal S1 and S2, no S3. II/VI systolic murmur. Abdomen:  Soft, non-tender. BS present. Extremities:  No edema present. No cyanosis. No clubbing. CNS: AxOx3, Cranial nerves grossly intact, moves all 4 extremities.  Skin: Warm and dry.   Intake/Output from previous day: 01/01 0701 - 01/02 0700 In: 360 [P.O.:360] Out: 200 [Urine:200]    Lab Results: BMET    Component Value Date/Time   NA 136 06/24/2018 0434   NA 132 (L) 06/22/2018 0354   NA 136 06/21/2018 0313   K 4.4 06/24/2018 0434   K 4.1 06/22/2018 0354   K 3.3 (L) 06/21/2018 0313   CL 106 06/24/2018 0434   CL 102 06/22/2018 0354   CL 105 06/21/2018 0313   CO2 20 (L) 06/24/2018 0434   CO2 15 (L) 06/22/2018 0354   CO2 19 (L) 06/21/2018 0313   GLUCOSE 289 (H) 06/24/2018 0434   GLUCOSE 265 (H) 06/22/2018 0354   GLUCOSE 248 (H) 06/21/2018 0313   BUN 30 (H) 06/24/2018 0434   BUN 18 06/22/2018 0354   BUN 18 06/21/2018 0313   CREATININE 1.01 06/24/2018 0434   CREATININE 1.15 06/22/2018 0354   CREATININE 1.12 06/21/2018 0313   CALCIUM 7.3 (L) 06/24/2018 0434   CALCIUM 7.1 (L) 06/22/2018 0354   CALCIUM 7.3 (L) 06/21/2018 0313   GFRNONAA >60 06/24/2018 0434   GFRNONAA 59 (L) 06/22/2018 0354   GFRNONAA >60 06/21/2018 0313   GFRAA >60 06/24/2018 0434   GFRAA >60 06/22/2018 0354   GFRAA >60 06/21/2018 0313   CBC    Component Value Date/Time   WBC 7.6 06/22/2018 0354   RBC 3.80 (L) 06/22/2018 0354   HGB 10.1 (L) 06/22/2018 0354   HCT 31.0 (L) 06/22/2018 0354   PLT 136 (L) 06/22/2018 0354   MCV 81.6 06/22/2018 0354   MCH 26.6 06/22/2018 0354   MCHC 32.6 06/22/2018 0354   RDW 17.4 (H) 06/22/2018 0354   LYMPHSABS 0.6 (L) 06/20/2018 0600   MONOABS 1.4 (H) 06/20/2018 0600   EOSABS 0.0 06/20/2018 0600   BASOSABS 0.0 06/20/2018 0600   HEPATIC Function Panel Recent Labs    06/20/18 0600  PROT 6.8   HEMOGLOBIN A1C No components found for: HGA1C,  MPG CARDIAC ENZYMES Lab Results  Component Value Date   TROPONINI 2.10 (HH) 06/21/2018   TROPONINI 2.76 (HH) 06/20/2018   TROPONINI 2.72 (HH) 06/20/2018   BNP No results for input(s): PROBNP in the last 8760 hours. TSH No results for input(s): TSH in the last 8760 hours. CHOLESTEROL No results for input(s): CHOL in the last 8760 hours.  Scheduled Meds: . alfuzosin  10 mg Oral Q breakfast  .  aspirin EC  81 mg Oral Daily  . brimonidine  1 drop Both Eyes BID  . carvedilol  6.25 mg Oral BID WC  . doxycycline  100 mg Oral Q12H  . ferrous sulfate  325 mg Oral Daily  . furosemide  40 mg Oral Daily  . guaiFENesin  600 mg Oral BID  . insulin aspart  0-15 Units Subcutaneous TID WC  . insulin aspart  0-5 Units Subcutaneous QHS  . insulin glargine  10 Units Subcutaneous Daily  . insulin glargine  15 Units Subcutaneous QHS  . insulin glargine  5 Units Subcutaneous Once  . ipratropium-albuterol  3 mL Nebulization BID  . Melatonin  3 tablet Oral QHS  . mirabegron ER  50 mg Oral Daily  . pantoprazole  40 mg Oral Daily  . potassium chloride  10 mEq Oral Daily  . predniSONE  30 mg Oral Q breakfast  . sacubitril-valsartan  1  tablet Oral BID  . sodium chloride flush  3 mL Intravenous Q12H  . timolol  1 drop Both Eyes BID  . traZODone  50 mg Oral QHS   Continuous Infusions: PRN Meds:.acetaminophen, benzonatate, chlorpheniramine-HYDROcodone, ondansetron **OR** ondansetron (ZOFRAN) IV, oxyCODONE  Assessment/Plan: NSTEMI Near syncope Ischemic cardiomyopathy CAD Uncontrolled type 2 DM Acute bronchitis  Continue medical treatment.   LOS: 5 days    Matthew Cobb  MD  06/25/2018, 8:59 AM

## 2018-06-25 NOTE — Progress Notes (Signed)
[facility]  PROGRESS NOTE  Matthew Calderon JAS:[id number] DOB: 10-Nov-1932 DOA: 06/20/2018 PCP: Merri Brunette, MD   Brief HPI:    85 year old male with a history of obstructive sleep apnea not on therapy allergic rhinitis, BPH, GERD, type 2 diabetes mellitus, depression, arthritis, anemia who came to hospital after patient apparently passed out in the bathroom.  Patient says that he remembers falling down and describes symptoms more like near syncope.  In the ED blood work showed elevated troponin and patient was started on IV heparin for non-STEMI.  Cardiology was consulted.   Subjective   Patient seen and examined, denies chest pain or shortness of breath.  Still coughing up but having hard time coughing up phlegm.   Assessment/Plan:    Non-STEMI -patient admitted with elevated troponin, non-STEMI.  Heparin GTT started.  Cardiology  consulted.  -Echo showing severe hypokinesis, with drop in EF to 25% -Discussed with cardiology, he discussed with family, they opted for medical management only. -Initially on heparin GTT, stopped 06/24/2018, continue with medical management, on aspirin, beta-blockers and Entresto , I will check lipid panel, will start on low-dose statin . -Protonix for GI prophylaxis.  Acute systolic CHF with ischemic cardiomyopathy -on Beta-blockers, Entresto -medicalmanagement per cardiology   Acute bronchitis-improved - patient presented with symptoms of acute bronchitis, bilateral wheezing, but acute on IV antibiotics vancomycin and cefepime on admission, have been stopped . -On IV steroids, currently on oral prednisone taper.  Diabetes mellitus type 2 -Ruben A1c is 7.6 -continue sliding scale insulin with NovoLog, CBG proved after tapering steroids   Near syncope-patient is describing symptoms of postural hypotension, will check orthostatic vital signs every shift.  BPH-continue Myrbetriq, Uroxatrol  Insomnia-continue  trazodone  Hypokalemia-replete     CBG: Recent Labs  Lab 06/24/18 1116 06/24/18 1647 06/24/18 2301 06/25/18 0736 06/25/18 1203  GLUCAP 378* 215* 222* 146* 183*    CBC: Recent Labs  Lab 06/20/18 0600 06/21/18 0313 06/22/18 0354  WBC 12.1* 8.7 7.6  NEUTROABS 10.0*  --   --   HGB 12.0* 10.0* 10.1*  HCT 37.0* 32.0* 31.0*  MCV 82.2 81.8 81.6  PLT 171 137* 136*    Basic Metabolic Panel: Recent Labs  Lab 06/20/18 0600 06/21/18 0313 06/22/18 0354 06/24/18 0434  NA 135 136 132* 136  K 3.5 3.3* 4.1 4.4  CL 100 105 102 106  CO2 20* 19* 15* 20*  GLUCOSE 462* 248* 265* 289*  BUN 22 18 18  30*  CREATININE 1.26* 1.12 1.15 1.01  CALCIUM 8.5* 7.3* 7.1* 7.3*     DVT prophylaxis: SCD  Code Status: Full code  Family Communication: No family at bedside  Disposition Plan: Skilled nursing facility pending insurance approval  Consultants:  Cardiology  Procedures:  None   Antibiotics:   Anti-infectives (From admission, onward)   Start     Dose/Rate Route Frequency Ordered Stop   06/24/18 1800  meropenem (MERREM) 2 g in sodium chloride 0.9 % 100 mL IVPB  Status:  Discontinued     2 g 200 mL/hr over 30 Minutes Intravenous Every 8 hours 06/24/18 1508 06/24/18 1511   06/21/18 2200  vancomycin (VANCOCIN) IVPB 750 mg/150 ml premix  Status:  Discontinued     750 mg 150 mL/hr over 60 Minutes Intravenous Every 36 hours 06/20/18 1012 06/20/18 1525   06/21/18 2200  doxycycline (VIBRA-TABS) tablet 100 mg     100 mg Oral Every 12 hours 06/21/18 1602     06/21/18 1000  ceFEPIme (MAXIPIME) 2  g in sodium chloride 0.9 % 100 mL IVPB  Status:  Discontinued     2 g 200 mL/hr over 30 Minutes Intravenous Every 24 hours 06/20/18 1012 06/20/18 1525   06/20/18 1015  ceFEPIme (MAXIPIME) 2 g in sodium chloride 0.9 % 100 mL IVPB     2 g 200 mL/hr over 30 Minutes Intravenous  Once 06/20/18 1000 06/20/18 1206   06/20/18 0900  vancomycin (VANCOCIN) 1,500 mg in sodium chloride 0.9 % 500 mL  IVPB  Status:  Discontinued     1,500 mg 250 mL/hr over 120 Minutes Intravenous  Once 06/20/18 0850 06/20/18 0851   06/20/18 0900  vancomycin (VANCOCIN) 1,500 mg in sodium chloride 0.9 % 500 mL IVPB     1,500 mg 250 mL/hr over 120 Minutes Intravenous  Once 06/20/18 0851 06/20/18 1205       Objective   Vitals:   06/24/18 2040 06/24/18 2300 06/25/18 0428 06/25/18 0827  BP:  112/60 104/62   Pulse:  79 83 99  Resp:  18 16 18   Temp:  98 F (36.7 C) 98.3 F (36.8 C)   TempSrc:  Oral Oral   SpO2: 96% 96% 95% 95%  Weight:   73.8 kg   Height:        Intake/Output Summary (Last 24 hours) at 06/25/2018 1330 Last data filed at 06/25/2018 0400 Gross per 24 hour  Intake 360 ml  Output 200 ml  Net 160 ml   Filed Weights   06/23/18 0446 06/24/18 0444 06/25/18 0428  Weight: 75.2 kg 74.4 kg 73.8 kg     Physical Examination:  Awake Alert, Oriented X 3, No new F.N deficits, Normal affect Symmetrical Chest wall movement, Good air movement bilaterally, CTAB RRR,No Gallops,Rubs or new Murmurs, No Parasternal Heave +ve B.Sounds, Abd Soft, No tenderness, No rebound - guarding or rigidity. No Cyanosis, Clubbing or edema, No new Rash or bruise       Data Reviewed: I have personally reviewed following labs and imaging studies   Recent Results (from the past 240 hour(s))  Blood culture (routine x 2)     Status: None (Preliminary result)   Collection Time: 06/20/18  6:30 AM  Result Value Ref Range Status   Specimen Description BLOOD RIGHT ANTECUBITAL  Final   Special Requests   Final    BOTTLES DRAWN AEROBIC AND ANAEROBIC Blood Culture adequate volume   Culture   Final    NO GROWTH 4 DAYS Performed at [facility], [address] [place], [place] [address]    Report Status PENDING  Incomplete  Blood culture (routine x 2)     Status: None (Preliminary result)   Collection Time: 06/20/18  6:35 AM  Result Value Ref Range Status   Specimen Description BLOOD RIGHT HAND  Final    Special Requests   Final    BOTTLES DRAWN AEROBIC AND ANAEROBIC Blood Culture adequate volume   Culture   Final    NO GROWTH 4 DAYS Performed at [facility], [address] [place], [place] [address]    Report Status PENDING  Incomplete     Liver Function Tests: Recent Labs  Lab 06/20/18 0600  AST 33  ALT 17  ALKPHOS 66  BILITOT 0.6  PROT 6.8  ALBUMIN 3.7   No results for input(s): LIPASE, AMYLASE in the last 168 hours. No results for input(s): AMMONIA in the last 168 hours.  Cardiac Enzymes: Recent Labs  Lab 06/20/18 1531 06/20/18 2035 06/21/18  0313  TROPONINI 2.72* 2.76* 2.10*   BNP (last 3 results) No results for input(s): BNP in the last 8760 hours.  ProBNP (last 3 results) No results for input(s): PROBNP in the last 8760 hours.    Studies: No results found.  Scheduled Meds: . alfuzosin  10 mg Oral Q breakfast  . aspirin EC  81 mg Oral Daily  . brimonidine  1 drop Both Eyes BID  . carvedilol  6.25 mg Oral BID WC  . doxycycline  100 mg Oral Q12H  . ferrous sulfate  325 mg Oral Daily  . furosemide  40 mg Oral Daily  . guaiFENesin  600 mg Oral BID  . insulin aspart  0-15 Units Subcutaneous TID WC  . insulin aspart  0-5 Units Subcutaneous QHS  . insulin glargine  10 Units Subcutaneous Daily  . insulin glargine  15 Units Subcutaneous QHS  . insulin glargine  5 Units Subcutaneous Once  . ipratropium-albuterol  3 mL Nebulization BID  . Melatonin  3 tablet Oral QHS  . mirabegron ER  50 mg Oral Daily  . pantoprazole  40 mg Oral Daily  . potassium chloride  10 mEq Oral Daily  . predniSONE  30 mg Oral Q breakfast  . sacubitril-valsartan  1 tablet Oral BID  . sodium chloride flush  3 mL Intravenous Q12H  . timolol  1 drop Both Eyes BID  . traZODone  50 mg Oral QHS     Huey Bienenstockawood  MD   [facility] Pager [phone].  If 7PM-7AM, please contact night-coverage at [email or web address], Office  [phone]  password [email or web address]  06/25/2018, 1:30  PM  LOS: 5 days

## 2018-06-25 NOTE — Progress Notes (Signed)
Continuing to await [facility] authorization for SNF. CSW to follow.  Abigail Butts, LCSW [phone]

## 2018-06-25 NOTE — Progress Notes (Signed)
Physical Therapy Treatment Patient Details Name: Matthew Calderon MRN: [id number] DOB: 21-Mar-1933 Today's Date: 06/25/2018    History of Present Illness Patient is an 85 y.o. male who presents 06/20/18 with a cough and cold, also post-fall in bathroom; admitted with NSTEMI and acute bronchitis. PMH includes DM, depression, anxiety.   PT Comments    Pt progressing with mobility. Able to increase ambulation distance with RW and min guard, requiring minA to prevent LOB. Pt with slowed gait speed, generalized weakness, decreased activity tolerance and poor balance strategies, placing pt at increased risk for falls. Continue to recommend SNF-level therapies. Pt motivated to participate in post-acute rehab in order to regain functional mobility and independence. Will follow acutely.   Follow Up Recommendations  SNF;Supervision for mobility/OOB     Equipment Recommendations  None recommended by PT    Recommendations for Other Services       Precautions / Restrictions Precautions Precautions: Fall Restrictions Weight Bearing Restrictions: No    Mobility  Bed Mobility Overal bed mobility: Needs Assistance Bed Mobility: Rolling;Sidelying to Sit Rolling: Min guard Sidelying to sit: Min guard;HOB elevated       General bed mobility comments: Reliant on bed rail to push into sitting; denies dizziness. Increased time and effort  Transfers Overall transfer level: Needs assistance Equipment used: Rolling walker (2 wheeled) Transfers: Sit to/from Stand Sit to Stand: Min assist         General transfer comment: Reliant on momentum and minA to power into standing  Ambulation/Gait Ambulation/Gait assistance: Min guard;Min Chemical engineer (Feet): 120 Feet Assistive device: Rolling walker (2 wheeled) Gait Pattern/deviations: Step-through pattern;Decreased stride length;Trunk flexed Gait velocity: Decreased Gait velocity interpretation: <1.8 ft/sec, indicate of risk for recurrent  falls General Gait Details: Slow, mildly unsteady ambulation with RW and close min guard for balance. Heavy reliance on RW to lean due to chronic back pain. Pt taking hand off walker while walking requiring minA to prevent LOB. Repeated cues to self-monitor activity tolerance   Stairs             Wheelchair Mobility    Modified Rankin (Stroke Patients Only)       Balance Overall balance assessment: History of Falls;Needs assistance Sitting-balance support: Feet supported;No upper extremity supported Sitting balance-Leahy Scale: Fair Sitting balance - Comments: Required assist to don socks   Standing balance support: During functional activity Standing balance-Leahy Scale: Poor Standing balance comment: Reliant on UE support while standing to perform functional tasks at sink                            Cognition Arousal/Alertness: Awake/alert Behavior During Therapy: WFL for tasks assessed/performed Overall Cognitive Status: No family/caregiver present to determine baseline cognitive functioning                                 General Comments: Some repetitiveness noted; pt had reported some memory issues      Exercises      General Comments        Pertinent Vitals/Pain Pain Assessment: Faces Faces Pain Scale: Hurts little more Pain Location: L rib Pain Descriptors / Indicators: Sore;Discomfort Pain Intervention(s): Monitored during session;Limited activity within patient's tolerance    Home Living                      Prior Function  PT Goals (current goals can now be found in the care plan section) Acute Rehab PT Goals Patient Stated Goal: Motivated for rehab at SNF; "I don't want to leave here until I'm ready" PT Goal Formulation: With patient Time For Goal Achievement: 07/06/18 Potential to Achieve Goals: Good Progress towards PT goals: Progressing toward goals    Frequency    Min 2X/week      PT  Plan Current plan remains appropriate    Co-evaluation              AM-PAC PT "6 Clicks" Mobility   Outcome Measure  Help needed turning from your back to your side while in a flat bed without using bedrails?: A Little Help needed moving from lying on your back to sitting on the side of a flat bed without using bedrails?: A Little Help needed moving to and from a bed to a chair (including a wheelchair)?: A Little Help needed standing up from a chair using your arms (e.g., wheelchair or bedside chair)?: A Little Help needed to walk in hospital room?: A Little Help needed climbing 3-5 steps with a railing? : A Lot 6 Click Score: 17    End of Session Equipment Utilized During Treatment: Gait belt Activity Tolerance: Patient tolerated treatment well Patient left: with nursing/sitter in room(seated at sink with NT for washup) Nurse Communication: Mobility status PT Visit Diagnosis: Unsteadiness on feet (R26.81);Difficulty in walking, not elsewhere classified (R26.2)     Time: 2162-4469 PT Time Calculation (min) (ACUTE ONLY): 24 min  Charges:  $Gait Training: 8-22 mins $Therapeutic Activity: 8-22 mins                    Ina Homes, PT, DPT Acute Rehabilitation Services  Pager [phone] Office [phone]  Matthew Calderon 06/25/2018, 9:38 AM

## 2018-06-26 LAB — LIPID PANEL
Cholesterol: 153 mg/dL (ref 0–200)
HDL: 41 mg/dL (ref >40)
LDL Cholesterol: 87 mg/dL (ref 0–99)
Total CHOL/HDL Ratio: 3.7 RATIO
Triglycerides: 126 mg/dL (ref <150)
VLDL: 25 mg/dL (ref 0–40)

## 2018-06-26 LAB — GLUCOSE, CAPILLARY
GLUCOSE-CAPILLARY: 247 mg/dL — AB (ref 70–99)
Glucose-Capillary: 93 mg/dL (ref 70–99)

## 2018-06-26 MED ORDER — ACETAMINOPHEN 325 MG PO TABS: 650.0000 mg | ORAL_TABLET | Freq: Four times a day (QID) | ORAL | Status: DC | PRN

## 2018-06-26 MED ORDER — CARVEDILOL 12.5 MG PO TABS: 12.5000 mg | ORAL_TABLET | Freq: Two times a day (BID) | ORAL | Status: DC

## 2018-06-26 MED ORDER — SACUBITRIL-VALSARTAN 24-26 MG PO TABS: 1.0000 | ORAL_TABLET | Freq: Two times a day (BID) | ORAL | Status: DC

## 2018-06-26 MED ORDER — ATORVASTATIN CALCIUM 40 MG PO TABS: 40.0000 mg | ORAL_TABLET | Freq: Every day | ORAL | Status: DC

## 2018-06-26 MED ORDER — GUAIFENESIN ER 600 MG PO TB12: 600.0000 mg | ORAL_TABLET | Freq: Two times a day (BID) | ORAL | Status: DC

## 2018-06-26 MED ORDER — INSULIN GLARGINE 100 UNIT/ML ~~LOC~~ SOLN: 12.0000 [IU] | Freq: Every day | SUBCUTANEOUS | 11 refills | Status: DC

## 2018-06-26 MED ORDER — INSULIN GLARGINE 100 UNIT/ML ~~LOC~~ SOLN: 10.0000 [IU] | Freq: Two times a day (BID) | SUBCUTANEOUS | 11 refills | Status: DC

## 2018-06-26 MED ORDER — ASPIRIN 81 MG PO TBEC: 81.0000 mg | DELAYED_RELEASE_TABLET | Freq: Every day | ORAL | Status: DC

## 2018-06-26 MED ORDER — INSULIN ASPART 100 UNIT/ML ~~LOC~~ SOLN: 0.0000 [IU] | Freq: Three times a day (TID) | SUBCUTANEOUS | 11 refills | Status: DC

## 2018-06-26 MED ORDER — PANTOPRAZOLE SODIUM 40 MG PO TBEC: 40.0000 mg | DELAYED_RELEASE_TABLET | Freq: Every day | ORAL | Status: DC

## 2018-06-26 MED ORDER — POTASSIUM CHLORIDE CRYS ER 10 MEQ PO TBCR: 10.0000 meq | EXTENDED_RELEASE_TABLET | Freq: Every day | ORAL | Status: DC

## 2018-06-26 MED ORDER — FUROSEMIDE 40 MG PO TABS: 40.0000 mg | ORAL_TABLET | Freq: Every day | ORAL | Status: DC

## 2018-06-26 NOTE — Discharge Summary (Signed)
Matthew Calderon, is a 85 y.o. male  DOB 1933-06-19  MRN [id number].  Admission date:  06/20/2018  Admitting Physician  Inez Catalina, MD  Discharge Date:  06/26/2018   Primary MD  Merri Brunette, MD  Recommendations for primary care physician for things to follow:  -Check CBC, BMP in 3 days. -Patient will need to follow with cardiology in 4 weeks from discharge -Encourage people to keep using incentive spirometry and flutter valve at facility   Admission Diagnosis  Hyperglycemia [R73.9] NSTEMI (non-ST elevated myocardial infarction) (HCC) [I21.4] Syncope, cardiogenic [R55]   Discharge Diagnosis  Hyperglycemia [R73.9] NSTEMI (non-ST elevated myocardial infarction) (HCC) [I21.4] Syncope, cardiogenic [R55]    Active Problems:   Diabetes (HCC)   NSTEMI (non-ST elevated myocardial infarction) (HCC)   Prostate hyperplasia, benign localized, without urinary obstruction      Past Medical History:  Diagnosis Date  . Anemia    takes iron pill  . Anxiety   . Arthritis   . Depression   . Diabetes mellitus without complication (HCC)   . GERD (gastroesophageal reflux disease)    pepto  . H/O hiatal hernia   . Pneumonia    4 years ago  . Post-nasal drip   . Prostate hyperplasia, benign localized, without urinary obstruction   . Seasonal allergies   . Sleep apnea    no CPAP    Past Surgical History:  Procedure Laterality Date  . BACK SURGERY  1995   discectomy  . COLONOSCOPY W/ BIOPSIES AND POLYPECTOMY    . EYE SURGERY Bilateral    cataracts  . EYE SURGERY Bilateral    glaucoma shunts  . JOINT REPLACEMENT Left 1992   knee  . JOINT REPLACEMENT Right 1996   knee  . JOINT REPLACEMENT Right 2009   hip  . JOINT REPLACEMENT Left 2005   hip  . KNEE ARTHROSCOPY Left 1981  . KNEE ARTHROSCOPY WITH PATELLA RECONSTRUCTION Right 2010  . KNEE SURGERY Bilateral 1954   knee surgery and placed in a  cast for 6 weeks  . KNEE SURGERY  1961   cartilage removed  . LEG SURGERY Bilateral 1974, 1975   straighten legs  . SHOULDER OPEN ROTATOR CUFF REPAIR Right 2010  . TONSILLECTOMY    . TOTAL HIP ARTHROPLASTY Right 01/13/2014   Procedure: TOTAL HIP ARTHROPLASTY ANTERIOR APPROACH;  Surgeon: Velna Ochs, MD;  Location: [facility] OR;  Service: Orthopedics;  Laterality: Right;       History of present illness and  Hospital Course:     Kindly see H&P for history of present illness and admission details, please review complete Labs, Consult reports and Test reports for all details in brief  HPI  from the history and physical done on the day of admission 06/20/2018   HPI: Matthew Calderon is a 85 y.o. male with medical history significant of OSA not on therapy, allergic rhinitis, BPH, GERD, DM2, Depression, Arthritis, anemia who presents after passing out.  He reports that starting last week, while visiting  family, he was not feeling his normal self.  He notes that he was more sleepy than usual and was cold a lot of the time.  He returned home and developed a cough which is productive.  He has also had rhinorrhea.  On the morning of admission, he was confused and he found himself in his bathroom crawling on the floor looking for the commode.  He thinks after that time he passed out.  He was able to call EMS and came to the hospital.  He denies chest pain, SOB, dysuria.  He has chronic BPH and what sounds like very severe LUTS.  He notes frequent nocturia, difficulty starting his stream and dribbling.  He is reported as being on tamsulosin, but he is not sure, and he has a sulfa allergy as well.    ED Course: He has had an extensive work up in the ED.  Initial reseults showed a glucose of 462, Cr of 1.26 (normal baseline), CO2 of 20, AG of 15, WBC of 12.1, TnI of 1.08 --> 1.10 --> 2.65.  Lactate of 2.98 --> 1.45. Flu negative.  UA with > 500 glucose, moderate hemoglobin and 20 ketones.  BC X 2 were sent. CTA  of the chest was negative for PE or acute chest abnormality, no pneumonia and a possible right kidney stone.  Kidney stone CT did not show a kidney stone (he did have moderate Hgb on UA with few red cells).   Cardiology was consulted for rising Troponin.   Hospital Course   85 year old male with a history of obstructive sleep apnea not on therapy allergic rhinitis, BPH, GERD, type 2 diabetes mellitus, depression, arthritis, anemia who came to hospital after patient apparently passed out in the bathroom.  Patient says that he remembers falling down and describes symptoms more like near syncope.  In the ED blood work showed elevated troponin and patient was started on IV heparin for non-STEMI.  Cardiology was consulted.  Non-STEMI -patient admitted with elevated troponin, non-STEMI.  Heparin GTT started.  Cardiology  consulted.  -Echo showing severe hypokinesis, with drop in EF to 25% -Discussed with cardiology, he discussed with family, they opted for medical management only. -Initially on heparin GTT, stopped 06/24/2018, continue with medical management, on aspirin, beta-blockers and Entresto , and statin. -Protonix for GI prophylaxis.  Acute systolic CHF with ischemic cardiomyopathy -on Beta-blockers, Entresto -medical management per cardiology -Appears to be euvolemic, will be discharged on Lasix   Acute bronchitis-improved - patient presented with symptoms of acute bronchitis, bilateral wheezing, on antibiotics and steroids, has been stopped by the time of discharge..  Diabetes mellitus type 2 - A1c is 7.6 -CBGs were uncontrolled during hospital stay, most likely in the setting of steroid use, he will be discharged on Lantus 12 units subcu daily, insulin sliding scale, he will be resumed on his home dose metformin.  Near syncope-patient is describing symptoms of postural hypotension, will check orthostatic vital signs every shift.  BPH-continue Myrbetriq,  Uroxatrol  Insomnia-continue trazodone  Hypokalemia-replete    Discharge Condition:  stable   Follow UP   Contact information for follow-up providers    Orpah Cobb, MD Follow up in 4 week(s).   Specialty:  Cardiology Contact information: [address] [place] [address] [phone]            Contact information for after-discharge care    Destination    [facility] .   Service:  Skilled Nursing Contact information: [address]  [address] [place] [address] [phone]                    Discharge Instructions  and  Discharge Medications    Discharge Instructions    Discharge instructions   Complete by:  As directed    Follow with Primary MD Merri Brunette, MD or SNF physician   Get CBC, CMP,  checked  by Primary MD next visit.    Activity: As tolerated with Full fall precautions use walker/cane & assistance as needed   Disposition SNF   Diet: Heart Healthy / carb modified, with feeding assistance and aspiration precautions.  For Heart failure patients - Check your Weight same time everyday, if you gain over 2 pounds, or you develop in leg swelling, experience more shortness of breath or chest pain, call your Primary MD immediately. Follow Cardiac Low Salt Diet and 1.5 lit/day fluid restriction.   On your next visit with your primary care physician please Get Medicines reviewed and adjusted.   Please request your Prim.MD to go over all Hospital Tests and Procedure/Radiological results at the follow up, please get all Hospital records sent to your Prim MD by signing hospital release before you go home.   If you experience worsening of your admission symptoms, develop shortness of breath, life threatening emergency, suicidal or homicidal thoughts you must seek medical attention immediately by calling 911 or calling your MD immediately  if symptoms less severe.  You Must read complete  instructions/literature along with all the possible adverse reactions/side effects for all the Medicines you take and that have been prescribed to you. Take any new Medicines after you have completely understood and accpet all the possible adverse reactions/side effects.   Do not drive, operating heavy machinery, perform activities at heights, swimming or participation in water activities or provide baby sitting services if your were admitted for syncope or siezures until you have seen by Primary MD or a Neurologist and advised to do so again.  Do not drive when taking Pain medications.    Do not take more than prescribed Pain, Sleep and Anxiety Medications  Special Instructions: If you have smoked or chewed Tobacco  in the last 2 yrs please stop smoking, stop any regular Alcohol  and or any Recreational drug use.  Wear Seat belts while driving.   Please note  You were cared for by a hospitalist during your hospital stay. If you have any questions about your discharge medications or the care you received while you were in the hospital after you are discharged, you can call the unit and asked to speak with the hospitalist on call if the hospitalist that took care of you is not available. Once you are discharged, your primary care physician will handle any further medical issues. Please note that NO REFILLS for any discharge medications will be authorized once you are discharged, as it is imperative that you return to your primary care physician (or establish a relationship with a primary care physician if you do not have one) for your aftercare needs so that they can reassess your need for medications and monitor your lab values.   Increase activity slowly   Complete by:  As directed      Allergies as of 06/26/2018      Reactions   Penicillins Hives   DID THE REACTION INVOLVE: Swelling of the face/tongue/throat, SOB, or low BP? No Sudden or severe rash/hives, skin peeling, or the inside of the  mouth or  nose? No Did it require medical treatment? No When did it last happen?childhood allergy If all above answers are "NO", may proceed with cephalosporin use.   Sulfa Antibiotics Hives      Medication List    STOP taking these medications   HYDROcodone-acetaminophen 5-325 MG tablet Commonly known as:  NORCO/VICODIN   traMADol 50 MG tablet Commonly known as:  ULTRAM   TUSSIN DM 10-100 MG/5ML liquid Generic drug:  dextromethorphan-guaiFENesin     TAKE these medications   acetaminophen 325 MG tablet Commonly known as:  TYLENOL Take 2 tablets (650 mg total) by mouth every 6 (six) hours as needed for mild pain or fever. What changed:    medication strength  how much to take  when to take this  reasons to take this   aspirin 81 MG EC tablet Take 1 tablet (81 mg total) by mouth daily. Start taking on:  June 27, 2018 What changed:    medication strength  how much to take  when to take this   atorvastatin 40 MG tablet Commonly known as:  LIPITOR Take 1 tablet (40 mg total) by mouth daily at 6 PM.   brimonidine 0.2 % ophthalmic solution Commonly known as:  ALPHAGAN Place 1 drop into both eyes 2 (two) times daily.   carvedilol 12.5 MG tablet Commonly known as:  COREG Take 1 tablet (12.5 mg total) by mouth 2 (two) times daily with a meal.   Cholecalciferol 25 MCG (1000 UT) tablet Take 2,000 Units by mouth every morning.   Ferrous Sulfate 143 (45 Fe) MG Tbcr Take 1 tablet by mouth daily.   furosemide 40 MG tablet Commonly known as:  LASIX Take 1 tablet (40 mg total) by mouth daily. Start taking on:  June 27, 2018   guaiFENesin 600 MG 12 hr tablet Commonly known as:  MUCINEX Take 1 tablet (600 mg total) by mouth 2 (two) times daily.   insulin aspart 100 UNIT/ML injection Commonly known as:  novoLOG Inject 0-15 Units into the skin 3 (three) times daily with meals.   insulin glargine 100 UNIT/ML injection Commonly known as:  LANTUS Inject  0.12 mLs (12 Units total) into the skin daily.   Melatonin 10 MG Tbcr Take 1 tablet by mouth at bedtime.   metFORMIN 500 MG 24 hr tablet Commonly known as:  GLUCOPHAGE-XR Take 1,500 mg by mouth daily with breakfast.   methocarbamol 500 MG tablet Commonly known as:  ROBAXIN Take 1 tablet (500 mg total) by mouth every 6 (six) hours as needed for muscle spasms.   MYRBETRIQ 50 MG Tb24 tablet Generic drug:  mirabegron ER Take 50 mg by mouth daily.   nystatin powder Commonly known as:  MYCOSTATIN/NYSTOP Apply 1 Bottle topically 2 (two) times daily.   pantoprazole 40 MG tablet Commonly known as:  PROTONIX Take 1 tablet (40 mg total) by mouth daily. Start taking on:  June 27, 2018   potassium chloride 10 MEQ tablet Commonly known as:  K-DUR,KLOR-CON Take 1 tablet (10 mEq total) by mouth daily. Start taking on:  June 27, 2018   PRESERVISION AREDS 2 Caps Take 2 capsules by mouth 2 (two) times daily.   sacubitril-valsartan 24-26 MG Commonly known as:  ENTRESTO Take 1 tablet by mouth 2 (two) times daily.   tamsulosin 0.4 MG Caps capsule Commonly known as:  FLOMAX Take 0.4 mg by mouth daily.   timolol 0.5 % ophthalmic solution Commonly known as:  TIMOPTIC Place 1 drop into both eyes 2 (two)  times daily.   traZODone 50 MG tablet Commonly known as:  DESYREL Take 50 mg by mouth at bedtime.         Diet and Activity recommendation: See Discharge Instructions above   Consults obtained -  Cardiology   Major procedures and Radiology Reports - PLEASE review detailed and final reports for all details, in brief -      Dg Chest 2 View  Result Date: 06/20/2018 CLINICAL DATA:  Dyspnea. EXAM: CHEST - 2 VIEW COMPARISON:  10/17/2015 FINDINGS: Low volume chest with accentuates heart size. Heart size is normal on the lateral view. Elevation of the right diaphragm that is chronic in from eventration based on the lateral view. There is no edema, consolidation, effusion, or  pneumothorax. Rounded density overlapping the midthoracic spine is stable from 2017 and attributed to lateral osteophyte IMPRESSION: Low volume chest without acute finding. Electronically Signed   By: Marnee Spring M.D.   On: 06/20/2018 07:08   Ct Head Wo Contrast  Result Date: 06/20/2018 CLINICAL DATA:  Patient status post syncopal episode. EXAM: CT HEAD WITHOUT CONTRAST CT CERVICAL SPINE WITHOUT CONTRAST TECHNIQUE: Multidetector CT imaging of the head and cervical spine was performed following the standard protocol without intravenous contrast. Multiplanar CT image reconstructions of the cervical spine were also generated. COMPARISON:  Brain and cervical spine CT 04/15/2018 FINDINGS: CT HEAD FINDINGS Brain: Unchanged bilateral chronic thalamic lacunar infarcts. Unchanged right basal ganglia lacunar infarcts. Unchanged left basal ganglia calcifications. No evidence for acute cortically based infarct, intracranial hemorrhage, mass lesion or mass-effect. Vascular: Unremarkable Skull: Intact. Sinuses/Orbits: Mucosal thickening involving the right maxillary sinus, ethmoid air cells. Mastoid air cells are unremarkable. Orbits are unremarkable. Other: None. CT CERVICAL SPINE FINDINGS Alignment: Multilevel mild anterolisthesis of the cervical spine C3-4 through C6-7, stable from prior and likely secondary to degenerative changes at the facet joints. Skull base and vertebrae: No acute fracture. No primary bone lesion or focal pathologic process. Incomplete C1 ring posteriorly. Soft tissues and spinal canal: No prevertebral fluid or swelling. No visible canal hematoma. Disc levels: Multilevel facet degenerative changes. No acute fracture. Upper chest: Unremarkable. Other: Similar-appearing 1.6 cm low-attenuation nodule left thyroid lobe. IMPRESSION: No acute intracranial process. Small chronic bilateral thalamic lacunar infarcts. No acute cervical spine fracture. Electronically Signed   By: Annia Belt M.D.   On:  06/20/2018 08:27   Ct Angio Chest Pe W And/or Wo Contrast  Result Date: 06/20/2018 CLINICAL DATA:  85 year old with a syncopal episode causing fall. Shortness of breath. EXAM: CT ANGIOGRAPHY CHEST WITH CONTRAST TECHNIQUE: Multidetector CT imaging of the chest was performed using the standard protocol during bolus administration of intravenous contrast. Multiplanar CT image reconstructions and MIPs were obtained to evaluate the vascular anatomy. CONTRAST:  68mL ISOVUE-370 IOPAMIDOL (ISOVUE-370) INJECTION 76% COMPARISON:  Chest radiograph 06/20/2018 and abdominal CT 09/07/2012 FINDINGS: Cardiovascular: Satisfactory opacification of the pulmonary arteries to the segmental level. No evidence of pulmonary embolism. Normal heart size. No pericardial effusion. Few atherosclerotic calcifications in the thoracic aorta without aortic enlargement. There are coronary artery calcifications, particularly in the LAD. Mediastinum/Nodes: Small hiatal hernia and similar to 2014. No significant lymph node enlargement in the mediastinum or hila. No axillary lymph node enlargement. Lungs/Pleura: Trachea and mainstem bronchi are patent. No pleural effusions. Poorly defined nodule along the posterior right lower lobe on sequence 6, image 74. This measures up to 7 mm based on the reformatted images. There was a similar nodular density in this area in 2014. This is most  compatible with a benign finding. Scattered areas of atelectasis or air trapping particularly in the right lower lobe. No significant lung consolidation or airspace disease. Upper Abdomen: Again noted is a 1.8 cm low attenuating structure in the left hepatic lobe and not significantly changed since 2014. Possible right kidney stone which is incompletely evaluated. Fatty infiltration in the pancreas. 9 mm density at the base of the gallbladder suggestive for a gallstone. No gallbladder distension. Soft tissue nodule medial to the spleen is most compatible with a splenule  and stable. No acute findings in the upper abdomen. Musculoskeletal: Multilevel degenerative changes in thoracic spine. No acute abnormality. Review of the MIP images confirms the above findings. IMPRESSION: 1. Negative for a pulmonary embolism. 2. No acute chest abnormality. 3. Patchy densities in the lungs are compatible with atelectasis and mild air trapping. No significant lung consolidation or airspace disease. 4. Stable incidental findings in the chest and abdomen as described. 5. Cholelithiasis. 6. Aortic Atherosclerosis (ICD10-I70.0). Coronary artery calcifications. 7. Possible right kidney stone. Electronically Signed   By: Richarda Overlie M.D.   On: 06/20/2018 08:34   Ct Cervical Spine Wo Contrast  Result Date: 06/20/2018 CLINICAL DATA:  Patient status post syncopal episode. EXAM: CT HEAD WITHOUT CONTRAST CT CERVICAL SPINE WITHOUT CONTRAST TECHNIQUE: Multidetector CT imaging of the head and cervical spine was performed following the standard protocol without intravenous contrast. Multiplanar CT image reconstructions of the cervical spine were also generated. COMPARISON:  Brain and cervical spine CT 04/15/2018 FINDINGS: CT HEAD FINDINGS Brain: Unchanged bilateral chronic thalamic lacunar infarcts. Unchanged right basal ganglia lacunar infarcts. Unchanged left basal ganglia calcifications. No evidence for acute cortically based infarct, intracranial hemorrhage, mass lesion or mass-effect. Vascular: Unremarkable Skull: Intact. Sinuses/Orbits: Mucosal thickening involving the right maxillary sinus, ethmoid air cells. Mastoid air cells are unremarkable. Orbits are unremarkable. Other: None. CT CERVICAL SPINE FINDINGS Alignment: Multilevel mild anterolisthesis of the cervical spine C3-4 through C6-7, stable from prior and likely secondary to degenerative changes at the facet joints. Skull base and vertebrae: No acute fracture. No primary bone lesion or focal pathologic process. Incomplete C1 ring posteriorly.  Soft tissues and spinal canal: No prevertebral fluid or swelling. No visible canal hematoma. Disc levels: Multilevel facet degenerative changes. No acute fracture. Upper chest: Unremarkable. Other: Similar-appearing 1.6 cm low-attenuation nodule left thyroid lobe. IMPRESSION: No acute intracranial process. Small chronic bilateral thalamic lacunar infarcts. No acute cervical spine fracture. Electronically Signed   By: Annia Belt M.D.   On: 06/20/2018 08:27   Ct Renal Stone Study  Result Date: 06/20/2018 CLINICAL DATA:  85 year old with hematuria, unknown cause. EXAM: CT ABDOMEN AND PELVIS WITHOUT CONTRAST TECHNIQUE: Multidetector CT imaging of the abdomen and pelvis was performed following the standard protocol without IV contrast. COMPARISON:  CT abdomen 09/07/2012.  Chest CT 06/20/2018 FINDINGS: Lower chest: Stable 7 mm nodular density in the right lower lobe on sequence 4, image 14 and no significant change since 2014. Few patchy densities in the right lower lobe are most compatible with atelectasis. No pleural effusions. Hepatobiliary: Chronic 1.6 cm low-density structure in left hepatic lobe probably represents a cyst and no significant change since 2014. Small stone in the gallbladder measuring roughly 8 mm. No evidence for gallbladder dilatation or inflammation. No biliary dilatation. Pancreas: Fatty infiltration of the pancreas without inflammation or duct dilatation. Spleen: Normal in size without focal abnormality. Adrenals/Urinary Tract: Normal adrenal glands. Limited evaluation for nonobstructive kidney stones due to the recent contrast administration from the chest CTA.  There is contrast in the renal collecting systems and urinary bladder. Urinary bladder is not distended but there are multiple small foci of contrast invaginating into the bladder wall and suggestive for bladder trabeculation. Proximal and distal right ureter is opacified with contrast. The mid right ureter does not have contrast.  Contrast in the left ureter without suspicious lesion. Negative for hydronephrosis or ureter dilatation. No suspicious renal lesions on this examination. Stomach/Bowel: Small hiatal hernia. Extensive diverticulosis in the sigmoid colon and distal descending colon without inflammatory changes. There appears to be in normal small appendix. No evidence for bowel dilatation or obstruction. Stomach is unremarkable. Vascular/Lymphatic: Atherosclerotic calcifications in the abdominal aorta without aneurysm. No lymph node enlargement in the abdomen or pelvis. Reproductive: Limited evaluation of the prostate due to streak artifact from the bilateral hip replacements. Other: Negative for free air. No free fluid. Small umbilical hernia containing fat. Musculoskeletal: Bilateral hip replacements are located. Extensive facet arthropathy in the lower lumbar spine. Multiple disc levels with vacuum disc phenomenon. IMPRESSION: 1. Limited evaluation for kidney stones due to the recent chest CTA and contrast administration. Recent chest CTA suggested there may be a right renal calculus but this could not be confirmed on this examination. Negative for hydronephrosis. No suspicious findings in the urinary tract. 2. Evidence for bladder trabeculation. Limited evaluation of the prostate. 3. Multilevel degenerative disease in the lumbar spine. 4. Cholelithiasis.  No biliary dilatation. Electronically Signed   By: Richarda Overlie M.D.   On: 06/20/2018 11:47    Micro Results    Recent Results (from the past 240 hour(s))  Blood culture (routine x 2)     Status: None   Collection Time: 06/20/18  6:30 AM  Result Value Ref Range Status   Specimen Description BLOOD RIGHT ANTECUBITAL  Final   Special Requests   Final    BOTTLES DRAWN AEROBIC AND ANAEROBIC Blood Culture adequate volume   Culture   Final    NO GROWTH 5 DAYS Performed at [facility], [address], [place], [place] [address]    Report Status 06/25/2018 FINAL   Final  Blood culture (routine x 2)     Status: None   Collection Time: 06/20/18  6:35 AM  Result Value Ref Range Status   Specimen Description BLOOD RIGHT HAND  Final   Special Requests   Final    BOTTLES DRAWN AEROBIC AND ANAEROBIC Blood Culture adequate volume   Culture   Final    NO GROWTH 5 DAYS Performed at [facility], [address], [place], [place] [address]    Report Status 06/25/2018 FINAL  Final       Today   Subjective:   Jadis Mika today has no headache,no chest abdominal pain,no new weakness tingling or numbness, feels much better  today.  Objective:   Blood pressure 115/73, pulse 89, temperature 98.3 F (36.8 C), temperature source Oral, resp. rate 20, height 5' (1.524 m), weight 70.1 kg, SpO2 97 %.   Intake/Output Summary (Last 24 hours) at 06/26/2018 1129 Last data filed at 06/26/2018 0657 Gross per 24 hour  Intake 580 ml  Output 1050 ml  Net -470 ml    Exam Awake Alert, Oriented x 3, No new F.N deficits, Normal affect Symmetrical Chest wall movement, Good air movement bilaterally, CTAB RRR,No Gallops,Rubs or new Murmurs, No Parasternal Heave +ve B.Sounds, Abd Soft, Non tender, No rebound -guarding or rigidity. No Cyanosis, Clubbing or edema, No new Rash or bruise  Data Review   CBC w Diff:  Lab Results  Component Value Date   WBC 7.6 06/22/2018   HGB 10.1 (L) 06/22/2018   HCT 31.0 (L) 06/22/2018   PLT 136 (L) 06/22/2018   LYMPHOPCT 5 06/20/2018   MONOPCT 12 06/20/2018   EOSPCT 0 06/20/2018   BASOPCT 0 06/20/2018    CMP:  Lab Results  Component Value Date   NA 136 06/24/2018   K 4.4 06/24/2018   CL 106 06/24/2018   CO2 20 (L) 06/24/2018   BUN 30 (H) 06/24/2018   CREATININE 1.01 06/24/2018   PROT 6.8 06/20/2018   ALBUMIN 3.7 06/20/2018   BILITOT 0.6 06/20/2018   ALKPHOS 66 06/20/2018   AST 33 06/20/2018   ALT 17 06/20/2018  .   Total Time in preparing paper work, data evaluation and todays exam - 35 minutes  Huey Bienen[place]awood  Nyeemah Jennette M.D on 06/26/2018 at 11:29 AM  [facility]   Office  [phone]

## 2018-06-26 NOTE — Consult Note (Signed)
Ref: Merri Brunette, MD   Subjective:  Chronic left sided chest pain at apex and persistent dry cough. Hyperglycemia persist.   Objective:  Vital Signs in the last 24 hours: Temp:  [98.3 F (36.8 C)-98.6 F (37 C)] 98.3 F (36.8 C) (01/03 0456) Pulse Rate:  [75-89] 89 (01/03 0855) Cardiac Rhythm: Normal sinus rhythm (01/03 0700) Resp:  [18-21] 20 (01/02 2052) BP: (94-138)/(53-81) 115/73 (01/03 0855) SpO2:  [89 %-96 %] 93 % (01/03 0456) Weight:  [70.1 kg] 70.1 kg (01/03 0456)  Physical Exam: BP Readings from Last 1 Encounters:  06/26/18 115/73     Wt Readings from Last 1 Encounters:  06/26/18 70.1 kg    Weight change: -3.719 kg Body mass index is 30.17 kg/m. HEENT: [place]/AT, Eyes-Blue, PERL, EOMI, Conjunctiva-Pale pink, Sclera-Non-icteric Neck: No JVD, No bruit, Trachea midline. Lungs:  Clearing, Bilateral. Cardiac:  Regular rhythm, normal S1 and S2, no S3. II/VI systolic murmur. Abdomen:  Soft, non-tender. BS present. Extremities:  No edema present. No cyanosis. No clubbing. CNS: AxOx3, Cranial nerves grossly intact, moves all 4 extremities.  Skin: Warm and dry.   Intake/Output from previous day: 01/02 0701 - 01/03 0700 In: 820 [P.O.:820] Out: 1250 [Urine:1250]    Lab Results: BMET    Component Value Date/Time   NA 136 06/24/2018 0434   NA 132 (L) 06/22/2018 0354   NA 136 06/21/2018 0313   K 4.4 06/24/2018 0434   K 4.1 06/22/2018 0354   K 3.3 (L) 06/21/2018 0313   CL 106 06/24/2018 0434   CL 102 06/22/2018 0354   CL 105 06/21/2018 0313   CO2 20 (L) 06/24/2018 0434   CO2 15 (L) 06/22/2018 0354   CO2 19 (L) 06/21/2018 0313   GLUCOSE 289 (H) 06/24/2018 0434   GLUCOSE 265 (H) 06/22/2018 0354   GLUCOSE 248 (H) 06/21/2018 0313   BUN 30 (H) 06/24/2018 0434   BUN 18 06/22/2018 0354   BUN 18 06/21/2018 0313   CREATININE 1.01 06/24/2018 0434   CREATININE 1.15 06/22/2018 0354   CREATININE 1.12 06/21/2018 0313   CALCIUM 7.3 (L) 06/24/2018 0434   CALCIUM 7.1 (L)  06/22/2018 0354   CALCIUM 7.3 (L) 06/21/2018 0313   GFRNONAA >60 06/24/2018 0434   GFRNONAA 59 (L) 06/22/2018 0354   GFRNONAA >60 06/21/2018 0313   GFRAA >60 06/24/2018 0434   GFRAA >60 06/22/2018 0354   GFRAA >60 06/21/2018 0313   CBC    Component Value Date/Time   WBC 7.6 06/22/2018 0354   RBC 3.80 (L) 06/22/2018 0354   HGB 10.1 (L) 06/22/2018 0354   HCT 31.0 (L) 06/22/2018 0354   PLT 136 (L) 06/22/2018 0354   MCV 81.6 06/22/2018 0354   MCH 26.6 06/22/2018 0354   MCHC 32.6 06/22/2018 0354   RDW 17.4 (H) 06/22/2018 0354   LYMPHSABS 0.6 (L) 06/20/2018 0600   MONOABS 1.4 (H) 06/20/2018 0600   EOSABS 0.0 06/20/2018 0600   BASOSABS 0.0 06/20/2018 0600   HEPATIC Function Panel Recent Labs    06/20/18 0600  PROT 6.8   HEMOGLOBIN A1C No components found for: HGA1C,  MPG CARDIAC ENZYMES Lab Results  Component Value Date   TROPONINI 2.10 (HH) 06/21/2018   TROPONINI 2.76 (HH) 06/20/2018   TROPONINI 2.72 (HH) 06/20/2018   BNP No results for input(s): PROBNP in the last 8760 hours. TSH No results for input(s): TSH in the last 8760 hours. CHOLESTEROL Recent Labs    06/26/18 0439  CHOL 153    Scheduled Meds: .  alfuzosin  10 mg Oral Q breakfast  . aspirin EC  81 mg Oral Daily  . atorvastatin  20 mg Oral q1800  . brimonidine  1 drop Both Eyes BID  . carvedilol  6.25 mg Oral BID WC  . doxycycline  100 mg Oral Q12H  . ferrous sulfate  325 mg Oral Daily  . furosemide  40 mg Oral Daily  . guaiFENesin  600 mg Oral BID  . insulin aspart  0-15 Units Subcutaneous TID WC  . insulin aspart  0-5 Units Subcutaneous QHS  . insulin glargine  10 Units Subcutaneous Daily  . insulin glargine  15 Units Subcutaneous QHS  . insulin glargine  5 Units Subcutaneous Once  . ipratropium-albuterol  3 mL Nebulization BID  . Melatonin  3 tablet Oral QHS  . mirabegron ER  50 mg Oral Daily  . pantoprazole  40 mg Oral Daily  . potassium chloride  10 mEq Oral Daily  . predniSONE  10 mg Oral  Q breakfast  . sacubitril-valsartan  1 tablet Oral BID  . sodium chloride flush  3 mL Intravenous Q12H  . timolol  1 drop Both Eyes BID  . traZODone  50 mg Oral QHS   Continuous Infusions: PRN Meds:.acetaminophen, benzonatate, chlorpheniramine-HYDROcodone, ondansetron **OR** ondansetron (ZOFRAN) IV, oxyCODONE  Assessment/Plan: NSTEMI Near syncope Ischemic cardiomyopathy CAD Uncontrolled type 2 DM Acute bronchitis  Continue medical treatment. Increase Atorvastatin to 40 mg. Daily.   LOS: 6 days    Orpah CobbAjay Freada Twersky  MD  06/26/2018, 9:46 AM

## 2018-06-26 NOTE — Social Work (Signed)
[facility] Updated MD. CSW to follow for medical readiness and support with discharge planning.  Abigail ButtsSusan Brainard Highfill, LCSW [phone]

## 2018-06-26 NOTE — Clinical Social Work Placement (Signed)
   CLINICAL SOCIAL WORK PLACEMENT  NOTE  Date:  06/26/2018  Patient Details  Name: Matthew Calderon MRN: [id number] Date of Birth: 07/09/1932  Clinical Social Work is seeking post-discharge placement for this patient at the Skilled  Nursing Facility level of care (*CSW will initial, date and re-position this form in  chart as items are completed):  Yes   Patient/family provided with [facility] Clinical Social Work Department's list of facilities offering this level of care within the geographic area requested by the patient (or if unable, by the patient's family).  Yes   Patient/family informed of their freedom to choose among providers that offer the needed level of care, that participate in Medicare, Medicaid or managed care program needed by the patient, have an available bed and are willing to accept the patient.  Yes   Patient/family informed of [facility]'s ownership interest in [facility] and [facility], as well as of the fact that they are under no obligation to receive care at these facilities.  PASRR submitted to EDS on       PASRR number received on       Existing PASRR number confirmed on 06/23/18     FL2 transmitted to all facilities in geographic area requested by pt/family on 06/23/18     FL2 transmitted to all facilities within larger geographic area on       Patient informed that his/her managed care company has contracts with or will negotiate with certain facilities, including the following:  [facility]     Yes   Patient/family informed of bed offers received.  Patient chooses bed at [facility]     Physician recommends and patient chooses bed at      Patient to be transferred to [facility] on 06/26/18.  Patient to be transferred to facility by [facility]     Patient family notified on 06/26/18 of transfer.  Name of family member notified:        PHYSICIAN       Additional Comment:    _______________________________________________ Abigail Butts,  LCSW 06/26/2018, 12:09 PM

## 2018-06-26 NOTE — Discharge Instructions (Signed)
Follow with Primary MD Merri Brunette, MD or SNF physician   Get CBC, CMP,  checked  by Primary MD next visit.    Activity: As tolerated with Full fall precautions use walker/cane & assistance as needed   Disposition SNF   Diet: Heart Healthy / carb modified, with feeding assistance and aspiration precautions.  For Heart failure patients - Check your Weight same time everyday, if you gain over 2 pounds, or you develop in leg swelling, experience more shortness of breath or chest pain, call your Primary MD immediately. Follow Cardiac Low Salt Diet and 1.5 lit/day fluid restriction.   On your next visit with your primary care physician please Get Medicines reviewed and adjusted.   Please request your Prim.MD to go over all Hospital Tests and Procedure/Radiological results at the follow up, please get all Hospital records sent to your Prim MD by signing hospital release before you go home.   If you experience worsening of your admission symptoms, develop shortness of breath, life threatening emergency, suicidal or homicidal thoughts you must seek medical attention immediately by calling 911 or calling your MD immediately  if symptoms less severe.  You Must read complete instructions/literature along with all the possible adverse reactions/side effects for all the Medicines you take and that have been prescribed to you. Take any new Medicines after you have completely understood and accpet all the possible adverse reactions/side effects.   Do not drive, operating heavy machinery, perform activities at heights, swimming or participation in water activities or provide baby sitting services if your were admitted for syncope or siezures until you have seen by Primary MD or a Neurologist and advised to do so again.  Do not drive when taking Pain medications.    Do not take more than prescribed Pain, Sleep and Anxiety Medications  Special Instructions: If you have smoked or chewed Tobacco  in  the last 2 yrs please stop smoking, stop any regular Alcohol  and or any Recreational drug use.  Wear Seat belts while driving.   Please note  You were cared for by a hospitalist during your hospital stay. If you have any questions about your discharge medications or the care you received while you were in the hospital after you are discharged, you can call the unit and asked to speak with the hospitalist on call if the hospitalist that took care of you is not available. Once you are discharged, your primary care physician will handle any further medical issues. Please note that NO REFILLS for any discharge medications will be authorized once you are discharged, as it is imperative that you return to your primary care physician (or establish a relationship with a primary care physician if you do not have one) for your aftercare needs so that they can reassess your need for medications and monitor your lab values.

## 2018-06-26 NOTE — Social Work (Signed)
Patient will discharge to [facility] SN[facility] Anticipated discharge date: 06/26/18 Family notified: Armstead Peaks, daughter Transportation by: Sharin Mons  Nurse to call report to [phone].  CSW signing off.  Abigail Butts, LCSWA  Clinical Social Worker

## 2018-07-21 ENCOUNTER — Ambulatory Visit: Payer: Medicare Other | Admitting: Pulmonary Disease

## 2018-07-21 ENCOUNTER — Encounter: Payer: Self-pay | Admitting: Pulmonary Disease

## 2018-07-21 VITALS — BP 114/72 | HR 78 | Ht 60.0 in | Wt 158.6 lb

## 2018-07-21 DIAGNOSIS — R053 Chronic cough: Secondary | ICD-10-CM | POA: Insufficient documentation

## 2018-07-21 DIAGNOSIS — R05 Cough: Secondary | ICD-10-CM

## 2018-07-21 DIAGNOSIS — K219 Gastro-esophageal reflux disease without esophagitis: Secondary | ICD-10-CM

## 2018-07-21 NOTE — Patient Instructions (Signed)
Cough may be related to postnasal drip or reflux. Matthew Calderon may make this worse although it is excellent medicine for your heart  Lung function is good, you do not need breathing inhalers.  Suggest continue on Protonix for another 6 weeks Discontinue Mucinex. Okay to use Zyrtec on a daily basis to dry up your sinuses

## 2018-07-21 NOTE — Assessment & Plan Note (Signed)
Trial of Protonix for 4 to 6 weeks

## 2018-07-21 NOTE — Progress Notes (Signed)
Subjective:    Patient ID: Matthew Calderon, male    DOB: 02-04-1933, 85 y.o.   MRN: [id number]  HPI  Chief Complaint  Patient presents with  . pulm consult    Pt was referred by Dr. Renne CriglerPharr MD for chronic cough. Pt has productive cough-clear, and pt stays tired.     85 year old remote smoker, [facility] presents for evaluation of chronic cough. He reports a cough ongoing for about 6 months which she calls is a small cough that is holding on and is a nuisance.  He has been given Mucinex and at one time also a codeine cough syrup which he used at night with good relief.  He now is on over-the-counter [place] DM for use as needed. He was hospitalized 12/28 to 1/3 after a fall/syncope and was found to have a non-STEMI, CBG was 462.  Echo surprisingly showed an EF of 25%, he was seen by cardiologist and placed on Entresto and medical management recommended. He was also felt to have an acute bronchitis and was treated with doxycycline and Solu-Medrol.  He appears to have recovered well and had his follow-up cardiology appointment today.  He denies pedal edema and is not on diuretics. He reports occasional nasal drip and nasal congestion and throat irritation.  He reports occasional heartburn or reflux He smoked less than 10 pack years before he quit in his early 5730s. He is a retired Art gallery managerengineer  I have reviewed his hospital record and nursing home record and PCP Dr. Dorthula NettlesFarr's notes.  Metformin has been increased and has sugars are in better control  Significant tests/ events reviewed  CT angiogram obtained during this admission does not show any evidence of interstitial lung disease, infiltrates or effusions. Head CT shows mild thickening of right maxillary sinus and ethmoid sinuses  Chest x-ray obtained 06/27/2018 after discharge also appears to be clear Spirometry obtained today does not show any evidence of airway obstruction with ratio of 87    Past Medical History:    Diagnosis Date  . Anemia    takes iron pill  . Anxiety   . Arthritis   . Depression   . Diabetes mellitus without complication (HCC)   . GERD (gastroesophageal reflux disease)    pepto  . H/O hiatal hernia   . Pneumonia    4 years ago  . Post-nasal drip   . Prostate hyperplasia, benign localized, without urinary obstruction   . Seasonal allergies   . Sleep apnea    no CPAP     Past Surgical History:  Procedure Laterality Date  . BACK SURGERY  1995   discectomy  . COLONOSCOPY W/ BIOPSIES AND POLYPECTOMY    . EYE SURGERY Bilateral    cataracts  . EYE SURGERY Bilateral    glaucoma shunts  . JOINT REPLACEMENT Left 1992   knee  . JOINT REPLACEMENT Right 1996   knee  . JOINT REPLACEMENT Right 2009   hip  . JOINT REPLACEMENT Left 2005   hip  . KNEE ARTHROSCOPY Left 1981  . KNEE ARTHROSCOPY WITH PATELLA RECONSTRUCTION Right 2010  . KNEE SURGERY Bilateral 1954   knee surgery and placed in a cast for 6 weeks  . KNEE SURGERY  1961   cartilage removed  . LEG SURGERY Bilateral 1974, 1975   straighten legs  . SHOULDER OPEN ROTATOR CUFF REPAIR Right 2010  . TONSILLECTOMY    . TOTAL HIP ARTHROPLASTY Right 01/13/2014   Procedure: TOTAL HIP ARTHROPLASTY  ANTERIOR APPROACH;  Surgeon: Velna Ochs, MD;  Location: [facility] OR;  Service: Orthopedics;  Laterality: Right;    Allergies  Allergen Reactions  . Penicillins Hives    DID THE REACTION INVOLVE: Swelling of the face/tongue/throat, SOB, or low BP? No Sudden or severe rash/hives, skin peeling, or the inside of the mouth or nose? No Did it require medical treatment? No When did it last happen?childhood allergy If all above answers are "NO", may proceed with cephalosporin use.   . Sulfa Antibiotics Hives    Social History   Socioeconomic History  . Marital status: Married    Spouse name: Not on file  . Number of children: Not on file  . Years of education: Not on file  . Highest education level: Not on file   Occupational History  . Not on file  Social Needs  . Financial resource strain: Not on file  . Food insecurity:    Worry: Not on file    Inability: Not on file  . Transportation needs:    Medical: Not on file    Non-medical: Not on file  Tobacco Use  . Smoking status: Former Smoker    Packs/day: 1.00    Years: 18.00    Pack years: 18.00    Types: Cigarettes    Last attempt to quit: 1978    Years since quitting: 42.1  . Smokeless tobacco: Never Used  . Tobacco comment: stopped 45 years ago  Substance and Sexual Activity  . Alcohol use: Yes    Comment: social  . Drug use: No  . Sexual activity: Not on file  Lifestyle  . Physical activity:    Days per week: Not on file    Minutes per session: Not on file  . Stress: Not on file  Relationships  . Social connections:    Talks on phone: Not on file    Gets together: Not on file    Attends religious service: Not on file    Active member of club or organization: Not on file    Attends meetings of clubs or organizations: Not on file    Relationship status: Not on file  . Intimate partner violence:    Fear of current or ex partner: Not on file    Emotionally abused: Not on file    Physically abused: Not on file    Forced sexual activity: Not on file  Other Topics Concern  . Not on file  Social History Narrative  . Not on file     Family History  Problem Relation Age of Onset  . Arthritis Mother   . Arthritis Father      Review of Systems  Constitutional: Positive for appetite change and fatigue. Negative for fever and unexpected weight change.  HENT: Positive for congestion, postnasal drip, sinus pressure and sneezing. Negative for dental problem, ear pain, nosebleeds, rhinorrhea, sore throat and trouble swallowing.   Eyes: Negative for redness and itching.  Respiratory: Positive for cough and shortness of breath. Negative for chest tightness and wheezing.   Cardiovascular: Positive for leg swelling. Negative for  palpitations.  Gastrointestinal: Negative for nausea and vomiting.  Genitourinary: Negative for dysuria.  Musculoskeletal: Negative for joint swelling.  Skin: Negative for rash.  Allergic/Immunologic: Negative.  Negative for environmental allergies, food allergies and immunocompromised state.  Neurological: Negative for headaches.  Hematological: Bruises/bleeds easily.  Psychiatric/Behavioral: Negative for dysphoric mood. The patient is nervous/anxious.        Objective:   Physical Exam  Gen. Pleasant, well-nourished,  Short,in no distress, normal affect ENT - no pallor,icterus, no post nasal drip Neck: No JVD, no thyromegaly, no carotid bruits Lungs: no use of accessory muscles, no dullness to percussion, RT basal  rales no rhonchi  Cardiovascular: Rhythm regular, heart sounds  normal, no murmurs or gallops, no peripheral edema Abdomen: soft and non-tender, no hepatosplenomegaly, BS normal. Musculoskeletal: No deformities, no cyanosis or clubbing Neuro:  alert, non focal       Assessment & Plan:

## 2018-07-21 NOTE — Assessment & Plan Note (Signed)
Cough may be related to postnasal drip or reflux. Sherryll Burger may make this worse although it is excellent medicine for your heart  Lung function is good, does not need breathing inhalers.  Suggest continue on Protonix for another 6 weeks Discontinue Mucinex. Okay to use Zyrtec on a daily basis to dry up your sinuses, will avoid decongestants due to heart condition

## 2019-05-04 ENCOUNTER — Ambulatory Visit: Payer: Self-pay | Admitting: Podiatry

## 2019-05-14 ENCOUNTER — Ambulatory Visit: Payer: Medicare Other | Admitting: Podiatry

## 2019-05-14 ENCOUNTER — Other Ambulatory Visit: Payer: Self-pay

## 2019-05-14 ENCOUNTER — Encounter: Payer: Self-pay | Admitting: Podiatry

## 2019-05-14 VITALS — BP 92/47 | HR 67

## 2019-05-14 DIAGNOSIS — M1990 Unspecified osteoarthritis, unspecified site: Secondary | ICD-10-CM | POA: Insufficient documentation

## 2019-05-14 DIAGNOSIS — L600 Ingrowing nail: Secondary | ICD-10-CM

## 2019-05-14 DIAGNOSIS — N3010 Interstitial cystitis (chronic) without hematuria: Secondary | ICD-10-CM | POA: Insufficient documentation

## 2019-05-14 DIAGNOSIS — N400 Enlarged prostate without lower urinary tract symptoms: Secondary | ICD-10-CM | POA: Insufficient documentation

## 2019-05-14 DIAGNOSIS — Z9889 Other specified postprocedural states: Secondary | ICD-10-CM | POA: Insufficient documentation

## 2019-05-14 DIAGNOSIS — E119 Type 2 diabetes mellitus without complications: Secondary | ICD-10-CM | POA: Diagnosis not present

## 2019-05-14 DIAGNOSIS — R0982 Postnasal drip: Secondary | ICD-10-CM | POA: Insufficient documentation

## 2019-05-14 DIAGNOSIS — B351 Tinea unguium: Secondary | ICD-10-CM | POA: Diagnosis not present

## 2019-05-14 DIAGNOSIS — M79674 Pain in right toe(s): Secondary | ICD-10-CM | POA: Diagnosis not present

## 2019-05-14 DIAGNOSIS — M79675 Pain in left toe(s): Secondary | ICD-10-CM | POA: Diagnosis not present

## 2019-05-14 DIAGNOSIS — G4733 Obstructive sleep apnea (adult) (pediatric): Secondary | ICD-10-CM | POA: Insufficient documentation

## 2019-05-14 DIAGNOSIS — E669 Obesity, unspecified: Secondary | ICD-10-CM | POA: Insufficient documentation

## 2019-05-14 DIAGNOSIS — Z794 Long term (current) use of insulin: Secondary | ICD-10-CM

## 2019-05-14 DIAGNOSIS — J302 Other seasonal allergic rhinitis: Secondary | ICD-10-CM | POA: Insufficient documentation

## 2019-05-14 DIAGNOSIS — R112 Nausea with vomiting, unspecified: Secondary | ICD-10-CM | POA: Insufficient documentation

## 2019-05-14 DIAGNOSIS — Z87891 Personal history of nicotine dependence: Secondary | ICD-10-CM | POA: Insufficient documentation

## 2019-05-14 HISTORY — DX: Nausea with vomiting, unspecified: R11.2

## 2019-05-14 HISTORY — DX: Other specified postprocedural states: Z98.890

## 2019-05-14 NOTE — Patient Instructions (Signed)
Diabetes Mellitus and Foot Care Foot care is an important part of your health, especially when you have diabetes. Diabetes may cause you to have problems because of poor blood flow (circulation) to your feet and legs, which can cause your skin to:  Become thinner and drier.  Break more easily.  Heal more slowly.  Peel and crack. You may also have nerve damage (neuropathy) in your legs and feet, causing decreased feeling in them. This means that you may not notice minor injuries to your feet that could lead to more serious problems. Noticing and addressing any potential problems early is the best way to prevent future foot problems. How to care for your feet Foot hygiene  Wash your feet daily with warm water and mild soap. Do not use hot water. Then, pat your feet and the areas between your toes until they are completely dry. Do not soak your feet as this can dry your skin.  Trim your toenails straight across. Do not dig under them or around the cuticle. File the edges of your nails with an emery board or nail file.  Apply a moisturizing lotion or petroleum jelly to the skin on your feet and to dry, brittle toenails. Use lotion that does not contain alcohol and is unscented. Do not apply lotion between your toes. Shoes and socks  Wear clean socks or stockings every day. Make sure they are not too tight. Do not wear knee-high stockings since they may decrease blood flow to your legs.  Wear shoes that fit properly and have enough cushioning. Always look in your shoes before you put them on to be sure there are no objects inside.  To break in new shoes, wear them for just a few hours a day. This prevents injuries on your feet. Wounds, scrapes, corns, and calluses  Check your feet daily for blisters, cuts, bruises, sores, and redness. If you cannot see the bottom of your feet, use a mirror or ask someone for help.  Do not cut corns or calluses or try to remove them with medicine.  If you  find a minor scrape, cut, or break in the skin on your feet, keep it and the skin around it clean and dry. You may clean these areas with mild soap and water. Do not clean the area with peroxide, alcohol, or iodine.  If you have a wound, scrape, corn, or callus on your foot, look at it several times a day to make sure it is healing and not infected. Check for: ? Redness, swelling, or pain. ? Fluid or blood. ? Warmth. ? Pus or a bad smell. General instructions  Do not cross your legs. This may decrease blood flow to your feet.  Do not use heating pads or hot water bottles on your feet. They may burn your skin. If you have lost feeling in your feet or legs, you may not know this is happening until it is too late.  Protect your feet from hot and cold by wearing shoes, such as at the beach or on hot pavement.  Schedule a complete foot exam at least once a year (annually) or more often if you have foot problems. If you have foot problems, report any cuts, sores, or bruises to your health care provider immediately. Contact a health care provider if:  You have a medical condition that increases your risk of infection and you have any cuts, sores, or bruises on your feet.  You have an injury that is not   healing.  You have redness on your legs or feet.  You feel burning or tingling in your legs or feet.  You have pain or cramps in your legs and feet.  Your legs or feet are numb.  Your feet always feel cold.  You have pain around a toenail. Get help right away if:  You have a wound, scrape, corn, or callus on your foot and: ? You have pain, swelling, or redness that gets worse. ? You have fluid or blood coming from the wound, scrape, corn, or callus. ? Your wound, scrape, corn, or callus feels warm to the touch. ? You have pus or a bad smell coming from the wound, scrape, corn, or callus. ? You have a fever. ? You have a red line going up your leg. Summary  Check your feet every day  for cuts, sores, red spots, swelling, and blisters.  Moisturize feet and legs daily.  Wear shoes that fit properly and have enough cushioning.  If you have foot problems, report any cuts, sores, or bruises to your health care provider immediately.  Schedule a complete foot exam at least once a year (annually) or more often if you have foot problems. This information is not intended to replace advice given to you by your health care provider. Make sure you discuss any questions you have with your health care provider. Document Released: 06/07/2000 Document Revised: 07/23/2017 Document Reviewed: 07/12/2016 Elsevier Patient Education  2020 [employer]  

## 2019-05-22 NOTE — Progress Notes (Signed)
Subjective: Matthew Calderon presents today referred by Merri BrunettePharr, Walter, MD for diabetic foot evaluation.  Patient relates 20 year history of diabetes.  Patient denies any history of foot wounds.  Patient denies any history of numbness, tingling, burning, pins/needles sensations.  Today, patient c/o of painful, discolored, thick toenails which interfere with daily activities.  Pain is aggravated when wearing enclosed shoe gear.   This is his first time see a Podiatrist.  Past Medical History:  Diagnosis Date  . Anemia    takes iron pill  . Anxiety   . Arthritis   . Depression   . Diabetes mellitus without complication (HCC)   . GERD (gastroesophageal reflux disease)    pepto  . H/O hiatal hernia   . Pneumonia    4 years ago  . PONV (postoperative nausea and vomiting) 05/14/2019  . Post-nasal drip   . Prostate hyperplasia, benign localized, without urinary obstruction   . Seasonal allergies   . Sleep apnea    no CPAP    Patient Active Problem List   Diagnosis Date Noted  . Arthritis 05/14/2019  . Enlarged prostate 05/14/2019  . History of tobacco abuse 05/14/2019  . Interstitial cystitis 05/14/2019  . Obesity 05/14/2019  . OSA (obstructive sleep apnea) 05/14/2019  . PONV (postoperative nausea and vomiting) 05/14/2019  . Post-nasal drip 05/14/2019  . Seasonal allergies 05/14/2019  . Chronic cough 07/21/2018  . GERD (gastroesophageal reflux disease) 07/21/2018  . NSTEMI (non-ST elevated myocardial infarction) (HCC) 06/20/2018  . Prostate hyperplasia, benign localized, without urinary obstruction   . Hyperglycemia   . Syncope, cardiogenic   . Degenerative joint disease (DJD) of hip 01/13/2014  . Diabetes (HCC) 01/13/2014  . Pseudophakia, both eyes 12/01/2013  . Nonexudative age-related macular degeneration 01/16/2013  . Primary open angle glaucoma of right eye, severe stage 01/13/2013  . Allergic rhinitis 09/17/2010  . Anemia, unspecified 09/17/2010  .  Gastro-esophageal reflux disease with esophagitis 12/27/2009  . Other and unspecified hyperlipidemia 12/27/2009  . Hypertrophy of prostate without urinary obstruction and other lower urinary tract symptoms (LUTS) 07/11/2009  . Insomnia 07/11/2009  . Osteoporosis 07/11/2009  . Other malaise and fatigue 06/15/2008  . Depressive disorder, not elsewhere classified 05/04/2008  . Unspecified glaucoma 05/04/2008    Past Surgical History:  Procedure Laterality Date  . BACK SURGERY  1995   discectomy  . COLONOSCOPY W/ BIOPSIES AND POLYPECTOMY    . EYE SURGERY Bilateral    cataracts  . EYE SURGERY Bilateral    glaucoma shunts  . JOINT REPLACEMENT Left 1992   knee  . JOINT REPLACEMENT Right 1996   knee  . JOINT REPLACEMENT Right 2009   hip  . JOINT REPLACEMENT Left 2005   hip  . KNEE ARTHROSCOPY Left 1981  . KNEE ARTHROSCOPY WITH PATELLA RECONSTRUCTION Right 2010  . KNEE SURGERY Bilateral 1954   knee surgery and placed in a cast for 6 weeks  . KNEE SURGERY  1961   cartilage removed  . LEG SURGERY Bilateral 1974, 1975   straighten legs  . SHOULDER OPEN ROTATOR CUFF REPAIR Right 2010  . TONSILLECTOMY    . TOTAL HIP ARTHROPLASTY Right 01/13/2014   Procedure: TOTAL HIP ARTHROPLASTY ANTERIOR APPROACH;  Surgeon: Velna OchsPeter G Dalldorf, MD;  Location: [facility] OR;  Service: Orthopedics;  Laterality: Right;    Current Outpatient Medications on File Prior to Visit  Medication Sig Dispense Refill  . brimonidine (ALPHAGAN) 0.2 % ophthalmic solution Place 1 drop into both eyes 2 times daily.    .[name]  acetaminophen (TYLENOL) 325 MG tablet Take 2 tablets (650 mg total) by mouth every 6 (six) hours as needed for mild pain or fever.    . Aspirin Buf,CaCarb-MgCarb-MgO, 81 MG TABS Take by mouth.    [name] aspirin EC 81 MG EC tablet Take 1 tablet (81 mg total) by mouth daily.    [name] atorvastatin (LIPITOR) 40 MG tablet Take 1 tablet (40 mg total) by mouth daily at 6 PM.    . brimonidine (ALPHAGAN) 0.2 % ophthalmic solution  Place 1 drop into both eyes 2 (two) times daily.    . carvedilol (COREG) 12.5 MG tablet Take 1 tablet (12.5 mg total) by mouth 2 (two) times daily with a meal.    . Cholecalciferol 25 MCG (1000 UT) tablet Take 2,000 Units by mouth every morning.     . Ferrous Sulfate 143 (45 FE) MG TBCR Take 1 tablet by mouth daily.    . finasteride (PROSCAR) 5 MG tablet Take 5 mg by mouth daily.    . furosemide (LASIX) 40 MG tablet Take 1 tablet (40 mg total) by mouth daily. 30 tablet   . guaiFENesin (MUCINEX) 600 MG 12 hr tablet Take 1 tablet (600 mg total) by mouth 2 (two) times daily.    . insulin aspart (NOVOLOG) 100 UNIT/ML injection Inject 0-15 Units into the skin 3 (three) times daily with meals. 10 mL 11  . insulin glargine (LANTUS) 100 UNIT/ML injection Inject 0.12 mLs (12 Units total) into the skin daily. 10 mL 11  . Melatonin 10 MG TBCR Take 1 tablet by mouth at bedtime.    . metFORMIN (GLUCOPHAGE-XR) 500 MG 24 hr tablet Take 1,500 mg by mouth daily with breakfast.    . methocarbamol (ROBAXIN) 500 MG tablet Take 1 tablet (500 mg total) by mouth every 6 (six) hours as needed for muscle spasms. (Patient not taking: Reported on 06/20/2018) 50 tablet 0  . Multiple Vitamins-Minerals (PRESERVISION AREDS 2) CAPS Take 2 capsules by mouth 2 (two) times daily.    [name] MYRBETRIQ 50 MG TB24 tablet Take 50 mg by mouth daily.    [name] nystatin (MYCOSTATIN/NYSTOP) powder Apply 1 Bottle topically 2 (two) times daily.    . pantoprazole (PROTONIX) 40 MG tablet Take 1 tablet (40 mg total) by mouth daily.    . potassium chloride (K-DUR,KLOR-CON) 10 MEQ tablet Take 1 tablet (10 mEq total) by mouth daily.    . sacubitril-valsartan (ENTRESTO) 24-26 MG Take 1 tablet by mouth 2 (two) times daily. 60 tablet   . SYNJARDY XR 25-1000 MG TB24     . tamsulosin (FLOMAX) 0.4 MG CAPS capsule Take 0.4 mg by mouth daily.    . timolol (TIMOPTIC) 0.5 % ophthalmic solution Place 1 drop into both eyes 2 (two) times daily.    . traZODone (DESYREL)  50 MG tablet Take 50 mg by mouth at bedtime.     No current facility-administered medications on file prior to visit.      Allergies  Allergen Reactions  . Penicillins Hives    DID THE REACTION INVOLVE: Swelling of the face/tongue/throat, SOB, or low BP? No Sudden or severe rash/hives, skin peeling, or the inside of the mouth or nose? No Did it require medical treatment? No When did it last happen?childhood allergy If all above answers are "NO", may proceed with cephalosporin use.   . Sulfa Antibiotics Hives    Social History   Occupational History  . Not on file  Tobacco Use  . Smoking status: Former Smoker  Packs/day: 1.00    Years: 18.00    Pack years: 18.00    Types: Cigarettes    Quit date: 27    Years since quitting: 42.9  . Smokeless tobacco: Never Used  . Tobacco comment: stopped 45 years ago  Substance and Sexual Activity  . Alcohol use: Yes    Comment: social  . Drug use: No  . Sexual activity: Not on file    Family History  Problem Relation Age of Onset  . Arthritis Mother   . Arthritis Father     Immunization History  Administered Date(s) Administered  . Influenza, High Dose Seasonal PF 04/15/2018  . Tdap 04/15/2018   Review of systems: Positive Findings in bold print.  Constitutional:  chills, fatigue, fever, sweats, weight change Communication: Nurse, learning disability, sign Presenter, broadcasting, hand writing, iPad/Android device Head: headaches, head injury Eyes: changes in vision, eye pain, glaucoma, cataracts, macular degeneration, diplopia, glare,  light sensitivity, eyeglasses or contacts, blindness Ears nose mouth throat: hearing impaired, hearing aids,  ringing in ears, deaf, sign language,  vertigo, nosebleeds,  rhinitis,  cold sores, snoring, swollen glands Cardiovascular: HTN, edema, arrhythmia, pacemaker in place, defibrillator in place, chest pain/tightness, chronic anticoagulation, blood clot, heart failure, MI Peripheral Vascular: leg  cramps, varicose veins, blood clots, lymphedema, varicosities Respiratory:  difficulty breathing, denies congestion, SOB, wheezing, cough, emphysema Gastrointestinal: change in appetite or weight, abdominal pain, constipation, diarrhea, nausea, vomiting, vomiting blood, change in bowel habits, abdominal pain, jaundice, rectal bleeding, hemorrhoids, GERD Genitourinary:  nocturia,  pain on urination, polyuria,  blood in urine, Foley catheter, urinary urgency, ESRD on hemodialysis Musculoskeletal: amputation, cramping, stiff joints, painful joints, decreased joint motion, fractures, OA, gout, hemiplegia, paraplegia, uses cane, wheelchair bound, uses walker, uses rollator Skin: +changes in toenails, color change, dryness, itching, mole changes,  rash, wound(s) Neurological: headaches, numbness in feet, paresthesias in feet, burning in feet, fainting,  seizures, change in speech,  headaches, memory problems/poor historian, cerebral palsy, weakness, paralysis, CVA, TIA Endocrine: diabetes, hypothyroidism, hyperthyroidism,  goiter, dry mouth, flushing, heat intolerance, cold intolerance,  excessive thirst, denies polyuria,  nocturia Hematological:  easy bleeding, excessive bleeding, easy bruising, enlarged lymph nodes, on long term blood thinner, history of past transusions Allergy/immunological:  hives, eczema, frequent infections, multiple drug allergies, seasonal allergies, transplant recipient, multiple food allergies Psychiatric:  anxiety, depression, mood disorder, suicidal ideations, hallucinations, insomnia  Objective: Vitals:   05/14/19 1052  BP: (!) 92/47  Pulse: 67   Vascular Examination: Capillary refill time to digits <3 seconds b/l.  Dorsalis pedis pulses faintly palpable b/l.   Posterior tibial pulses palpable b/l.  Digital hair sparse b/l.   Skin temperature gradient WNL b/l.  Dermatological Examination: Skin thin, shiny and atrophic b/l.  Toenails 1-5 b/l discolored, thick,  dystrophic with subungual debris and pain with palpation to nailbeds due to thickness of nails.   Incurvated nailplate right great toe medial border with tenderness to palpation. No erythema, no edema, no drainage noted.  Musculoskeletal: Muscle strength 5/5 to all LE muscle groups b/l.   No gross pedal deformities b/l.  No pain, crepitus or joint discomfort with passive/active ROM b/l.  Neurological: Sensation intact 5/5 b/l  with 10 gram monofilament  Vibratory sensation intact b/l.  Assessment: 1. Painful onychomycosis toenails 1-5 b/l  2. Ingrown toenail right hallux, noninfected 3. NIDDM  Plan: 1. Discussed diabetic foot care principles. Literature dispensed on today. 2. Toenails 1-5 b/l were debrided in length and girth without iatrogenic bleeding. Offending nail border debrided and  curretaged right hallux. Border cleansed with alcohol and triple antibiotic applied. No further treatment required by patient/caregiver. 3. Patient to continue soft, supportive shoe gear 4. Patient to report any pedal injuries to medical professional immediately. 5. Follow up 3 months.  6. Patient/POA to call should there be a concern in the interim.

## 2019-07-30 DIAGNOSIS — E789 Disorder of lipoprotein metabolism, unspecified: Secondary | ICD-10-CM | POA: Diagnosis not present

## 2019-07-30 DIAGNOSIS — I10 Essential (primary) hypertension: Secondary | ICD-10-CM | POA: Diagnosis not present

## 2019-07-30 DIAGNOSIS — D509 Iron deficiency anemia, unspecified: Secondary | ICD-10-CM | POA: Diagnosis not present

## 2019-07-30 DIAGNOSIS — E118 Type 2 diabetes mellitus with unspecified complications: Secondary | ICD-10-CM | POA: Diagnosis not present

## 2019-08-11 ENCOUNTER — Ambulatory Visit: Payer: Medicare PPO | Admitting: Podiatry

## 2019-08-11 ENCOUNTER — Other Ambulatory Visit: Payer: Self-pay

## 2019-08-11 ENCOUNTER — Encounter: Payer: Self-pay | Admitting: Podiatry

## 2019-08-11 DIAGNOSIS — B351 Tinea unguium: Secondary | ICD-10-CM

## 2019-08-11 DIAGNOSIS — M79675 Pain in left toe(s): Secondary | ICD-10-CM

## 2019-08-11 DIAGNOSIS — E119 Type 2 diabetes mellitus without complications: Secondary | ICD-10-CM

## 2019-08-11 DIAGNOSIS — Z794 Long term (current) use of insulin: Secondary | ICD-10-CM

## 2019-08-11 DIAGNOSIS — L600 Ingrowing nail: Secondary | ICD-10-CM

## 2019-08-11 DIAGNOSIS — M79674 Pain in right toe(s): Secondary | ICD-10-CM

## 2019-08-11 NOTE — Patient Instructions (Signed)
Diabetes Mellitus and Foot Care Foot care is an important part of your health, especially when you have diabetes. Diabetes may cause you to have problems because of poor blood flow (circulation) to your feet and legs, which can cause your skin to:  Become thinner and drier.  Break more easily.  Heal more slowly.  Peel and crack. You may also have nerve damage (neuropathy) in your legs and feet, causing decreased feeling in them. This means that you may not notice minor injuries to your feet that could lead to more serious problems. Noticing and addressing any potential problems early is the best way to prevent future foot problems. How to care for your feet Foot hygiene  Wash your feet daily with warm water and mild soap. Do not use hot water. Then, pat your feet and the areas between your toes until they are completely dry. Do not soak your feet as this can dry your skin.  Trim your toenails straight across. Do not dig under them or around the cuticle. File the edges of your nails with an emery board or nail file.  Apply a moisturizing lotion or petroleum jelly to the skin on your feet and to dry, brittle toenails. Use lotion that does not contain alcohol and is unscented. Do not apply lotion between your toes. Shoes and socks  Wear clean socks or stockings every day. Make sure they are not too tight. Do not wear knee-high stockings since they may decrease blood flow to your legs.  Wear shoes that fit properly and have enough cushioning. Always look in your shoes before you put them on to be sure there are no objects inside.  To break in new shoes, wear them for just a few hours a day. This prevents injuries on your feet. Wounds, scrapes, corns, and calluses  Check your feet daily for blisters, cuts, bruises, sores, and redness. If you cannot see the bottom of your feet, use a mirror or ask someone for help.  Do not cut corns or calluses or try to remove them with medicine.  If you  find a minor scrape, cut, or break in the skin on your feet, keep it and the skin around it clean and dry. You may clean these areas with mild soap and water. Do not clean the area with peroxide, alcohol, or iodine.  If you have a wound, scrape, corn, or callus on your foot, look at it several times a day to make sure it is healing and not infected. Check for: ? Redness, swelling, or pain. ? Fluid or blood. ? Warmth. ? Pus or a bad smell. General instructions  Do not cross your legs. This may decrease blood flow to your feet.  Do not use heating pads or hot water bottles on your feet. They may burn your skin. If you have lost feeling in your feet or legs, you may not know this is happening until it is too late.  Protect your feet from hot and cold by wearing shoes, such as at the beach or on hot pavement.  Schedule a complete foot exam at least once a year (annually) or more often if you have foot problems. If you have foot problems, report any cuts, sores, or bruises to your health care provider immediately. Contact a health care provider if:  You have a medical condition that increases your risk of infection and you have any cuts, sores, or bruises on your feet.  You have an injury that is not   healing.  You have redness on your legs or feet.  You feel burning or tingling in your legs or feet.  You have pain or cramps in your legs and feet.  Your legs or feet are numb.  Your feet always feel cold.  You have pain around a toenail. Get help right away if:  You have a wound, scrape, corn, or callus on your foot and: ? You have pain, swelling, or redness that gets worse. ? You have fluid or blood coming from the wound, scrape, corn, or callus. ? Your wound, scrape, corn, or callus feels warm to the touch. ? You have pus or a bad smell coming from the wound, scrape, corn, or callus. ? You have a fever. ? You have a red line going up your leg. Summary  Check your feet every day  for cuts, sores, red spots, swelling, and blisters.  Moisturize feet and legs daily.  Wear shoes that fit properly and have enough cushioning.  If you have foot problems, report any cuts, sores, or bruises to your health care provider immediately.  Schedule a complete foot exam at least once a year (annually) or more often if you have foot problems. This information is not intended to replace advice given to you by your health care provider. Make sure you discuss any questions you have with your health care provider. Document Revised: 03/03/2019 Document Reviewed: 07/12/2016 Elsevier Patient Education  2020 [employer]  

## 2019-08-13 NOTE — Progress Notes (Signed)
Subjective: Matthew Calderon presents today for follow up of preventative diabetic foot care, painful mycotic nails b/l that are difficult to trim. Pain interferes with ambulation. Aggravating factors include wearing enclosed shoe gear. Pain is relieved with periodic professional debridement and ingrown toenail to the right, 2nd toe.   Allergies  Allergen Reactions  . Penicillins Hives    DID THE REACTION INVOLVE: Swelling of the face/tongue/throat, SOB, or low BP? No Sudden or severe rash/hives, skin peeling, or the inside of the mouth or nose? No Did it require medical treatment? No When did it last happen?childhood allergy If all above answers are "NO", may proceed with cephalosporin use.   . Sulfa Antibiotics Hives    Objective: There were no vitals filed for this visit.  Vascular Examination:  Capillary fill time to digits <3s b/l, palpable PT pulses b/l, faintly palpable DP pulses b/l, pedal hair sparse b/l and skin temperature gradient within normal limits b/l  Dermatological Examination: Pedal skin is thin shiny, atrophic bilaterally, no open wounds bilaterally, no interdigital macerations bilaterally, toenails 1-5 b/l elongated, dystrophic, thickened, crumbly with subungual debris and Incurvated nailplate right hallux medial border with tenderness to palpation. No erythema, no edema, no drainage noted.  Musculoskeletal: Normal muscle strength 5/5 to all lower extremity muscle groups bilaterally, no gross bony deformities bilaterally and no pain crepitus or joint limitation noted with ROM b/l  Neurological: Protective sensation intact 5/5 intact bilaterally with 10g monofilament b/l and vibratory sensation intact b/l  Assessment: 1. Pain due to onychomycosis of toenails of both feet   2. Ingrown toenail without infection   3. Type 2 diabetes mellitus without complication, with long-term current use of insulin (HCC)    Plan: -Toenails 1-5 b/l were debrided in length and  girth without iatrogenic bleeding. -Patient to continue soft, supportive shoe gear daily. -Patient to report any pedal injuries to medical professional immediately. -Offending nail border debrided and curretaged right hallux. Border cleansed with alcohol and triple antibiotic applied. No further treatment required by patient/caregiver. -Patient/POA to call should there be question/concern in the interim.  Return in about 3 months (around 11/08/2019) for diabetic nail trim.

## 2019-09-27 DIAGNOSIS — H401133 Primary open-angle glaucoma, bilateral, severe stage: Secondary | ICD-10-CM | POA: Diagnosis not present

## 2019-10-25 DIAGNOSIS — E118 Type 2 diabetes mellitus with unspecified complications: Secondary | ICD-10-CM | POA: Diagnosis not present

## 2019-10-25 DIAGNOSIS — E789 Disorder of lipoprotein metabolism, unspecified: Secondary | ICD-10-CM | POA: Diagnosis not present

## 2019-10-25 DIAGNOSIS — M109 Gout, unspecified: Secondary | ICD-10-CM | POA: Diagnosis not present

## 2019-10-25 DIAGNOSIS — E291 Testicular hypofunction: Secondary | ICD-10-CM | POA: Diagnosis not present

## 2019-10-25 DIAGNOSIS — E559 Vitamin D deficiency, unspecified: Secondary | ICD-10-CM | POA: Diagnosis not present

## 2019-11-01 DIAGNOSIS — M810 Age-related osteoporosis without current pathological fracture: Secondary | ICD-10-CM | POA: Diagnosis not present

## 2019-11-01 DIAGNOSIS — H3530 Unspecified macular degeneration: Secondary | ICD-10-CM | POA: Diagnosis not present

## 2019-11-01 DIAGNOSIS — M419 Scoliosis, unspecified: Secondary | ICD-10-CM | POA: Diagnosis not present

## 2019-11-01 DIAGNOSIS — M2170 Unequal limb length (acquired), unspecified site: Secondary | ICD-10-CM | POA: Diagnosis not present

## 2019-11-08 DIAGNOSIS — H5203 Hypermetropia, bilateral: Secondary | ICD-10-CM | POA: Diagnosis not present

## 2019-11-08 DIAGNOSIS — H52203 Unspecified astigmatism, bilateral: Secondary | ICD-10-CM | POA: Diagnosis not present

## 2019-11-08 DIAGNOSIS — H524 Presbyopia: Secondary | ICD-10-CM | POA: Diagnosis not present

## 2019-11-10 ENCOUNTER — Encounter: Payer: Self-pay | Admitting: Podiatry

## 2019-11-10 ENCOUNTER — Other Ambulatory Visit: Payer: Self-pay

## 2019-11-10 ENCOUNTER — Ambulatory Visit: Payer: Medicare PPO | Admitting: Podiatry

## 2019-11-10 DIAGNOSIS — M79674 Pain in right toe(s): Secondary | ICD-10-CM | POA: Diagnosis not present

## 2019-11-10 DIAGNOSIS — E119 Type 2 diabetes mellitus without complications: Secondary | ICD-10-CM | POA: Diagnosis not present

## 2019-11-10 DIAGNOSIS — Z794 Long term (current) use of insulin: Secondary | ICD-10-CM

## 2019-11-10 DIAGNOSIS — B351 Tinea unguium: Secondary | ICD-10-CM

## 2019-11-10 DIAGNOSIS — M79675 Pain in left toe(s): Secondary | ICD-10-CM

## 2019-11-10 NOTE — Patient Instructions (Signed)
Diabetes Mellitus and Foot Care Foot care is an important part of your health, especially when you have diabetes. Diabetes may cause you to have problems because of poor blood flow (circulation) to your feet and legs, which can cause your skin to:  Become thinner and drier.  Break more easily.  Heal more slowly.  Peel and crack. You may also have nerve damage (neuropathy) in your legs and feet, causing decreased feeling in them. This means that you may not notice minor injuries to your feet that could lead to more serious problems. Noticing and addressing any potential problems early is the best way to prevent future foot problems. How to care for your feet Foot hygiene  Wash your feet daily with warm water and mild soap. Do not use hot water. Then, pat your feet and the areas between your toes until they are completely dry. Do not soak your feet as this can dry your skin.  Trim your toenails straight across. Do not dig under them or around the cuticle. File the edges of your nails with an emery board or nail file.  Apply a moisturizing lotion or petroleum jelly to the skin on your feet and to dry, brittle toenails. Use lotion that does not contain alcohol and is unscented. Do not apply lotion between your toes. Shoes and socks  Wear clean socks or stockings every day. Make sure they are not too tight. Do not wear knee-high stockings since they may decrease blood flow to your legs.  Wear shoes that fit properly and have enough cushioning. Always look in your shoes before you put them on to be sure there are no objects inside.  To break in new shoes, wear them for just a few hours a day. This prevents injuries on your feet. Wounds, scrapes, corns, and calluses  Check your feet daily for blisters, cuts, bruises, sores, and redness. If you cannot see the bottom of your feet, use a mirror or ask someone for help.  Do not cut corns or calluses or try to remove them with medicine.  If you  find a minor scrape, cut, or break in the skin on your feet, keep it and the skin around it clean and dry. You may clean these areas with mild soap and water. Do not clean the area with peroxide, alcohol, or iodine.  If you have a wound, scrape, corn, or callus on your foot, look at it several times a day to make sure it is healing and not infected. Check for: ? Redness, swelling, or pain. ? Fluid or blood. ? Warmth. ? Pus or a bad smell. General instructions  Do not cross your legs. This may decrease blood flow to your feet.  Do not use heating pads or hot water bottles on your feet. They may burn your skin. If you have lost feeling in your feet or legs, you may not know this is happening until it is too late.  Protect your feet from hot and cold by wearing shoes, such as at the beach or on hot pavement.  Schedule a complete foot exam at least once a year (annually) or more often if you have foot problems. If you have foot problems, report any cuts, sores, or bruises to your health care provider immediately. Contact a health care provider if:  You have a medical condition that increases your risk of infection and you have any cuts, sores, or bruises on your feet.  You have an injury that is not   healing.  You have redness on your legs or feet.  You feel burning or tingling in your legs or feet.  You have pain or cramps in your legs and feet.  Your legs or feet are numb.  Your feet always feel cold.  You have pain around a toenail. Get help right away if:  You have a wound, scrape, corn, or callus on your foot and: ? You have pain, swelling, or redness that gets worse. ? You have fluid or blood coming from the wound, scrape, corn, or callus. ? Your wound, scrape, corn, or callus feels warm to the touch. ? You have pus or a bad smell coming from the wound, scrape, corn, or callus. ? You have a fever. ? You have a red line going up your leg. Summary  Check your feet every day  for cuts, sores, red spots, swelling, and blisters.  Moisturize feet and legs daily.  Wear shoes that fit properly and have enough cushioning.  If you have foot problems, report any cuts, sores, or bruises to your health care provider immediately.  Schedule a complete foot exam at least once a year (annually) or more often if you have foot problems. This information is not intended to replace advice given to you by your health care provider. Make sure you discuss any questions you have with your health care provider. Document Revised: 03/03/2019 Document Reviewed: 07/12/2016 Elsevier Patient Education  2020 [employer]  

## 2019-11-17 NOTE — Progress Notes (Signed)
Subjective: TILDON SILVERIA presents today preventative diabetic foot care and painful mycotic nails b/l that are difficult to trim. Pain interferes with ambulation. Aggravating factors include wearing enclosed shoe gear. Pain is relieved with periodic professional debridement.  He voices no new pedal problems on today's visit.  Merri Brunette, MD is patient's PCP.  Past Medical History:  Diagnosis Date  . Anemia    takes iron pill  . Anxiety   . Arthritis   . Depression   . Diabetes mellitus without complication (HCC)   . GERD (gastroesophageal reflux disease)    pepto  . H/O hiatal hernia   . Pneumonia    4 years ago  . PONV (postoperative nausea and vomiting) 05/14/2019  . Post-nasal drip   . Prostate hyperplasia, benign localized, without urinary obstruction   . Seasonal allergies   . Sleep apnea    no CPAP     Current Outpatient Medications on File Prior to Visit  Medication Sig Dispense Refill  . acetaminophen (TYLENOL) 325 MG tablet Take 2 tablets (650 mg total) by mouth every 6 (six) hours as needed for mild pain or fever.    . Aspirin Buf,CaCarb-MgCarb-MgO, 81 MG TABS Take by mouth.    [name] aspirin EC 81 MG EC tablet Take 1 tablet (81 mg total) by mouth daily.    [name] atorvastatin (LIPITOR) 40 MG tablet Take 1 tablet (40 mg total) by mouth daily at 6 PM.    . brimonidine (ALPHAGAN) 0.2 % ophthalmic solution Place 1 drop into both eyes 2 (two) times daily.    . brimonidine (ALPHAGAN) 0.2 % ophthalmic solution Place 1 drop into both eyes 2 times daily.    . carvedilol (COREG) 12.5 MG tablet Take 1 tablet (12.5 mg total) by mouth 2 (two) times daily with a meal.    . Cholecalciferol 25 MCG (1000 UT) tablet Take 2,000 Units by mouth every morning.     . Ferrous Sulfate 143 (45 FE) MG TBCR Take 1 tablet by mouth daily.    . finasteride (PROSCAR) 5 MG tablet Take 5 mg by mouth daily.    . furosemide (LASIX) 40 MG tablet Take 1 tablet (40 mg total) by mouth daily. 30 tablet   .  guaiFENesin (MUCINEX) 600 MG 12 hr tablet Take 1 tablet (600 mg total) by mouth 2 (two) times daily.    . insulin aspart (NOVOLOG) 100 UNIT/ML injection Inject 0-15 Units into the skin 3 (three) times daily with meals. 10 mL 11  . insulin glargine (LANTUS) 100 UNIT/ML injection Inject 0.12 mLs (12 Units total) into the skin daily. 10 mL 11  . Melatonin 10 MG TBCR Take 1 tablet by mouth at bedtime.    . metFORMIN (GLUCOPHAGE-XR) 500 MG 24 hr tablet Take 1,500 mg by mouth daily with breakfast.    . methocarbamol (ROBAXIN) 500 MG tablet Take 1 tablet (500 mg total) by mouth every 6 (six) hours as needed for muscle spasms. (Patient not taking: Reported on 06/20/2018) 50 tablet 0  . Multiple Vitamins-Minerals (PRESERVISION AREDS 2) CAPS Take 2 capsules by mouth 2 (two) times daily.    [name] MYRBETRIQ 50 MG TB24 tablet Take 50 mg by mouth daily.    [name] nystatin (MYCOSTATIN/NYSTOP) powder Apply 1 Bottle topically 2 (two) times daily.    . pantoprazole (PROTONIX) 40 MG tablet Take 1 tablet (40 mg total) by mouth daily.    . potassium chloride (K-DUR,KLOR-CON) 10 MEQ tablet Take 1 tablet (10 mEq total) by mouth  daily.    . sacubitril-valsartan (ENTRESTO) 24-26 MG Take 1 tablet by mouth 2 (two) times daily. 60 tablet   . SYNJARDY XR 25-1000 MG TB24     . tamsulosin (FLOMAX) 0.4 MG CAPS capsule Take 0.4 mg by mouth daily.    . timolol (TIMOPTIC) 0.5 % ophthalmic solution Place 1 drop into both eyes 2 (two) times daily.    . traZODone (DESYREL) 50 MG tablet Take 50 mg by mouth at bedtime.     No current facility-administered medications on file prior to visit.     Allergies  Allergen Reactions  . Penicillins Hives    DID THE REACTION INVOLVE: Swelling of the face/tongue/throat, SOB, or low BP? No Sudden or severe rash/hives, skin peeling, or the inside of the mouth or nose? No Did it require medical treatment? No When did it last happen?childhood allergy If all above answers are "NO", may proceed with  cephalosporin use.   . Sulfa Antibiotics Hives    Objective: Matthew Calderon is a pleasant 86 y.o. y.o. Patient Race: White or Caucasian [1]  male in NAD. AAO x 3.  There were no vitals filed for this visit.  Vascular Examination: Neurovascular status unchanged b/l. Capillary fill time to digits <3 seconds b/l. Palpable DP pulses b/l. Palpable PT pulses b/l. Pedal hair sparse b/l. Skin temperature gradient within normal limits b/l.  Dermatological Examination: Pedal skin is thin shiny, atrophic bilaterally. No open wounds bilaterally. No interdigital macerations bilaterally. Toenails 1-5 b/l elongated, dystrophic, thickened, crumbly with subungual debris and tenderness to dorsal palpation.  Musculoskeletal: Normal muscle strength 5/5 to all lower extremity muscle groups bilaterally. No gross bony deformities bilaterally. No pain crepitus or joint limitation noted with ROM b/l.  Neurological Examination: Protective sensation intact 5/5 intact bilaterally with 10g monofilament b/l. Vibratory sensation intact b/l. Proprioception intact bilaterally.  Assessment: 1. Pain due to onychomycosis of toenails of both feet   2. Type 2 diabetes mellitus without complication, with long-term current use of insulin ([place])   Plan: -Examined patient. -No new findings. No new orders. -Continue diabetic foot care principles. Literature dispensed on today.  -Toenails 1-5 b/l were debrided in length and girth with sterile nail nippers and dremel without iatrogenic bleeding.  -Patient to continue soft, supportive shoe gear daily. -Patient to report any pedal injuries to medical professional immediately. -Iatrogenic laceration sustained during L 2nd toe. Treated with Lumicain Hemostatic Solution and alcohol. No further treatment required by patient. -Patient/POA to call should there be question/concern in the interim.  Return in about 3 months (around 02/10/2020) for diabetic nail trim.  Marzetta Board, DPM

## 2020-01-11 ENCOUNTER — Emergency Department (HOSPITAL_COMMUNITY): Payer: Medicare PPO

## 2020-01-11 ENCOUNTER — Observation Stay (HOSPITAL_COMMUNITY)
Admission: EM | Admit: 2020-01-11 | Discharge: 2020-01-14 | Disposition: A | Discharge status: Skilled Nursing Facility | Payer: Medicare PPO | Attending: Family Medicine | Admitting: Family Medicine

## 2020-01-11 ENCOUNTER — Other Ambulatory Visit: Payer: Self-pay

## 2020-01-11 ENCOUNTER — Encounter (HOSPITAL_COMMUNITY): Payer: Self-pay

## 2020-01-11 DIAGNOSIS — D649 Anemia, unspecified: Secondary | ICD-10-CM | POA: Diagnosis present

## 2020-01-11 DIAGNOSIS — S52592A Other fractures of lower end of left radius, initial encounter for closed fracture: Secondary | ICD-10-CM | POA: Diagnosis not present

## 2020-01-11 DIAGNOSIS — Y999 Unspecified external cause status: Secondary | ICD-10-CM | POA: Diagnosis not present

## 2020-01-11 DIAGNOSIS — K2100 Gastro-esophageal reflux disease with esophagitis, without bleeding: Secondary | ICD-10-CM | POA: Diagnosis present

## 2020-01-11 DIAGNOSIS — S52571A Other intraarticular fracture of lower end of right radius, initial encounter for closed fracture: Secondary | ICD-10-CM | POA: Diagnosis not present

## 2020-01-11 DIAGNOSIS — R05 Cough: Secondary | ICD-10-CM

## 2020-01-11 DIAGNOSIS — R42 Dizziness and giddiness: Secondary | ICD-10-CM | POA: Diagnosis not present

## 2020-01-11 DIAGNOSIS — Z7982 Long term (current) use of aspirin: Secondary | ICD-10-CM | POA: Insufficient documentation

## 2020-01-11 DIAGNOSIS — H05231 Hemorrhage of right orbit: Secondary | ICD-10-CM | POA: Diagnosis not present

## 2020-01-11 DIAGNOSIS — W01198A Fall on same level from slipping, tripping and stumbling with subsequent striking against other object, initial encounter: Secondary | ICD-10-CM | POA: Diagnosis not present

## 2020-01-11 DIAGNOSIS — Y92012 Bathroom of single-family (private) house as the place of occurrence of the external cause: Secondary | ICD-10-CM | POA: Insufficient documentation

## 2020-01-11 DIAGNOSIS — S2241XD Multiple fractures of ribs, right side, subsequent encounter for fracture with routine healing: Secondary | ICD-10-CM

## 2020-01-11 DIAGNOSIS — S2241XA Multiple fractures of ribs, right side, initial encounter for closed fracture: Principal | ICD-10-CM | POA: Insufficient documentation

## 2020-01-11 DIAGNOSIS — Z87891 Personal history of nicotine dependence: Secondary | ICD-10-CM | POA: Insufficient documentation

## 2020-01-11 DIAGNOSIS — Z961 Presence of intraocular lens: Secondary | ICD-10-CM | POA: Diagnosis not present

## 2020-01-11 DIAGNOSIS — Z96643 Presence of artificial hip joint, bilateral: Secondary | ICD-10-CM | POA: Diagnosis not present

## 2020-01-11 DIAGNOSIS — W19XXXA Unspecified fall, initial encounter: Secondary | ICD-10-CM

## 2020-01-11 DIAGNOSIS — S62101A Fracture of unspecified carpal bone, right wrist, initial encounter for closed fracture: Secondary | ICD-10-CM | POA: Diagnosis not present

## 2020-01-11 DIAGNOSIS — Z79899 Other long term (current) drug therapy: Secondary | ICD-10-CM | POA: Diagnosis not present

## 2020-01-11 DIAGNOSIS — I5032 Chronic diastolic (congestive) heart failure: Secondary | ICD-10-CM | POA: Diagnosis present

## 2020-01-11 DIAGNOSIS — Z96653 Presence of artificial knee joint, bilateral: Secondary | ICD-10-CM | POA: Insufficient documentation

## 2020-01-11 DIAGNOSIS — M25531 Pain in right wrist: Secondary | ICD-10-CM | POA: Diagnosis not present

## 2020-01-11 DIAGNOSIS — R531 Weakness: Secondary | ICD-10-CM | POA: Diagnosis not present

## 2020-01-11 DIAGNOSIS — R2681 Unsteadiness on feet: Secondary | ICD-10-CM | POA: Insufficient documentation

## 2020-01-11 DIAGNOSIS — N400 Enlarged prostate without lower urinary tract symptoms: Secondary | ICD-10-CM | POA: Diagnosis not present

## 2020-01-11 DIAGNOSIS — Z743 Need for continuous supervision: Secondary | ICD-10-CM | POA: Diagnosis not present

## 2020-01-11 DIAGNOSIS — S0990XD Unspecified injury of head, subsequent encounter: Secondary | ICD-10-CM

## 2020-01-11 DIAGNOSIS — R053 Chronic cough: Secondary | ICD-10-CM | POA: Diagnosis present

## 2020-01-11 DIAGNOSIS — E119 Type 2 diabetes mellitus without complications: Secondary | ICD-10-CM

## 2020-01-11 DIAGNOSIS — J9811 Atelectasis: Secondary | ICD-10-CM | POA: Diagnosis not present

## 2020-01-11 DIAGNOSIS — K219 Gastro-esophageal reflux disease without esophagitis: Secondary | ICD-10-CM | POA: Diagnosis present

## 2020-01-11 DIAGNOSIS — M542 Cervicalgia: Secondary | ICD-10-CM | POA: Diagnosis not present

## 2020-01-11 DIAGNOSIS — S0181XA Laceration without foreign body of other part of head, initial encounter: Secondary | ICD-10-CM

## 2020-01-11 DIAGNOSIS — S0990XA Unspecified injury of head, initial encounter: Secondary | ICD-10-CM | POA: Diagnosis present

## 2020-01-11 DIAGNOSIS — Z20822 Contact with and (suspected) exposure to covid-19: Secondary | ICD-10-CM | POA: Insufficient documentation

## 2020-01-11 DIAGNOSIS — R0902 Hypoxemia: Secondary | ICD-10-CM | POA: Diagnosis not present

## 2020-01-11 DIAGNOSIS — S0011XA Contusion of right eyelid and periocular area, initial encounter: Secondary | ICD-10-CM | POA: Diagnosis not present

## 2020-01-11 DIAGNOSIS — Z794 Long term (current) use of insulin: Secondary | ICD-10-CM | POA: Insufficient documentation

## 2020-01-11 DIAGNOSIS — D509 Iron deficiency anemia, unspecified: Secondary | ICD-10-CM | POA: Diagnosis not present

## 2020-01-11 DIAGNOSIS — R404 Transient alteration of awareness: Secondary | ICD-10-CM | POA: Diagnosis not present

## 2020-01-11 DIAGNOSIS — S0003XA Contusion of scalp, initial encounter: Secondary | ICD-10-CM | POA: Diagnosis not present

## 2020-01-11 DIAGNOSIS — S199XXA Unspecified injury of neck, initial encounter: Secondary | ICD-10-CM | POA: Diagnosis not present

## 2020-01-11 DIAGNOSIS — I700 Atherosclerosis of aorta: Secondary | ICD-10-CM | POA: Diagnosis not present

## 2020-01-11 DIAGNOSIS — I252 Old myocardial infarction: Secondary | ICD-10-CM | POA: Diagnosis not present

## 2020-01-11 DIAGNOSIS — Y92009 Unspecified place in unspecified non-institutional (private) residence as the place of occurrence of the external cause: Secondary | ICD-10-CM

## 2020-01-11 DIAGNOSIS — I2510 Atherosclerotic heart disease of native coronary artery without angina pectoris: Secondary | ICD-10-CM | POA: Diagnosis not present

## 2020-01-11 DIAGNOSIS — Z9181 History of falling: Secondary | ICD-10-CM | POA: Insufficient documentation

## 2020-01-11 DIAGNOSIS — S2249XA Multiple fractures of ribs, unspecified side, initial encounter for closed fracture: Secondary | ICD-10-CM | POA: Diagnosis present

## 2020-01-11 DIAGNOSIS — S01111A Laceration without foreign body of right eyelid and periocular area, initial encounter: Secondary | ICD-10-CM | POA: Diagnosis not present

## 2020-01-11 DIAGNOSIS — R58 Hemorrhage, not elsewhere classified: Secondary | ICD-10-CM | POA: Diagnosis not present

## 2020-01-11 DIAGNOSIS — M79643 Pain in unspecified hand: Secondary | ICD-10-CM | POA: Diagnosis not present

## 2020-01-11 DIAGNOSIS — H401113 Primary open-angle glaucoma, right eye, severe stage: Secondary | ICD-10-CM

## 2020-01-11 DIAGNOSIS — G4733 Obstructive sleep apnea (adult) (pediatric): Secondary | ICD-10-CM | POA: Diagnosis present

## 2020-01-11 LAB — CBC WITH DIFFERENTIAL/PLATELET
Abs Immature Granulocytes: 0.05 10*3/uL (ref 0.00–0.07)
Basophils Absolute: 0 10*3/uL (ref 0.0–0.1)
Basophils Relative: 0 %
Eosinophils Absolute: 0.4 10*3/uL (ref 0.0–0.5)
Eosinophils Relative: 5 %
HCT: 42.6 % (ref 39.0–52.0)
Hemoglobin: 13.9 g/dL (ref 13.0–17.0)
Immature Granulocytes: 1 %
Lymphocytes Relative: 10 %
Lymphs Abs: 0.9 10*3/uL (ref 0.7–4.0)
MCH: 29.4 pg (ref 26.0–34.0)
MCHC: 32.6 g/dL (ref 30.0–36.0)
MCV: 90.1 fL (ref 80.0–100.0)
Monocytes Absolute: 0.8 10*3/uL (ref 0.1–1.0)
Monocytes Relative: 9 %
Neutro Abs: 6.7 10*3/uL (ref 1.7–7.7)
Neutrophils Relative %: 75 %
Platelets: 168 10*3/uL (ref 150–400)
RBC: 4.73 MIL/uL (ref 4.22–5.81)
RDW: 14.5 % (ref 11.5–15.5)
WBC: 8.9 10*3/uL (ref 4.0–10.5)
nRBC: 0 % (ref 0.0–0.2)

## 2020-01-11 LAB — BASIC METABOLIC PANEL
Anion gap: 13 (ref 5–15)
BUN: 23 mg/dL (ref 8–23)
CO2: 21 mmol/L — ABNORMAL LOW (ref 22–32)
Calcium: 8.4 mg/dL — ABNORMAL LOW (ref 8.9–10.3)
Chloride: 108 mmol/L (ref 98–111)
Creatinine, Ser: 0.91 mg/dL (ref 0.61–1.24)
GFR calc Af Amer: >60 mL/min (ref >60)
GFR calc non Af Amer: >60 mL/min (ref >60)
Glucose, Bld: 137 mg/dL — ABNORMAL HIGH (ref 70–99)
Potassium: 4.2 mmol/L (ref 3.5–5.1)
Sodium: 142 mmol/L (ref 135–145)

## 2020-01-11 LAB — CBG MONITORING, ED
Glucose-Capillary: 126 mg/dL — ABNORMAL HIGH (ref 70–99)
Glucose-Capillary: 240 mg/dL — ABNORMAL HIGH (ref 70–99)

## 2020-01-11 LAB — PROTIME-INR
INR: 1.1 (ref 0.8–1.2)
Prothrombin Time: 13.5 seconds (ref 11.4–15.2)

## 2020-01-11 LAB — SARS CORONAVIRUS 2 BY RT PCR (HOSPITAL ORDER, PERFORMED IN ~~LOC~~ HOSPITAL LAB): SARS Coronavirus 2: NEGATIVE

## 2020-01-11 MED ORDER — ASPIRIN EC 81 MG PO TBEC
81.0000 mg | DELAYED_RELEASE_TABLET | Freq: Every day | ORAL | Status: DC
  Administered 2020-01-12 – 2020-01-14 (×3): 81 mg via ORAL
  Filled 2020-01-11 (×3): qty 1

## 2020-01-11 MED ORDER — TIMOLOL MALEATE 0.5 % OP SOLN
1.0000 [drp] | Freq: Two times a day (BID) | OPHTHALMIC | Status: DC
  Administered 2020-01-12 – 2020-01-14 (×5): 1 [drp] via OPHTHALMIC
  Filled 2020-01-11: qty 5

## 2020-01-11 MED ORDER — IOHEXOL 300 MG/ML  SOLN
75.0000 mL | Freq: Once | INTRAMUSCULAR | Status: AC | PRN
  Administered 2020-01-11: 75 mL via INTRAVENOUS

## 2020-01-11 MED ORDER — MIRABEGRON ER 25 MG PO TB24
50.0000 mg | ORAL_TABLET | Freq: Every day | ORAL | Status: DC
  Administered 2020-01-12 – 2020-01-14 (×3): 50 mg via ORAL
  Filled 2020-01-11 (×3): qty 2

## 2020-01-11 MED ORDER — FUROSEMIDE 40 MG PO TABS
40.0000 mg | ORAL_TABLET | Freq: Every day | ORAL | Status: DC
  Administered 2020-01-12 – 2020-01-14 (×3): 40 mg via ORAL
  Filled 2020-01-11 (×3): qty 1

## 2020-01-11 MED ORDER — MORPHINE SULFATE (PF) 2 MG/ML IV SOLN
1.0000 mg | INTRAVENOUS | Status: DC | PRN
  Administered 2020-01-12: 1 mg via INTRAVENOUS
  Filled 2020-01-11: qty 1

## 2020-01-11 MED ORDER — ACETAMINOPHEN 650 MG RE SUPP: 650.0000 mg | Freq: Four times a day (QID) | RECTAL | Status: DC | PRN

## 2020-01-11 MED ORDER — PANTOPRAZOLE SODIUM 40 MG PO TBEC
40.0000 mg | DELAYED_RELEASE_TABLET | Freq: Every day | ORAL | Status: DC
  Administered 2020-01-12 – 2020-01-14 (×3): 40 mg via ORAL
  Filled 2020-01-11 (×3): qty 1

## 2020-01-11 MED ORDER — MELATONIN 5 MG PO TABS
10.0000 mg | ORAL_TABLET | Freq: Every day | ORAL | Status: DC
  Administered 2020-01-12 – 2020-01-13 (×2): 10 mg via ORAL
  Filled 2020-01-11 (×2): qty 2

## 2020-01-11 MED ORDER — CARVEDILOL 12.5 MG PO TABS
12.5000 mg | ORAL_TABLET | Freq: Two times a day (BID) | ORAL | Status: DC
  Administered 2020-01-12 – 2020-01-14 (×3): 12.5 mg via ORAL
  Filled 2020-01-11 (×4): qty 1

## 2020-01-11 MED ORDER — ACETAMINOPHEN 325 MG PO TABS: 650.0000 mg | ORAL_TABLET | Freq: Four times a day (QID) | ORAL | Status: DC | PRN

## 2020-01-11 MED ORDER — BRIMONIDINE TARTRATE 0.2 % OP SOLN
1.0000 [drp] | Freq: Two times a day (BID) | OPHTHALMIC | Status: DC
  Administered 2020-01-12 – 2020-01-14 (×6): 1 [drp] via OPHTHALMIC
  Filled 2020-01-11 (×3): qty 5

## 2020-01-11 MED ORDER — POTASSIUM CHLORIDE CRYS ER 10 MEQ PO TBCR
10.0000 meq | EXTENDED_RELEASE_TABLET | Freq: Every day | ORAL | Status: DC
  Administered 2020-01-12 – 2020-01-14 (×3): 10 meq via ORAL
  Filled 2020-01-11 (×3): qty 1

## 2020-01-11 MED ORDER — FERROUS SULFATE ER 143 (45 FE) MG PO TBCR: 1.0000 | EXTENDED_RELEASE_TABLET | Freq: Every day | ORAL | Status: DC

## 2020-01-11 MED ORDER — PROSIGHT PO TABS
1.0000 | ORAL_TABLET | Freq: Two times a day (BID) | ORAL | Status: DC
  Administered 2020-01-12: 1 via ORAL
  Filled 2020-01-11: qty 1

## 2020-01-11 MED ORDER — ONDANSETRON HCL 4 MG/2ML IJ SOLN: 4.0000 mg | Freq: Four times a day (QID) | INTRAMUSCULAR | Status: DC | PRN

## 2020-01-11 MED ORDER — HYDROCODONE-ACETAMINOPHEN 5-325 MG PO TABS: 1.0000 | ORAL_TABLET | ORAL | Status: DC | PRN

## 2020-01-11 MED ORDER — ONDANSETRON HCL 4 MG PO TABS: 4.0000 mg | ORAL_TABLET | Freq: Four times a day (QID) | ORAL | Status: DC | PRN

## 2020-01-11 MED ORDER — LIDOCAINE-EPINEPHRINE (PF) 2 %-1:200000 IJ SOLN
10.0000 mL | Freq: Once | INTRAMUSCULAR | Status: AC
  Administered 2020-01-11: 10 mL
  Filled 2020-01-11: qty 20

## 2020-01-11 MED ORDER — VITAMIN D 25 MCG (1000 UNIT) PO TABS
2000.0000 [IU] | ORAL_TABLET | Freq: Every day | ORAL | Status: DC
  Administered 2020-01-12 – 2020-01-14 (×3): 2000 [IU] via ORAL
  Filled 2020-01-11 (×3): qty 2

## 2020-01-11 MED ORDER — SODIUM CHLORIDE (PF) 0.9 % IJ SOLN
INTRAMUSCULAR | Status: AC
  Filled 2020-01-11: qty 50

## 2020-01-11 MED ORDER — INSULIN GLARGINE 100 UNIT/ML ~~LOC~~ SOLN
12.0000 [IU] | Freq: Every day | SUBCUTANEOUS | Status: DC
  Administered 2020-01-12 – 2020-01-14 (×3): 12 [IU] via SUBCUTANEOUS
  Filled 2020-01-11 (×3): qty 0.12

## 2020-01-11 MED ORDER — INSULIN ASPART 100 UNIT/ML ~~LOC~~ SOLN
0.0000 [IU] | Freq: Three times a day (TID) | SUBCUTANEOUS | Status: DC
  Administered 2020-01-12: 2 [IU] via SUBCUTANEOUS
  Filled 2020-01-11: qty 0.15

## 2020-01-11 MED ORDER — INSULIN ASPART 100 UNIT/ML ~~LOC~~ SOLN
0.0000 [IU] | SUBCUTANEOUS | Status: AC
  Filled 2020-01-11: qty 0.06

## 2020-01-11 MED ORDER — TRAZODONE HCL 100 MG PO TABS
50.0000 mg | ORAL_TABLET | Freq: Every day | ORAL | Status: DC
  Administered 2020-01-12 – 2020-01-13 (×3): 50 mg via ORAL
  Filled 2020-01-11 (×3): qty 1

## 2020-01-11 MED ORDER — DOCUSATE SODIUM 100 MG PO CAPS
100.0000 mg | ORAL_CAPSULE | Freq: Two times a day (BID) | ORAL | Status: DC
  Administered 2020-01-12 – 2020-01-14 (×5): 100 mg via ORAL
  Filled 2020-01-11 (×5): qty 1

## 2020-01-11 MED ORDER — FINASTERIDE 5 MG PO TABS
5.0000 mg | ORAL_TABLET | Freq: Every day | ORAL | Status: DC
  Administered 2020-01-12 – 2020-01-14 (×3): 5 mg via ORAL
  Filled 2020-01-11 (×3): qty 1

## 2020-01-11 MED ORDER — SACUBITRIL-VALSARTAN 24-26 MG PO TABS: 1.0000 | ORAL_TABLET | Freq: Two times a day (BID) | ORAL | Status: DC

## 2020-01-11 MED ORDER — MORPHINE SULFATE (PF) 4 MG/ML IV SOLN
4.0000 mg | INTRAVENOUS | Status: DC | PRN
  Administered 2020-01-11: 4 mg via INTRAVENOUS
  Filled 2020-01-11: qty 1

## 2020-01-11 MED ORDER — ATORVASTATIN CALCIUM 40 MG PO TABS
40.0000 mg | ORAL_TABLET | Freq: Every day | ORAL | Status: DC
  Administered 2020-01-12 – 2020-01-13 (×2): 40 mg via ORAL
  Filled 2020-01-11 (×2): qty 1

## 2020-01-11 MED ORDER — TAMSULOSIN HCL 0.4 MG PO CAPS
0.4000 mg | ORAL_CAPSULE | Freq: Every day | ORAL | Status: DC
  Administered 2020-01-12 – 2020-01-14 (×3): 0.4 mg via ORAL
  Filled 2020-01-11 (×3): qty 1

## 2020-01-11 NOTE — ED Provider Notes (Signed)
  Physical Exam  BP (!) 160/86 (BP Location: Left Arm)   Pulse 87   Temp 97.6 F (36.4 C) (Oral)   Resp 17   SpO2 98%   Physical Exam  ED Course/Procedures     Procedures  MDM  Received patient in signout.  Fall.  5 rib fractures and wrist fracture.  Dr. Clarice Pole discussed with assisted-living the lives at.  They think that they can help later but do not think that they can help stabilize me this point.  Discussed with Dr. Ezzard Standing from general surgery at [facility].  They do recommend admission to the hospital but recommend transfer to [facility] so they can have a trauma consult.  Recommend admission to medicine with the trauma consult.  Will be seen by general surgery here if there is a delay in the transfer otherwise Dr. Bedelia Person or the other trauma surgeon at [facility].  If there is no bed availability and patient does end up having to spend the night here will be rounded on tomorrow by the surgeons here.       Benjiman Core, MD 01/11/20 [phone]

## 2020-01-11 NOTE — Consult Note (Signed)
Reason for Consult:trauma Referring Physician: Dr. Fran Lowes  Matthew Calderon is an 86 y.o. male.  HPI: This is a 86 year old gentleman who was brought in by EMS after falling at home.  He has a problem with balance and unsteady gait and uses a walker.  He fell this morning in his bathroom hitting his head.  He arrived here just after 1030.  The trauma service was called and recommended medical admission and transfer to [facility].  There are no beds currently available there.  He is already had his laceration on the side of his head sutured in his wrist splinted.  He has some chest pain but denies shortness of breath.  He denies loss of consciousness.  He denies abdominal pain.  Past Medical History:  Diagnosis Date  . Anemia    takes iron pill  . Anxiety   . Arthritis   . Depression   . Diabetes mellitus without complication (HCC)   . GERD (gastroesophageal reflux disease)    pepto  . H/O hiatal hernia   . Pneumonia    4 years ago  . PONV (postoperative nausea and vomiting) 05/14/2019  . Post-nasal drip   . Prostate hyperplasia, benign localized, without urinary obstruction   . Seasonal allergies   . Sleep apnea    no CPAP    Past Surgical History:  Procedure Laterality Date  . BACK SURGERY  1995   discectomy  . COLONOSCOPY W/ BIOPSIES AND POLYPECTOMY    . EYE SURGERY Bilateral    cataracts  . EYE SURGERY Bilateral    glaucoma shunts  . JOINT REPLACEMENT Left 1992   knee  . JOINT REPLACEMENT Right 1996   knee  . JOINT REPLACEMENT Right 2009   hip  . JOINT REPLACEMENT Left 2005   hip  . KNEE ARTHROSCOPY Left 1981  . KNEE ARTHROSCOPY WITH PATELLA RECONSTRUCTION Right 2010  . KNEE SURGERY Bilateral 1954   knee surgery and placed in a cast for 6 weeks  . KNEE SURGERY  1961   cartilage removed  . LEG SURGERY Bilateral 1974, 1975   straighten legs  . SHOULDER OPEN ROTATOR CUFF REPAIR Right 2010  . TONSILLECTOMY    . TOTAL HIP ARTHROPLASTY Right 01/13/2014    Procedure: TOTAL HIP ARTHROPLASTY ANTERIOR APPROACH;  Surgeon: Velna Ochs, MD;  Location: [facility] OR;  Service: Orthopedics;  Laterality: Right;    Family History  Problem Relation Age of Onset  . Arthritis Mother   . Arthritis Father     Social History:  reports that he quit smoking about 43 years ago. His smoking use included cigarettes. He has a 18.00 pack-year smoking history. He has never used smokeless tobacco. He reports current alcohol use. He reports that he does not use drugs.  Allergies:  Allergies  Allergen Reactions  . Penicillins Hives    DID THE REACTION INVOLVE: Swelling of the face/tongue/throat, SOB, or low BP? No Sudden or severe rash/hives, skin peeling, or the inside of the mouth or nose? No Did it require medical treatment? No When did it last happen?childhood allergy If all above answers are "NO", may proceed with cephalosporin use.   . Sulfa Antibiotics Hives    Medications: I have reviewed the patient's current medications.  Results for orders placed or performed during the hospital encounter of 01/11/20 (from the past 48 hour(s))  POC CBG, ED     Status: Abnormal   Collection Time: 01/11/20 12:25 PM  Result Value Ref Range  Glucose-Capillary 126 (H) 70 - 99 mg/dL    Comment: Glucose reference range applies only to samples taken after fasting for at least 8 hours.  Basic metabolic panel     Status: Abnormal   Collection Time: 01/11/20  1:20 PM  Result Value Ref Range   Sodium 142 135 - 145 mmol/L   Potassium 4.2 3.5 - 5.1 mmol/L   Chloride 108 98 - 111 mmol/L   CO2 21 (L) 22 - 32 mmol/L   Glucose, Bld 137 (H) 70 - 99 mg/dL    Comment: Glucose reference range applies only to samples taken after fasting for at least 8 hours.   BUN 23 8 - 23 mg/dL   Creatinine, Ser 1.09 0.61 - 1.24 mg/dL   Calcium 8.4 (L) 8.9 - 10.3 mg/dL   GFR calc non Af Amer >60 >60 mL/min   GFR calc Af Amer >60 >60 mL/min   Anion gap 13 5 - 15    Comment: Performed at  [facility], [address], [place], [place] [address]  CBC with Differential     Status: None   Collection Time: 01/11/20  1:20 PM  Result Value Ref Range   WBC 8.9 4.0 - 10.5 K/uL   RBC 4.73 4.22 - 5.81 MIL/uL   Hemoglobin 13.9 13.0 - 17.0 g/dL   HCT 73.2 39 - 52 %   MCV 90.1 80.0 - 100.0 fL   MCH 29.4 26.0 - 34.0 pg   MCHC 32.6 30.0 - 36.0 g/dL   RDW 20.2 54.2 - 70.6 %   Platelets 168 150 - 400 K/uL   nRBC 0.0 0.0 - 0.2 %   Neutrophils Relative % 75 %   Neutro Abs 6.7 1.7 - 7.7 K/uL   Lymphocytes Relative 10 %   Lymphs Abs 0.9 0.7 - 4.0 K/uL   Monocytes Relative 9 %   Monocytes Absolute 0.8 0 - 1 K/uL   Eosinophils Relative 5 %   Eosinophils Absolute 0.4 0 - 0 K/uL   Basophils Relative 0 %   Basophils Absolute 0.0 0 - 0 K/uL   Immature Granulocytes 1 %   Abs Immature Granulocytes 0.05 0.00 - 0.07 K/uL    Comment: Performed at [facility], [address], [place], [place] [address]  Protime-INR     Status: None   Collection Time: 01/11/20  1:20 PM  Result Value Ref Range   Prothrombin Time 13.5 11.4 - 15.2 seconds   INR 1.1 0.8 - 1.2    Comment: (NOTE) INR goal varies based on device and disease states. Performed at [facility], [address] [place], [place] [address]   SARS Coronavirus 2 by RT PCR (hospital order, performed in [facility] hospital lab) Nasopharyngeal Nasopharyngeal Swab     Status: None   Collection Time: 01/11/20  6:26 PM   Specimen: Nasopharyngeal Swab  Result Value Ref Range   SARS Coronavirus 2 NEGATIVE NEGATIVE    Comment: (NOTE) SARS-CoV-2 target nucleic acids are NOT DETECTED.  The SARS-CoV-2 RNA is generally detectable in upper and lower respiratory specimens during the acute phase of infection. The lowest concentration of SARS-CoV-2 viral copies this assay can detect is 250 copies / mL. A negative result does not preclude SARS-CoV-2 infection and should not be used as the sole  basis for treatment or other patient management decisions.  A negative result may occur with improper specimen collection / handling, submission of specimen other than nasopharyngeal swab, presence  of viral mutation(s) within the areas targeted by this assay, and inadequate number of viral copies (<250 copies / mL). A negative result must be combined with clinical observations, patient history, and epidemiological information.  Fact Sheet for Patients:   BoilerBrush.com.cy  Fact Sheet for Healthcare Providers: https://pope.com/  This test is not yet approved or  cleared by the [place] FDA and has been authorized for detection and/or diagnosis of SARS-CoV-2 by FDA under an Emergency Use Authorization (EUA).  This EUA will remain in effect (meaning this test can be used) for the duration of the COVID-19 declaration under Section 564(b)(1) of the Act, 21 U.S.C. section 360bbb-3(b)(1), unless the authorization is terminated or revoked sooner.  Performed at [facility], [address] [place], [place] [address]     DG Chest 2 View  Result Date: 01/11/2020 CLINICAL DATA:  Weakness. Status post fall in the bathroom. EXAM: CHEST - 2 VIEW COMPARISON:  CT of the chest June 20, 2018 FINDINGS: Cardiomediastinal silhouette is normal. Mediastinal contours appear intact. Airspace consolidation versus atelectasis in the left lung base. Osseous structures are without acute abnormality. Soft tissues are grossly normal. IMPRESSION: Airspace consolidation versus atelectasis in the left lung base. Electronically Signed   By: Ted Mcalpine M.D.   On: 01/11/2020 12:04   DG Wrist Complete Right  Result Date: 01/11/2020 CLINICAL DATA:  Fall, right wrist pain EXAM: RIGHT WRIST - COMPLETE 3+ VIEW COMPARISON:  None. FINDINGS: Four view radiograph right wrist demonstrates a comminuted fracture of the distal right radius with  impaction of the distal radial articular surface with fracture planes identified involving the radial, volar aspect of the radial styloid as well as the probable ulnar aspect of the distal radius involving the lunate fossa. Radiocarpal articulation is preserved. There is preserved mild volar inclination of the distal radial articular Osseous structures are mildly osteopenic. Erosions are seen within the distal scaphoid and lunate which are likely degenerative in nature. Degenerative chondrocalcinosis is seen involving the TFCC. There is moderate degenerate arthritis involving the triscaphe joint. IMPRESSION: Impacted intra-articular fracture of the distal radius with minimal impaction of the distal radial articular surface. Fracture fragments appear anatomically aligned. Electronically Signed   By: Helyn Numbers MD   On: 01/11/2020 15:16   CT Head Wo Contrast  Result Date: 01/11/2020 CLINICAL DATA:  Larey Seat today, bleeding from top of head, neck pain, no loss of consciousness, history diabetes mellitus EXAM: CT HEAD WITHOUT CONTRAST CT CERVICAL SPINE WITHOUT CONTRAST TECHNIQUE: Multidetector CT imaging of the head and cervical spine was performed following the standard protocol without intravenous contrast. Multiplanar CT image reconstructions of the cervical spine were also generated. COMPARISON:  06/20/2018 FINDINGS: CT HEAD FINDINGS Brain: Generalized atrophy. Normal ventricular morphology. No midline shift or mass effect. Mild small vessel chronic ischemic changes of deep cerebral white matter. Small old lacunar infarct at LEFT thalamus. No intracranial hemorrhage, mass lesion or evidence of acute infarction. No extra-axial fluid collections. Vascular: Atherosclerotic calcification of internal carotid arteries at skull base Skull: Small RIGHT frontal scalp hematoma.  Calvaria intact. Sinuses/Orbits: Clear Other: N/A CT CERVICAL SPINE FINDINGS Alignment: Minimal anterolisthesis at C6-C7 unchanged. Remaining  alignments normal Skull base and vertebrae: Osseous demineralization. Multilevel facet degenerative changes cervical spine. Disc space narrowing and endplate spur formation at C7-T1 and T1-T2 as well as C6-C7. Vertebral body heights maintained. Visualized skull base intact. Incomplete posterior arch C1, developmental anomaly. No fracture, additional subluxation or bone destruction. Soft tissues and spinal canal: Prevertebral  soft tissues normal thickness. Atherosclerotic calcifications at carotid bifurcations. 1.6 cm LEFT thyroid nodule at unchanged; recommend thyroid ultrasound if clinically indicated based on patient age and comorbidities. (Ref: J Am Coll Radiol. 2015 Feb;12(2): 143-50). Disc levels:  No additional abnormalities Upper chest: Lung apices clear Other: N/A IMPRESSION: Atrophy with small vessel chronic ischemic changes of deep cerebral white matter. 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 No acute intracranial abnormalities. Multilevel degenerative disc and facet disease changes of the cervical spine. No acute cervical spine abnormalities. Unchanged 1.6 cm diameter LEFT thyroid nodule; recommend thyroid ultrasound if clinically indicated based on patient age and comorbidities as above. Electronically Signed   By: Ulyses Southward M.D.   On: 01/11/2020 12:20   CT Chest W Contrast  Result Date: 01/11/2020 CLINICAL DATA:  Ground level fall EXAM: CT CHEST WITH CONTRAST TECHNIQUE: Multidetector CT imaging of the chest was performed during intravenous contrast administration. CONTRAST:  54mL OMNIPAQUE IOHEXOL 300 MG/ML  SOLN COMPARISON:  Radiograph 01/11/2020, CTA 06/20/2018 FINDINGS: Cardiovascular: The aortic root is suboptimally assessed given cardiac pulsation artifact. Atherosclerotic plaque within the normal caliber aorta. No acute luminal abnormality nor periaortic stranding or hemorrhage. Minimal atheromatous plaque in the otherwise unremarkable great vessels. Left dominant vertebral arteries. Central  pulmonary arteries are normal caliber. No large central or lobar filling defects on this limited, non tailored evaluation of the pulmonary artery vasculature. Normal heart size. No pericardial effusion. Calcifications upon the aortic leaflets and mitral annulus as well as three-vessel coronary artery calcification. No major venous abnormality. Mediastinum/Nodes: No mediastinal fluid or gas. No acute abnormality of the trachea. Small hiatal hernia without acute abnormality of the esophagus. No worrisome mediastinal, hilar or axillary adenopathy. Diminishing size of a now 10 mm (previously 12 mm), hypoattenuating Left thyroid normal trouble with punctate calcification in the left lobe thyroid gland, possible colloid cyst, no further imaging is warranted in a patient of this age. This follows consensus guidelines: Managing Incidental Thyroid Nodules Detected on Imaging: White Paper of the ACR Incidental Thyroid Findings Committee. J Am Coll Radiol 2015; 12:143-150. and [facility] 3-tiered system for managing ITNs: J Am Coll Radiol. 2015; Feb;12(2): 143-50 Lungs/Pleura: No pneumothorax, effusion or traumatic abnormality of the lung parenchyma. No consolidation or edema. Dependent atelectasis posteriorly. No suspicious pulmonary nodules or masses. Upper Abdomen: Small hiatal hernia, as above. Partial fatty replacement of the pancreas. Calcified gallstone towards the neck of the otherwise unremarkable gallbladder. Small fluid attenuation cyst in the posterior left lobe liver. Accessory splenule. No acute or worrisome upper abdominal findings. Musculoskeletal: No large body wall hematoma. Acute right fifth through ninth lateral rib fractures. Remote deformity of the left fifth rib. No acute vertebral body fracture or height loss. Anterior wedging of the T11 and T12 vertebral body similar to comparison. Dextrocurvature of the thoracolumbar spine with an apex at the T11 level. IMPRESSION: 1. Acute right fifth through ninth lateral  rib fractures. No pneumothorax, effusion, or traumatic abnormality of the lung parenchyma. 2. No other acute traumatic injury identified in the chest. Mild atelectatic change, possibly related to splinting. 3. Remote deformity of the left fifth rib. 4. Cholelithiasis. 5. Aortic Atherosclerosis (ICD10-I70.0). 6. Coronary artery calcifications are present. Please note that the presence of coronary artery calcium documents the presence of coronary artery disease, the severity of this disease and any potential stenosis cannot be assessed on this non-gated CT examination. Electronically Signed   By: Kreg Shropshire M.D.   On: 01/11/2020 15:57   CT Cervical Spine Wo Contrast  Result Date: 01/11/2020 CLINICAL DATA:  Larey SeatFell today, bleeding from top of head, neck pain, no loss of consciousness, history diabetes mellitus EXAM: CT HEAD WITHOUT CONTRAST CT CERVICAL SPINE WITHOUT CONTRAST TECHNIQUE: Multidetector CT imaging of the head and cervical spine was performed following the standard protocol without intravenous contrast. Multiplanar CT image reconstructions of the cervical spine were also generated. COMPARISON:  06/20/2018 FINDINGS: CT HEAD FINDINGS Brain: Generalized atrophy. Normal ventricular morphology. No midline shift or mass effect. Mild small vessel chronic ischemic changes of deep cerebral white matter. Small old lacunar infarct at LEFT thalamus. No intracranial hemorrhage, mass lesion or evidence of acute infarction. No extra-axial fluid collections. Vascular: Atherosclerotic calcification of internal carotid arteries at skull base Skull: Small RIGHT frontal scalp hematoma.  Calvaria intact. Sinuses/Orbits: Clear Other: N/A CT CERVICAL SPINE FINDINGS Alignment: Minimal anterolisthesis at C6-C7 unchanged. Remaining alignments normal Skull base and vertebrae: Osseous demineralization. Multilevel facet degenerative changes cervical spine. Disc space narrowing and endplate spur formation at C7-T1 and T1-T2 as well  as C6-C7. Vertebral body heights maintained. Visualized skull base intact. Incomplete posterior arch C1, developmental anomaly. No fracture, additional subluxation or bone destruction. Soft tissues and spinal canal: Prevertebral soft tissues normal thickness. Atherosclerotic calcifications at carotid bifurcations. 1.6 cm LEFT thyroid nodule at unchanged; recommend thyroid ultrasound if clinically indicated based on patient age and comorbidities. (Ref: J Am Coll Radiol. 2015 Feb;12(2): 143-50). Disc levels:  No additional abnormalities Upper chest: Lung apices clear Other: N/A IMPRESSION: Atrophy with small vessel chronic ischemic changes of deep cerebral white matter. 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 No acute intracranial abnormalities. Multilevel degenerative disc and facet disease changes of the cervical spine. No acute cervical spine abnormalities. Unchanged 1.6 cm diameter LEFT thyroid nodule; recommend thyroid ultrasound if clinically indicated based on patient age and comorbidities as above. Electronically Signed   By: Ulyses SouthwardMark  Boles M.D.   On: 01/11/2020 12:20    Review of Systems  All other systems reviewed and are negative.  Blood pressure 130/75, pulse 91, temperature 97.6 F (36.4 C), temperature source Oral, resp. rate 16, SpO2 97 %. Physical Exam Constitutional:      Appearance: He is not diaphoretic.  HENT:     Head: Normocephalic.     Comments: There is repair laceration at the right temporal area.  There was continued bleeding so I applied a pressure dressing after holding pressure.  He has swelling and ecchymosis around the right eye Neck:     Trachea: Trachea normal.     Comments: There is no cervical tenderness Cardiovascular:     Rate and Rhythm: Normal rate and regular rhythm.     Heart sounds: Normal heart sounds.  Pulmonary:     Effort: Pulmonary effort is normal. No tachypnea.     Breath sounds: Normal breath sounds. No stridor or decreased air movement.     Comments:  There is right-sided chest wall tenderness laterally Abdominal:     General: Abdomen is flat. There is no distension.     Palpations: Abdomen is soft.     Tenderness: There is no abdominal tenderness.  Musculoskeletal:     Cervical back: Full passive range of motion without pain and neck supple.     Comments: His right wrist is in a splint and he is in a sling.  There are no other long bone abnormalities.  There is arthritic deformity to the left hand  Skin:    General: Skin is warm and dry.  Neurological:  General: No focal deficit present.     Mental Status: He is alert and oriented to person, place, and time.     GCS: GCS eye subscore is 4. GCS verbal subscore is 5. GCS motor subscore is 6.     Motor: Motor function is intact.  Psychiatric:        Attention and Perception: Attention normal.        Mood and Affect: Mood and affect normal.        Behavior: Behavior is cooperative.     Assessment/Plan: Patient status post fall with multiple trauma and multiple chronic comorbidities Injuries include: Multiple right rib fractures                            Right wrist fracture                            Facial laceration  He is being mated to the medical service and hopefully will be transferred to the trauma service at [facility].  From a trauma standpoint he needs pain control and pulmonary toilet regarding the rib fractures.  There is no evidence of pneumothorax.  I reviewed the CAT scan of his head, cervical spine, and chest.  He has no obvious abdominal trauma.  We will repeat his chest x-ray in the morning and have him work on his incentive spirometer.  Abigail Miyamoto 01/11/2020, 7:40 PM

## 2020-01-11 NOTE — Consult Note (Signed)
Reason for Consult: Distal radius fracture minimally displaced Referring Physician: ER staff  Matthew Calderon is an 86 y.o. male.  HPI: Status post fall/traumatic event with multiple rib fractures as well as a nondisplaced distal radius fracture.  I reviewed his chart at length.  It appears his biggest issue is going to be the rib fractures.  He has had a thorough work-up.  The patient has no prior history of injury to the wrist that we are aware of.  His right wrist examination is noted.  He is in a sugar tong splint at present time.  I reviewed the pertinent radiographs and other issues.  Past Medical History:  Diagnosis Date  . Anemia    takes iron pill  . Anxiety   . Arthritis   . Depression   . Diabetes mellitus without complication (HCC)   . GERD (gastroesophageal reflux disease)    pepto  . H/O hiatal hernia   . Pneumonia    4 years ago  . PONV (postoperative nausea and vomiting) 05/14/2019  . Post-nasal drip   . Prostate hyperplasia, benign localized, without urinary obstruction   . Seasonal allergies   . Sleep apnea    no CPAP    Past Surgical History:  Procedure Laterality Date  . BACK SURGERY  1995   discectomy  . COLONOSCOPY W/ BIOPSIES AND POLYPECTOMY    . EYE SURGERY Bilateral    cataracts  . EYE SURGERY Bilateral    glaucoma shunts  . JOINT REPLACEMENT Left 1992   knee  . JOINT REPLACEMENT Right 1996   knee  . JOINT REPLACEMENT Right 2009   hip  . JOINT REPLACEMENT Left 2005   hip  . KNEE ARTHROSCOPY Left 1981  . KNEE ARTHROSCOPY WITH PATELLA RECONSTRUCTION Right 2010  . KNEE SURGERY Bilateral 1954   knee surgery and placed in a cast for 6 weeks  . KNEE SURGERY  1961   cartilage removed  . LEG SURGERY Bilateral 1974, 1975   straighten legs  . SHOULDER OPEN ROTATOR CUFF REPAIR Right 2010  . TONSILLECTOMY    . TOTAL HIP ARTHROPLASTY Right 01/13/2014   Procedure: TOTAL HIP ARTHROPLASTY ANTERIOR APPROACH;  Surgeon: Velna Ochs, MD;   Location: [facility] OR;  Service: Orthopedics;  Laterality: Right;    Family History  Problem Relation Age of Onset  . Arthritis Mother   . Arthritis Father     Social History:  reports that he quit smoking about 43 years ago. His smoking use included cigarettes. He has a 18.00 pack-year smoking history. He has never used smokeless tobacco. He reports current alcohol use. He reports that he does not use drugs.  Allergies:  Allergies  Allergen Reactions  . Penicillins Hives    DID THE REACTION INVOLVE: Swelling of the face/tongue/throat, SOB, or low BP? No Sudden or severe rash/hives, skin peeling, or the inside of the mouth or nose? No Did it require medical treatment? No When did it last happen?childhood allergy If all above answers are "NO", may proceed with cephalosporin use.   . Sulfa Antibiotics Hives    Medications: I have reviewed the patient's current medications.  Results for orders placed or performed during the hospital encounter of 01/11/20 (from the past 48 hour(s))  POC CBG, ED     Status: Abnormal   Collection Time: 01/11/20 12:25 PM  Result Value Ref Range   Glucose-Capillary 126 (H) 70 - 99 mg/dL    Comment: Glucose reference range applies only to samples  taken after fasting for at least 8 hours.  Basic metabolic panel     Status: Abnormal   Collection Time: 01/11/20  1:20 PM  Result Value Ref Range   Sodium 142 135 - 145 mmol/L   Potassium 4.2 3.5 - 5.1 mmol/L   Chloride 108 98 - 111 mmol/L   CO2 21 (L) 22 - 32 mmol/L   Glucose, Bld 137 (H) 70 - 99 mg/dL    Comment: Glucose reference range applies only to samples taken after fasting for at least 8 hours.   BUN 23 8 - 23 mg/dL   Creatinine, Ser 2.67 0.61 - 1.24 mg/dL   Calcium 8.4 (L) 8.9 - 10.3 mg/dL   GFR calc non Af Amer >60 >60 mL/min   GFR calc Af Amer >60 >60 mL/min   Anion gap 13 5 - 15    Comment: Performed at [facility], [address] [place], [place] [address]  CBC with  Differential     Status: None   Collection Time: 01/11/20  1:20 PM  Result Value Ref Range   WBC 8.9 4.0 - 10.5 K/uL   RBC 4.73 4.22 - 5.81 MIL/uL   Hemoglobin 13.9 13.0 - 17.0 g/dL   HCT 09.9 39 - 52 %   MCV 90.1 80.0 - 100.0 fL   MCH 29.4 26.0 - 34.0 pg   MCHC 32.6 30.0 - 36.0 g/dL   RDW 83.3 82.5 - 05.3 %   Platelets 168 150 - 400 K/uL   nRBC 0.0 0.0 - 0.2 %   Neutrophils Relative % 75 %   Neutro Abs 6.7 1.7 - 7.7 K/uL   Lymphocytes Relative 10 %   Lymphs Abs 0.9 0.7 - 4.0 K/uL   Monocytes Relative 9 %   Monocytes Absolute 0.8 0 - 1 K/uL   Eosinophils Relative 5 %   Eosinophils Absolute 0.4 0 - 0 K/uL   Basophils Relative 0 %   Basophils Absolute 0.0 0 - 0 K/uL   Immature Granulocytes 1 %   Abs Immature Granulocytes 0.05 0.00 - 0.07 K/uL    Comment: Performed at [facility], [address] [place], [place] [address]  Protime-INR     Status: None   Collection Time: 01/11/20  1:20 PM  Result Value Ref Range   Prothrombin Time 13.5 11.4 - 15.2 seconds   INR 1.1 0.8 - 1.2    Comment: (NOTE) INR goal varies based on device and disease states. Performed at [facility], [address], [place], [place] [address]   SARS Coronavirus 2 by RT PCR (hospital order, performed in [facility] hospital lab) Nasopharyngeal Nasopharyngeal Swab     Status: None   Collection Time: 01/11/20  6:26 PM   Specimen: Nasopharyngeal Swab  Result Value Ref Range   SARS Coronavirus 2 NEGATIVE NEGATIVE    Comment: (NOTE) SARS-CoV-2 target nucleic acids are NOT DETECTED.  The SARS-CoV-2 RNA is generally detectable in upper and lower respiratory specimens during the acute phase of infection. The lowest concentration of SARS-CoV-2 viral copies this assay can detect is 250 copies / mL. A negative result does not preclude SARS-CoV-2 infection and should not be used as the sole basis for treatment or other patient management decisions.  A negative result may  occur with improper specimen collection / handling, submission of specimen other than nasopharyngeal swab, presence of viral mutation(s) within the areas targeted by this assay, and inadequate number of viral copies (<250 copies /  mL). A negative result must be combined with clinical observations, patient history, and epidemiological information.  Fact Sheet for Patients:   BoilerBrush.com.cy  Fact Sheet for Healthcare Providers: https://pope.com/  This test is not yet approved or  cleared by the [place] FDA and has been authorized for detection and/or diagnosis of SARS-CoV-2 by FDA under an Emergency Use Authorization (EUA).  This EUA will remain in effect (meaning this test can be used) for the duration of the COVID-19 declaration under Section 564(b)(1) of the Act, 21 U.S.C. section 360bbb-3(b)(1), unless the authorization is terminated or revoked sooner.  Performed at [facility], [address] [place], [place] [address]   CBG monitoring, ED     Status: Abnormal   Collection Time: 01/11/20  8:03 PM  Result Value Ref Range   Glucose-Capillary 240 (H) 70 - 99 mg/dL    Comment: Glucose reference range applies only to samples taken after fasting for at least 8 hours.    DG Chest 2 View  Result Date: 01/11/2020 CLINICAL DATA:  Weakness. Status post fall in the bathroom. EXAM: CHEST - 2 VIEW COMPARISON:  CT of the chest June 20, 2018 FINDINGS: Cardiomediastinal silhouette is normal. Mediastinal contours appear intact. Airspace consolidation versus atelectasis in the left lung base. Osseous structures are without acute abnormality. Soft tissues are grossly normal. IMPRESSION: Airspace consolidation versus atelectasis in the left lung base. Electronically Signed   By: Ted Mcalpine M.D.   On: 01/11/2020 12:04   DG Wrist Complete Right  Result Date: 01/11/2020 CLINICAL DATA:  Fall, right wrist pain  EXAM: RIGHT WRIST - COMPLETE 3+ VIEW COMPARISON:  None. FINDINGS: Four view radiograph right wrist demonstrates a comminuted fracture of the distal right radius with impaction of the distal radial articular surface with fracture planes identified involving the radial, volar aspect of the radial styloid as well as the probable ulnar aspect of the distal radius involving the lunate fossa. Radiocarpal articulation is preserved. There is preserved mild volar inclination of the distal radial articular Osseous structures are mildly osteopenic. Erosions are seen within the distal scaphoid and lunate which are likely degenerative in nature. Degenerative chondrocalcinosis is seen involving the TFCC. There is moderate degenerate arthritis involving the triscaphe joint. IMPRESSION: Impacted intra-articular fracture of the distal radius with minimal impaction of the distal radial articular surface. Fracture fragments appear anatomically aligned. Electronically Signed   By: Helyn Numbers MD   On: 01/11/2020 15:16   CT Head Wo Contrast  Result Date: 01/11/2020 CLINICAL DATA:  Larey Seat today, bleeding from top of head, neck pain, no loss of consciousness, history diabetes mellitus EXAM: CT HEAD WITHOUT CONTRAST CT CERVICAL SPINE WITHOUT CONTRAST TECHNIQUE: Multidetector CT imaging of the head and cervical spine was performed following the standard protocol without intravenous contrast. Multiplanar CT image reconstructions of the cervical spine were also generated. COMPARISON:  06/20/2018 FINDINGS: CT HEAD FINDINGS Brain: Generalized atrophy. Normal ventricular morphology. No midline shift or mass effect. Mild small vessel chronic ischemic changes of deep cerebral white matter. Small old lacunar infarct at LEFT thalamus. No intracranial hemorrhage, mass lesion or evidence of acute infarction. No extra-axial fluid collections. Vascular: Atherosclerotic calcification of internal carotid arteries at skull base Skull: Small RIGHT  frontal scalp hematoma.  Calvaria intact. Sinuses/Orbits: Clear Other: N/A CT CERVICAL SPINE FINDINGS Alignment: Minimal anterolisthesis at C6-C7 unchanged. Remaining alignments normal Skull base and vertebrae: Osseous demineralization. Multilevel facet degenerative changes cervical spine. Disc space narrowing and endplate spur formation at C7-T1 and  T1-T2 as well as C6-C7. Vertebral body heights maintained. Visualized skull base intact. Incomplete posterior arch C1, developmental anomaly. No fracture, additional subluxation or bone destruction. Soft tissues and spinal canal: Prevertebral soft tissues normal thickness. Atherosclerotic calcifications at carotid bifurcations. 1.6 cm LEFT thyroid nodule at unchanged; recommend thyroid ultrasound if clinically indicated based on patient age and comorbidities. (Ref: J Am Coll Radiol. 2015 Feb;12(2): 143-50). Disc levels:  No additional abnormalities Upper chest: Lung apices clear Other: N/A IMPRESSION: Atrophy with small vessel chronic ischemic changes of deep cerebral white matter. 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 No acute intracranial abnormalities. Multilevel degenerative disc and facet disease changes of the cervical spine. No acute cervical spine abnormalities. Unchanged 1.6 cm diameter LEFT thyroid nodule; recommend thyroid ultrasound if clinically indicated based on patient age and comorbidities as above. Electronically Signed   By: Ulyses Southward M.D.   On: 01/11/2020 12:20   CT Chest W Contrast  Result Date: 01/11/2020 CLINICAL DATA:  Ground level fall EXAM: CT CHEST WITH CONTRAST TECHNIQUE: Multidetector CT imaging of the chest was performed during intravenous contrast administration. CONTRAST:  1mL OMNIPAQUE IOHEXOL 300 MG/ML  SOLN COMPARISON:  Radiograph 01/11/2020, CTA 06/20/2018 FINDINGS: Cardiovascular: The aortic root is suboptimally assessed given cardiac pulsation artifact. Atherosclerotic plaque within the normal caliber aorta. No acute luminal  abnormality nor periaortic stranding or hemorrhage. Minimal atheromatous plaque in the otherwise unremarkable great vessels. Left dominant vertebral arteries. Central pulmonary arteries are normal caliber. No large central or lobar filling defects on this limited, non tailored evaluation of the pulmonary artery vasculature. Normal heart size. No pericardial effusion. Calcifications upon the aortic leaflets and mitral annulus as well as three-vessel coronary artery calcification. No major venous abnormality. Mediastinum/Nodes: No mediastinal fluid or gas. No acute abnormality of the trachea. Small hiatal hernia without acute abnormality of the esophagus. No worrisome mediastinal, hilar or axillary adenopathy. Diminishing size of a now 10 mm (previously 12 mm), hypoattenuating Left thyroid normal trouble with punctate calcification in the left lobe thyroid gland, possible colloid cyst, no further imaging is warranted in a patient of this age. This follows consensus guidelines: Managing Incidental Thyroid Nodules Detected on Imaging: White Paper of the ACR Incidental Thyroid Findings Committee. J Am Coll Radiol 2015; 12:143-150. and [facility] 3-tiered system for managing ITNs: J Am Coll Radiol. 2015; Feb;12(2): 143-50 Lungs/Pleura: No pneumothorax, effusion or traumatic abnormality of the lung parenchyma. No consolidation or edema. Dependent atelectasis posteriorly. No suspicious pulmonary nodules or masses. Upper Abdomen: Small hiatal hernia, as above. Partial fatty replacement of the pancreas. Calcified gallstone towards the neck of the otherwise unremarkable gallbladder. Small fluid attenuation cyst in the posterior left lobe liver. Accessory splenule. No acute or worrisome upper abdominal findings. Musculoskeletal: No large body wall hematoma. Acute right fifth through ninth lateral rib fractures. Remote deformity of the left fifth rib. No acute vertebral body fracture or height loss. Anterior wedging of the T11 and T12  vertebral body similar to comparison. Dextrocurvature of the thoracolumbar spine with an apex at the T11 level. IMPRESSION: 1. Acute right fifth through ninth lateral rib fractures. No pneumothorax, effusion, or traumatic abnormality of the lung parenchyma. 2. No other acute traumatic injury identified in the chest. Mild atelectatic change, possibly related to splinting. 3. Remote deformity of the left fifth rib. 4. Cholelithiasis. 5. Aortic Atherosclerosis (ICD10-I70.0). 6. Coronary artery calcifications are present. Please note that the presence of coronary artery calcium documents the presence of coronary artery disease, the severity of this disease and  any potential stenosis cannot be assessed on this non-gated CT examination. Electronically Signed   By: Kreg ShropshirePrice  DeHay M.D.   On: 01/11/2020 15:57   CT Cervical Spine Wo Contrast  Result Date: 01/11/2020 CLINICAL DATA:  Larey SeatFell today, bleeding from top of head, neck pain, no loss of consciousness, history diabetes mellitus EXAM: CT HEAD WITHOUT CONTRAST CT CERVICAL SPINE WITHOUT CONTRAST TECHNIQUE: Multidetector CT imaging of the head and cervical spine was performed following the standard protocol without intravenous contrast. Multiplanar CT image reconstructions of the cervical spine were also generated. COMPARISON:  06/20/2018 FINDINGS: CT HEAD FINDINGS Brain: Generalized atrophy. Normal ventricular morphology. No midline shift or mass effect. Mild small vessel chronic ischemic changes of deep cerebral white matter. Small old lacunar infarct at LEFT thalamus. No intracranial hemorrhage, mass lesion or evidence of acute infarction. No extra-axial fluid collections. Vascular: Atherosclerotic calcification of internal carotid arteries at skull base Skull: Small RIGHT frontal scalp hematoma.  Calvaria intact. Sinuses/Orbits: Clear Other: N/A CT CERVICAL SPINE FINDINGS Alignment: Minimal anterolisthesis at C6-C7 unchanged. Remaining alignments normal Skull base and  vertebrae: Osseous demineralization. Multilevel facet degenerative changes cervical spine. Disc space narrowing and endplate spur formation at C7-T1 and T1-T2 as well as C6-C7. Vertebral body heights maintained. Visualized skull base intact. Incomplete posterior arch C1, developmental anomaly. No fracture, additional subluxation or bone destruction. Soft tissues and spinal canal: Prevertebral soft tissues normal thickness. Atherosclerotic calcifications at carotid bifurcations. 1.6 cm LEFT thyroid nodule at unchanged; recommend thyroid ultrasound if clinically indicated based on patient age and comorbidities. (Ref: J Am Coll Radiol. 2015 Feb;12(2): 143-50). Disc levels:  No additional abnormalities Upper chest: Lung apices clear Other: N/A IMPRESSION: Atrophy with small vessel chronic ischemic changes of deep cerebral white matter. 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 No acute intracranial abnormalities. Multilevel degenerative disc and facet disease changes of the cervical spine. No acute cervical spine abnormalities. Unchanged 1.6 cm diameter LEFT thyroid nodule; recommend thyroid ultrasound if clinically indicated based on patient age and comorbidities as above. Electronically Signed   By: Ulyses SouthwardMark  Boles M.D.   On: 01/11/2020 12:20    Review of Systems Blood pressure 116/63, pulse 99, temperature 97.6 F (36.4 C), temperature source Oral, resp. rate 16, SpO2 95 %. Physical Exam Right wrist fracture x-rays show a minimally displaced fracture about the distal radius.  This is not a surgical fracture at this time unless it moves out of place.  He is in a sugar tong splint and stable at this juncture.  I reviewed these issues at length and performed a comprehensive chart review.  Patient has significant rib fractures which will require admission.   Assessment/Plan: Right wrist fracture patient is currently immobilized in a sugar tong and stable  I Ernie HewMinna go ahead and plan for observation.  I would not  recommend any surgical fixation.  This is minimally displaced and in this gentleman I would recommend continued conservative management algorithm.  Will be board for any issues problems or concerns.  Dionne AnoWilliam M Houa Ackert III 01/11/2020, 9:00 PM

## 2020-01-11 NOTE — ED Provider Notes (Signed)
[facility]-EMERGENCY DEPT Provider Note   CSN: [id number] Arrival date & time: 01/11/20  1038     History Chief Complaint  Patient presents with  . Head Laceration    Matthew Calderon is a 86 y.o. male.  HPI Patient reports he has significant problem with imbalance and unsteady gait.  He uses a walker to assist.  He reports sometimes, his legs just give out and he collapses.  He reports this morning he fell straight forward and hit his head on the tiled bathroom floor.  Patient denies that he got knocked out.  He reports he has some pain around the site where he got hit but does not have any generalized headache.  He does take a daily aspirin.  Patient reports he also has some pain in his upper to mid thoracic back and right posterior chest.  He reports it does hurt to twist and to take a deep breath.  He does not feel short of breath.  He thinks he "strained a muscle when he fell".  He does report he has had some persistent coughing for about a month now.  He has not been having fevers or chills.  He is otherwise felt well at baseline aside from this problem with gait instability.    Past Medical History:  Diagnosis Date  . Anemia    takes iron pill  . Anxiety   . Arthritis   . Depression   . Diabetes mellitus without complication (HCC)   . GERD (gastroesophageal reflux disease)    pepto  . H/O hiatal hernia   . Pneumonia    4 years ago  . PONV (postoperative nausea and vomiting) 05/14/2019  . Post-nasal drip   . Prostate hyperplasia, benign localized, without urinary obstruction   . Seasonal allergies   . Sleep apnea    no CPAP    Patient Active Problem List   Diagnosis Date Noted  . Arthritis 05/14/2019  . Enlarged prostate 05/14/2019  . History of tobacco abuse 05/14/2019  . Interstitial cystitis 05/14/2019  . Obesity 05/14/2019  . OSA (obstructive sleep apnea) 05/14/2019  . PONV (postoperative nausea and vomiting) 05/14/2019  . Post-nasal drip  05/14/2019  . Seasonal allergies 05/14/2019  . Chronic cough 07/21/2018  . GERD (gastroesophageal reflux disease) 07/21/2018  . NSTEMI (non-ST elevated myocardial infarction) (HCC) 06/20/2018  . Prostate hyperplasia, benign localized, without urinary obstruction   . Hyperglycemia   . Syncope, cardiogenic   . Degenerative joint disease (DJD) of hip 01/13/2014  . Diabetes (HCC) 01/13/2014  . Pseudophakia, both eyes 12/01/2013  . Nonexudative age-related macular degeneration 01/16/2013  . Primary open angle glaucoma of right eye, severe stage 01/13/2013  . Allergic rhinitis 09/17/2010  . Anemia, unspecified 09/17/2010  . Gastro-esophageal reflux disease with esophagitis 12/27/2009  . Other and unspecified hyperlipidemia 12/27/2009  . Hypertrophy of prostate without urinary obstruction and other lower urinary tract symptoms (LUTS) 07/11/2009  . Insomnia 07/11/2009  . Osteoporosis 07/11/2009  . Other malaise and fatigue 06/15/2008  . Depressive disorder, not elsewhere classified 05/04/2008  . Unspecified glaucoma 05/04/2008    Past Surgical History:  Procedure Laterality Date  . BACK SURGERY  1995   discectomy  . COLONOSCOPY W/ BIOPSIES AND POLYPECTOMY    . EYE SURGERY Bilateral    cataracts  . EYE SURGERY Bilateral    glaucoma shunts  . JOINT REPLACEMENT Left 1992   knee  . JOINT REPLACEMENT Right 1996   knee  . JOINT REPLACEMENT  Right 2009   hip  . JOINT REPLACEMENT Left 2005   hip  . KNEE ARTHROSCOPY Left 1981  . KNEE ARTHROSCOPY WITH PATELLA RECONSTRUCTION Right 2010  . KNEE SURGERY Bilateral 1954   knee surgery and placed in a cast for 6 weeks  . KNEE SURGERY  1961   cartilage removed  . LEG SURGERY Bilateral 1974, 1975   straighten legs  . SHOULDER OPEN ROTATOR CUFF REPAIR Right 2010  . TONSILLECTOMY    . TOTAL HIP ARTHROPLASTY Right 01/13/2014   Procedure: TOTAL HIP ARTHROPLASTY ANTERIOR APPROACH;  Surgeon: Velna Ochs, MD;  Location: [facility] OR;  Service:  Orthopedics;  Laterality: Right;       Family History  Problem Relation Age of Onset  . Arthritis Mother   . Arthritis Father     Social History   Tobacco Use  . Smoking status: Former Smoker    Packs/day: 1.00    Years: 18.00    Pack years: 18.00    Types: Cigarettes    Quit date: 1978    Years since quitting: 43.5  . Smokeless tobacco: Never Used  . Tobacco comment: stopped 45 years ago  Vaping Use  . Vaping Use: Never used  Substance Use Topics  . Alcohol use: Yes    Comment: social  . Drug use: No    Home Medications Prior to Admission medications   Medication Sig Start Date End Date Taking? Authorizing Provider  acetaminophen (TYLENOL) 325 MG tablet Take 2 tablets (650 mg total) by mouth every 6 (six) hours as needed for mild pain or fever. 06/26/18   Elgergawy, Leana Roe, MD  Aspirin Buf,CaCarb-MgCarb-MgO, 81 MG TABS Take by mouth.    [provider]  aspirin EC 81 MG EC tablet Take 1 tablet (81 mg total) by mouth daily. 06/27/18   Elgergawy, Leana Roe, MD  atorvastatin (LIPITOR) 40 MG tablet Take 1 tablet (40 mg total) by mouth daily at 6 PM. 06/26/18   Elgergawy, Leana Roe, MD  brimonidine (ALPHAGAN) 0.2 % ophthalmic solution Place 1 drop into both eyes 2 (two) times daily. 09/22/17   [provider]  brimonidine (ALPHAGAN) 0.2 % ophthalmic solution Place 1 drop into both eyes 2 times daily. 05/06/19   [provider]  carvedilol (COREG) 12.5 MG tablet Take 1 tablet (12.5 mg total) by mouth 2 (two) times daily with a meal. 06/26/18   Elgergawy, Leana Roe, MD  Cholecalciferol 25 MCG (1000 UT) tablet Take 2,000 Units by mouth every morning.     [provider]  Ferrous Sulfate 143 (45 FE) MG TBCR Take 1 tablet by mouth daily.    [provider]  finasteride (PROSCAR) 5 MG tablet Take 5 mg by mouth daily. 03/11/19   [provider]  furosemide (LASIX) 40 MG tablet Take 1 tablet (40 mg total) by mouth daily. 06/27/18   Elgergawy,  Leana Roe, MD  guaiFENesin (MUCINEX) 600 MG 12 hr tablet Take 1 tablet (600 mg total) by mouth 2 (two) times daily. 06/26/18   Elgergawy, Leana Roe, MD  insulin aspart (NOVOLOG) 100 UNIT/ML injection Inject 0-15 Units into the skin 3 (three) times daily with meals. 06/26/18   Elgergawy, Leana Roe, MD  insulin glargine (LANTUS) 100 UNIT/ML injection Inject 0.12 mLs (12 Units total) into the skin daily. 06/26/18   Elgergawy, Leana Roe, MD  Melatonin 10 MG TBCR Take 1 tablet by mouth at bedtime.    [provider]  metFORMIN (GLUCOPHAGE-XR) 500 MG 24  hr tablet Take 1,500 mg by mouth daily with breakfast.    [provider]  methocarbamol (ROBAXIN) 500 MG tablet Take 1 tablet (500 mg total) by mouth every 6 (six) hours as needed for muscle spasms. Patient not taking: Reported on 06/20/2018 01/14/14   Elodia Florence, PA-C  Multiple Vitamins-Minerals (PRESERVISION AREDS 2) CAPS Take 2 capsules by mouth 2 (two) times daily.    [provider]  MYRBETRIQ 50 MG TB24 tablet Take 50 mg by mouth daily. 03/23/18   [provider]  nystatin (MYCOSTATIN/NYSTOP) powder Apply 1 Bottle topically 2 (two) times daily.    [provider]  pantoprazole (PROTONIX) 40 MG tablet Take 1 tablet (40 mg total) by mouth daily. 06/27/18   Elgergawy, Leana Roe, MD  potassium chloride (K-DUR,KLOR-CON) 10 MEQ tablet Take 1 tablet (10 mEq total) by mouth daily. 06/27/18   Elgergawy, Leana Roe, MD  sacubitril-valsartan (ENTRESTO) 24-26 MG Take 1 tablet by mouth 2 (two) times daily. 06/26/18   Elgergawy, Leana Roe, MD  SYNJARDY XR 25-1000 MG TB24  04/22/19   [provider]  tamsulosin (FLOMAX) 0.4 MG CAPS capsule Take 0.4 mg by mouth daily. 03/19/18   [provider]  timolol (TIMOPTIC) 0.5 % ophthalmic solution Place 1 drop into both eyes 2 (two) times daily. 09/22/17   [provider]  traZODone (DESYREL) 50 MG tablet Take 50 mg by mouth at bedtime. 03/19/18   [provider]     Allergies    Penicillins and Sulfa antibiotics  Review of Systems   Review of Systems 10 systems reviewed and negative except for HPI Physical Exam Updated Vital Signs BP (!) 160/86 (BP Location: Left Arm)   Pulse 87   Temp 97.6 F (36.4 C) (Oral)   Resp 17   SpO2 98%   Physical Exam Constitutional:      Comments: Alert with clear mental status.  No acute distress.  No respiratory distress.  GCS 15  HENT:     Head:     Comments: 4-1/2 cm laceration to the temple on the right from the corner of the eyebrow to just the top of the zygoma.  This is gaping with active bleeding.  Ecchymosis moderate swelling around the right eye.  Nose and mouth normal without injury or nasal bleeding. Eyes:     Extraocular Movements: Extraocular movements intact.     Conjunctiva/sclera: Conjunctivae normal.     Pupils: Pupils are equal, round, and reactive to light.     Comments: No subconjunctival hematoma.  Normal extraocular motions.  Normal pupillary responses.  Neck:     Comments: Patient maintained in cervical collar until completion of CT-spine series. Cardiovascular:     Rate and Rhythm: Normal rate and regular rhythm.     Pulses: Normal pulses.     Heart sounds: Normal heart sounds.  Pulmonary:     Comments: Breath sounds are symmetric.  Do not appreciate gross rales or rhonchi.  Patient denies chest wall compression.  No crepitus.  He does endorse discomfort to palpation of the thoracic spine from about T4-T6 and somewhat to the right paraspinous. Abdominal:     General: There is no distension.     Palpations: Abdomen is soft.     Tenderness: There is no abdominal tenderness. There is no guarding.  Musculoskeletal:        General: No swelling or tenderness. Normal range of motion.     Right lower leg: No edema.     Left  lower leg: No edema.  Skin:    General: Skin is warm and dry.  Neurological:     General: No focal deficit present.     Mental Status: He is oriented to  person, place, and time.     Cranial Nerves: No cranial nerve deficit.     Coordination: Coordination normal.  Psychiatric:        Mood and Affect: Mood normal.     ED Results / Procedures / Treatments   Labs (all labs ordered are listed, but only abnormal results are displayed) Labs Reviewed  BASIC METABOLIC PANEL - Abnormal; Notable for the following components:      Result Value   CO2 21 (*)    Glucose, Bld 137 (*)    Calcium 8.4 (*)    All other components within normal limits  CBG MONITORING, ED - Abnormal; Notable for the following components:   Glucose-Capillary 126 (*)    All other components within normal limits  CBC WITH DIFFERENTIAL/PLATELET  PROTIME-INR    EKG None  Radiology DG Chest 2 View  Result Date: 01/11/2020 CLINICAL DATA:  Weakness. Status post fall in the bathroom. EXAM: CHEST - 2 VIEW COMPARISON:  CT of the chest June 20, 2018 FINDINGS: Cardiomediastinal silhouette is normal. Mediastinal contours appear intact. Airspace consolidation versus atelectasis in the left lung base. Osseous structures are without acute abnormality. Soft tissues are grossly normal. IMPRESSION: Airspace consolidation versus atelectasis in the left lung base. Electronically Signed   By: Ted Mcalpine M.D.   On: 01/11/2020 12:04   DG Wrist Complete Right  Result Date: 01/11/2020 CLINICAL DATA:  Fall, right wrist pain EXAM: RIGHT WRIST - COMPLETE 3+ VIEW COMPARISON:  None. FINDINGS: Four view radiograph right wrist demonstrates a comminuted fracture of the distal right radius with impaction of the distal radial articular surface with fracture planes identified involving the radial, volar aspect of the radial styloid as well as the probable ulnar aspect of the distal radius involving the lunate fossa. Radiocarpal articulation is preserved. There is preserved mild volar inclination of the distal radial articular Osseous structures are mildly osteopenic. Erosions are seen within  the distal scaphoid and lunate which are likely degenerative in nature. Degenerative chondrocalcinosis is seen involving the TFCC. There is moderate degenerate arthritis involving the triscaphe joint. IMPRESSION: Impacted intra-articular fracture of the distal radius with minimal impaction of the distal radial articular surface. Fracture fragments appear anatomically aligned. Electronically Signed   By: Helyn Numbers MD   On: 01/11/2020 15:16   CT Head Wo Contrast  Result Date: 01/11/2020 CLINICAL DATA:  Larey Seat today, bleeding from top of head, neck pain, no loss of consciousness, history diabetes mellitus EXAM: CT HEAD WITHOUT CONTRAST CT CERVICAL SPINE WITHOUT CONTRAST TECHNIQUE: Multidetector CT imaging of the head and cervical spine was performed following the standard protocol without intravenous contrast. Multiplanar CT image reconstructions of the cervical spine were also generated. COMPARISON:  06/20/2018 FINDINGS: CT HEAD FINDINGS Brain: Generalized atrophy. Normal ventricular morphology. No midline shift or mass effect. Mild small vessel chronic ischemic changes of deep cerebral white matter. Small old lacunar infarct at LEFT thalamus. No intracranial hemorrhage, mass lesion or evidence of acute infarction. No extra-axial fluid collections. Vascular: Atherosclerotic calcification of internal carotid arteries at skull base Skull: Small RIGHT frontal scalp hematoma.  Calvaria intact. Sinuses/Orbits: Clear Other: N/A CT CERVICAL SPINE FINDINGS Alignment: Minimal anterolisthesis at C6-C7 unchanged. Remaining alignments normal Skull base and vertebrae: Osseous demineralization. Multilevel facet degenerative changes cervical spine.  Disc space narrowing and endplate spur formation at C7-T1 and T1-T2 as well as C6-C7. Vertebral body heights maintained. Visualized skull base intact. Incomplete posterior arch C1, developmental anomaly. No fracture, additional subluxation or bone destruction. Soft tissues and  spinal canal: Prevertebral soft tissues normal thickness. Atherosclerotic calcifications at carotid bifurcations. 1.6 cm LEFT thyroid nodule at unchanged; recommend thyroid ultrasound if clinically indicated based on patient age and comorbidities. (Ref: J Am Coll Radiol. 2015 Feb;12(2): 143-50). Disc levels:  No additional abnormalities Upper chest: Lung apices clear Other: N/A IMPRESSION: Atrophy with small vessel chronic ischemic changes of deep cerebral white matter. 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 No acute intracranial abnormalities. Multilevel degenerative disc and facet disease changes of the cervical spine. No acute cervical spine abnormalities. Unchanged 1.6 cm diameter LEFT thyroid nodule; recommend thyroid ultrasound if clinically indicated based on patient age and comorbidities as above. Electronically Signed   By: Ulyses Southward M.D.   On: 01/11/2020 12:20   CT Chest W Contrast  Result Date: 01/11/2020 CLINICAL DATA:  Ground level fall EXAM: CT CHEST WITH CONTRAST TECHNIQUE: Multidetector CT imaging of the chest was performed during intravenous contrast administration. CONTRAST:  75mL OMNIPAQUE IOHEXOL 300 MG/ML  SOLN COMPARISON:  Radiograph 01/11/2020, CTA 06/20/2018 FINDINGS: Cardiovascular: The aortic root is suboptimally assessed given cardiac pulsation artifact. Atherosclerotic plaque within the normal caliber aorta. No acute luminal abnormality nor periaortic stranding or hemorrhage. Minimal atheromatous plaque in the otherwise unremarkable great vessels. Left dominant vertebral arteries. Central pulmonary arteries are normal caliber. No large central or lobar filling defects on this limited, non tailored evaluation of the pulmonary artery vasculature. Normal heart size. No pericardial effusion. Calcifications upon the aortic leaflets and mitral annulus as well as three-vessel coronary artery calcification. No major venous abnormality. Mediastinum/Nodes: No mediastinal fluid or gas. No acute  abnormality of the trachea. Small hiatal hernia without acute abnormality of the esophagus. No worrisome mediastinal, hilar or axillary adenopathy. Diminishing size of a now 10 mm (previously 12 mm), hypoattenuating Left thyroid normal trouble with punctate calcification in the left lobe thyroid gland, possible colloid cyst, no further imaging is warranted in a patient of this age. This follows consensus guidelines: Managing Incidental Thyroid Nodules Detected on Imaging: White Paper of the ACR Incidental Thyroid Findings Committee. J Am Coll Radiol 2015; 12:143-150. and [employer] 3-tiered system for managing ITNs: J Am Coll Radiol. 2015; Feb;12(2): 143-50 Lungs/Pleura: No pneumothorax, effusion or traumatic abnormality of the lung parenchyma. No consolidation or edema. Dependent atelectasis posteriorly. No suspicious pulmonary nodules or masses. Upper Abdomen: Small hiatal hernia, as above. Partial fatty replacement of the pancreas. Calcified gallstone towards the neck of the otherwise unremarkable gallbladder. Small fluid attenuation cyst in the posterior left lobe liver. Accessory splenule. No acute or worrisome upper abdominal findings. Musculoskeletal: No large body wall hematoma. Acute right fifth through ninth lateral rib fractures. Remote deformity of the left fifth rib. No acute vertebral body fracture or height loss. Anterior wedging of the T11 and T12 vertebral body similar to comparison. Dextrocurvature of the thoracolumbar spine with an apex at the T11 level. IMPRESSION: 1. Acute right fifth through ninth lateral rib fractures. No pneumothorax, effusion, or traumatic abnormality of the lung parenchyma. 2. No other acute traumatic injury identified in the chest. Mild atelectatic change, possibly related to splinting. 3. Remote deformity of the left fifth rib. 4. Cholelithiasis. 5. Aortic Atherosclerosis (ICD10-I70.0). 6. Coronary artery calcifications are present. Please note that the presence of coronary  artery calcium documents the  presence of coronary artery disease, the severity of this disease and any potential stenosis cannot be assessed on this non-gated CT examination. Electronically Signed   By: Kreg ShropshirePrice  DeHay M.D.   On: 01/11/2020 15:57   CT Cervical Spine Wo Contrast  Result Date: 01/11/2020 CLINICAL DATA:  Larey SeatFell today, bleeding from top of head, neck pain, no loss of consciousness, history diabetes mellitus EXAM: CT HEAD WITHOUT CONTRAST CT CERVICAL SPINE WITHOUT CONTRAST TECHNIQUE: Multidetector CT imaging of the head and cervical spine was performed following the standard protocol without intravenous contrast. Multiplanar CT image reconstructions of the cervical spine were also generated. COMPARISON:  06/20/2018 FINDINGS: CT HEAD FINDINGS Brain: Generalized atrophy. Normal ventricular morphology. No midline shift or mass effect. Mild small vessel chronic ischemic changes of deep cerebral white matter. Small old lacunar infarct at LEFT thalamus. No intracranial hemorrhage, mass lesion or evidence of acute infarction. No extra-axial fluid collections. Vascular: Atherosclerotic calcification of internal carotid arteries at skull base Skull: Small RIGHT frontal scalp hematoma.  Calvaria intact. Sinuses/Orbits: Clear Other: N/A CT CERVICAL SPINE FINDINGS Alignment: Minimal anterolisthesis at C6-C7 unchanged. Remaining alignments normal Skull base and vertebrae: Osseous demineralization. Multilevel facet degenerative changes cervical spine. Disc space narrowing and endplate spur formation at C7-T1 and T1-T2 as well as C6-C7. Vertebral body heights maintained. Visualized skull base intact. Incomplete posterior arch C1, developmental anomaly. No fracture, additional subluxation or bone destruction. Soft tissues and spinal canal: Prevertebral soft tissues normal thickness. Atherosclerotic calcifications at carotid bifurcations. 1.6 cm LEFT thyroid nodule at unchanged; recommend thyroid ultrasound if clinically  indicated based on patient age and comorbidities. (Ref: J Am Coll Radiol. 2015 Feb;12(2): 143-50). Disc levels:  No additional abnormalities Upper chest: Lung apices clear Other: N/A IMPRESSION: Atrophy with small vessel chronic ischemic changes of deep cerebral white matter. 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 No acute intracranial abnormalities. Multilevel degenerative disc and facet disease changes of the cervical spine. No acute cervical spine abnormalities. Unchanged 1.6 cm diameter LEFT thyroid nodule; recommend thyroid ultrasound if clinically indicated based on patient age and comorbidities as above. Electronically Signed   By: Ulyses SouthwardMark  Boles M.D.   On: 01/11/2020 12:20    Procedures .[name].Laceration Repair  Date/Time: 01/11/2020 2:04 PM Performed by: Arby BarrettePfeiffer, Kdyn Vonbehren, MD Authorized by: Arby BarrettePfeiffer, Yarielis Funaro, MD   Consent:    Consent obtained:  Verbal   Consent given by:  Patient   Risks discussed:  Infection, pain, poor cosmetic result, poor wound healing and nerve damage Anesthesia (see MAR for exact dosages):    Anesthesia method:  Local infiltration   Local anesthetic:  Lidocaine 2% WITH epi Laceration details:    Location:  Face   Face location:  R eyebrow   Length (cm):  4.5   Depth (mm):  6 Repair type:    Repair type:  Complex Pre-procedure details:    Preparation:  Patient was prepped and draped in usual sterile fashion Exploration:    Limited defect created (wound extended): no     Hemostasis achieved with:  Epinephrine, tied off vessels and direct pressure   Wound exploration: entire depth of wound probed and visualized     Wound extent: areolar tissue violated     Contaminated: no   Treatment:    Area cleansed with:  Saline   Amount of cleaning:  Standard   Irrigation solution:  Sterile saline   Irrigation volume:  80   Irrigation method:  Syringe   Visualized foreign bodies/material removed: no     Debridement:  Minimal   Undermining:  None Subcutaneous repair:     Suture size:  4-0   Suture material:  Vicryl   Number of sutures:  3 Skin repair:    Repair method:  Sutures   Suture size:  5-0   Suture material:  Nylon   Suture technique:  Simple interrupted   Number of sutures:  10 Approximation:    Approximation:  Close Post-procedure details:    Dressing:  Bulky dressing   Patient tolerance of procedure:  Tolerated well, no immediate complications Comments:     Wound cleaned and hematoma removed.  Minor revision of extraneous thin piece of skin.  3 subcu sutures placed 10 interrupted sutures placed.  Patient continued to have limited oozing of blood after completion of closure.  4 x 4 gauze and Coban compression dressing applied.  Patient tolerated well.   (including critical care time) CRITICAL CARE Performed by: Arby Barrette   Total critical care time: 30 minutes  Critical care time was exclusive of separately billable procedures and treating other patients.  Critical care was necessary to treat or prevent imminent or life-threatening deterioration.  Critical care was time spent personally by me on the following activities: development of treatment plan with patient and/or surrogate as well as nursing, discussions with consultants, evaluation of patient's response to treatment, examination of patient, obtaining history from patient or surrogate, ordering and performing treatments and interventions, ordering and review of laboratory studies, ordering and review of radiographic studies, pulse oximetry and re-evaluation of patient's condition. Medications Ordered in ED Medications  sodium chloride (PF) 0.9 % injection (has no administration in time range)  morphine 4 MG/ML injection 4 mg (4 mg Intravenous Given 01/11/20 1651)  lidocaine-EPINEPHrine (XYLOCAINE W/EPI) 2 %-1:200000 (PF) injection 10 mL (10 mLs Infiltration Given 01/11/20 1144)  iohexol (OMNIPAQUE) 300 MG/ML solution 75 mL (75 mLs Intravenous Contrast Given 01/11/20 1516)    ED  Course  I have reviewed the triage vital signs and the nursing notes.  Pertinent labs & imaging results that were available during my care of the patient were reviewed by me and considered in my medical decision making (see chart for details).    MDM Rules/Calculators/A&P                         Consult: Reviewed with Dr. Ezzard Standing surgery.  He will review with trauma service for anticipated transfer to [facility] for observation.  Patient presents with a fall as outlined.  Patient reports he does have instability and has had other falls as well.  Patient had a significant laceration to his right temple.  CT does not show any intracranial bleeding.  Laceration repaired.  Patient has periorbital hematoma but normal extraocular motions with no evidence of orbit trauma.  Patient has 5 right rib fractures.  He has pain with movement.  No hypoxia at this time.  Will start pain control with IV morphine.  CT scan does not show hepatic injury or hemothorax.  Patient also has nondisplaced right wrist fracture.  Will splint.  Stable for outpatient orthopedic follow-up.  Patient is a resident at [facility] assisted living. Savage Noris is liaison and can assist in increasing the patient's level of care for return to facility.  Contact number [phone].  Patient son-in-law, Claudell Kyle, is contacted and has been informed of findings and plan for anticipated admission.  Contact [phone].    Final Clinical Impression(s) / ED Diagnoses Final diagnoses:  Closed fracture of multiple  ribs of right side, initial encounter  Laceration of forehead, initial encounter  Periorbital hematoma of right eye  Right wrist fracture, closed, initial encounter  Gait instability    Rx / DC Orders ED Discharge Orders    None       Arby Barrette, MD 01/11/20 1704

## 2020-01-11 NOTE — ED Notes (Signed)
Ortho tech consulted to apply short arm splint 

## 2020-01-11 NOTE — ED Triage Notes (Signed)
Pt BIB EMS from [facility]. Pt had a ground level fall in bathroom, accidentally tripping over feet. Pt fell face down on tile floor. Pt denies LOC. Pt denies dizziness. Pt has lac above right eyebrow. Bleeding controlled. Pt takes aspirin everyday. Pt c/o right wrist pain, right flank pain.   139/74 76 HR 95% RA CBG 162 97.7

## 2020-01-11 NOTE — H&P (Addendum)
Matthew Calderon is an 86 y.o. male.   Chief Complaint: Fall HPI: The patient is a 86 yr old man who lives in an assisted living situation. Ordinarily he uses a walker for ambulation. He was in the bathroom today and was getting ready to shave when he fell falling flat on his face. The patient has denies loss of consciousness to triage and myself. However, Dr. Donnald Calderon has documented that he was "knocked out".   The patient presented with sever ecchymoses about his head and a laceration of his forehead. The patient does take an 81 mg EC ASA at home, but no anticoagulants. CT of this head and C-Spine was performed demonstrated no acute abnormalities in either the head or C-spine. CT chest has demonstrated acute fractures of the right 5-9th lateral ribs. It has also documented calcium in the coronary arteries consistent with coronary artery disease.  Trauma surgery was called by ED physician. Ideally the patient would be transferred to [facility] where trauma services are available. However, no [facility] beds are currently available. He will be admitted to [facility] and stay here awaiting a bed there. General surgery will round on him at [facility] if the patient is still here.   The patient's vitals have remained stable in the ED. CBC and BMP are within normal limits.  The patient admits to pain in his head and in his chest. He states that he was recently given IV morphine, but that it hasn't helped much.   He denies fevers, chills, nausea, vomiting, loss of consciousness, visual changes, neurological changes, constipation, diarrhea, lesions, and sores.  He admits to chronic cough. He is having head pain and chest pain that is made worse with deep respirations. He also has baseline difficulties with balance.  The patient carries a past medical history significant for anxiety, depression, DM II, GERD, CHF, OSA (does n9ot wear CPAP), glaucoma, CAD, and anemia.  [facility] have been consulted to admit the patient for  further evaluation and treatment.  Past Medical History:  Diagnosis Date  . Anemia    takes iron pill  . Anxiety   . Arthritis   . Depression   . Diabetes mellitus without complication (HCC)   . GERD (gastroesophageal reflux disease)    pepto  . H/O hiatal hernia   . Pneumonia    4 years ago  . PONV (postoperative nausea and vomiting) 05/14/2019  . Post-nasal drip   . Prostate hyperplasia, benign localized, without urinary obstruction   . Seasonal allergies   . Sleep apnea    no CPAP    Past Surgical History:  Procedure Laterality Date  . BACK SURGERY  1995   discectomy  . COLONOSCOPY W/ BIOPSIES AND POLYPECTOMY    . EYE SURGERY Bilateral    cataracts  . EYE SURGERY Bilateral    glaucoma shunts  . JOINT REPLACEMENT Left 1992   knee  . JOINT REPLACEMENT Right 1996   knee  . JOINT REPLACEMENT Right 2009   hip  . JOINT REPLACEMENT Left 2005   hip  . KNEE ARTHROSCOPY Left 1981  . KNEE ARTHROSCOPY WITH PATELLA RECONSTRUCTION Right 2010  . KNEE SURGERY Bilateral 1954   knee surgery and placed in a cast for 6 weeks  . KNEE SURGERY  1961   cartilage removed  . LEG SURGERY Bilateral 1974, 1975   straighten legs  . SHOULDER OPEN ROTATOR CUFF REPAIR Right 2010  . TONSILLECTOMY    . TOTAL HIP ARTHROPLASTY Right 01/13/2014  Procedure: TOTAL HIP ARTHROPLASTY ANTERIOR APPROACH;  Surgeon: Velna OchsPeter G Dalldorf, MD;  Location: [facility] OR;  Service: Orthopedics;  Laterality: Right;    Family History  Problem Relation Age of Onset  . Arthritis Mother   . Arthritis Father    Social History:  reports that he quit smoking about 43 years ago. His smoking use included cigarettes. He has a 18.00 pack-year smoking history. He has never used smokeless tobacco. He reports current alcohol use. He reports that he does not use drugs. (Not in a hospital admission)   Allergies:  Allergies  Allergen Reactions  . Penicillins Hives    DID THE REACTION INVOLVE: Swelling of the face/tongue/throat,  SOB, or low BP? No Sudden or severe rash/hives, skin peeling, or the inside of the mouth or nose? No Did it require medical treatment? No When did it last happen?childhood allergy If all above answers are "NO", may proceed with cephalosporin use.   . Sulfa Antibiotics Hives    Pertinent items noted in HPI and remainder of comprehensive ROS otherwise negative.   General appearance: alert, cooperative, appears stated age and with multiple ecchymoses about his head with a laceration on his right temple. Head: Normocephalic, without obvious abnormality, ecchymoses and laceration to right temple. Eyes: conjunctivae/corneas clear. PERRL, EOM's intact. Fundi benign., Black eyes bilaterally. Left eye is swollen shut. Throat: lips, mucosa, and tongue normal; teeth and gums normal Neck: no adenopathy, no carotid bruit, no JVD, supple, symmetrical, trachea midline and thyroid not enlarged, symmetric, no tenderness/mass/nodules Resp: No increased work of breathing. No wheezes, rales, or rhonchi. No tactile fremitus. Chest wall: right sided chest wall tenderness Cardio: regular rate and rhythm, S1, S2 normal, no murmur, click, rub or gallop GI: soft, non-tender; bowel sounds normal; no masses,  no organomegaly Extremities: extremities normal, atraumatic, no cyanosis or edema Pulses: 2+ and symmetric Skin: Skin color, texture, turgor normal. No rashes or lesions or with exception to ecchymoses noted about the patient's face Lymph nodes: Cervical, supraclavicular, and axillary nodes normal. Neurologic: Alert and oriented X 3, normal strength and tone. Normal symmetric reflexes. Normal coordination and gait   Results for orders placed or performed during the hospital encounter of 01/11/20 (from the past 48 hour(s))  POC CBG, ED     Status: Abnormal   Collection Time: 01/11/20 12:25 PM  Result Value Ref Range   Glucose-Capillary 126 (H) 70 - 99 mg/dL    Comment: Glucose reference range applies  only to samples taken after fasting for at least 8 hours.  Basic metabolic panel     Status: Abnormal   Collection Time: 01/11/20  1:20 PM  Result Value Ref Range   Sodium 142 135 - 145 mmol/L   Potassium 4.2 3.5 - 5.1 mmol/L   Chloride 108 98 - 111 mmol/L   CO2 21 (L) 22 - 32 mmol/L   Glucose, Bld 137 (H) 70 - 99 mg/dL    Comment: Glucose reference range applies only to samples taken after fasting for at least 8 hours.   BUN 23 8 - 23 mg/dL   Creatinine, Ser 4.090.91 0.61 - 1.24 mg/dL   Calcium 8.4 (L) 8.9 - 10.3 mg/dL   GFR calc non Af Amer >60 >60 mL/min   GFR calc Af Amer >60 >60 mL/min   Anion gap 13 5 - 15    Comment: Performed at [facility], [address] [place], [place] [address]  CBC with Differential     Status: None  Collection Time: 01/11/20  1:20 PM  Result Value Ref Range   WBC 8.9 4.0 - 10.5 K/uL   RBC 4.73 4.22 - 5.81 MIL/uL   Hemoglobin 13.9 13.0 - 17.0 g/dL   HCT 14.4 39 - 52 %   MCV 90.1 80.0 - 100.0 fL   MCH 29.4 26.0 - 34.0 pg   MCHC 32.6 30.0 - 36.0 g/dL   RDW 31.5 40.0 - 86.7 %   Platelets 168 150 - 400 K/uL   nRBC 0.0 0.0 - 0.2 %   Neutrophils Relative % 75 %   Neutro Abs 6.7 1.7 - 7.7 K/uL   Lymphocytes Relative 10 %   Lymphs Abs 0.9 0.7 - 4.0 K/uL   Monocytes Relative 9 %   Monocytes Absolute 0.8 0 - 1 K/uL   Eosinophils Relative 5 %   Eosinophils Absolute 0.4 0 - 0 K/uL   Basophils Relative 0 %   Basophils Absolute 0.0 0 - 0 K/uL   Immature Granulocytes 1 %   Abs Immature Granulocytes 0.05 0.00 - 0.07 K/uL    Comment: Performed at [facility], [address], [place], [place] [address]  Protime-INR     Status: None   Collection Time: 01/11/20  1:20 PM  Result Value Ref Range   Prothrombin Time 13.5 11.4 - 15.2 seconds   INR 1.1 0.8 - 1.2    Comment: (NOTE) INR goal varies based on device and disease states. Performed at [facility], [address], [place], [place] [address]     @[email or web address] @  Blood pressure (!) 160/86, pulse 87, temperature 97.6 F (36.4 C), temperature source Oral, resp. rate 17, SpO2 98 %.    Assessment/Plan Problem  Multiple Rib Fractures  Dysequilibrium  Fall At Home, Initial Encounter  Head Trauma, Subsequent Encounter  Enlarged Prostate  Osa (Obstructive Sleep Apnea)  Chronic Cough  Gerd (Gastroesophageal Reflux Disease)  Prostate Hyperplasia, Benign Localized, Without Urinary Obstruction  Syncope, Cardiogenic  Diabetes (Hcc)  Pseudophakia, Both Eyes  Primary Open Angle Glaucoma of Right Eye, Severe Stage  Anemia, Unspecified  Gastro-Esophageal Reflux Disease With Esophagitis   Fall with head trauma, Rib fractures, and head trauma: Due to chronic dysequilibrium. There is some conflicting reports about whether the patient had loss of consciousness or not. CT head and C-Spine were negative for acute pathology. The patient has multiple ecchymoses about his head with severe peri-orbital ecchymoses with the left eye swollen nearly shut. The patient is awake, alert, and oriented x 2. There are no neurological deficits or altered mental status. Trauma surgery to follow if patient is able to be transferred to [facility] and general surgery if he remains at [facility] due to lack of bed availability at [facility].  Fractures of right lateral ribs 5-9: the patient is currently saturating in the 90's on room air. He complains of some pain in the chest with deep respirations. I have ordered incentive spirometry. Pain control will have to be adequate to encourage deep respiration and prevent spinting. Monitor closely.  HFrEF/CAD: Pt's last Echocardiogram was in 05/2018 demonstrated EF of 25-30% with severe hypokinesis and Grade 1 diastolic dysfunction. Will repeat echocardiogram to update evaluation. The patient will be monitored on telemetry. Will also check and EKG. Continue low dose ASA, lipitor, lasix, entresto, and carvedilol.   DM II: The patient's glucoses are  managed at home with metformin, synjardy, lantus 12 units daily, and novolog ssi with meals. Will check a hemoglobin A1c. He will be continued  on lantus with FSBS and SSI. The patient will be placed on a mocdified carbohydrate diet and a hypoglycemia protocol.  Dysequilibrium: Chronic. Consult PT/OT when appropriate. Pt uses a walker at home.  GERD with esophagitis: Continue protonix as at home.  Glaucoma/Pseudophadia: Continue eye drops as at home.  Chronic cough: Noted. Monitor.  BPH: Continue Flomax and Proscar as at home.  Iron deficiency anemia: Continue oral supplementation of iron. Monitor hemoglobin.  I have seen and examined this patient myself. I have spent 78 minutes in his evaluation and admission.  DVT Prophylaxis: SCD's CODE STATUS: Full Code Family Communication: None available Disposition: The patient is from home. Anticipate discharge to: TBD   Farooq Petrovich 01/11/2020, 6:06 PM

## 2020-01-11 NOTE — ED Notes (Signed)
Report given to [facility]. 

## 2020-01-11 NOTE — ED Notes (Addendum)
[facility] called for transport to [facility] 

## 2020-01-12 ENCOUNTER — Inpatient Hospital Stay (HOSPITAL_COMMUNITY): Payer: Medicare PPO

## 2020-01-12 DIAGNOSIS — Z794 Long term (current) use of insulin: Secondary | ICD-10-CM | POA: Diagnosis not present

## 2020-01-12 DIAGNOSIS — S2241XD Multiple fractures of ribs, right side, subsequent encounter for fracture with routine healing: Secondary | ICD-10-CM | POA: Diagnosis not present

## 2020-01-12 DIAGNOSIS — Y92009 Unspecified place in unspecified non-institutional (private) residence as the place of occurrence of the external cause: Secondary | ICD-10-CM | POA: Diagnosis not present

## 2020-01-12 DIAGNOSIS — S2231XA Fracture of one rib, right side, initial encounter for closed fracture: Secondary | ICD-10-CM | POA: Diagnosis not present

## 2020-01-12 DIAGNOSIS — I5021 Acute systolic (congestive) heart failure: Secondary | ICD-10-CM

## 2020-01-12 DIAGNOSIS — I5032 Chronic diastolic (congestive) heart failure: Secondary | ICD-10-CM | POA: Diagnosis not present

## 2020-01-12 DIAGNOSIS — S2241XA Multiple fractures of ribs, right side, initial encounter for closed fracture: Secondary | ICD-10-CM | POA: Diagnosis not present

## 2020-01-12 DIAGNOSIS — S52501A Unspecified fracture of the lower end of right radius, initial encounter for closed fracture: Secondary | ICD-10-CM | POA: Diagnosis not present

## 2020-01-12 DIAGNOSIS — W19XXXA Unspecified fall, initial encounter: Secondary | ICD-10-CM | POA: Diagnosis not present

## 2020-01-12 DIAGNOSIS — E119 Type 2 diabetes mellitus without complications: Secondary | ICD-10-CM | POA: Diagnosis not present

## 2020-01-12 LAB — CBC
HCT: 41.3 % (ref 39.0–52.0)
Hemoglobin: 13.4 g/dL (ref 13.0–17.0)
MCH: 28.5 pg (ref 26.0–34.0)
MCHC: 32.4 g/dL (ref 30.0–36.0)
MCV: 87.7 fL (ref 80.0–100.0)
Platelets: 173 10*3/uL (ref 150–400)
RBC: 4.71 MIL/uL (ref 4.22–5.81)
RDW: 14.4 % (ref 11.5–15.5)
WBC: 10.7 10*3/uL — ABNORMAL HIGH (ref 4.0–10.5)
nRBC: 0 % (ref 0.0–0.2)

## 2020-01-12 LAB — BASIC METABOLIC PANEL
Anion gap: 17 — ABNORMAL HIGH (ref 5–15)
BUN: 18 mg/dL (ref 8–23)
CO2: 17 mmol/L — ABNORMAL LOW (ref 22–32)
Calcium: 8.7 mg/dL — ABNORMAL LOW (ref 8.9–10.3)
Chloride: 107 mmol/L (ref 98–111)
Creatinine, Ser: 1.05 mg/dL (ref 0.61–1.24)
GFR calc Af Amer: >60 mL/min (ref >60)
GFR calc non Af Amer: >60 mL/min (ref >60)
Glucose, Bld: 174 mg/dL — ABNORMAL HIGH (ref 70–99)
Potassium: 3.7 mmol/L (ref 3.5–5.1)
Sodium: 141 mmol/L (ref 135–145)

## 2020-01-12 LAB — ECHOCARDIOGRAM COMPLETE
Area-P 1/2: 2.91 cm2
P 1/2 time: 434 msec
S' Lateral: 3 cm

## 2020-01-12 LAB — GLUCOSE, CAPILLARY
Glucose-Capillary: 129 mg/dL — ABNORMAL HIGH (ref 70–99)
Glucose-Capillary: 165 mg/dL — ABNORMAL HIGH (ref 70–99)
Glucose-Capillary: 177 mg/dL — ABNORMAL HIGH (ref 70–99)
Glucose-Capillary: 268 mg/dL — ABNORMAL HIGH (ref 70–99)
Glucose-Capillary: 328 mg/dL — ABNORMAL HIGH (ref 70–99)
Glucose-Capillary: 347 mg/dL — ABNORMAL HIGH (ref 70–99)
Glucose-Capillary: 375 mg/dL — ABNORMAL HIGH (ref 70–99)

## 2020-01-12 LAB — HEMOGLOBIN A1C
Hgb A1c MFr Bld: 6.8 % — ABNORMAL HIGH (ref 4.8–5.6)
Mean Plasma Glucose: 148.46 mg/dL

## 2020-01-12 MED ORDER — TRAMADOL HCL 50 MG PO TABS
50.0000 mg | ORAL_TABLET | Freq: Four times a day (QID) | ORAL | Status: DC | PRN
  Administered 2020-01-12 – 2020-01-14 (×3): 50 mg via ORAL
  Filled 2020-01-12 (×3): qty 1

## 2020-01-12 MED ORDER — ADULT MULTIVITAMIN W/MINERALS CH
1.0000 | ORAL_TABLET | Freq: Every day | ORAL | Status: DC
  Administered 2020-01-12 – 2020-01-14 (×3): 1 via ORAL
  Filled 2020-01-12 (×3): qty 1

## 2020-01-12 MED ORDER — FERROUS SULFATE 325 (65 FE) MG PO TABS
325.0000 mg | ORAL_TABLET | Freq: Every day | ORAL | Status: DC
  Administered 2020-01-12 – 2020-01-14 (×3): 325 mg via ORAL
  Filled 2020-01-12 (×3): qty 1

## 2020-01-12 MED ORDER — GUAIFENESIN ER 600 MG PO TB12
600.0000 mg | ORAL_TABLET | Freq: Two times a day (BID) | ORAL | Status: DC
  Administered 2020-01-12 – 2020-01-14 (×5): 600 mg via ORAL
  Filled 2020-01-12 (×5): qty 1

## 2020-01-12 MED ORDER — MORPHINE SULFATE (PF) 2 MG/ML IV SOLN
1.0000 mg | INTRAVENOUS | Status: DC | PRN
  Administered 2020-01-14: 2 mg via INTRAVENOUS
  Filled 2020-01-12: qty 1

## 2020-01-12 MED ORDER — ACETAMINOPHEN 325 MG PO TABS
650.0000 mg | ORAL_TABLET | Freq: Four times a day (QID) | ORAL | Status: DC
  Administered 2020-01-12 – 2020-01-14 (×9): 650 mg via ORAL
  Filled 2020-01-12 (×9): qty 2

## 2020-01-12 MED ORDER — ENSURE ENLIVE PO LIQD
237.0000 mL | Freq: Two times a day (BID) | ORAL | Status: DC
  Administered 2020-01-12 – 2020-01-14 (×5): 237 mL via ORAL

## 2020-01-12 MED ORDER — INSULIN ASPART 100 UNIT/ML ~~LOC~~ SOLN
0.0000 [IU] | Freq: Every day | SUBCUTANEOUS | Status: DC
  Administered 2020-01-12: 4 [IU] via SUBCUTANEOUS

## 2020-01-12 MED ORDER — INSULIN ASPART 100 UNIT/ML ~~LOC~~ SOLN
0.0000 [IU] | Freq: Three times a day (TID) | SUBCUTANEOUS | Status: DC
  Administered 2020-01-12: 7 [IU] via SUBCUTANEOUS
  Administered 2020-01-12: 5 [IU] via SUBCUTANEOUS
  Administered 2020-01-13: 1 [IU] via SUBCUTANEOUS
  Administered 2020-01-13 (×2): 3 [IU] via SUBCUTANEOUS
  Administered 2020-01-14: 5 [IU] via SUBCUTANEOUS
  Administered 2020-01-14: 2 [IU] via SUBCUTANEOUS

## 2020-01-12 MED ORDER — METHOCARBAMOL 500 MG PO TABS
500.0000 mg | ORAL_TABLET | Freq: Three times a day (TID) | ORAL | Status: DC
  Administered 2020-01-12 – 2020-01-14 (×7): 500 mg via ORAL
  Filled 2020-01-12 (×7): qty 1

## 2020-01-12 MED ORDER — SACUBITRIL-VALSARTAN 24-26 MG PO TABS
1.0000 | ORAL_TABLET | Freq: Two times a day (BID) | ORAL | Status: DC
  Administered 2020-01-12 – 2020-01-14 (×5): 1 via ORAL
  Filled 2020-01-12 (×6): qty 1

## 2020-01-12 NOTE — TOC Initial Note (Signed)
Transition of Care [facility]) - Initial/Assessment Note    Patient Details  Name: Matthew Calderon MRN: [id number] Date of Birth: 1932/10/12  Transition of Care [facility]) CM/SW Contact:    Doy Hutching, LCSW Phone Number: 01/12/2020, 11:48 AM  Clinical Narrative:                 CSW spoke with pt at bedside. Introduced self, role, reason for visit. Confirmed pt from [facility]. He is aware that there is a bed ready for him at the health care center and is amenable to going when stable for d/c. He gives me permission to call Kennon Rounds his daughter or Tawanna Cooler his son in Social worker.   CSW called Kennon Rounds at [phone]. Introduced self, role, reason for visit. Pt daughter has also spoken with Tresa Endo in admissions at [facility] (SNF) at [facility]. She is aware I will start insurance prior authorization. She confirms pt has been vaccinated. If d/c tomorrow will not need new COVID if stays through Friday will need new COVID swab. Received Kedar's number [phone]) and added it to facesheet for pt.   Provided pt daughter with desk phone and room phone numbers via text at her request. Faxed in prior authorization to [place], [place] #[phone].  Expected Discharge Plan: Skilled Nursing Facility Barriers to Discharge: Continued Medical Work up, English as a second language teacher   Patient Goals and CMS Choice Patient states their goals for this hospitalization and ongoing recovery are:: get support while he heals CMS Medicare.gov Compare Post Acute Care list provided to:: Patient (pt is resident at [facility]) Choice offered to / list presented to : Patient  Expected Discharge Plan and Services Expected Discharge Plan: Skilled Nursing Facility In-house Referral: Clinical Social Work Discharge Planning Services: CM Consult Post Acute Care Choice: Skilled Nursing Facility, Durable Medical Equipment Living arrangements for the past 2 months: Marketing executive, Apartment  Prior Living  Arrangements/Services Living arrangements for the past 2 months: Marketing executive, Apartment Lives with:: Self Patient language and need for interpreter reviewed:: Yes (no needs) Do you feel safe going back to the place where you live?: Yes      Need for Family Participation in Patient Care: Yes (Comment) (assistance w/ daily cares) Care giver support system in place?: Yes (comment) Proofreader resident; family is supportive) Current home services: DME Criminal Activity/Legal Involvement Pertinent to Current Situation/Hospitalization: No - Comment as needed  Activities of Daily Living Home Assistive Devices/Equipment: Environmental consultant (specify type) (med alert necklace) ADL Screening (condition at time of admission) Patient's cognitive ability adequate to safely complete daily activities?: Yes Is the patient deaf or have difficulty hearing?: Yes Does the patient have difficulty seeing, even when wearing glasses/contacts?: Yes (age related macular degeneration, glaucoma) Does the patient have difficulty concentrating, remembering, or making decisions?: Yes (short term memory loss) Patient able to express need for assistance with ADLs?: Yes Does the patient have difficulty dressing or bathing?: No Independently performs ADLs?: No Communication: Independent Dressing (OT): Independent Grooming: Independent Feeding: Independent Bathing: Independent Toileting: Needs assistance Is this a change from baseline?: Pre-admission baseline In/Out Bed: Needs assistance Is this a change from baseline?: Pre-admission baseline Walks in Home: Needs assistance Is this a change from baseline?: Pre-admission baseline Does the patient have difficulty walking or climbing stairs?: Yes Weakness of Legs: Both Weakness of Arms/Hands: Both  Permission Sought/Granted Permission sought to share information with : Family Supports Permission granted to share information with : Yes, Verbal Permission Granted  Share  Information with NAME: Kennon Rounds  Manson Passey  Permission granted to share info w AGENCY: [employer]  Permission granted to share info w Relationship: daughter  Permission granted to share info w Contact Information: [phone]  Emotional Assessment Appearance:: Appears stated age Attitude/Demeanor/Rapport: Engaged, Gracious Affect (typically observed): Accepting, Adaptable, Appropriate, Pleasant Orientation: : Oriented to Self, Oriented to Place, Oriented to Situation Alcohol / Substance Use: Not Applicable Psych Involvement: No (comment)  Admission diagnosis:  Gait instability [R26.81] Multiple rib fractures [S22.49XA] Laceration of forehead, initial encounter [S01.81XA] Right wrist fracture, closed, initial encounter [S62.101A] Periorbital hematoma of right eye [H05.231] Closed fracture of multiple ribs of right side, initial encounter [S22.41XA] Patient Active Problem List   Diagnosis Date Noted  . Multiple rib fractures 01/11/2020  . Dysequilibrium 01/11/2020  . Fall at home, initial encounter 01/11/2020  . Head trauma, subsequent encounter 01/11/2020  . Arthritis 05/14/2019  . Enlarged prostate 05/14/2019  . History of tobacco abuse 05/14/2019  . Interstitial cystitis 05/14/2019  . Obesity 05/14/2019  . OSA (obstructive sleep apnea) 05/14/2019  . PONV (postoperative nausea and vomiting) 05/14/2019  . Post-nasal drip 05/14/2019  . Seasonal allergies 05/14/2019  . Chronic cough 07/21/2018  . GERD (gastroesophageal reflux disease) 07/21/2018  . NSTEMI (non-ST elevated myocardial infarction) (HCC) 06/20/2018  . Prostate hyperplasia, benign localized, without urinary obstruction   . Hyperglycemia   . Syncope, cardiogenic   . Degenerative joint disease (DJD) of hip 01/13/2014  . Diabetes (HCC) 01/13/2014  . Pseudophakia, both eyes 12/01/2013  . Nonexudative age-related macular degeneration 01/16/2013  . Primary open angle glaucoma of right eye, severe stage 01/13/2013  . Allergic  rhinitis 09/17/2010  . Anemia, unspecified 09/17/2010  . Gastro-esophageal reflux disease with esophagitis 12/27/2009  . Other and unspecified hyperlipidemia 12/27/2009  . Hypertrophy of prostate without urinary obstruction and other lower urinary tract symptoms (LUTS) 07/11/2009  . Insomnia 07/11/2009  . Osteoporosis 07/11/2009  . Other malaise and fatigue 06/15/2008  . Depressive disorder, not elsewhere classified 05/04/2008  . Unspecified glaucoma 05/04/2008   PCP:  Merri Brunette, MD Pharmacy:   [facility] - [place], [place] - [address] [place] [address] Phone: [phone] Fax: [phone]   Readmission Risk Interventions Readmission Risk Prevention Plan 01/12/2020  Post Dischage Appt Not Complete  Appt Comments plan for SNF  Medication Screening Complete  Transportation Screening Complete  Some recent data might be hidden

## 2020-01-12 NOTE — Social Work (Addendum)
CSW has reached out to [facility] liaison regarding pt admission. Pt from [facility] a bed is available at their SNF when pt ready, pt is managed by [facility] Medicare and will need prior authorization.   Octavio Graves, MSW, LCSW [facility] Clinical Social Work

## 2020-01-12 NOTE — Progress Notes (Signed)
Initial Nutrition Assessment  DOCUMENTATION CODES:   Not applicable  INTERVENTION:   -Ensure Enlive po BID, each supplement provides 350 kcal and 20 grams of protein -MVI with minerals daily  NUTRITION DIAGNOSIS:   Inadequate oral intake related to decreased appetite as evidenced by meal completion < 50%, per patient/family report.  GOAL:   Patient will meet greater than or equal to 90% of their needs  MONITOR:   PO intake, Supplement acceptance, Diet advancement, Labs, Weight trends, Skin, I & O's  REASON FOR ASSESSMENT:   Malnutrition Screening Tool    ASSESSMENT:   86 year old gentleman who was brought in by EMS after falling at home.  Pt admitted s/p fall with multiple trauma injuries (multiple rt rib fractures, rt wrist fracture, and facial laceration).   Reviewed I/O's: -200 ml x 24 hours  UOP: 200 ml x 24 hours  Per orthopedics noes, no plan for surgical interventions at this time.   Spoke with pt, who was sitting in recliner chair at time of visit. He reports a decreased appetite from approximately one year, after experiencing a "mini heart attack" ("people always say you lose your appetite after that, and I never believed them, until it happened to me"). Observed pt breakfast tray- pt consumed only juice off the meal tray, however, consumed a pack of graham crackers during RD visit without difficulty.   PTA, pt was consuming 2 meals and 1 snack per day (Breakfast: cereal; Dinner: soup and sandwich; HS snack).   Pt reports his UBW is around 165#. He estimates he has lost about 25# over the past year. Unable to obtain current wt at this time.   Discussed with pt importance of good meal and supplement intake to promote healing. Pt is amenable to Ensure supplements- he has used them in the past and has considered starting to use them again after he was told about low protein levels at his PCP appointment.  Medications reviewed and include colace, ferrous sulfate,  and melatonin.   Lab Results  Component Value Date   HGBA1C 6.8 (H) 01/11/2020   PTA DM medications are .none   Labs reviewed: CBGS: 129-240 (inpatient orders for glycemic control are 0-15 units insulin aspart TID with meals, 0-6 units inuslin aspart every 4 hours, and 12 units inuslin glargine daily).   NUTRITION - FOCUSED PHYSICAL EXAM:    Most Recent Value  Orbital Region No depletion  Upper Arm Region No depletion  Thoracic and Lumbar Region No depletion  Buccal Region No depletion  Temple Region No depletion  Clavicle Bone Region No depletion  Clavicle and Acromion Bone Region No depletion  Scapular Bone Region No depletion  Dorsal Hand No depletion  Patellar Region No depletion  Anterior Thigh Region No depletion  Posterior Calf Region No depletion  Edema (RD Assessment) Mild  Hair Reviewed  Eyes Reviewed  Mouth Reviewed  Skin Reviewed  Nails Reviewed       Diet Order:   Diet Order            Diet heart healthy/carb modified Room service appropriate? Yes; Fluid consistency: Thin  Diet effective now                 EDUCATION NEEDS:   Education needs have been addressed  Skin:  Skin Assessment: Skin Integrity Issues: Skin Integrity Issues:: Incisions Incisions: closed rt head  Last BM:  Unknown  Height:   Ht Readings from Last 1 Encounters:  07/21/18 5' (1.524 m)  Weight:   Wt Readings from Last 1 Encounters:  07/21/18 71.9 kg    Ideal Body Weight:  48.2 kg  BMI:  There is no height or weight on file to calculate BMI.  Estimated Nutritional Needs:   Kcal:  1450-1650  Protein:  65-80 grams  Fluid:  > 1.4 L    Levada Schilling, RD, LDN, CDCES Registered Dietitian II Certified Diabetes Care and Education Specialist Please refer to [facility] for RD and/or RD on-call/weekend/after hours pager

## 2020-01-12 NOTE — Discharge Instructions (Signed)
Rib Fracture  A rib fracture is a break or crack in one of the bones of the ribs. The ribs are like a cage that goes around your upper chest. A broken or cracked rib is often painful, but most do not cause other problems. Most rib fractures usually heal on their own in 1-3 months. Follow these instructions at home: Managing pain, stiffness, and swelling  If directed, apply ice to the injured area. ? Put ice in a plastic bag. ? Place a towel between your skin and the bag. ? Leave the ice on for 20 minutes, 2-3 times a day.  Take over-the-counter and prescription medicines only as told by your doctor. Activity  Avoid activities that cause pain to the injured area. Protect your injured area.  Slowly increase activity as told by your doctor. General instructions  Do deep breathing as told by your doctor. You may be told to: ? Take deep breaths many times a day. ? Cough many times a day while hugging a pillow. ? Use a device (incentive spirometer) to do deep breathing many times a day.  Drink enough fluid to keep your pee (urine) clear or pale yellow.  Do not wear a rib belt or binder. These do not allow you to breathe deeply.  Keep all follow-up visits as told by your doctor. This is important. Contact a doctor if:  You have a fever. Get help right away if:  You have trouble breathing.  You are short of breath.  You cannot stop coughing.  You cough up thick or bloody spit (sputum).  You feel sick to your stomach (nauseous), throw up (vomit), or have belly (abdominal) pain.  Your pain gets worse and medicine does not help. Summary  A rib fracture is a break or crack in one of the bones of the ribs.  Apply ice to the injured area and take medicines for pain as told by your doctor.  Take deep breaths and cough many times a day. Hug a pillow every time you cough. This information is not intended to replace advice given to you by your health care provider. Make sure you  discuss any questions you have with your health care provider. Document Revised: 05/23/2017 Document Reviewed: 09/10/2016 Elsevier Patient Education  2020 Elsevier Inc.  

## 2020-01-12 NOTE — Progress Notes (Signed)
PROGRESS NOTE  Matthew Calderon EGB:[id number] DOB: 11/04/1932 DOA: 01/11/2020 PCP: Merri Brunette, MD  HPI/Recap of past 73 hours: 86 year old male from assisted living normally ambulates with a walker sustained what he reports is a mechanical fall in the bathroom on 7/20.  Patient himself denies passing out.  In emergency room, he was noted to have a forehead laceration, bruising about his head, a right wrist fracture and a CT of his chest noted right sided lateral rib fractures.  Fortunately, there were no other fractures.  He was brought in for further pain control and evaluation.  Patient was seen by hand surgery who recommended a splint, but no surgical intervention.  Trauma surgery recommended pain management and PT evaluation.  No events overnight.  Echocardiogram done today noted chronic diastolic dysfunction, significant improvement from her previous echo which noted impaired ejection fraction.  Patient seen by PT and OT recommended skilled nursing.  Assessment/Plan: Principal Problem:   Fall at home, initial encounter with multiple rib fractures, forehead laceration and right distal radius fracture.  Pain a little bit better controlled today.  Seen by PT and OT who are recommending skilled nursing.  No evidence of syncope.  Echo is stable and actually shows improvement. Active Problems:   Diabetes (HCC): CBGs starting to trend upward.  A1c notes good control.  Started Lantus back this morning.   Prostate hyperplasia, benign localized, without urinary obstruction: Stable.    Anemia, unspecified: Stable at this time.     Gastro-esophageal reflux disease with esophagitis: Continue PPI.   OSA (obstructive sleep apnea): Stable.    Primary open angle glaucoma of right eye, severe stage: Continue drops.    Chronic diastolic CHF (congestive heart failure) (HCC): No evidence of volume overload at this time.  Ejection fraction is actually preserved which is an improvement from  previous.   Code Status: Full code  Family Communication: Left message for family  Disposition Plan: Discharge to skilled nursing once bed approved   Consultants:  Trauma surgery  Hand surgery  Procedures:  Echocardiogram done 7/21 notes chronic diastolic dysfunction  Antimicrobials:  None  DVT prophylaxis: SCDs   Objective: Vitals:   01/12/20 1335 01/12/20 1851  BP: 100/67 (!) 104/57  Pulse: 83 83  Resp: 16 14  Temp: 98.6 F (37 C) 98.2 F (36.8 C)  SpO2: 97% 95%    Intake/Output Summary (Last 24 hours) at 01/12/2020 1928 Last data filed at 01/12/2020 1700 Gross per 24 hour  Intake 1240 ml  Output 1400 ml  Net -160 ml   There were no vitals filed for this visit. There is no height or weight on file to calculate BMI.  Exam:   General: Alert and oriented x2, no acute distress  HEENT: Normocephalic, multiple ecchymoses around face, mucous membranes are slightly dry  Cardiovascular: Regular rate and rhythm, S1-S2  Respiratory: Clear to auscultation bilaterally  Abdomen: Soft, nontender, nondistended with positive bowel sounds  Musculoskeletal: No clubbing or cyanosis, trace pitting edema.  Right wrist in sling  Skin: Bruising as described above  Psychiatry: Appropriate, no evidence of psychoses   Data Reviewed: CBC: Recent Labs  Lab 01/11/20 1320 01/12/20 0843  WBC 8.9 10.7*  NEUTROABS 6.7  --   HGB 13.9 13.4  HCT 42.6 41.3  MCV 90.1 87.7  PLT 168 173   Basic Metabolic Panel: Recent Labs  Lab 01/11/20 1320 01/12/20 0843  NA 142 141  K 4.2 3.7  CL 108 107  CO2 21* 17*  GLUCOSE  137* 174*  BUN 23 18  CREATININE 0.91 1.05  CALCIUM 8.4* 8.7*   GFR: CrCl cannot be calculated (Unknown ideal weight.). Liver Function Tests: No results for input(s): AST, ALT, ALKPHOS, BILITOT, PROT, ALBUMIN in the last 168 hours. No results for input(s): LIPASE, AMYLASE in the last 168 hours. No results for input(s): AMMONIA in the last 168  hours. Coagulation Profile: Recent Labs  Lab 01/11/20 1320  INR 1.1   Cardiac Enzymes: No results for input(s): CKTOTAL, CKMB, CKMBINDEX, TROPONINI in the last 168 hours. BNP (last 3 results) No results for input(s): PROBNP in the last 8760 hours. HbA1C: Recent Labs    01/11/20 1748  HGBA1C 6.8*   CBG: Recent Labs  Lab 01/12/20 0050 01/12/20 0337 01/12/20 0754 01/12/20 1142 01/12/20 1708  GLUCAP 129* 177* 165* 268* 347*   Lipid Profile: No results for input(s): CHOL, HDL, LDLCALC, TRIG, CHOLHDL, LDLDIRECT in the last 72 hours. Thyroid Function Tests: No results for input(s): TSH, T4TOTAL, FREET4, T3FREE, THYROIDAB in the last 72 hours. Anemia Panel: No results for input(s): VITAMINB12, FOLATE, FERRITIN, TIBC, IRON, RETICCTPCT in the last 72 hours. Urine analysis:    Component Value Date/Time   COLORURINE YELLOW 06/20/2018 0724   APPEARANCEUR CLEAR 06/20/2018 0724   LABSPEC 1.026 06/20/2018 0724   PHURINE 5.0 06/20/2018 0724   GLUCOSEU >=500 (A) 06/20/2018 0724   HGBUR MODERATE (A) 06/20/2018 0724   BILIRUBINUR NEGATIVE 06/20/2018 0724   KETONESUR 20 (A) 06/20/2018 0724   PROTEINUR 30 (A) 06/20/2018 0724   UROBILINOGEN 0.2 01/16/2014 1252   NITRITE NEGATIVE 06/20/2018 0724   LEUKOCYTESUR NEGATIVE 06/20/2018 0724   Sepsis Labs: (procalcitonin:4,lacticidven:4)  ) Recent Results (from the past 240 hour(s))  SARS Coronavirus 2 by RT PCR (hospital order, performed in [facility] hospital lab) Nasopharyngeal Nasopharyngeal Swab     Status: None   Collection Time: 01/11/20  6:26 PM   Specimen: Nasopharyngeal Swab  Result Value Ref Range Status   SARS Coronavirus 2 NEGATIVE NEGATIVE Final    Comment: (NOTE) SARS-CoV-2 target nucleic acids are NOT DETECTED.  The SARS-CoV-2 RNA is generally detectable in upper and lower respiratory specimens during the acute phase of infection. The lowest concentration of SARS-CoV-2 viral copies this assay can detect is  250 copies / mL. A negative result does not preclude SARS-CoV-2 infection and should not be used as the sole basis for treatment or other patient management decisions.  A negative result may occur with improper specimen collection / handling, submission of specimen other than nasopharyngeal swab, presence of viral mutation(s) within the areas targeted by this assay, and inadequate number of viral copies (<250 copies / mL). A negative result must be combined with clinical observations, patient history, and epidemiological information.  Fact Sheet for Patients:   BoilerBrush.com.cy  Fact Sheet for Healthcare Providers: https://pope.com/  This test is not yet approved or  cleared by the [place] FDA and has been authorized for detection and/or diagnosis of SARS-CoV-2 by FDA under an Emergency Use Authorization (EUA).  This EUA will remain in effect (meaning this test can be used) for the duration of the COVID-19 declaration under Section 564(b)(1) of the Act, 21 U.S.C. section 360bbb-3(b)(1), unless the authorization is terminated or revoked sooner.  Performed at [facility], [address], [place], [place] [address]       Studies: DG CHEST PORT 1 VIEW  Result Date: 01/12/2020 CLINICAL DATA:  Multiple right rib fractures. EXAM: PORTABLE CHEST 1 VIEW COMPARISON:  January 11, 2020. FINDINGS: The heart size and mediastinal contours are within normal limits. Both lungs are clear. No pneumothorax or pleural effusion is noted. Minimally displaced right fourth rib fracture is noted. IMPRESSION: Minimally displaced right fourth rib fracture. No acute cardiopulmonary abnormality seen. Electronically Signed   By: Lupita Raider M.D.   On: 01/12/2020 08:34   ECHOCARDIOGRAM COMPLETE  Result Date: 01/12/2020    ECHOCARDIOGRAM REPORT   Patient Name:   Matthew Calderon Date of Exam: 01/12/2020 Medical Rec #:  [id number]       Height:       60.0 in Accession #:    [id number]     Weight:       158.6 lb Date of Birth:  1932-09-09      BSA:          1.691 m Patient Age:    87 years       BP:           123/67 mmHg Patient Gender: M              HR:           87 bpm. Exam Location:  Inpatient Procedure: 2D Echo Indications:    CHF-Acute Systolic I50.21  History:        Patient has prior history of Echocardiogram examinations, most                 recent 06/21/2018. Previous Myocardial Infarction; Risk                 Factors:Diabetes.  Sonographer:    Thurman Coyer RDCS (AE) Referring Phys: [id number] AVA SWAYZE IMPRESSIONS  1. Left ventricular ejection fraction, by estimation, is 55 to 60%. The left ventricle has normal function. Left ventricular endocardial border not optimally defined to evaluate regional wall motion. Left ventricular diastolic parameters are consistent with Grade I diastolic dysfunction (impaired relaxation).  2. Right ventricular systolic function is normal. The right ventricular size is normal. Tricuspid regurgitation signal is inadequate for assessing PA pressure.  3. The mitral valve is normal in structure. No evidence of mitral valve regurgitation. No evidence of mitral stenosis.  4. The aortic valve is tricuspid. Aortic valve regurgitation is trivial. Mild to moderate aortic valve sclerosis/calcification is present, without any evidence of aortic stenosis.  5. Aortic dilatation noted. There is mild dilatation of the aortic root measuring 37 mm.  6. The inferior vena cava is normal in size with greater than 50% respiratory variability, suggesting right atrial pressure of 3 mmHg.  7. Technically difficult study with poor acoustic windows. Overall LV EF is normal but difficult to comment on motion of individual wall segments. FINDINGS  Left Ventricle: Left ventricular ejection fraction, by estimation, is 55 to 60%. The left ventricle has normal function. Left ventricular endocardial border not optimally defined to  evaluate regional wall motion. The left ventricular internal cavity size was normal in size. There is no left ventricular hypertrophy. Left ventricular diastolic parameters are consistent with Grade I diastolic dysfunction (impaired relaxation). Right Ventricle: The right ventricular size is normal. No increase in right ventricular wall thickness. Right ventricular systolic function is normal. Tricuspid regurgitation signal is inadequate for assessing PA pressure. Left Atrium: Left atrial size was normal in size. Right Atrium: Right atrial size was normal in size. Pericardium: There is no evidence of pericardial effusion. Mitral Valve: The mitral valve is normal in structure. There is mild calcification of the mitral valve leaflet(s). Mild mitral annular  calcification. No evidence of mitral valve regurgitation. No evidence of mitral valve stenosis. Tricuspid Valve: The tricuspid valve is normal in structure. Tricuspid valve regurgitation is not demonstrated. Aortic Valve: The aortic valve is tricuspid. Aortic valve regurgitation is trivial. Aortic regurgitation PHT measures 434 msec. Mild to moderate aortic valve sclerosis/calcification is present, without any evidence of aortic stenosis. Pulmonic Valve: The pulmonic valve was normal in structure. Pulmonic valve regurgitation is not visualized. Aorta: Aortic dilatation noted. There is mild dilatation of the aortic root measuring 37 mm. Venous: The inferior vena cava is normal in size with greater than 50% respiratory variability, suggesting right atrial pressure of 3 mmHg. IAS/Shunts: No atrial level shunt detected by color flow Doppler.  LEFT VENTRICLE PLAX 2D LVIDd:         4.40 cm  Diastology LVIDs:         3.00 cm  LV e' lateral:   7.72 cm/s LV PW:         0.90 cm  LV E/e' lateral: 9.8 LV IVS:        1.00 cm  LV e' medial:    3.59 cm/s LVOT diam:     2.40 cm  LV E/e' medial:  21.0 LV SV:         79 LV SV Index:   47 LVOT Area:     4.52 cm  RIGHT VENTRICLE RV S  prime:     17.20 cm/s TAPSE (M-mode): 1.8 cm LEFT ATRIUM             Index       RIGHT ATRIUM          Index LA diam:        3.30 cm 1.95 cm/m  RA Area:     8.42 cm LA Vol (A2C):   21.4 ml 12.65 ml/m RA Volume:   14.60 ml 8.63 ml/m LA Vol (A4C):   22.9 ml 13.54 ml/m LA Biplane Vol: 24.3 ml 14.37 ml/m  AORTIC VALVE LVOT Vmax:   82.50 cm/s LVOT Vmean:  55.500 cm/s LVOT VTI:    0.175 m AI PHT:      434 msec  AORTA Ao Root diam: 3.70 cm Ao Asc diam:  3.30 cm MITRAL VALVE MV Area (PHT): 2.91 cm     SHUNTS MV Decel Time: 261 msec     Systemic VTI:  0.18 m MV E velocity: 75.50 cm/s   Systemic Diam: 2.40 cm MV A velocity: 101.00 cm/s MV E/A ratio:  0.75 Marca Anconaalton Mclean MD Electronically signed by Marca Anconaalton Mclean MD Signature Date/Time: 01/12/2020/4:00:07 PM    Final     Scheduled Meds:  acetaminophen  650 mg Oral Q6H   aspirin EC  81 mg Oral Daily   atorvastatin  40 mg Oral q1800   brimonidine  1 drop Both Eyes BID   carvedilol  12.5 mg Oral BID WC   cholecalciferol  2,000 Units Oral Daily   docusate sodium  100 mg Oral BID   feeding supplement (ENSURE ENLIVE)  237 mL Oral BID BM   ferrous sulfate  325 mg Oral Q breakfast   finasteride  5 mg Oral Daily   furosemide  40 mg Oral Daily   guaiFENesin  600 mg Oral BID   insulin aspart  0-5 Units Subcutaneous QHS   insulin aspart  0-9 Units Subcutaneous TID WC   insulin glargine  12 Units Subcutaneous Daily   melatonin  10 mg Oral QHS   methocarbamol  500 mg  Oral TID   mirabegron ER  50 mg Oral Daily   multivitamin with minerals  1 tablet Oral Daily   pantoprazole  40 mg Oral Daily   potassium chloride  10 mEq Oral Daily   sacubitril-valsartan  1 tablet Oral BID   tamsulosin  0.4 mg Oral Daily   timolol  1 drop Both Eyes BID   traZODone  50 mg Oral QHS    Continuous Infusions:   LOS: 1 day     Hollice Espy, MD [facility]   01/12/2020, 7:28 PM

## 2020-01-12 NOTE — Progress Notes (Signed)
  Echocardiogram 2D Echocardiogram has been performed.  Tye Savoy 01/12/2020, 10:27 AM

## 2020-01-12 NOTE — NC FL2 (Signed)
[place] MEDICAID FL2 LEVEL OF CARE SCREENING TOOL     IDENTIFICATION  Patient Name: Matthew Calderon Birthdate: [birth year] Sex: male Admission Date (Current Location): 01/11/2020  [facility] and [place] Number:  Producer, television/film/video and Address:  [facility] [place], [address], [place], [place] [address]      Provider Number: [id number]  Attending Physician Name and Address:  Hollice Espy, MD  Relative Name and Phone Number:       Current Level of Care: Hospital Recommended Level of Care: Skilled Nursing Facility Prior Approval Number:    Date Approved/Denied:   PASRR Number: [id number]  Discharge Plan: SNF    Current Diagnoses: Patient Active Problem List   Diagnosis Date Noted   Multiple rib fractures 01/11/2020   Dysequilibrium 01/11/2020   Fall at home, initial encounter 01/11/2020   Head trauma, subsequent encounter 01/11/2020   Arthritis 05/14/2019   Enlarged prostate 05/14/2019   History of tobacco abuse 05/14/2019   Interstitial cystitis 05/14/2019   Obesity 05/14/2019   OSA (obstructive sleep apnea) 05/14/2019   PONV (postoperative nausea and vomiting) 05/14/2019   Post-nasal drip 05/14/2019   Seasonal allergies 05/14/2019   Chronic cough 07/21/2018   GERD (gastroesophageal reflux disease) 07/21/2018   NSTEMI (non-ST elevated myocardial infarction) (HCC) 06/20/2018   Prostate hyperplasia, benign localized, without urinary obstruction    Hyperglycemia    Syncope, cardiogenic    Degenerative joint disease (DJD) of hip 01/13/2014   Diabetes (HCC) 01/13/2014   Pseudophakia, both eyes 12/01/2013   Nonexudative age-related macular degeneration 01/16/2013   Primary open angle glaucoma of right eye, severe stage 01/13/2013   Allergic rhinitis 09/17/2010   Anemia, unspecified 09/17/2010   Gastro-esophageal reflux disease with esophagitis 12/27/2009   Other and unspecified hyperlipidemia 12/27/2009    Hypertrophy of prostate without urinary obstruction and other lower urinary tract symptoms (LUTS) 07/11/2009   Insomnia 07/11/2009   Osteoporosis 07/11/2009   Other malaise and fatigue 06/15/2008   Depressive disorder, not elsewhere classified 05/04/2008   Unspecified glaucoma 05/04/2008    Orientation RESPIRATION BLADDER Height & Weight     Self, Situation, Place  Normal External catheter, Incontinent Weight:   Height:     BEHAVIORAL SYMPTOMS/MOOD NEUROLOGICAL BOWEL NUTRITION STATUS      Continent Diet (see discharge summary)  AMBULATORY STATUS COMMUNICATION OF NEEDS Skin   Extensive Assist Verbally Surgical wounds, Skin abrasions, Other (Comment) (generalized ecchymosis; closed laceration on forehead; bruising.)                       Personal Care Assistance Level of Assistance  Bathing, Feeding, Dressing Bathing Assistance: Maximum assistance Feeding assistance: Limited assistance Dressing Assistance: Maximum assistance     Functional Limitations Info  Sight, Hearing, Speech Sight Info: Impaired Hearing Info: Impaired Speech Info: Adequate    SPECIAL CARE FACTORS FREQUENCY  PT (By licensed PT), OT (By licensed OT)     PT Frequency: 5x week OT Frequency: 5x week            Contractures Contractures Info: Not present    Additional Factors Info  Code Status, Allergies, Insulin Sliding Scale, Psychotropic Code Status Info: Full Code Allergies Info: Penicillins, Sulfa Antibiotics Psychotropic Info: traZODone (DESYREL) tablet 50 mg daily at bedtime Insulin Sliding Scale Info: insulin aspart (novoLOG) injection 0-15 Units 3x daily with meals; insulin aspart (novoLOG) injection 0-5 Units daily at bedtime; insulin aspart (novoLOG) injection 0-9 Units 3x daily with meals; insulin glargine (  LANTUS) injection 12 Units daily       Current Medications (01/12/2020):  This is the current hospital active medication list Current Facility-Administered Medications   Medication Dose Route Frequency Provider Last Rate Last Admin   acetaminophen (TYLENOL) tablet 650 mg  650 mg Oral Q6H Sampson, Self, PA-C   650 mg at 01/12/20 1010   aspirin EC tablet 81 mg  81 mg Oral Daily Swayze, Ava, DO   81 mg at 01/12/20 1011   atorvastatin (LIPITOR) tablet 40 mg  40 mg Oral q1800 Swayze, Ava, DO       brimonidine (ALPHAGAN) 0.2 % ophthalmic solution 1 drop  1 drop Both Eyes BID Swayze, Ava, DO   1 drop at 01/12/20 1011   carvedilol (COREG) tablet 12.5 mg  12.5 mg Oral BID WC Swayze, Ava, DO   12.5 mg at 01/12/20 1012   cholecalciferol (VITAMIN D3) tablet 2,000 Units  2,000 Units Oral Daily Swayze, Ava, DO   2,000 Units at 01/12/20 1011   docusate sodium (COLACE) capsule 100 mg  100 mg Oral BID Swayze, Ava, DO   100 mg at 01/12/20 1011   ferrous sulfate tablet 325 mg  325 mg Oral Q breakfast Swayze, Ava, DO   325 mg at 01/12/20 1013   finasteride (PROSCAR) tablet 5 mg  5 mg Oral Daily Swayze, Ava, DO   5 mg at 01/12/20 1011   furosemide (LASIX) tablet 40 mg  40 mg Oral Daily Swayze, Ava, DO   40 mg at 01/12/20 1011   guaiFENesin (MUCINEX) 12 hr tablet 600 mg  600 mg Oral BID Dominique, Calvey, PA-C   600 mg at 01/12/20 1011   insulin aspart (novoLOG) injection 0-15 Units  0-15 Units Subcutaneous TID WC Swayze, Ava, DO   2 Units at 01/12/20 1025   insulin aspart (novoLOG) injection 0-5 Units  0-5 Units Subcutaneous QHS Hollice Espy, MD       insulin aspart (novoLOG) injection 0-9 Units  0-9 Units Subcutaneous TID WC Hollice Espy, MD       insulin glargine (LANTUS) injection 12 Units  12 Units Subcutaneous Daily Swayze, Ava, DO   12 Units at 01/12/20 1011   melatonin tablet 10 mg  10 mg Oral QHS Swayze, Ava, DO       methocarbamol (ROBAXIN) tablet 500 mg  500 mg Oral TID Trixie Deis R, PA-C   500 mg at 01/12/20 1011   mirabegron ER (MYRBETRIQ) tablet 50 mg  50 mg Oral Daily Swayze, Ava, DO   50 mg at 01/12/20 1011   morphine 2 MG/ML  injection 1-2 mg  1-2 mg Intravenous Q3H PRN Juliet Rude, PA-C       multivitamin (PROSIGHT) tablet 1 tablet  1 tablet Oral BID Swayze, Ava, DO   1 tablet at 01/12/20 1011   ondansetron (ZOFRAN) tablet 4 mg  4 mg Oral Q6H PRN Swayze, Ava, DO       Or   ondansetron (ZOFRAN) injection 4 mg  4 mg Intravenous Q6H PRN Swayze, Ava, DO       pantoprazole (PROTONIX) EC tablet 40 mg  40 mg Oral Daily Swayze, Ava, DO   40 mg at 01/12/20 1011   potassium chloride (KLOR-CON) CR tablet 10 mEq  10 mEq Oral Daily Swayze, Ava, DO   10 mEq at 01/12/20 1011   sacubitril-valsartan (ENTRESTO) 24-26 mg per tablet  1 tablet Oral BID Swayze, Ava, DO   1 tablet at 01/12/20 1011  tamsulosin (FLOMAX) capsule 0.4 mg  0.4 mg Oral Daily Swayze, Ava, DO   0.4 mg at 01/12/20 1011   timolol (TIMOPTIC) 0.5 % ophthalmic solution 1 drop  1 drop Both Eyes BID Swayze, Ava, DO   1 drop at 01/12/20 1011   traMADol (ULTRAM) tablet 50 mg  50 mg Oral Q6H PRN Juliet Rude, PA-C       traZODone (DESYREL) tablet 50 mg  50 mg Oral QHS Swayze, Ava, DO   50 mg at 01/12/20 0159     Discharge Medications: Please see discharge summary for a list of discharge medications.  Relevant Imaging Results:  Relevant Lab Results:   Additional Information SSN: [id number] [place], [place]

## 2020-01-12 NOTE — Evaluation (Signed)
Physical Therapy Evaluation Patient Details Name: Matthew Calderon MRN: [id number] DOB: Jul 12, 1932 Today's Date: 01/12/2020   History of Present Illness  Pt  is 86yo male living at [facility], typically amb with RW but fell face first in bathroom when attempting to shave, + LOC, sustained R 5-9 rib fractures, R wrist fracture, lac to face PMH: anxiety, depression, DMII GERD, CHF, OSA, glaucoma, CAD, and anemia.  Clinical Impression  Pt admitted with above. Pt was indep with RW and with ADLs with exception of med management and meal prep PTA. Pt now with R UE NWB and unable to use RW. Will need w/c as primary mode of mobility and SNF level of care upon d/c until patient is able to use R UE. Pt tolerated mobility well with L HHA however remains at increased falls risk. Acute PT to cont to follow.     Follow Up Recommendations SNF;Supervision/Assistance - 24 hour    Equipment Recommendations  None recommended by PT    Recommendations for Other Services       Precautions / Restrictions Precautions Precautions: Fall Precaution Comments: R UE in sling Required Braces or Orthoses: Sling;Splint/Cast Splint/Cast: R splint with ace wrap and in sling Restrictions Weight Bearing Restrictions: Yes RUE Weight Bearing: Non weight bearing      Mobility  Bed Mobility Overal bed mobility: Needs Assistance Bed Mobility: Rolling;Sidelying to Sit Rolling: Mod assist Sidelying to sit: Mod assist       General bed mobility comments: rolled to the L, modA for trunk elevation and to scoot to EOB, increased pain  Transfers Overall transfer level: Needs assistance Equipment used: 1 person hand held assist (on L) Transfers: Sit to/from Stand Sit to Stand: Min assist         General transfer comment: posterior bias, wide base of support, slow and guarded  Ambulation/Gait Ambulation/Gait assistance: Mod assist;+2 safety/equipment (2nd person for chair follow) Gait Distance (Feet): 25 Feet  (x1, 75x1) Assistive device: 1 person hand held assist Gait Pattern/deviations: Step-through pattern;Decreased stride length;Wide base of support;Trunk flexed Gait velocity: slow Gait velocity interpretation: <1.8 ft/sec, indicate of risk for recurrent falls General Gait Details: pt with trunk flexion/anterior bias requiring max directional verbal cues to hold head up and not look down at feet. pt with episode of anterior LOB requiring modA to prevent fall, second bout pt able to maintain upright position and was much more steady but dependent on PTs HHA on L side, unable to use walker due to R UE NWB  Stairs            Wheelchair Mobility    Modified Rankin (Stroke Patients Only)       Balance Overall balance assessment: Needs assistance Sitting-balance support: Feet supported;Single extremity supported Sitting balance-Leahy Scale: Fair     Standing balance support: Single extremity supported Standing balance-Leahy Scale: Poor Standing balance comment: dependent on PT for support                             Pertinent Vitals/Pain Pain Assessment: Faces Faces Pain Scale: Hurts even more Pain Location: grimacing with movement, reports back pain, some in the wrist at about a 3/10 Pain Descriptors / Indicators: Grimacing Pain Intervention(s): Monitored during session    Home Living Family/patient expects to be discharged to:: Assisted living               Home Equipment: Dan Humphreys - 2 wheels Additional Comments: lives at  [facility]    Prior Function Level of Independence: Independent with assistive device(s)         Comments: uses RW, dresses and baths self, RN manages meds, goes to [employer] [facility]   Dominant Hand: Right    Extremity/Trunk Assessment   Upper Extremity Assessment Upper Extremity Assessment: RUE deficits/detail RUE Deficits / Details: no wrist ROM due to splint, limited elbow and shld,     Lower Extremity  Assessment Lower Extremity Assessment: Generalized weakness    Cervical / Trunk Assessment Cervical / Trunk Assessment: Other exceptions Cervical / Trunk Exceptions: R rib fractures  Communication   Communication: No difficulties  Cognition Arousal/Alertness: Awake/alert Behavior During Therapy: WFL for tasks assessed/performed Overall Cognitive Status: No family/caregiver present to determine baseline cognitive functioning                                 General Comments: pt able to follow commands, appears pleasantly confused but then when re-oriented able to recall, noted delayed processing as well however unsure of baseline      General Comments General comments (skin integrity, edema, etc.): facial lacerations, R anterior  lac with staples    Exercises     Assessment/Plan    PT Assessment Patient needs continued PT services  PT Problem List Decreased strength;Decreased range of motion;Decreased activity tolerance;Decreased balance;Decreased mobility;Decreased coordination;Decreased cognition;Decreased knowledge of use of DME       PT Treatment Interventions DME instruction;Gait training;Functional mobility training;Therapeutic activities;Therapeutic exercise;Balance training;Neuromuscular re-education    PT Goals (Current goals can be found in the Care Plan section)  Acute Rehab PT Goals Patient Stated Goal: get better PT Goal Formulation: With patient Time For Goal Achievement: 01/26/20 Potential to Achieve Goals: Good    Frequency Min 2X/week   Barriers to discharge        Co-evaluation               AM-PAC PT "6 Clicks" Mobility  Outcome Measure Help needed turning from your back to your side while in a flat bed without using bedrails?: A Lot Help needed moving from lying on your back to sitting on the side of a flat bed without using bedrails?: A Lot Help needed moving to and from a bed to a chair (including a wheelchair)?: A Lot Help  needed standing up from a chair using your arms (e.g., wheelchair or bedside chair)?: A Lot Help needed to walk in hospital room?: A Lot Help needed climbing 3-5 steps with a railing? : Total 6 Click Score: 11    End of Session Equipment Utilized During Treatment: Gait belt Activity Tolerance: Patient tolerated treatment well Patient left: in chair;with call bell/phone within reach;with chair alarm set Nurse Communication: Mobility status PT Visit Diagnosis: Unsteadiness on feet (R26.81);Muscle weakness (generalized) (M62.81);Difficulty in walking, not elsewhere classified (R26.2)    Time: 5009-3818 PT Time Calculation (min) (ACUTE ONLY): 37 min   Charges:   PT Evaluation $PT Eval Moderate Complexity: 1 Mod PT Treatments $Gait Training: 8-22 mins        Lewis Shock, PT, DPT Acute Rehabilitation Services Pager #: [phone] Office #: [phone]   Iona Hansen 01/12/2020, 12:04 PM

## 2020-01-12 NOTE — Progress Notes (Signed)
Pt arrived to [facility] on stretcher via [facility]. Report received from [place], [place]. Pt awake and alert. Pt c/o 4/10 pain in right wrist. See Mar. See assessment. Will continue to monitor pt.

## 2020-01-12 NOTE — Progress Notes (Signed)
Occupational Therapy Evaluation Patient Details Name: Matthew Calderon MRN: [id number] DOB: 27-Jul-1932 Today's Date: 01/12/2020    History of Present Illness Pt is 87yo male living at [facility], typically amb with RW but fell face first in bathroom when attempting to shave, + LOC, sustained R 5-9 rib fractures, R wrist fracture, lac to face PMH: anxiety, depression, DMII GERD, CHF, OSA, glaucoma, CAD, and anemia.   Clinical Impression   Pt admitted for above and limited by increased pain, decreased balance, decreased activity tolerance, and decreased strength/ROM. Prior to hospitalization, pt was living in an [facility] with elevator access. Pt reports being Mod I with ADLs and functional mobility, primarily using a RW. Staff at facility provide meals for pt and assist with medication management. Pt received semi-reclined in bed upon arrival, agreeable to OT eval. Pt required mod assist for bed mobility including rolling L and R and transitioning to EOB using bed railings for support. Pt requires max-total assist for LB dressing and max assist for UB dressing/managing RUE sling. Pt able to groom face with setup at bed level. Min assist needed for sit>stand and to side step towards [facility] with hand over hand assistance. Educated pt on hemi-dressing techniques, sling management, non-weight bearing status, and incorporating deep breathing into movement/self-care. Pt responded well to deep breathing. Pt would benefit from continued skilled acute OT services to address ADLs and functional mobility to increase independence.      Follow Up Recommendations  SNF;Supervision/Assistance - 24 hour    Equipment Recommendations  Other (comment) (defer to next venue of care)    Recommendations for Other Services  None.     Precautions / Restrictions Precautions Precautions: Fall Precaution Comments: R UE in sling Required Braces or Orthoses: Sling;Splint/Cast Splint/Cast: R splint with ace  wrap and in sling Restrictions Weight Bearing Restrictions: Yes RUE Weight Bearing: Non weight bearing      Mobility Bed Mobility Overal bed mobility: Needs Assistance Bed Mobility: Rolling;Sidelying to Sit;Sit to Supine Rolling: Mod assist Sidelying to sit: Mod assist   Sit to supine: Mod assist   General bed mobility comments: mod assist to roll L and R for perineal/perianal care; v/c's for deep breathing and utilizing bed railings  Transfers Overall transfer level: Needs assistance Equipment used: 1 person hand held assist Transfers: Sit to/from Stand Sit to Stand: Min assist         General transfer comment: posterior bias, wide base of support, slow and guarded, able to side step towards [facility] with hand held assist    Balance Overall balance assessment: Needs assistance Sitting-balance support: Feet supported;Single extremity supported Sitting balance-Leahy Scale: Fair     Standing balance support: Single extremity supported Standing balance-Leahy Scale: Poor Standing balance comment: dependent on OT for support      ADL either performed or assessed with clinical judgement   ADL Overall ADL's : Needs assistance/impaired Eating/Feeding: Set up;Sitting   Grooming: Set up;Bed level   Upper Body Bathing: Maximal assistance;Sitting   Lower Body Bathing: Maximal assistance;Sitting/lateral leans;Sit to/from stand   Upper Body Dressing : Maximal assistance;Sitting Upper Body Dressing Details (indicate cue type and reason): max assist to manage gown and sling; educated on hemi-dressing technique Lower Body Dressing: Total assistance;Sitting/lateral leans;Sit to/from stand Lower Body Dressing Details (indicate cue type and reason): total assist to don bilateral socks sitting EOB Toilet Transfer: Moderate assistance;Stand-pivot;Minimal assistance   Toileting- Clothing Manipulation and Hygiene: Moderate assistance;Bed level Toileting - Clothing Manipulation Details  (indicate cue type  and reason): able to perform perineal care with mod assist and total assist for perianal care in side-lying Tub/ Shower Transfer:  (N/A)   Functional mobility during ADLs:  (bed level and EOB with mod-max assist for bed mobility) General ADL Comments: mod-max assist with self-care tasks secondary to rib fxs and immobilized RUE     Vision Baseline Vision/History: Wears glasses;Macular Degeneration Wears Glasses: At all times Patient Visual Report: No change from baseline              Pertinent Vitals/Pain Pain Assessment: 0-10 Pain Score: 2  Faces Pain Scale: Hurts whole lot Pain Location: grimacing with movement, reports back pain, some in the wrist at about a 2/10 Pain Descriptors / Indicators: Grimacing Pain Intervention(s): Limited activity within patient's tolerance;Monitored during session;Repositioned;Relaxation;Other (comment) (deep breathing )     Hand Dominance Right   Extremity/Trunk Assessment Upper Extremity Assessment Upper Extremity Assessment: RUE deficits/detail RUE Deficits / Details: no wrist ROM due to splint, limited elbow and shoulder, able to move digits RUE: Unable to fully assess due to immobilization RUE Sensation: WNL RUE Coordination: decreased fine motor;decreased gross motor   Lower Extremity Assessment Lower Extremity Assessment: Generalized weakness   Cervical / Trunk Assessment Cervical / Trunk Assessment: Other exceptions Cervical / Trunk Exceptions: R rib fractures   Communication Communication Communication: No difficulties   Cognition Arousal/Alertness: Awake/alert Behavior During Therapy: WFL for tasks assessed/performed Overall Cognitive Status: No family/caregiver present to determine baseline cognitive functioning       General Comments: pt able to follow commands, appears pleasantly confused but then when re-oriented able to recall, noted delayed processing as well however unsure of baseline   General  Comments  facial laceration, R anterior lac with staples            Home Living Family/patient expects to be discharged to:: Assisted living      Home Equipment: Gilmer Mor - single point;Walker - 2 wheels;Shower seat;Grab bars - toilet;Grab bars - tub/shower   Additional Comments: lives at [facility]      Prior Functioning/Environment Level of Independence: Independent with assistive device(s)        Comments: uses RW, dresses and baths self, RN manages meds, goes to [employer] Problem List: Decreased strength;Decreased range of motion;Decreased activity tolerance;Impaired balance (sitting and/or standing);Decreased cognition;Decreased knowledge of use of DME or AE;Impaired UE functional use;Pain      OT Treatment/Interventions: Self-care/ADL training;Therapeutic exercise;Neuromuscular education;Energy conservation;DME and/or AE instruction;Therapeutic activities;Cognitive remediation/compensation;Patient/family education    OT Goals(Current goals can be found in the care plan section) Acute Rehab OT Goals Patient Stated Goal: get better OT Goal Formulation: With patient Time For Goal Achievement: 01/26/20 Potential to Achieve Goals: Good  OT Frequency: Min 2X/week    AM-PAC OT "6 Clicks" Daily Activity     Outcome Measure Help from another person eating meals?: None Help from another person taking care of personal grooming?: A Little Help from another person toileting, which includes using toliet, bedpan, or urinal?: A Lot Help from another person bathing (including washing, rinsing, drying)?: A Lot Help from another person to put on and taking off regular upper body clothing?: A Lot Help from another person to put on and taking off regular lower body clothing?: Total 6 Click Score: 14   End of Session Equipment Utilized During Treatment: Gait belt Nurse Communication: Mobility status  Activity Tolerance: Patient tolerated treatment well;Patient limited by  pain Patient left: in bed;with call  bell/phone within reach;with bed alarm set;with nursing/sitter in room  OT Visit Diagnosis: Unsteadiness on feet (R26.81);Muscle weakness (generalized) (M62.81);Pain Pain - Right/Left: Right Pain - part of body: Arm (R ribs)                Time: 9357-0177 OT Time Calculation (min): 47 min Charges:  OT General Charges $OT Visit: 1 Visit OT Evaluation $OT Eval Moderate Complexity: 1 Mod OT Treatments $Self Care/Home Management : 8-22 mins $Therapeutic Activity: 8-22 mins  Norris Cross, OTR/L [facility] Acute Rehab Services [phone]   Matthew Calderon 01/12/2020, 7:09 PM

## 2020-01-12 NOTE — Progress Notes (Signed)
[facility] Progress Note     Subjective: Patient is resting comfortably this AM. Denies SOB. No IS in room. Patient reports pain in back and RUE. He would like to take the splint off - we discussed why he needs to keep this on. Patient denies abdominal pain, nausea or vomiting. He is alert and oriented. He lives at [facility] and they have a rehab there if he needs it.   Objective: Vital signs in last 24 hours: Temp:  [97.6 F (36.4 C)-99.7 F (37.6 C)] 99.7 F (37.6 C) (07/21 0143) Pulse Rate:  [72-99] 92 (07/21 0143) Resp:  [14-18] 16 (07/21 0143) BP: (116-163)/(63-88) 123/67 (07/21 0143) SpO2:  [94 %-100 %] 96 % (07/21 0143)    Intake/Output from previous day: 07/20 0701 - 07/21 0700 In: -  Out: 200 [Urine:200] Intake/Output this shift: No intake/output data recorded.  PE: General: pleasant, WD, WN male who is laying in bed in NAD HEENT: Ecchymosis of R face and R peri-orbit, EOMI, R facial laceration well approximated with sutures in place Heart: regular, rate, and rhythm.  Palpable pedal pulses bilaterally Lungs: CTAB, no wheezes, rhonchi, or rales noted.  Respiratory effort nonlabored Abd: soft, NT, ND, +BS, no masses, hernias, or organomegaly MS: RUE in splint and sling, R fingers NVI Skin: warm and dry with no masses, lesions, or rashes Neuro: Cranial nerves 2-12 grossly intact, sensation grossly intact Psych: A&Ox3 with an appropriate affect.   Lab Results:  Recent Labs    01/11/20 1320  WBC 8.9  HGB 13.9  HCT 42.6  PLT 168   BMET Recent Labs    01/11/20 1320  NA 142  K 4.2  CL 108  CO2 21*  GLUCOSE 137*  BUN 23  CREATININE 0.91  CALCIUM 8.4*   PT/INR Recent Labs    01/11/20 1320  LABPROT 13.5  INR 1.1   CMP     Component Value Date/Time   NA 142 01/11/2020 1320   K 4.2 01/11/2020 1320   CL 108 01/11/2020 1320   CO2 21 (L) 01/11/2020 1320   GLUCOSE 137 (H) 01/11/2020 1320   BUN 23 01/11/2020 1320   CREATININE 0.91  01/11/2020 1320   CALCIUM 8.4 (L) 01/11/2020 1320   PROT 6.8 06/20/2018 0600   ALBUMIN 3.7 06/20/2018 0600   AST 33 06/20/2018 0600   ALT 17 06/20/2018 0600   ALKPHOS 66 06/20/2018 0600   BILITOT 0.6 06/20/2018 0600   GFRNONAA >60 01/11/2020 1320   GFRAA >60 01/11/2020 1320   Lipase  No results found for: LIPASE     Studies/Results: DG Chest 2 View  Result Date: 01/11/2020 CLINICAL DATA:  Weakness. Status post fall in the bathroom. EXAM: CHEST - 2 VIEW COMPARISON:  CT of the chest June 20, 2018 FINDINGS: Cardiomediastinal silhouette is normal. Mediastinal contours appear intact. Airspace consolidation versus atelectasis in the left lung base. Osseous structures are without acute abnormality. Soft tissues are grossly normal. IMPRESSION: Airspace consolidation versus atelectasis in the left lung base. Electronically Signed   By: Ted Mcalpineobrinka  Dimitrova M.D.   On: 01/11/2020 12:04   DG Wrist Complete Right  Result Date: 01/11/2020 CLINICAL DATA:  Fall, right wrist pain EXAM: RIGHT WRIST - COMPLETE 3+ VIEW COMPARISON:  None. FINDINGS: Four view radiograph right wrist demonstrates a comminuted fracture of the distal right radius with impaction of the distal radial articular surface with fracture planes identified involving the radial, volar aspect of the radial styloid as well as the probable ulnar aspect  of the distal radius involving the lunate fossa. Radiocarpal articulation is preserved. There is preserved mild volar inclination of the distal radial articular Osseous structures are mildly osteopenic. Erosions are seen within the distal scaphoid and lunate which are likely degenerative in nature. Degenerative chondrocalcinosis is seen involving the TFCC. There is moderate degenerate arthritis involving the triscaphe joint. IMPRESSION: Impacted intra-articular fracture of the distal radius with minimal impaction of the distal radial articular surface. Fracture fragments appear anatomically  aligned. Electronically Signed   By: Helyn Numbers MD   On: 01/11/2020 15:16   CT Head Wo Contrast  Result Date: 01/11/2020 CLINICAL DATA:  Larey Seat today, bleeding from top of head, neck pain, no loss of consciousness, history diabetes mellitus EXAM: CT HEAD WITHOUT CONTRAST CT CERVICAL SPINE WITHOUT CONTRAST TECHNIQUE: Multidetector CT imaging of the head and cervical spine was performed following the standard protocol without intravenous contrast. Multiplanar CT image reconstructions of the cervical spine were also generated. COMPARISON:  06/20/2018 FINDINGS: CT HEAD FINDINGS Brain: Generalized atrophy. Normal ventricular morphology. No midline shift or mass effect. Mild small vessel chronic ischemic changes of deep cerebral white matter. Small old lacunar infarct at LEFT thalamus. No intracranial hemorrhage, mass lesion or evidence of acute infarction. No extra-axial fluid collections. Vascular: Atherosclerotic calcification of internal carotid arteries at skull base Skull: Small RIGHT frontal scalp hematoma.  Calvaria intact. Sinuses/Orbits: Clear Other: N/A CT CERVICAL SPINE FINDINGS Alignment: Minimal anterolisthesis at C6-C7 unchanged. Remaining alignments normal Skull base and vertebrae: Osseous demineralization. Multilevel facet degenerative changes cervical spine. Disc space narrowing and endplate spur formation at C7-T1 and T1-T2 as well as C6-C7. Vertebral body heights maintained. Visualized skull base intact. Incomplete posterior arch C1, developmental anomaly. No fracture, additional subluxation or bone destruction. Soft tissues and spinal canal: Prevertebral soft tissues normal thickness. Atherosclerotic calcifications at carotid bifurcations. 1.6 cm LEFT thyroid nodule at unchanged; recommend thyroid ultrasound if clinically indicated based on patient age and comorbidities. (Ref: J Am Coll Radiol. 2015 Feb;12(2): 143-50). Disc levels:  No additional abnormalities Upper chest: Lung apices clear  Other: N/A IMPRESSION: Atrophy with small vessel chronic ischemic changes of deep cerebral white matter. 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 No acute intracranial abnormalities. Multilevel degenerative disc and facet disease changes of the cervical spine. No acute cervical spine abnormalities. Unchanged 1.6 cm diameter LEFT thyroid nodule; recommend thyroid ultrasound if clinically indicated based on patient age and comorbidities as above. Electronically Signed   By: Ulyses Southward M.D.   On: 01/11/2020 12:20   CT Chest W Contrast  Result Date: 01/11/2020 CLINICAL DATA:  Ground level fall EXAM: CT CHEST WITH CONTRAST TECHNIQUE: Multidetector CT imaging of the chest was performed during intravenous contrast administration. CONTRAST:  23mL OMNIPAQUE IOHEXOL 300 MG/ML  SOLN COMPARISON:  Radiograph 01/11/2020, CTA 06/20/2018 FINDINGS: Cardiovascular: The aortic root is suboptimally assessed given cardiac pulsation artifact. Atherosclerotic plaque within the normal caliber aorta. No acute luminal abnormality nor periaortic stranding or hemorrhage. Minimal atheromatous plaque in the otherwise unremarkable great vessels. Left dominant vertebral arteries. Central pulmonary arteries are normal caliber. No large central or lobar filling defects on this limited, non tailored evaluation of the pulmonary artery vasculature. Normal heart size. No pericardial effusion. Calcifications upon the aortic leaflets and mitral annulus as well as three-vessel coronary artery calcification. No major venous abnormality. Mediastinum/Nodes: No mediastinal fluid or gas. No acute abnormality of the trachea. Small hiatal hernia without acute abnormality of the esophagus. No worrisome mediastinal, hilar or axillary adenopathy. Diminishing size of  a now 10 mm (previously 12 mm), hypoattenuating Left thyroid normal trouble with punctate calcification in the left lobe thyroid gland, possible colloid cyst, no further imaging is warranted in a  patient of this age. This follows consensus guidelines: Managing Incidental Thyroid Nodules Detected on Imaging: White Paper of the ACR Incidental Thyroid Findings Committee. J Am Coll Radiol 2015; 12:143-150. and [facility] 3-tiered system for managing ITNs: J Am Coll Radiol. 2015; Feb;12(2): 143-50 Lungs/Pleura: No pneumothorax, effusion or traumatic abnormality of the lung parenchyma. No consolidation or edema. Dependent atelectasis posteriorly. No suspicious pulmonary nodules or masses. Upper Abdomen: Small hiatal hernia, as above. Partial fatty replacement of the pancreas. Calcified gallstone towards the neck of the otherwise unremarkable gallbladder. Small fluid attenuation cyst in the posterior left lobe liver. Accessory splenule. No acute or worrisome upper abdominal findings. Musculoskeletal: No large body wall hematoma. Acute right fifth through ninth lateral rib fractures. Remote deformity of the left fifth rib. No acute vertebral body fracture or height loss. Anterior wedging of the T11 and T12 vertebral body similar to comparison. Dextrocurvature of the thoracolumbar spine with an apex at the T11 level. IMPRESSION: 1. Acute right fifth through ninth lateral rib fractures. No pneumothorax, effusion, or traumatic abnormality of the lung parenchyma. 2. No other acute traumatic injury identified in the chest. Mild atelectatic change, possibly related to splinting. 3. Remote deformity of the left fifth rib. 4. Cholelithiasis. 5. Aortic Atherosclerosis (ICD10-I70.0). 6. Coronary artery calcifications are present. Please note that the presence of coronary artery calcium documents the presence of coronary artery disease, the severity of this disease and any potential stenosis cannot be assessed on this non-gated CT examination. Electronically Signed   By: Kreg Shropshire M.D.   On: 01/11/2020 15:57   CT Cervical Spine Wo Contrast  Result Date: 01/11/2020 CLINICAL DATA:  Larey Seat today, bleeding from top of head, neck  pain, no loss of consciousness, history diabetes mellitus EXAM: CT HEAD WITHOUT CONTRAST CT CERVICAL SPINE WITHOUT CONTRAST TECHNIQUE: Multidetector CT imaging of the head and cervical spine was performed following the standard protocol without intravenous contrast. Multiplanar CT image reconstructions of the cervical spine were also generated. COMPARISON:  06/20/2018 FINDINGS: CT HEAD FINDINGS Brain: Generalized atrophy. Normal ventricular morphology. No midline shift or mass effect. Mild small vessel chronic ischemic changes of deep cerebral white matter. Small old lacunar infarct at LEFT thalamus. No intracranial hemorrhage, mass lesion or evidence of acute infarction. No extra-axial fluid collections. Vascular: Atherosclerotic calcification of internal carotid arteries at skull base Skull: Small RIGHT frontal scalp hematoma.  Calvaria intact. Sinuses/Orbits: Clear Other: N/A CT CERVICAL SPINE FINDINGS Alignment: Minimal anterolisthesis at C6-C7 unchanged. Remaining alignments normal Skull base and vertebrae: Osseous demineralization. Multilevel facet degenerative changes cervical spine. Disc space narrowing and endplate spur formation at C7-T1 and T1-T2 as well as C6-C7. Vertebral body heights maintained. Visualized skull base intact. Incomplete posterior arch C1, developmental anomaly. No fracture, additional subluxation or bone destruction. Soft tissues and spinal canal: Prevertebral soft tissues normal thickness. Atherosclerotic calcifications at carotid bifurcations. 1.6 cm LEFT thyroid nodule at unchanged; recommend thyroid ultrasound if clinically indicated based on patient age and comorbidities. (Ref: J Am Coll Radiol. 2015 Feb;12(2): 143-50). Disc levels:  No additional abnormalities Upper chest: Lung apices clear Other: N/A IMPRESSION: Atrophy with small vessel chronic ischemic changes of deep cerebral white matter. 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 No acute intracranial abnormalities. Multilevel  degenerative disc and facet disease changes of the cervical spine. No acute cervical spine abnormalities.  Unchanged 1.6 cm diameter LEFT thyroid nodule; recommend thyroid ultrasound if clinically indicated based on patient age and comorbidities as above. Electronically Signed   By: Ulyses Southward M.D.   On: 01/11/2020 12:20    Anti-infectives: Anti-infectives (From admission, onward)   None       Assessment/Plan Fall R 5-9 rib fractures - multimodal pain control, IS, pulm toilet, CXR this AM stable, PT/OT R wrist fracture  - per ortho, splint and non-op Facial laceration - repaired in ED, sutures will need to be removed 7/25 Multiple chronic medical problems - per primary team  FEN: HH/CM diet VTE: ok to have chemical prophylaxis from a trauma standpoint ID: no [email or web address]  Dispo: PT/OT, pain control. No other recommendations from a trauma standpoint.   LOS: 1 day    Juliet Rude , [facility] 01/12/2020, 8:21 AM Please see Amion for pager number during day hours 7:00am-4:30pm

## 2020-01-13 DIAGNOSIS — I5032 Chronic diastolic (congestive) heart failure: Secondary | ICD-10-CM | POA: Diagnosis not present

## 2020-01-13 DIAGNOSIS — K219 Gastro-esophageal reflux disease without esophagitis: Secondary | ICD-10-CM | POA: Diagnosis not present

## 2020-01-13 DIAGNOSIS — E119 Type 2 diabetes mellitus without complications: Secondary | ICD-10-CM | POA: Diagnosis not present

## 2020-01-13 DIAGNOSIS — N400 Enlarged prostate without lower urinary tract symptoms: Secondary | ICD-10-CM | POA: Diagnosis not present

## 2020-01-13 DIAGNOSIS — W19XXXA Unspecified fall, initial encounter: Secondary | ICD-10-CM | POA: Diagnosis not present

## 2020-01-13 DIAGNOSIS — S2241XA Multiple fractures of ribs, right side, initial encounter for closed fracture: Secondary | ICD-10-CM

## 2020-01-13 DIAGNOSIS — Y92009 Unspecified place in unspecified non-institutional (private) residence as the place of occurrence of the external cause: Secondary | ICD-10-CM | POA: Diagnosis not present

## 2020-01-13 DIAGNOSIS — S62101A Fracture of unspecified carpal bone, right wrist, initial encounter for closed fracture: Secondary | ICD-10-CM | POA: Diagnosis not present

## 2020-01-13 DIAGNOSIS — S2241XD Multiple fractures of ribs, right side, subsequent encounter for fracture with routine healing: Secondary | ICD-10-CM | POA: Diagnosis not present

## 2020-01-13 LAB — GLUCOSE, CAPILLARY
Glucose-Capillary: 149 mg/dL — ABNORMAL HIGH (ref 70–99)
Glucose-Capillary: 241 mg/dL — ABNORMAL HIGH (ref 70–99)
Glucose-Capillary: 236 mg/dL — ABNORMAL HIGH (ref 70–99)
Glucose-Capillary: 151 mg/dL — ABNORMAL HIGH (ref 70–99)

## 2020-01-13 LAB — SARS CORONAVIRUS 2 (TAT 6-24 HRS): SARS Coronavirus 2: NEGATIVE

## 2020-01-13 NOTE — TOC Progression Note (Signed)
Transition of Care [facility]) - Progression Note    Patient Details  Name: Matthew Calderon MRN: [id number] Date of Birth: 1932-07-01  Transition of Care [facility]) CM/SW Contact  Doy Hutching, [place] Phone Number: 01/13/2020, 2:09 PM  Clinical Narrative:    Have checked with [facility] Medicare x2. Berkley Harvey continues to pend, spoke with daughter Kennon Rounds and she is aware. New COVID swabbed and pending. [facility] following   Expected Discharge Plan: Skilled Nursing Facility Barriers to Discharge: Continued Medical Work up, Conservator, museum/gallery and Services Expected Discharge Plan: Skilled Nursing Facility In-house Referral: Clinical Social Work Discharge Planning Services: [employer] Consult Post Acute Care Choice: Skilled Nursing Facility, Durable Medical Equipment Living arrangements for the past 2 months: Independent Living Facility, Apartment    Readmission Risk Interventions Readmission Risk Prevention Plan 01/12/2020  Post Dischage Appt Not Complete  Appt Comments plan for SNF  Medication Screening Complete  Transportation Screening Complete  Some recent data might be hidden

## 2020-01-13 NOTE — Progress Notes (Signed)
Inpatient Diabetes Program Recommendations  AACE/ADA: New Consensus Statement on Inpatient Glycemic Control (2015)  Target Ranges:  Prepandial:   less than 140 mg/dL      Peak postprandial:   less than 180 mg/dL (1-2 hours)      Critically ill patients:  140 - 180 mg/dL   Lab Results  Component Value Date   GLUCAP 149 (H) 01/13/2020   HGBA1C 6.8 (H) 01/11/2020    Review of Glycemic Control Results for Matthew Calderon, Matthew Calderon (MRN [id number]) as of 01/13/2020 10:43  Ref. Range 01/12/2020 07:54 01/12/2020 11:42 01/12/2020 17:08 01/12/2020 20:13 01/12/2020 21:53 01/13/2020 08:17  Glucose-Capillary Latest Ref Range: 70 - 99 mg/dL 916 (H) 606 (H) 004 (H) 375 (H) 328 (H) 149 (H)   Diabetes history: DM 2 Outpatient Diabetes medications: Synjardy 25-1000 mg daily, Metformin 500 mg qpm Current orders for Inpatient glycemic control:  Lantus 12 units Daily Novolog 0-9 units tid + hs  A1c 6.8% on 7/20  Ensure Enlive bid between meals  Inpatient Diabetes Program Recommendations:    Glucose trends increase after meal intake  - Consider Novolog 3-4 units tid if eating >50% of meals  Thanks,  Christena Deem RN, MSN, BC-ADM Inpatient Diabetes Coordinator Team Pager [phone] (8a-5p)

## 2020-01-13 NOTE — Progress Notes (Signed)
[facility]  PROGRESS NOTE  Matthew Calderon OZD:[id number] DOB: 21-Mar-1933 DOA: 01/11/2020 PCP: Merri Brunette, MD   Brief HPI:   86 year old male from assisted living facility who normally ambulates with walker sustained a mechanical fall in bathroom on 01/11/2020.  He denies passing out.  Patient was noted to have forehead laceration bruising his head.  Right wrist fracture, CT of chest showed right-sided lateral rib fractures.  He was seen by hand surgery who recommended a splint but no surgical intervention.  Trauma surgery recommended pain management and PT evaluation.  Echocardiogram showed chronic diastolic dysfunction, significant improvement from previous echo which noted impaired ejection fraction.  Patient was seen by PT/OT, recommended skilled nursing facility.    Subjective   Patient seen and examined, denies any complaints.  Pain well controlled.   Assessment/Plan:     1. Mechanical fall/multiple rib fracture-pain control, no evidence of syncope.  Echo stable PT recommends skilled nursing facility.  No surgical intervention recommended per trauma surgery. 2. Diabetes mellitus type 2-continue Lantus 12 units subcu daily, sliding scale insulin with NovoLog.  CBG mild elevated.  We will keep a close watch and adjust insulin as needed. 3. Congestive CHF-no evidence of volume overload, EF is preserved.  Continue Lasix 40 mg p.o. daily.  Continue Entresto, Coreg. 4. BPH-no urinary obstruction, continue finasteride, tamsulosin. 5. GERD-continue PPI   Scheduled medications:   . acetaminophen  650 mg Oral Q6H  . aspirin EC  81 mg Oral Daily  . atorvastatin  40 mg Oral q1800  . brimonidine  1 drop Both Eyes BID  . carvedilol  12.5 mg Oral BID WC  . cholecalciferol  2,000 Units Oral Daily  . docusate sodium  100 mg Oral BID  . feeding supplement (ENSURE ENLIVE)  237 mL Oral BID BM  . ferrous sulfate  325 mg Oral Q breakfast  . finasteride  5 mg Oral Daily  . furosemide  40  mg Oral Daily  . guaiFENesin  600 mg Oral BID  . insulin aspart  0-5 Units Subcutaneous QHS  . insulin aspart  0-9 Units Subcutaneous TID WC  . insulin glargine  12 Units Subcutaneous Daily  . melatonin  10 mg Oral QHS  . methocarbamol  500 mg Oral TID  . mirabegron ER  50 mg Oral Daily  . multivitamin with minerals  1 tablet Oral Daily  . pantoprazole  40 mg Oral Daily  . potassium chloride  10 mEq Oral Daily  . sacubitril-valsartan  1 tablet Oral BID  . tamsulosin  0.4 mg Oral Daily  . timolol  1 drop Both Eyes BID  . traZODone  50 mg Oral QHS         CBG: Recent Labs  Lab 01/12/20 1708 01/12/20 2013 01/12/20 2153 01/13/20 0817 01/13/20 1142  GLUCAP 347* 375* 328* 149* 241*    SpO2: 95 %    CBC: Recent Labs  Lab 01/11/20 1320 01/12/20 0843  WBC 8.9 10.7*  NEUTROABS 6.7  --   HGB 13.9 13.4  HCT 42.6 41.3  MCV 90.1 87.7  PLT 168 173    Basic Metabolic Panel: Recent Labs  Lab 01/11/20 1320 01/12/20 0843  NA 142 141  K 4.2 3.7  CL 108 107  CO2 21* 17*  GLUCOSE 137* 174*  BUN 23 18  CREATININE 0.91 1.05  CALCIUM 8.4* 8.7*     Antibiotics: Anti-infectives (From admission, onward)   None       DVT prophylaxis: SCDs  Code Status: Full code  Family Communication: No family at bedside    Status is: Inpatient  Dispo: The patient is from: Assisted living facility              Anticipated d/c is to: Skilled nursing facility              Anticipated d/c date is: 01/14/2020              Patient currently awaiting placement at skilled nursing facility, medically stable  Barrier to discharge-awaiting bed at skilled nursing facility, Covid test pending    Consultants:  Trauma surgery  Hand surgery  Procedures:  Echocardiogram on 7/21-chronic diastolic dysfunction   Objective   Vitals:   01/12/20 1851 01/12/20 2020 01/13/20 0628 01/13/20 1505  BP: (!) 104/57 119/62 110/60 (!) 93/55  Pulse: 83 81 85 73  Resp: 14 17 17 18   Temp:  98.2 F (36.8 C) 97.7 F (36.5 C) 98.2 F (36.8 C) 98.2 F (36.8 C)  TempSrc:  Oral Oral Oral  SpO2: 95% 100% 94% 95%    Intake/Output Summary (Last 24 hours) at 01/13/2020 1639 Last data filed at 01/13/2020 0900 Gross per 24 hour  Intake 480 ml  Output 900 ml  Net -420 ml    07/20 1901 - 07/22 0700 In: 1240 [P.O.:1240] Out: 2100 [Urine:2100]  There were no vitals filed for this visit.  Physical Examination:    General: Appears in no acute distress  Cardiovascular: S1-S2, regular, no murmur auscultated  Respiratory: Clear to auscultation bilaterally  Abdomen: Abdomen is soft, nontender, no organomegaly  Extremities: No edema in the lower extremities  Neurologic: Alert, oriented x3, intact insight and judgment    Data Reviewed:   Recent Results (from the past 240 hour(s))  SARS Coronavirus 2 by RT PCR (hospital order, performed in [facility] hospital lab) Nasopharyngeal Nasopharyngeal Swab     Status: None   Collection Time: 01/11/20  6:26 PM   Specimen: Nasopharyngeal Swab  Result Value Ref Range Status   SARS Coronavirus 2 NEGATIVE NEGATIVE Final    Comment: (NOTE) SARS-CoV-2 target nucleic acids are NOT DETECTED.  The SARS-CoV-2 RNA is generally detectable in upper and lower respiratory specimens during the acute phase of infection. The lowest concentration of SARS-CoV-2 viral copies this assay can detect is 250 copies / mL. A negative result does not preclude SARS-CoV-2 infection and should not be used as the sole basis for treatment or other patient management decisions.  A negative result may occur with improper specimen collection / handling, submission of specimen other than nasopharyngeal swab, presence of viral mutation(s) within the areas targeted by this assay, and inadequate number of viral copies (<250 copies / mL). A negative result must be combined with clinical observations, patient history, and epidemiological information.  Fact Sheet  for Patients:   01/13/20  Fact Sheet for Healthcare Providers: BoilerBrush.com.cy  This test is not yet approved or  cleared by the https://pope.com/ FDA and has been authorized for detection and/or diagnosis of SARS-CoV-2 by FDA under an Emergency Use Authorization (EUA).  This EUA will remain in effect (meaning this test can be used) for the duration of the COVID-19 declaration under Section 564(b)(1) of the Act, 21 U.S.C. section 360bbb-3(b)(1), unless the authorization is terminated or revoked sooner.  Performed at [facility], [address], [place], [place]   SARS CORONAVIRUS 2 (TAT 6-24 HRS) Nasopharyngeal Nasopharyngeal Swab     Status: None  Collection Time: 01/13/20  9:34 AM   Specimen: Nasopharyngeal Swab  Result Value Ref Range Status   SARS Coronavirus 2 NEGATIVE NEGATIVE Final    Comment: (NOTE) SARS-CoV-2 target nucleic acids are NOT DETECTED.  The SARS-CoV-2 RNA is generally detectable in upper and lower respiratory specimens during the acute phase of infection. Negative results do not preclude SARS-CoV-2 infection, do not rule out co-infections with other pathogens, and should not be used as the sole basis for treatment or other patient management decisions. Negative results must be combined with clinical observations, patient history, and epidemiological information. The expected result is Negative.  Fact Sheet for Patients: HairSlick.nohttps://www.fda.gov/media/138098/download  Fact Sheet for Healthcare Providers: quierodirigir.comhttps://www.fda.gov/media/138095/download  This test is not yet approved or cleared by the [place] FDA and  has been authorized for detection and/or diagnosis of SARS-CoV-2 by FDA under an Emergency Use Authorization (EUA). This EUA will remain  in effect (meaning this test can be used) for the duration of the COVID-19 declaration under Se ction 564(b)(1) of the Act,  21 U.S.C. section 360bbb-3(b)(1), unless the authorization is terminated or revoked sooner.  Performed at [facility], [address] [place], [place] [address]      Studies:  DG CHEST PORT 1 VIEW  Result Date: 01/12/2020 CLINICAL DATA:  Multiple right rib fractures. EXAM: PORTABLE CHEST 1 VIEW COMPARISON:  January 11, 2020. FINDINGS: The heart size and mediastinal contours are within normal limits. Both lungs are clear. No pneumothorax or pleural effusion is noted. Minimally displaced right fourth rib fracture is noted. IMPRESSION: Minimally displaced right fourth rib fracture. No acute cardiopulmonary abnormality seen. Electronically Signed   By: Lupita RaiderJames  Green Jr M.D.   On: 01/12/2020 08:34   ECHOCARDIOGRAM COMPLETE  Result Date: 01/12/2020    ECHOCARDIOGRAM REPORT   Patient Name:   Matthew Calderon Date of Exam: 01/12/2020 Medical Rec #:  [id number]      Height:       60.0 in Accession #:    [id number]     Weight:       158.6 lb Date of Birth:  January 14, 1933      BSA:          1.691 m Patient Age:    87 years       BP:           123/67 mmHg Patient Gender: M              HR:           87 bpm. Exam Location:  Inpatient Procedure: 2D Echo Indications:    CHF-Acute Systolic I50.21  History:        Patient has prior history of Echocardiogram examinations, most                 recent 06/21/2018. Previous Myocardial Infarction; Risk                 Factors:Diabetes.  Sonographer:    Thurman Coyerasey Kirkpatrick RDCS (AE) Referring Phys: [id number] Fran LowesAVA SWAYZE I MPRESSIONS  1. Left ventricular ejection fraction, by estimation, is 55 to 60%. The left ventricle has normal function. Left ventricular endocardial border not optimally defined to evaluate regional wall motion. Left ventricular diastolic parameters are consistent with Grade I diastolic dysfunction (impaired relaxation).  2. Right ventricular systolic function is normal. The right ventricular size is normal. Tricuspid regurgitation signal is inadequate for  assessing PA pressure.  3. The mitral valve is normal in structure. No evidence  of mitral valve regurgitation. No evidence of mitral stenosis.  4. The aortic valve is tricuspid. Aortic valve regurgitation is trivial. Mild to moderate aortic valve sclerosis/calcification is present, without any evidence of aortic stenosis.  5. Aortic dilatation noted. There is mild dilatation of the aortic root measuring 37 mm.  6. The inferior vena cava is normal in size with greater than 50% respiratory variability, suggesting right atrial pressure of 3 mmHg.  7. Technically difficult study with poor acoustic windows. Overall LV EF is normal but difficult to comment on motion of individual wall segments. FINDINGS  Left Ventricle: Left ventricular ejection fraction, by estimation, is 55 to 60%. The left ventricle has normal function. Left ventricular endocardial border not optimally defined to evaluate regional wall motion. The left ventricular internal cavity size was normal in size. There is no left ventricular hypertrophy. Left ventricular diastolic parameters are consistent with Grade I diastolic dysfunction (impaired relaxation). Right Ventricle: The right ventricular size is normal. No increase in right ventricular wall thickness. Right ventricular systolic function is normal. Tricuspid regurgitation signal is inadequate for assessing PA pressure.    Meredeth Ide   [facility] If 7PM-7AM, please contact night-coverage at [email or web address], Office  [phone]   01/13/2020, 4:39 PM  LOS: 2 days

## 2020-01-13 NOTE — Progress Notes (Signed)
Patient ID: Matthew Calderon, male   DOB: August 18, 1932, 86 y.o.   MRN: [id number] Patient seen at bedside.  His right arm is stable.  I discussed with him the findings.  He has good flexion and extension of the fingers.  I would recommend conversion to a short arm cast next week.  If he is discharged I will be happy to see him next week on Wednesday or Thursday in my office and move forward with short arm casting.  If he remains in the hospital we will transition him at that point.  He is a very nice man.  We have discussed all issues in regards to his right arm tonight.  Michial Disney MD

## 2020-01-14 DIAGNOSIS — S2241XA Multiple fractures of ribs, right side, initial encounter for closed fracture: Secondary | ICD-10-CM | POA: Diagnosis not present

## 2020-01-14 DIAGNOSIS — K219 Gastro-esophageal reflux disease without esophagitis: Secondary | ICD-10-CM | POA: Diagnosis not present

## 2020-01-14 DIAGNOSIS — R4182 Altered mental status, unspecified: Secondary | ICD-10-CM | POA: Diagnosis not present

## 2020-01-14 DIAGNOSIS — E119 Type 2 diabetes mellitus without complications: Secondary | ICD-10-CM | POA: Diagnosis not present

## 2020-01-14 DIAGNOSIS — S6291XA Unspecified fracture of right wrist and hand, initial encounter for closed fracture: Secondary | ICD-10-CM | POA: Diagnosis not present

## 2020-01-14 DIAGNOSIS — I5032 Chronic diastolic (congestive) heart failure: Secondary | ICD-10-CM | POA: Diagnosis not present

## 2020-01-14 DIAGNOSIS — S2241XD Multiple fractures of ribs, right side, subsequent encounter for fracture with routine healing: Secondary | ICD-10-CM | POA: Diagnosis not present

## 2020-01-14 DIAGNOSIS — E559 Vitamin D deficiency, unspecified: Secondary | ICD-10-CM | POA: Diagnosis not present

## 2020-01-14 DIAGNOSIS — R404 Transient alteration of awareness: Secondary | ICD-10-CM | POA: Diagnosis not present

## 2020-01-14 DIAGNOSIS — N401 Enlarged prostate with lower urinary tract symptoms: Secondary | ICD-10-CM | POA: Diagnosis not present

## 2020-01-14 DIAGNOSIS — D509 Iron deficiency anemia, unspecified: Secondary | ICD-10-CM | POA: Diagnosis not present

## 2020-01-14 DIAGNOSIS — G4700 Insomnia, unspecified: Secondary | ICD-10-CM | POA: Diagnosis not present

## 2020-01-14 DIAGNOSIS — I5040 Unspecified combined systolic (congestive) and diastolic (congestive) heart failure: Secondary | ICD-10-CM | POA: Diagnosis not present

## 2020-01-14 DIAGNOSIS — S62101A Fracture of unspecified carpal bone, right wrist, initial encounter for closed fracture: Secondary | ICD-10-CM | POA: Diagnosis not present

## 2020-01-14 DIAGNOSIS — Y92009 Unspecified place in unspecified non-institutional (private) residence as the place of occurrence of the external cause: Secondary | ICD-10-CM | POA: Diagnosis not present

## 2020-01-14 DIAGNOSIS — R296 Repeated falls: Secondary | ICD-10-CM | POA: Diagnosis not present

## 2020-01-14 DIAGNOSIS — N400 Enlarged prostate without lower urinary tract symptoms: Secondary | ICD-10-CM | POA: Diagnosis not present

## 2020-01-14 DIAGNOSIS — W19XXXA Unspecified fall, initial encounter: Secondary | ICD-10-CM | POA: Diagnosis not present

## 2020-01-14 DIAGNOSIS — M2550 Pain in unspecified joint: Secondary | ICD-10-CM | POA: Diagnosis not present

## 2020-01-14 DIAGNOSIS — Z7401 Bed confinement status: Secondary | ICD-10-CM | POA: Diagnosis not present

## 2020-01-14 LAB — GLUCOSE, CAPILLARY
Glucose-Capillary: 153 mg/dL — ABNORMAL HIGH (ref 70–99)
Glucose-Capillary: 273 mg/dL — ABNORMAL HIGH (ref 70–99)

## 2020-01-14 MED ORDER — INSULIN ASPART 100 UNIT/ML ~~LOC~~ SOLN: 0.0000 [IU] | Freq: Three times a day (TID) | SUBCUTANEOUS | 11 refills | Status: DC

## 2020-01-14 MED ORDER — TRAMADOL HCL 50 MG PO TABS: 50.0000 mg | ORAL_TABLET | Freq: Four times a day (QID) | ORAL | 0 refills | Status: DC | PRN

## 2020-01-14 MED ORDER — BISACODYL 5 MG PO TBEC
5.0000 mg | DELAYED_RELEASE_TABLET | Freq: Once | ORAL | Status: AC
  Administered 2020-01-14: 5 mg via ORAL
  Filled 2020-01-14: qty 1

## 2020-01-14 NOTE — Discharge Summary (Signed)
Physician Discharge Summary  Matthew Calderon YBW:[id number] DOB: 1932/12/21 DOA: 01/11/2020  PCP: Merri Brunette, MD  Admit date: 01/11/2020 Discharge date: 01/14/2020  Time spent: 45* minutes  Recommendations for Outpatient Follow-up:  1. Follow-up with Dr. Amanda Pea next week on Wednesday, 01/19/2020 for short arm casting 2. Check BMP on Monday, 01/17/2020  Discharge Diagnoses:  Principal Problem:   Fall at home, initial encounter Active Problems:   Diabetes (HCC)   Prostate hyperplasia, benign localized, without urinary obstruction   Chronic cough   GERD (gastroesophageal reflux disease)   Anemia, unspecified   Enlarged prostate   Gastro-esophageal reflux disease with esophagitis   OSA (obstructive sleep apnea)   Primary open angle glaucoma of right eye, severe stage   Pseudophakia, both eyes   Multiple rib fractures   Dysequilibrium   Head trauma, subsequent encounter   Chronic diastolic CHF (congestive heart failure) (HCC)   Discharge Condition: Stable  Diet recommendation: Heart healthy diet  There were no vitals filed for this visit.  History of present illness:  86 year old male from assisted living facility who normally ambulates with walker sustained a mechanical fall in bathroom on 01/11/2020.  He denies passing out.  Patient was noted to have forehead laceration bruising his head.  Right wrist fracture, CT of chest showed right-sided lateral rib fractures.  He was seen by hand surgery who recommended a splint but no surgical intervention.  Trauma surgery recommended pain management and PT evaluation.  Echocardiogram showed chronic diastolic dysfunction, significant improvement from previous echo which noted impaired ejection fraction.  Patient was seen by PT/OT, recommended skilled nursing facility.   Hospital Course:  1. Mechanical fall/multiple rib fracture-pain control, no evidence of syncope.  Echo stable PT recommends skilled nursing facility.  No surgical  intervention recommended per trauma surgery. 2. Right wrist fracture-seen by orthopedic surgery, conservative management recommended.  Currently immobilized in a sugar tong and stable.  Orthopedics will follow up next week for short arm cast. 3. Diabetes mellitus type 2-continue home medications including Metformin, Synjardy.  Will start sliding scale insulin with NovoLog. 4. Congestive CHF-no evidence of volume overload, EF is preserved.  Continue Lasix 20 mg p.o. daily as needed for fluid overload. Continue Entresto, Coreg. 5. BPH-no urinary obstruction, continue finasteride, tamsulosin. 6. GERD-continue PPI  Procedures:    Consultations:  Orthopedics  Trauma surgery  Discharge Exam: Vitals:   01/14/20 0552 01/14/20 1047  BP: (!) 105/63 112/73  Pulse: 76 82  Resp: 14 15  Temp: (!) 97.3 F (36.3 C) 98.3 F (36.8 C)  SpO2: 93% 96%    General: Appears in no acute distress Cardiovascular: S1-S2, regular, no murmur auscultated Respiratory: Clear to auscultation bilaterally, no wheezing or crackles auscultated  Discharge Instructions   Discharge Instructions    Diet - low sodium heart healthy   Complete by: As directed    Increase activity slowly   Complete by: As directed    No wound care   Complete by: As directed      Allergies as of 01/14/2020      Reactions   Penicillins Hives   DID THE REACTION INVOLVE: Swelling of the face/tongue/throat, SOB, or low BP? No Sudden or severe rash/hives, skin peeling, or the inside of the mouth or nose? No Did it require medical treatment? No When did it last happen?childhood allergy If all above answers are "NO", may proceed with cephalosporin use.   Sulfa Antibiotics Hives      Medication List    STOP taking  these medications   insulin glargine 100 UNIT/ML injection Commonly known as: LANTUS   pantoprazole 40 MG tablet Commonly known as: PROTONIX     TAKE these medications   aspirin 81 MG EC tablet Take 1  tablet (81 mg total) by mouth daily.   atorvastatin 40 MG tablet Commonly known as: LIPITOR Take 1 tablet (40 mg total) by mouth daily at 6 PM. What changed: when to take this   brimonidine 0.2 % ophthalmic solution Commonly known as: ALPHAGAN Place 1 drop into both eyes 2 (two) times daily.   carvedilol 12.5 MG tablet Commonly known as: COREG Take 1 tablet (12.5 mg total) by mouth 2 (two) times daily with a meal.   Cholecalciferol 25 MCG (1000 UT) tablet Take 2,000 Units by mouth every morning.   Ferrous Sulfate 143 (45 Fe) MG Tbcr Take 1 tablet by mouth daily.   finasteride 5 MG tablet Commonly known as: PROSCAR Take 5 mg by mouth every evening.   furosemide 20 MG tablet Commonly known as: LASIX Take 20 mg by mouth daily as needed for fluid or edema. What changed: Another medication with the same name was removed. Continue taking this medication, and follow the directions you see here.   insulin aspart 100 UNIT/ML injection Commonly known as: novoLOG Inject 0-9 Units into the skin 3 (three) times daily with meals. Sliding scale insulin Less than 70 initiate hypoglycemia protocol 70-120  0 units 120-150 1 unit 151-200 2 units 201-250 3 units 251-300 5 units 301-350 7 units 351-400 9 units  Greater than 400 call MD What changed:   how much to take  additional instructions   Melatonin 10 MG Tbcr Take 1 tablet by mouth at bedtime.   metFORMIN 500 MG tablet Commonly known as: GLUCOPHAGE Take 500 mg by mouth every evening.   Myrbetriq 50 MG Tb24 tablet Generic drug: mirabegron ER Take 50 mg by mouth daily.   potassium chloride 10 MEQ tablet Commonly known as: KLOR-CON Take 1 tablet (10 mEq total) by mouth daily.   PreserVision AREDS 2 Caps Take 1 capsule by mouth 2 (two) times daily.   sacubitril-valsartan 24-26 MG Commonly known as: ENTRESTO Take 1 tablet by mouth 2 (two) times daily.   sertraline 50 MG tablet Commonly known as: ZOLOFT Take 50 mg  by mouth every evening.   Synjardy XR 25-1000 MG Tb24 Generic drug: Empagliflozin-metFORMIN HCl ER Take 1 tablet by mouth daily.   tamsulosin 0.4 MG Caps capsule Commonly known as: FLOMAX Take 0.4 mg by mouth every evening.   timolol 0.5 % ophthalmic solution Commonly known as: TIMOPTIC Place 1 drop into both eyes 2 (two) times daily.   traMADol 50 MG tablet Commonly known as: ULTRAM Take 1 tablet (50 mg total) by mouth every 6 (six) hours as needed for moderate pain.   traZODone 50 MG tablet Commonly known as: DESYREL Take 50 mg by mouth at bedtime.      Allergies  Allergen Reactions  . Penicillins Hives    DID THE REACTION INVOLVE: Swelling of the face/tongue/throat, SOB, or low BP? No Sudden or severe rash/hives, skin peeling, or the inside of the mouth or nose? No Did it require medical treatment? No When did it last happen?childhood allergy If all above answers are "NO", may proceed with cephalosporin use.   Gaetana Michaelis Antibiotics Hives    Follow-up Information    Sur[facility], [facility]. Call.   Specialty: General Surgery Why: If sutures from right face have not been  removed in 5-7 days please call to schedule a nurse visit for suture removal.  Contact information: [address] [place] [address] [phone]        Dominica Severin, MD. Schedule an appointment as soon as possible for a visit in 1 week(s).   Specialty: Orthopedic Surgery Contact information: [address] [place] [address] [place] [address] [phone]        Merri Brunette, MD Follow up.   Specialty: Internal Medicine Contact information: [address] [place] [address] [place] [address] [phone]                The results of significant diagnostics from this hospitalization (including imaging, microbiology, ancillary and laboratory) are listed below for reference.    Significant Diagnostic Studies: DG Chest 2 View  Result Date:  01/11/2020 CLINICAL DATA:  Weakness. Status post fall in the bathroom. EXAM: CHEST - 2 VIEW COMPARISON:  CT of the chest June 20, 2018 FINDINGS: Cardiomediastinal silhouette is normal. Mediastinal contours appear intact. Airspace consolidation versus atelectasis in the left lung base. Osseous structures are without acute abnormality. Soft tissues are grossly normal. IMPRESSION: Airspace consolidation versus atelectasis in the left lung base. Electronically Signed   By: Ted Mcalpine M.D.   On: 01/11/2020 12:04   DG Wrist Complete Right  Result Date: 01/11/2020 CLINICAL DATA:  Fall, right wrist pain EXAM: RIGHT WRIST - COMPLETE 3+ VIEW COMPARISON:  None. FINDINGS: Four view radiograph right wrist demonstrates a comminuted fracture of the distal right radius with impaction of the distal radial articular surface with fracture planes identified involving the radial, volar aspect of the radial styloid as well as the probable ulnar aspect of the distal radius involving the lunate fossa. Radiocarpal articulation is preserved. There is preserved mild volar inclination of the distal radial articular Osseous structures are mildly osteopenic. Erosions are seen within the distal scaphoid and lunate which are likely degenerative in nature. Degenerative chondrocalcinosis is seen involving the TFCC. There is moderate degenerate arthritis involving the triscaphe joint. IMPRESSION: Impacted intra-articular fracture of the distal radius with minimal impaction of the distal radial articular surface. Fracture fragments appear anatomically aligned. Electronically Signed   By: Helyn Numbers MD   On: 01/11/2020 15:16   CT Head Wo Contrast  Result Date: 01/11/2020 CLINICAL DATA:  Larey Seat today, bleeding from top of head, neck pain, no loss of consciousness, history diabetes mellitus EXAM: CT HEAD WITHOUT CONTRAST CT CERVICAL SPINE WITHOUT CONTRAST TECHNIQUE: Multidetector CT imaging of the head and cervical spine was  performed following the standard protocol without intravenous contrast. Multiplanar CT image reconstructions of the cervical spine were also generated. COMPARISON:  06/20/2018 FINDINGS: CT HEAD FINDINGS Brain: Generalized atrophy. Normal ventricular morphology. No midline shift or mass effect. Mild small vessel chronic ischemic changes of deep cerebral white matter. Small old lacunar infarct at LEFT thalamus. No intracranial hemorrhage, mass lesion or evidence of acute infarction. No extra-axial fluid collections. Vascular: Atherosclerotic calcification of internal carotid arteries at skull base Skull: Small RIGHT frontal scalp hematoma.  Calvaria intact. Sinuses/Orbits: Clear Other: N/A CT CERVICAL SPINE FINDINGS Alignment: Minimal anterolisthesis at C6-C7 unchanged. Remaining alignments normal Skull base and vertebrae: Osseous demineralization. Multilevel facet degenerative changes cervical spine. Disc space narrowing and endplate spur formation at C7-T1 and T1-T2 as well as C6-C7. Vertebral body heights maintained. Visualized skull base intact. Incomplete posterior arch C1, developmental anomaly. No fracture, additional subluxation or bone destruction. Soft tissues and spinal canal: Prevertebral soft tissues normal thickness.  Atherosclerotic calcifications at carotid bifurcations. 1.6 cm LEFT thyroid nodule at unchanged; recommend thyroid ultrasound if clinically indicated based on patient age and comorbidities. (Ref: J Am Coll Radiol. 2015 Feb;12(2): 143-50). Disc levels:  No additional abnormalities Upper chest: Lung apices clear Other: N/A IMPRESSION: Atrophy with small vessel chronic ischemic changes of deep cerebral white matter. 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 No acute intracranial abnormalities. Multilevel degenerative disc and facet disease changes of the cervical spine. No acute cervical spine abnormalities. Unchanged 1.6 cm diameter LEFT thyroid nodule; recommend thyroid ultrasound if clinically  indicated based on patient age and comorbidities as above. Electronically Signed   By: Ulyses Southward M.D.   On: 01/11/2020 12:20   CT Chest W Contrast  Result Date: 01/11/2020 CLINICAL DATA:  Ground level fall EXAM: CT CHEST WITH CONTRAST TECHNIQUE: Multidetector CT imaging of the chest was performed during intravenous contrast administration. CONTRAST:  75mL OMNIPAQUE IOHEXOL 300 MG/ML  SOLN COMPARISON:  Radiograph 01/11/2020, CTA 06/20/2018 FINDINGS: Cardiovascular: The aortic root is suboptimally assessed given cardiac pulsation artifact. Atherosclerotic plaque within the normal caliber aorta. No acute luminal abnormality nor periaortic stranding or hemorrhage. Minimal atheromatous plaque in the otherwise unremarkable great vessels. Left dominant vertebral arteries. Central pulmonary arteries are normal caliber. No large central or lobar filling defects on this limited, non tailored evaluation of the pulmonary artery vasculature. Normal heart size. No pericardial effusion. Calcifications upon the aortic leaflets and mitral annulus as well as three-vessel coronary artery calcification. No major venous abnormality. Mediastinum/Nodes: No mediastinal fluid or gas. No acute abnormality of the trachea. Small hiatal hernia without acute abnormality of the esophagus. No worrisome mediastinal, hilar or axillary adenopathy. Diminishing size of a now 10 mm (previously 12 mm), hypoattenuating Left thyroid normal trouble with punctate calcification in the left lobe thyroid gland, possible colloid cyst, no further imaging is warranted in a patient of this age. This follows consensus guidelines: Managing Incidental Thyroid Nodules Detected on Imaging: White Paper of the ACR Incidental Thyroid Findings Committee. J Am Coll Radiol 2015; 12:143-150. and [facility] 3-tiered system for managing ITNs: J Am Coll Radiol. 2015; Feb;12(2): 143-50 Lungs/Pleura: No pneumothorax, effusion or traumatic abnormality of the lung parenchyma. No  consolidation or edema. Dependent atelectasis posteriorly. No suspicious pulmonary nodules or masses. Upper Abdomen: Small hiatal hernia, as above. Partial fatty replacement of the pancreas. Calcified gallstone towards the neck of the otherwise unremarkable gallbladder. Small fluid attenuation cyst in the posterior left lobe liver. Accessory splenule. No acute or worrisome upper abdominal findings. Musculoskeletal: No large body wall hematoma. Acute right fifth through ninth lateral rib fractures. Remote deformity of the left fifth rib. No acute vertebral body fracture or height loss. Anterior wedging of the T11 and T12 vertebral body similar to comparison. Dextrocurvature of the thoracolumbar spine with an apex at the T11 level. IMPRESSION: 1. Acute right fifth through ninth lateral rib fractures. No pneumothorax, effusion, or traumatic abnormality of the lung parenchyma. 2. No other acute traumatic injury identified in the chest. Mild atelectatic change, possibly related to splinting. 3. Remote deformity of the left fifth rib. 4. Cholelithiasis. 5. Aortic Atherosclerosis (ICD10-I70.0). 6. Coronary artery calcifications are present. Please note that the presence of coronary artery calcium documents the presence of coronary artery disease, the severity of this disease and any potential stenosis cannot be assessed on this non-gated CT examination. Electronically Signed   By: Kreg Shropshire M.D.   On: 01/11/2020 15:57   CT Cervical Spine Wo Contrast  Result Date: 01/11/2020  CLINICAL DATA:  Larey SeatFell today, bleeding from top of head, neck pain, no loss of consciousness, history diabetes mellitus EXAM: CT HEAD WITHOUT CONTRAST CT CERVICAL SPINE WITHOUT CONTRAST TECHNIQUE: Multidetector CT imaging of the head and cervical spine was performed following the standard protocol without intravenous contrast. Multiplanar CT image reconstructions of the cervical spine were also generated. COMPARISON:  06/20/2018 FINDINGS: CT HEAD  FINDINGS Brain: Generalized atrophy. Normal ventricular morphology. No midline shift or mass effect. Mild small vessel chronic ischemic changes of deep cerebral white matter. Small old lacunar infarct at LEFT thalamus. No intracranial hemorrhage, mass lesion or evidence of acute infarction. No extra-axial fluid collections. Vascular: Atherosclerotic calcification of internal carotid arteries at skull base Skull: Small RIGHT frontal scalp hematoma.  Calvaria intact. Sinuses/Orbits: Clear Other: N/A CT CERVICAL SPINE FINDINGS Alignment: Minimal anterolisthesis at C6-C7 unchanged. Remaining alignments normal Skull base and vertebrae: Osseous demineralization. Multilevel facet degenerative changes cervical spine. Disc space narrowing and endplate spur formation at C7-T1 and T1-T2 as well as C6-C7. Vertebral body heights maintained. Visualized skull base intact. Incomplete posterior arch C1, developmental anomaly. No fracture, additional subluxation or bone destruction. Soft tissues and spinal canal: Prevertebral soft tissues normal thickness. Atherosclerotic calcifications at carotid bifurcations. 1.6 cm LEFT thyroid nodule at unchanged; recommend thyroid ultrasound if clinically indicated based on patient age and comorbidities. (Ref: J Am Coll Radiol. 2015 Feb;12(2): 143-50). Disc levels:  No additional abnormalities Upper chest: Lung apices clear Other: N/A IMPRESSION: Atrophy with small vessel chronic ischemic changes of deep cerebral white matter. 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 No acute intracranial abnormalities. Multilevel degenerative disc and facet disease changes of the cervical spine. No acute cervical spine abnormalities. Unchanged 1.6 cm diameter LEFT thyroid nodule; recommend thyroid ultrasound if clinically indicated based on patient age and comorbidities as above. Electronically Signed   By: Ulyses SouthwardMark  Boles M.D.   On: 01/11/2020 12:20   DG CHEST PORT 1 VIEW  Result Date: 01/12/2020 CLINICAL DATA:   Multiple right rib fractures. EXAM: PORTABLE CHEST 1 VIEW COMPARISON:  January 11, 2020. FINDINGS: The heart size and mediastinal contours are within normal limits. Both lungs are clear. No pneumothorax or pleural effusion is noted. Minimally displaced right fourth rib fracture is noted. IMPRESSION: Minimally displaced right fourth rib fracture. No acute cardiopulmonary abnormality seen. Electronically Signed   By: Lupita RaiderJames  Green Jr M.D.   On: 01/12/2020 08:34   ECHOCARDIOGRAM COMPLETE  Result Date: 01/12/2020    ECHOCARDIOGRAM REPORT   Patient Name:   Matthew CairoDWIGHT B Kumpf Date of Exam: 01/12/2020 Medical Rec #:  [id number]      Height:       60.0 in Accession #:    [id number]     Weight:       158.6 lb Date of Birth:  09-10-1932      BSA:          1.691 m Patient Age:    87 years       BP:           123/67 mmHg Patient Gender: M              HR:           87 bpm. Exam Location:  Inpatient Procedure: 2D Echo Indications:    CHF-Acute Systolic I50.21  History:        Patient has prior history of Echocardiogram examinations, most                 recent 06/21/2018.  Previous Myocardial Infarction; Risk                 Factors:Diabetes.  Sonographer:    Thurman Coyer RDCS (AE) Referring Phys: [id number] AVA SWAYZE IMPRESSIONS  1. Left ventricular ejection fraction, by estimation, is 55 to 60%. The left ventricle has normal function. Left ventricular endocardial border not optimally defined to evaluate regional wall motion. Left ventricular diastolic parameters are consistent with Grade I diastolic dysfunction (impaired relaxation).  2. Right ventricular systolic function is normal. The right ventricular size is normal. Tricuspid regurgitation signal is inadequate for assessing PA pressure.  3. The mitral valve is normal in structure. No evidence of mitral valve regurgitation. No evidence of mitral stenosis.  4. The aortic valve is tricuspid. Aortic valve regurgitation is trivial. Mild to moderate aortic valve  sclerosis/calcification is present, without any evidence of aortic stenosis.  5. Aortic dilatation noted. There is mild dilatation of the aortic root measuring 37 mm.  6. The inferior vena cava is normal in size with greater than 50% respiratory variability, suggesting right atrial pressure of 3 mmHg.  7. Technically difficult study with poor acoustic windows. Overall LV EF is normal but difficult to comment on motion of individual wall segments. FINDINGS  Left Ventricle: Left ventricular ejection fraction, by estimation, is 55 to 60%. The left ventricle has normal function. Left ventricular endocardial border not optimally defined to evaluate regional wall motion. The left ventricular internal cavity size was normal in size. There is no left ventricular hypertrophy. Left ventricular diastolic parameters are consistent with Grade I diastolic dysfunction (impaired relaxation). Right Ventricle: The right ventricular size is normal. No increase in right ventricular wall thickness. Right ventricular systolic function is normal. Tricuspid regurgitation signal is inadequate for assessing PA pressure. Left Atrium: Left atrial size was normal in size. Right Atrium: Right atrial size was normal in size. Pericardium: There is no evidence of pericardial effusion. Mitral Valve: The mitral valve is normal in structure. There is mild calcification of the mitral valve leaflet(s). Mild mitral annular calcification. No evidence of mitral valve regurgitation. No evidence of mitral valve stenosis. Tricuspid Valve: The tricuspid valve is normal in structure. Tricuspid valve regurgitation is not demonstrated. Aortic Valve: The aortic valve is tricuspid. Aortic valve regurgitation is trivial. Aortic regurgitation PHT measures 434 msec. Mild to moderate aortic valve sclerosis/calcification is present, without any evidence of aortic stenosis. Pulmonic Valve: The pulmonic valve was normal in structure. Pulmonic valve regurgitation is not  visualized. Aorta: Aortic dilatation noted. There is mild dilatation of the aortic root measuring 37 mm. Venous: The inferior vena cava is normal in size with greater than 50% respiratory variability, suggesting right atrial pressure of 3 mmHg. IAS/Shunts: No atrial level shunt detected by color flow Doppler.  LEFT VENTRICLE PLAX 2D LVIDd:         4.40 cm  Diastology LVIDs:         3.00 cm  LV e' lateral:   7.72 cm/s LV PW:         0.90 cm  LV E/e' lateral: 9.8 LV IVS:        1.00 cm  LV e' medial:    3.59 cm/s LVOT diam:     2.40 cm  LV E/e' medial:  21.0 LV SV:         79 LV SV Index:   47 LVOT Area:     4.52 cm  RIGHT VENTRICLE RV S prime:     17.20 cm/s TAPSE (M-mode):  1.8 cm LEFT ATRIUM             Index       RIGHT ATRIUM          Index LA diam:        3.30 cm 1.95 cm/m  RA Area:     8.42 cm LA Vol (A2C):   21.4 ml 12.65 ml/m RA Volume:   14.60 ml 8.63 ml/m LA Vol (A4C):   22.9 ml 13.54 ml/m LA Biplane Vol: 24.3 ml 14.37 ml/m  AORTIC VALVE LVOT Vmax:   82.50 cm/s LVOT Vmean:  55.500 cm/s LVOT VTI:    0.175 m AI PHT:      434 msec  AORTA Ao Root diam: 3.70 cm Ao Asc diam:  3.30 cm MITRAL VALVE MV Area (PHT): 2.91 cm     SHUNTS MV Decel Time: 261 msec     Systemic VTI:  0.18 m MV E velocity: 75.50 cm/s   Systemic Diam: 2.40 cm MV A velocity: 101.00 cm/s MV E/A ratio:  0.75 Marca Ancona MD Electronically signed by Marca Ancona MD Signature Date/Time: 01/12/2020/4:00:07 PM    Final     Microbiology: Recent Results (from the past 240 hour(s))  SARS Coronavirus 2 by RT PCR (hospital order, performed in [facility] hospital lab) Nasopharyngeal Nasopharyngeal Swab     Status: None   Collection Time: 01/11/20  6:26 PM   Specimen: Nasopharyngeal Swab  Result Value Ref Range Status   SARS Coronavirus 2 NEGATIVE NEGATIVE Final    Comment: (NOTE) SARS-CoV-2 target nucleic acids are NOT DETECTED.  The SARS-CoV-2 RNA is generally detectable in upper and lower respiratory specimens during the acute  phase of infection. The lowest concentration of SARS-CoV-2 viral copies this assay can detect is 250 copies / mL. A negative result does not preclude SARS-CoV-2 infection and should not be used as the sole basis for treatment or other patient management decisions.  A negative result may occur with improper specimen collection / handling, submission of specimen other than nasopharyngeal swab, presence of viral mutation(s) within the areas targeted by this assay, and inadequate number of viral copies (<250 copies / mL). A negative result must be combined with clinical observations, patient history, and epidemiological information.  Fact Sheet for Patients:   BoilerBrush.com.cy  Fact Sheet for Healthcare Providers: https://pope.com/  This test is not yet approved or  cleared by the [place] FDA and has been authorized for detection and/or diagnosis of SARS-CoV-2 by FDA under an Emergency Use Authorization (EUA).  This EUA will remain in effect (meaning this test can be used) for the duration of the COVID-19 declaration under Section 564(b)(1) of the Act, 21 U.S.C. section 360bbb-3(b)(1), unless the authorization is terminated or revoked sooner.  Performed at [facility], [address], [place], [place] [address]   SARS CORONAVIRUS 2 (TAT 6-24 HRS) Nasopharyngeal Nasopharyngeal Swab     Status: None   Collection Time: 01/13/20  9:34 AM   Specimen: Nasopharyngeal Swab  Result Value Ref Range Status   SARS Coronavirus 2 NEGATIVE NEGATIVE Final    Comment: (NOTE) SARS-CoV-2 target nucleic acids are NOT DETECTED.  The SARS-CoV-2 RNA is generally detectable in upper and lower respiratory specimens during the acute phase of infection. Negative results do not preclude SARS-CoV-2 infection, do not rule out co-infections with other pathogens, and should not be used as the sole basis for treatment or other patient  management decisions. Negative results must be combined with clinical  observations, patient history, and epidemiological information. The expected result is Negative.  Fact Sheet for Patients: HairSlick.no  Fact Sheet for Healthcare Providers: quierodirigir.com  This test is not yet approved or cleared by the [place] FDA and  has been authorized for detection and/or diagnosis of SARS-CoV-2 by FDA under an Emergency Use Authorization (EUA). This EUA will remain  in effect (meaning this test can be used) for the duration of the COVID-19 declaration under Se ction 564(b)(1) of the Act, 21 U.S.C. section 360bbb-3(b)(1), unless the authorization is terminated or revoked sooner.  Performed at [facility], [address], [place], [place] [address]      Labs: Basic Metabolic Panel: Recent Labs  Lab 01/11/20 1320 01/12/20 0843  NA 142 141  K 4.2 3.7  CL 108 107  CO2 21* 17*  GLUCOSE 137* 174*  BUN 23 18  CREATININE 0.91 1.05  CALCIUM 8.4* 8.7*   CBC: Recent Labs  Lab 01/11/20 1320 01/12/20 0843  WBC 8.9 10.7*  NEUTROABS 6.7  --   HGB 13.9 13.4  HCT 42.6 41.3  MCV 90.1 87.7  PLT 168 173    CBG: Recent Labs  Lab 01/13/20 0817 01/13/20 1142 01/13/20 1650 01/13/20 2116 01/14/20 0754  GLUCAP 149* 241* 236* 151* 153*       Signed:  Meredeth Ide MD.  [facility] 01/14/2020, 11:43 AM

## 2020-01-14 NOTE — Care Management CC44 (Signed)
Condition Code 44 Documentation Completed  Patient Details  Name: Matthew Calderon MRN: [id number] Date of Birth: 11/18/1932   Condition Code 44 given:  Yes Patient signature on Condition Code 44 notice:  Yes Documentation of 2 MD's agreement:  Yes Code 44 added to claim:  Yes    Kingsley Plan, RN 01/14/2020, 12:44 PM

## 2020-01-14 NOTE — TOC Transition Note (Addendum)
Transition of Care [facility]) - CM/SW Discharge Note   Patient Details  Name: Matthew Calderon MRN: [id number] Date of Birth: 02-27-1933  [facility] of Care [facility]) CM/SW Contact:  Doy Hutching, LCSW Phone Number: 01/14/2020, 10:31 AM   Clinical Narrative:    12:19am- CSW updated pt daughter that all paperwork done, await PT to work with him and then [facility] will pick pt up at 2pm or so. Daughter aware and did express pt having some confusion this morning (thought he was already at [facility]). CSW placed PT[facility] papers on chart.  10:31am- CSW spoke with [facility], they are ready for pt to return today, pt COVID swab has resulted negative. Auth received from [facility]) for pt to d/c, MD states pt is stable.   Berkley Harvey #[id number] Ref #[id number] 7/22-7/26  Await d/c summary, orders and any controlled medications scripts printed and signed.   Final next level of care: Skilled Nursing Facility Barriers to Discharge: Barriers Resolved   Patient Goals and CMS Choice Patient states their goals for this hospitalization and ongoing recovery are:: get support while he heals CMS Medicare.gov Compare Post Acute Care list provided to:: Patient (pt is resident at [facility]) Choice offered to / list presented to : Patient  Discharge Placement Existing PASRR number confirmed : 01/12/20          Patient chooses bed at: [facility] Patient to be transferred to facility by: [facility] Name of family member notified: pt daughter Matthew Calderon via telephone Patient and family notified of of transfer: 01/14/20  Discharge Plan and Services In-house Referral: Clinical Social Work Discharge Planning Services: CM Consult Post Acute Care Choice: Skilled Nursing Facility, Durable Medical Equipment           Readmission Risk Interventions Readmission Risk Prevention Plan 01/12/2020  Post Dischage Appt Not Complete  Appt Comments plan for SNF  Medication Screening Complete  Transportation Screening  Complete  Some recent data might be hidden

## 2020-01-14 NOTE — Care Management Obs Status (Signed)
MEDICARE OBSERVATION STATUS NOTIFICATION   Patient Details  Name: Matthew Calderon MRN: [id number] Date of Birth: 1932-08-05   Medicare Observation Status Notification Given:  Yes    Kingsley Plan, RN 01/14/2020, 12:44 PM

## 2020-01-14 NOTE — Progress Notes (Signed)
Orthopedic Tech Progress Note Patient Details:  DRAYCEN LEICHTER 07-10-1932 [id number]  Casting Type of Cast: Short arm cast Cast Location: Upper Right Extremity Cast Material: Fiberglass Cast Intervention: Application  Post Interventions Patient Tolerated: Well Instructions Provided: Adjustment of device, Care of device, Poper ambulation with device  Cast applied with the assistance of Dr.Grammig.  Gerald Stabs 01/14/2020, 3:03 PM

## 2020-01-14 NOTE — Social Work (Signed)
Clinical Social Worker facilitated patient discharge including contacting patient family and facility to confirm patient discharge plans.  Clinical information faxed to facility and family agreeable with plan.  CSW arranged ambulance transport via [facility] to [facility] RN to call [phone]  with report prior to discharge.  Clinical Social Worker will sign off for now as social work intervention is no longer needed. Please consult us again if new need arises.  Bertina Guthridge, MSW, LCSW Clinical Social Worker   

## 2020-01-14 NOTE — Progress Notes (Signed)
Physical Therapy Treatment Patient Details Name: Matthew Calderon MRN: [id number] DOB: [birth year] Today's Date: 01/14/2020    History of Present Illness Pt is 87yo male living at [facility], typically amb with RW but fell face first in bathroom when attempting to shave, + LOC, sustained R 5-9 rib fractures, R wrist fracture, lac to face PMH: anxiety, depression, DMII GERD, CHF, OSA, glaucoma, CAD, and anemia.    PT Comments    Pt tolerated standing EOB and lateral stepping only this day, very limited by R rib pain. Pt required mod-max assist overall for mobility, pt noting bed mobility being particularly painful. PT encouraged pt to use incentive spirometer upon PT exit. Matthew Calderon/c plan remains appropriate.    Follow Up Recommendations  SNF;Supervision/Assistance - 24 hour     Equipment Recommendations  None recommended by PT    Recommendations for Other Services       Precautions / Restrictions Precautions Precautions: Fall Precaution Comments: R UE in sling Required Braces or Orthoses: Sling;Splint/Cast Splint/Cast: R splint with ace wrap and in sling Splint/Cast - Date Prophylactic Dressing Applied (if applicable): 01/11/20 Restrictions Weight Bearing Restrictions: Yes RUE Weight Bearing: Non weight bearing    Mobility  Bed Mobility Overal bed mobility: Needs Assistance Bed Mobility: Rolling;Sidelying to Sit;Sit to Supine Rolling: Min assist Sidelying to sit: Mod assist;HOB elevated   Sit to supine: Max assist;HOB elevated   General bed mobility comments: Min assist for trunk and LE translation to L sidelying, mod assist for trunk elevation and LE lowering off EOB to get to sitting. max assist for return to supine for LE lifting and placement in bed, boost up in bed with use of bed pads.  Transfers Overall transfer level: Needs assistance Equipment used: 1 person hand held assist Transfers: Sit to/from Stand           General transfer comment: Mod assist for power  up, steadying upon standing. STS x2 from EOB, pt sat down after ~20 seconds after first stand due to pain. Pt able to take lateral steps towards [facility] only this day.  Ambulation/Gait             General Gait Details: unable this day, latearl steps to [facility] only   Stairs             Wheelchair Mobility    Modified Rankin (Stroke Patients Only)       Balance Overall balance assessment: Needs assistance Sitting-balance support: Feet supported;Single extremity supported Sitting balance-Leahy Scale: Fair     Standing balance support: Single extremity supported Standing balance-Leahy Scale: Poor Standing balance comment: reliant on external support                            Cognition Arousal/Alertness: Awake/alert Behavior During Therapy: WFL for tasks assessed/performed Overall Cognitive Status: Within Functional Limits for tasks assessed                                        Exercises Other Exercises Other Exercises: encouraged incentive spirometry to prevent PNA    General Comments General comments (skin integrity, edema, etc.): R facial and head lacerations      Pertinent Vitals/Pain Pain Assessment: Faces Faces Pain Scale: Hurts whole lot Pain Location: R chest wall due to rib fractures Pain Descriptors / Indicators: Grimacing;Guarding;Discomfort;Sore Pain Intervention(s): Limited activity within patient's tolerance;Monitored during  session;Premedicated before session;Repositioned    Home Living                      Prior Function            PT Goals (current goals can now be found in the care plan section) Acute Rehab PT Goals Patient Stated Goal: get better PT Goal Formulation: With patient Time For Goal Achievement: 01/26/20 Potential to Achieve Goals: Good Progress towards PT goals: Progressing toward goals    Frequency    Min 2X/week      PT Plan Current plan remains appropriate     Co-evaluation              AM-PAC PT "6 Clicks" Mobility   Outcome Measure  Help needed turning from your back to your side while in a flat bed without using bedrails?: A Lot Help needed moving from lying on your back to sitting on the side of a flat bed without using bedrails?: A Lot Help needed moving to and from a bed to a chair (including a wheelchair)?: A Lot Help needed standing up from a chair using your arms (e.g., wheelchair or bedside chair)?: A Lot Help needed to walk in hospital room?: A Lot Help needed climbing 3-5 steps with a railing? : Total 6 Click Score: 11    End of Session Equipment Utilized During Treatment: Other (comment) (RUE sling) Activity Tolerance: Patient tolerated treatment well Patient left: with call bell/phone within reach;with bed alarm set;in bed Nurse Communication: Mobility status PT Visit Diagnosis: Unsteadiness on feet (R26.81);Muscle weakness (generalized) (M62.81);Difficulty in walking, not elsewhere classified (R26.2)     Time: 1240-1300 PT Time Calculation (min) (ACUTE ONLY): 20 min  Charges:  $Therapeutic Activity: 8-22 mins                     Matthew Calderon E, PT Acute Rehabilitation Services Pager [phone]  Office [phone]    Matthew Calderon Matthew Calderon Matthew Calderon 01/14/2020, 1:15 PM

## 2020-01-17 ENCOUNTER — Emergency Department (HOSPITAL_COMMUNITY)
Admission: EM | Admit: 2020-01-17 | Discharge: 2020-01-18 | Disposition: A | Discharge status: Home / Self Care | Payer: Medicare PPO | Attending: Emergency Medicine | Admitting: Emergency Medicine

## 2020-01-17 ENCOUNTER — Other Ambulatory Visit: Payer: Self-pay

## 2020-01-17 DIAGNOSIS — R0902 Hypoxemia: Secondary | ICD-10-CM | POA: Diagnosis not present

## 2020-01-17 DIAGNOSIS — H409 Unspecified glaucoma: Secondary | ICD-10-CM | POA: Diagnosis not present

## 2020-01-17 DIAGNOSIS — S72142A Displaced intertrochanteric fracture of left femur, initial encounter for closed fracture: Secondary | ICD-10-CM | POA: Diagnosis not present

## 2020-01-17 DIAGNOSIS — Z87891 Personal history of nicotine dependence: Secondary | ICD-10-CM | POA: Insufficient documentation

## 2020-01-17 DIAGNOSIS — S51012A Laceration without foreign body of left elbow, initial encounter: Secondary | ICD-10-CM

## 2020-01-17 DIAGNOSIS — M25552 Pain in left hip: Secondary | ICD-10-CM | POA: Diagnosis not present

## 2020-01-17 DIAGNOSIS — Z7982 Long term (current) use of aspirin: Secondary | ICD-10-CM | POA: Insufficient documentation

## 2020-01-17 DIAGNOSIS — S0093XA Contusion of unspecified part of head, initial encounter: Secondary | ICD-10-CM | POA: Diagnosis not present

## 2020-01-17 DIAGNOSIS — N401 Enlarged prostate with lower urinary tract symptoms: Secondary | ICD-10-CM | POA: Diagnosis not present

## 2020-01-17 DIAGNOSIS — M9702XA Periprosthetic fracture around internal prosthetic left hip joint, initial encounter: Secondary | ICD-10-CM | POA: Insufficient documentation

## 2020-01-17 DIAGNOSIS — Y939 Activity, unspecified: Secondary | ICD-10-CM | POA: Diagnosis not present

## 2020-01-17 DIAGNOSIS — W19XXXA Unspecified fall, initial encounter: Secondary | ICD-10-CM | POA: Insufficient documentation

## 2020-01-17 DIAGNOSIS — I5032 Chronic diastolic (congestive) heart failure: Secondary | ICD-10-CM | POA: Insufficient documentation

## 2020-01-17 DIAGNOSIS — S0181XA Laceration without foreign body of other part of head, initial encounter: Secondary | ICD-10-CM | POA: Diagnosis not present

## 2020-01-17 DIAGNOSIS — E119 Type 2 diabetes mellitus without complications: Secondary | ICD-10-CM | POA: Diagnosis not present

## 2020-01-17 DIAGNOSIS — Z96642 Presence of left artificial hip joint: Secondary | ICD-10-CM | POA: Diagnosis not present

## 2020-01-17 DIAGNOSIS — R9431 Abnormal electrocardiogram [ECG] [EKG]: Secondary | ICD-10-CM | POA: Diagnosis not present

## 2020-01-17 DIAGNOSIS — Y999 Unspecified external cause status: Secondary | ICD-10-CM | POA: Diagnosis not present

## 2020-01-17 DIAGNOSIS — S2239XS Fracture of one rib, unspecified side, sequela: Secondary | ICD-10-CM | POA: Diagnosis not present

## 2020-01-17 DIAGNOSIS — Z20822 Contact with and (suspected) exposure to covid-19: Secondary | ICD-10-CM | POA: Insufficient documentation

## 2020-01-17 DIAGNOSIS — R296 Repeated falls: Secondary | ICD-10-CM | POA: Diagnosis not present

## 2020-01-17 DIAGNOSIS — S2241XA Multiple fractures of ribs, right side, initial encounter for closed fracture: Secondary | ICD-10-CM | POA: Diagnosis not present

## 2020-01-17 DIAGNOSIS — R52 Pain, unspecified: Secondary | ICD-10-CM | POA: Diagnosis not present

## 2020-01-17 DIAGNOSIS — R05 Cough: Secondary | ICD-10-CM | POA: Diagnosis not present

## 2020-01-17 DIAGNOSIS — D649 Anemia, unspecified: Secondary | ICD-10-CM | POA: Diagnosis not present

## 2020-01-17 DIAGNOSIS — Z96651 Presence of right artificial knee joint: Secondary | ICD-10-CM | POA: Diagnosis not present

## 2020-01-17 DIAGNOSIS — Z794 Long term (current) use of insulin: Secondary | ICD-10-CM | POA: Diagnosis not present

## 2020-01-17 DIAGNOSIS — Y9289 Other specified places as the place of occurrence of the external cause: Secondary | ICD-10-CM | POA: Diagnosis not present

## 2020-01-17 DIAGNOSIS — Z9889 Other specified postprocedural states: Secondary | ICD-10-CM | POA: Diagnosis not present

## 2020-01-17 DIAGNOSIS — Z96641 Presence of right artificial hip joint: Secondary | ICD-10-CM | POA: Diagnosis not present

## 2020-01-17 DIAGNOSIS — I5040 Unspecified combined systolic (congestive) and diastolic (congestive) heart failure: Secondary | ICD-10-CM | POA: Diagnosis not present

## 2020-01-17 DIAGNOSIS — S59902A Unspecified injury of left elbow, initial encounter: Secondary | ICD-10-CM | POA: Diagnosis not present

## 2020-01-17 DIAGNOSIS — S0990XA Unspecified injury of head, initial encounter: Secondary | ICD-10-CM | POA: Diagnosis present

## 2020-01-17 DIAGNOSIS — S6291XA Unspecified fracture of right wrist and hand, initial encounter for closed fracture: Secondary | ICD-10-CM | POA: Diagnosis not present

## 2020-01-18 ENCOUNTER — Emergency Department (HOSPITAL_COMMUNITY): Payer: Medicare PPO

## 2020-01-18 ENCOUNTER — Other Ambulatory Visit: Payer: Self-pay

## 2020-01-18 DIAGNOSIS — W19XXXA Unspecified fall, initial encounter: Secondary | ICD-10-CM | POA: Diagnosis not present

## 2020-01-18 DIAGNOSIS — M9702XA Periprosthetic fracture around internal prosthetic left hip joint, initial encounter: Secondary | ICD-10-CM | POA: Diagnosis not present

## 2020-01-18 DIAGNOSIS — R410 Disorientation, unspecified: Secondary | ICD-10-CM | POA: Diagnosis not present

## 2020-01-18 DIAGNOSIS — M2550 Pain in unspecified joint: Secondary | ICD-10-CM | POA: Diagnosis not present

## 2020-01-18 DIAGNOSIS — Z7401 Bed confinement status: Secondary | ICD-10-CM | POA: Diagnosis not present

## 2020-01-18 DIAGNOSIS — Z9889 Other specified postprocedural states: Secondary | ICD-10-CM | POA: Diagnosis not present

## 2020-01-18 DIAGNOSIS — S59902A Unspecified injury of left elbow, initial encounter: Secondary | ICD-10-CM | POA: Diagnosis not present

## 2020-01-18 DIAGNOSIS — S51012A Laceration without foreign body of left elbow, initial encounter: Secondary | ICD-10-CM | POA: Diagnosis not present

## 2020-01-18 DIAGNOSIS — R9431 Abnormal electrocardiogram [ECG] [EKG]: Secondary | ICD-10-CM | POA: Diagnosis not present

## 2020-01-18 DIAGNOSIS — S2241XA Multiple fractures of ribs, right side, initial encounter for closed fracture: Secondary | ICD-10-CM | POA: Diagnosis not present

## 2020-01-18 DIAGNOSIS — S72142A Displaced intertrochanteric fracture of left femur, initial encounter for closed fracture: Secondary | ICD-10-CM | POA: Diagnosis not present

## 2020-01-18 LAB — URINALYSIS, ROUTINE W REFLEX MICROSCOPIC
Bacteria, UA: NONE SEEN
Bilirubin Urine: NEGATIVE
Glucose, UA: >=500 mg/dL — AB
Ketones, ur: 20 mg/dL — AB
Leukocytes,Ua: NEGATIVE
Nitrite: NEGATIVE
Protein, ur: NEGATIVE mg/dL
Specific Gravity, Urine: 1.023 (ref 1.005–1.030)
pH: 5 (ref 5.0–8.0)

## 2020-01-18 LAB — CBC WITH DIFFERENTIAL/PLATELET
Abs Immature Granulocytes: 0.06 10*3/uL (ref 0.00–0.07)
Basophils Absolute: 0 10*3/uL (ref 0.0–0.1)
Basophils Relative: 0 %
Eosinophils Absolute: 0.3 10*3/uL (ref 0.0–0.5)
Eosinophils Relative: 4 %
HCT: 40.3 % (ref 39.0–52.0)
Hemoglobin: 13.2 g/dL (ref 13.0–17.0)
Immature Granulocytes: 1 %
Lymphocytes Relative: 8 %
Lymphs Abs: 0.8 10*3/uL (ref 0.7–4.0)
MCH: 29.1 pg (ref 26.0–34.0)
MCHC: 32.8 g/dL (ref 30.0–36.0)
MCV: 89 fL (ref 80.0–100.0)
Monocytes Absolute: 0.8 10*3/uL (ref 0.1–1.0)
Monocytes Relative: 9 %
Neutro Abs: 7 10*3/uL (ref 1.7–7.7)
Neutrophils Relative %: 78 %
Platelets: 229 10*3/uL (ref 150–400)
RBC: 4.53 MIL/uL (ref 4.22–5.81)
RDW: 13.8 % (ref 11.5–15.5)
WBC: 9 10*3/uL (ref 4.0–10.5)
nRBC: 0 % (ref 0.0–0.2)

## 2020-01-18 LAB — COMPREHENSIVE METABOLIC PANEL
ALT: 14 U/L (ref 0–44)
AST: 15 U/L (ref 15–41)
Albumin: 3.5 g/dL (ref 3.5–5.0)
Alkaline Phosphatase: 75 U/L (ref 38–126)
Anion gap: 13 (ref 5–15)
BUN: 28 mg/dL — ABNORMAL HIGH (ref 8–23)
CO2: 26 mmol/L (ref 22–32)
Calcium: 8.8 mg/dL — ABNORMAL LOW (ref 8.9–10.3)
Chloride: 100 mmol/L (ref 98–111)
Creatinine, Ser: 0.94 mg/dL (ref 0.61–1.24)
GFR calc Af Amer: >60 mL/min (ref >60)
GFR calc non Af Amer: >60 mL/min (ref >60)
Glucose, Bld: 185 mg/dL — ABNORMAL HIGH (ref 70–99)
Potassium: 4.6 mmol/L (ref 3.5–5.1)
Sodium: 139 mmol/L (ref 135–145)
Total Bilirubin: 1 mg/dL (ref 0.3–1.2)
Total Protein: 7.1 g/dL (ref 6.5–8.1)

## 2020-01-18 LAB — SAMPLE TO BLOOD BANK

## 2020-01-18 LAB — PROTIME-INR
INR: 1.1 (ref 0.8–1.2)
Prothrombin Time: 14.1 seconds (ref 11.4–15.2)

## 2020-01-18 LAB — SARS CORONAVIRUS 2 BY RT PCR (HOSPITAL ORDER, PERFORMED IN ~~LOC~~ HOSPITAL LAB): SARS Coronavirus 2: NEGATIVE

## 2020-01-18 LAB — APTT: aPTT: 33 seconds (ref 24–36)

## 2020-01-18 MED ORDER — TRAMADOL HCL 50 MG PO TABS: 50.0000 mg | ORAL_TABLET | Freq: Four times a day (QID) | ORAL | 0 refills | Status: DC | PRN

## 2020-01-18 NOTE — Discharge Instructions (Addendum)
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  You can walk on that leg as tolerated.  You could use a wheelchair if needed.  You could use a walker however with the cast on your right hand that will be difficult.  YOU SHOULD ASK FOR HELP BEFORE TRYING TO WALK!!!   Well you there is 2207 she had her FiO2 to detox with her social worker lab was normal repeat [facility] met this morning just to make sure functions are normal.  Home she she thinks she was answering my physical therapy evaluation that [facility] She Has a right she is one of their patients in this she has been here so that a couple hours ago Dr. Rhona Raider, your orthopedist, wants to check you in 1 to 2 weeks.  Please call his office to get an appointment.

## 2020-01-18 NOTE — ED Notes (Signed)
Patient was able to stand with walker in room. Patient denied SOB and dizziness.

## 2020-01-18 NOTE — ED Notes (Signed)
Assumed care of pt at this time. Pt resting in stretcher, no s/sx of acute distress at this time.  

## 2020-01-18 NOTE — ED Triage Notes (Signed)
PER EMS: Pt is coming from [facility] with c/o fall. Pt was attempting to get out of bed when he fell on his bottom. Complaining of left hip pain, 3/10, no obvious deformities. Denies LOC and denies hitting his head. States he had a previous fall a week ago. Old bruising on eye, broken wrist and ribs from previous fall. Pt is A&Ox4 and bed bound.  EMS VITALS: BP 126/72 HR 80 RR 16 SPO2 94% RA

## 2020-01-18 NOTE — ED Notes (Signed)
Patient was able to ambulate for about 4 feet using a walker but was not steady and was at a high risk of falling.  Patient was able to get out of and back into bed with double assist from two techs.

## 2020-01-18 NOTE — ED Provider Notes (Signed)
[facility]-EMERGENCY DEPT Provider Note   CSN: [id number] Arrival date & time: 01/17/20  2357   Time seen 12:18 AM  History No chief complaint on file.   Matthew Calderon is a 86 y.o. male.  HPI   Patient states he thought maybe he was dreaming that he went out to the garage at his facility and was going to drive to see his old home however he states he fell in the garage.  He states he fell out of his wheelchair.  He complains of pain in his left lateral hip, his left elbow, and he has continued unchanged pain from his right rib fractures he suffered about a week ago.  He states he denies shortness of breath but he does have pleuritic pain on the right.  He has a mild cough with white sputum production but no fever.  He denies hitting his head or neck pain.  Patient was seen for a fall on July 20 and had a right wrist fracture, 5 broken ribs on the right and a contusion around his right eye.  Patient states he has had the Pfizer Covid vaccines.  PCP Merri BrunettePharr, Walter, MD   Past Medical History:  Diagnosis Date  . Anemia    takes iron pill  . Anxiety   . Arthritis   . Depression   . Diabetes mellitus without complication (HCC)   . GERD (gastroesophageal reflux disease)    pepto  . H/O hiatal hernia   . Pneumonia    4 years ago  . PONV (postoperative nausea and vomiting) 05/14/2019  . Post-nasal drip   . Prostate hyperplasia, benign localized, without urinary obstruction   . Seasonal allergies   . Sleep apnea    no CPAP    Patient Active Problem List   Diagnosis Date Noted  . Fall 01/14/2020  . Chronic diastolic CHF (congestive heart failure) (HCC) 01/12/2020  . Multiple rib fractures 01/11/2020  . Dysequilibrium 01/11/2020  . Fall at home, initial encounter 01/11/2020  . Head trauma, subsequent encounter 01/11/2020  . Arthritis 05/14/2019  . Enlarged prostate 05/14/2019  . History of tobacco abuse 05/14/2019  . Interstitial cystitis 05/14/2019  .  Obesity 05/14/2019  . OSA (obstructive sleep apnea) 05/14/2019  . PONV (postoperative nausea and vomiting) 05/14/2019  . Post-nasal drip 05/14/2019  . Seasonal allergies 05/14/2019  . Chronic cough 07/21/2018  . GERD (gastroesophageal reflux disease) 07/21/2018  . NSTEMI (non-ST elevated myocardial infarction) (HCC) 06/20/2018  . Prostate hyperplasia, benign localized, without urinary obstruction   . Hyperglycemia   . Syncope, cardiogenic   . Degenerative joint disease (DJD) of hip 01/13/2014  . Diabetes (HCC) 01/13/2014  . Pseudophakia, both eyes 12/01/2013  . Nonexudative age-related macular degeneration 01/16/2013  . Primary open angle glaucoma of right eye, severe stage 01/13/2013  . Allergic rhinitis 09/17/2010  . Anemia, unspecified 09/17/2010  . Gastro-esophageal reflux disease with esophagitis 12/27/2009  . Other and unspecified hyperlipidemia 12/27/2009  . Hypertrophy of prostate without urinary obstruction and other lower urinary tract symptoms (LUTS) 07/11/2009  . Insomnia 07/11/2009  . Osteoporosis 07/11/2009  . Other malaise and fatigue 06/15/2008  . Depressive disorder, not elsewhere classified 05/04/2008  . Unspecified glaucoma 05/04/2008    Past Surgical History:  Procedure Laterality Date  . BACK SURGERY  1995   discectomy  . COLONOSCOPY W/ BIOPSIES AND POLYPECTOMY    . EYE SURGERY Bilateral    cataracts  . EYE SURGERY Bilateral    glaucoma shunts  .  JOINT REPLACEMENT Left 1992   knee  . JOINT REPLACEMENT Right 1996   knee  . JOINT REPLACEMENT Right 2009   hip  . JOINT REPLACEMENT Left 2005   hip  . KNEE ARTHROSCOPY Left 1981  . KNEE ARTHROSCOPY WITH PATELLA RECONSTRUCTION Right 2010  . KNEE SURGERY Bilateral 1954   knee surgery and placed in a cast for 6 weeks  . KNEE SURGERY  1961   cartilage removed  . LEG SURGERY Bilateral 1974, 1975   straighten legs  . SHOULDER OPEN ROTATOR CUFF REPAIR Right 2010  . TONSILLECTOMY    . TOTAL HIP  ARTHROPLASTY Right 01/13/2014   Procedure: TOTAL HIP ARTHROPLASTY ANTERIOR APPROACH;  Surgeon: Velna Ochs, MD;  Location: [facility] OR;  Service: Orthopedics;  Laterality: Right;       Family History  Problem Relation Age of Onset  . Arthritis Mother   . Arthritis Father     Social History   Tobacco Use  . Smoking status: Former Smoker    Packs/day: 1.00    Years: 18.00    Pack years: 18.00    Types: Cigarettes    Quit date: 1978    Years since quitting: 43.5  . Smokeless tobacco: Never Used  . Tobacco comment: stopped 45 years ago  Vaping Use  . Vaping Use: Never used  Substance Use Topics  . Alcohol use: Yes    Comment: social  . Drug use: No  lives in a NH  Home Medications Prior to Admission medications   Medication Sig Start Date End Date Taking? Authorizing Provider  aspirin EC 81 MG EC tablet Take 1 tablet (81 mg total) by mouth daily. 06/27/18  Yes Elgergawy, Leana Roe, MD  atorvastatin (LIPITOR) 40 MG tablet Take 1 tablet (40 mg total) by mouth daily at 6 PM. Patient taking differently: Take 40 mg by mouth every evening.  06/26/18  Yes Elgergawy, Leana Roe, MD  brimonidine (ALPHAGAN) 0.2 % ophthalmic solution Place 1 drop into both eyes 2 (two) times daily. 09/22/17  Yes [provider]  carvedilol (COREG) 12.5 MG tablet Take 1 tablet (12.5 mg total) by mouth 2 (two) times daily with a meal. 06/26/18  Yes Elgergawy, Leana Roe, MD  Cholecalciferol 25 MCG (1000 UT) tablet Take 2,000 Units by mouth every morning.    Yes [provider]  Ferrous Sulfate 143 (45 FE) MG TBCR Take 1 tablet by mouth daily.   Yes [provider]  finasteride (PROSCAR) 5 MG tablet Take 5 mg by mouth every evening.  03/11/19  Yes [provider]  furosemide (LASIX) 20 MG tablet Take 20 mg by mouth daily as needed for fluid or edema.   Yes [provider]  insulin aspart (NOVOLOG) 100 UNIT/ML injection Inject 0-9 Units into the skin 3 (three) times daily with  meals. Sliding scale insulin Less than 70 initiate hypoglycemia protocol 70-120  0 units 120-150 1 unit 151-200 2 units 201-250 3 units 251-300 5 units 301-350 7 units 351-400 9 units  Greater than 400 call MD 01/14/20  Yes Meredeth Ide, MD  Melatonin 10 MG TBCR Take 1 tablet by mouth at bedtime.   Yes [provider]  metFORMIN (GLUCOPHAGE) 500 MG tablet Take 500 mg by mouth every evening.   Yes [provider]  Multiple Vitamins-Minerals (PRESERVISION AREDS 2) CAPS Take 1 capsule by mouth 2 (two) times daily.    Yes [provider]  MYRBETRIQ 50 MG TB24 tablet Take 50 mg  by mouth daily. 03/23/18  Yes [provider]  potassium chloride (K-DUR,KLOR-CON) 10 MEQ tablet Take 1 tablet (10 mEq total) by mouth daily. 06/27/18  Yes Elgergawy, Leana Roe, MD  sacubitril-valsartan (ENTRESTO) 24-26 MG Take 1 tablet by mouth 2 (two) times daily. 06/26/18  Yes Elgergawy, Leana Roe, MD  sertraline (ZOLOFT) 50 MG tablet Take 50 mg by mouth every evening.   Yes [provider]  SYNJARDY XR 25-1000 MG TB24 Take 1 tablet by mouth daily.  04/22/19  Yes [provider]  tamsulosin (FLOMAX) 0.4 MG CAPS capsule Take 0.4 mg by mouth every evening.  03/19/18  Yes [provider]  timolol (TIMOPTIC) 0.5 % ophthalmic solution Place 1 drop into both eyes 2 (two) times daily. 09/22/17  Yes [provider]  traMADol (ULTRAM) 50 MG tablet Take 1 tablet (50 mg total) by mouth every 6 (six) hours as needed for moderate pain. 01/14/20  Yes Meredeth Ide, MD  traZODone (DESYREL) 50 MG tablet Take 50 mg by mouth at bedtime. 03/19/18  Yes [provider]    Allergies    Penicillins and Sulfa antibiotics  Review of Systems   Review of Systems  All other systems reviewed and are negative.   Physical Exam Updated Vital Signs BP 124/78 (BP Location: Right Arm)   Pulse 92   Temp 98.4 F (36.9 C) (Oral)   Resp (!) 24   SpO2 92%   Physical  Exam Vitals and nursing note reviewed.  Constitutional:      Appearance: Normal appearance. He is normal weight.  HENT:     Right Ear: External ear normal.     Left Ear: External ear normal.     Nose: Nose normal.  Eyes:     Extraocular Movements: Extraocular movements intact.     Conjunctiva/sclera: Conjunctivae normal.     Pupils: Pupils are equal, round, and reactive to light.     Comments: Bruising around the right cheek and right forehead  Cardiovascular:     Rate and Rhythm: Normal rate and regular rhythm.     Pulses: Normal pulses.     Heart sounds: Normal heart sounds.  Pulmonary:     Effort: Pulmonary effort is normal. No respiratory distress.     Breath sounds: Normal breath sounds.  Chest:     Chest wall: Tenderness present.  Abdominal:     General: Abdomen is flat. Bowel sounds are normal.     Palpations: Abdomen is soft.     Tenderness: There is no abdominal tenderness.  Musculoskeletal:     Cervical back: No tenderness.     Comments: Patient has large scars on both knees. He states his pain is in the lateral left hip area, but it not tender to palpation. He is able to flex the hip. He has no shortening of the leg or internal or external rotation.    Skin:    General: Skin is warm and dry.     Comments: Patient has a superficial small skin tear of his left posterior elbow on the distal upper arm.  There is no joint effusion noted.  Neurological:     General: No focal deficit present.     Mental Status: He is alert and oriented to person, place, and time.     Cranial Nerves: No cranial nerve deficit.  Psychiatric:        Mood and Affect: Mood normal.        Behavior: Behavior normal.  Thought Content: Thought content normal.     ED Results / Procedures / Treatments   Labs (all labs ordered are listed, but only abnormal results are displayed) Results for orders placed or performed during the hospital encounter of 01/17/20  Comprehensive metabolic panel   Result Value Ref Range   Sodium 139 135 - 145 mmol/L   Potassium 4.6 3.5 - 5.1 mmol/L   Chloride 100 98 - 111 mmol/L   CO2 26 22 - 32 mmol/L   Glucose, Bld 185 (H) 70 - 99 mg/dL   BUN 28 (H) 8 - 23 mg/dL   Creatinine, Ser 3.24 0.61 - 1.24 mg/dL   Calcium 8.8 (L) 8.9 - 10.3 mg/dL   Total Protein 7.1 6.5 - 8.1 g/dL   Albumin 3.5 3.5 - 5.0 g/dL   AST 15 15 - 41 U/L   ALT 14 0 - 44 U/L   Alkaline Phosphatase 75 38 - 126 U/L   Total Bilirubin 1.0 0.3 - 1.2 mg/dL   GFR calc non Af Amer >60 >60 mL/min   GFR calc Af Amer >60 >60 mL/min   Anion gap 13 5 - 15  CBC with Differential  Result Value Ref Range   WBC 9.0 4.0 - 10.5 K/uL   RBC 4.53 4.22 - 5.81 MIL/uL   Hemoglobin 13.2 13.0 - 17.0 g/dL   HCT 40.1 39 - 52 %   MCV 89.0 80.0 - 100.0 fL   MCH 29.1 26.0 - 34.0 pg   MCHC 32.8 30.0 - 36.0 g/dL   RDW 02.7 25.3 - 66.4 %   Platelets 229 150 - 400 K/uL   nRBC 0.0 0.0 - 0.2 %   Neutrophils Relative % 78 %   Neutro Abs 7.0 1.7 - 7.7 K/uL   Lymphocytes Relative 8 %   Lymphs Abs 0.8 0.7 - 4.0 K/uL   Monocytes Relative 9 %   Monocytes Absolute 0.8 0 - 1 K/uL   Eosinophils Relative 4 %   Eosinophils Absolute 0.3 0 - 0 K/uL   Basophils Relative 0 %   Basophils Absolute 0.0 0 - 0 K/uL   Immature Granulocytes 1 %   Abs Immature Granulocytes 0.06 0.00 - 0.07 K/uL  Protime-INR  Result Value Ref Range   Prothrombin Time 14.1 11.4 - 15.2 seconds   INR 1.1 0.8 - 1.2  APTT  Result Value Ref Range   aPTT 33 24 - 36 seconds  Sample to Blood Bank  Result Value Ref Range   Blood Bank Specimen SAMPLE AVAILABLE FOR TESTING    Sample Expiration      01/21/2020,2359 Performed at [facility], [address], [place], [place] [address]     Laboratory interpretation all normal except hyperglycemia nonfasting    EKG EKG Interpretation  Date/Time:  Tuesday January 18 2020 03:27:31 EDT Ventricular Rate:  87 PR Interval:    QRS Duration: 93 QT Interval:  358 QTC  Calculation: 431 R Axis:   -38 Text Interpretation: Sinus rhythm Left axis deviation Borderline T abnormalities, anterior leads Since last tracing 26 Jun 2018 T wave inversion no longer evident in Anterolateral leads Confirmed by Devoria Albe [phone]) on 01/18/2020 3:49:13 AM   Radiology DG Ribs Unilateral W/Chest Right  Result Date: 01/18/2020 CLINICAL DATA:  Recent fall with right-sided chest pain, initial encounter EXAM: RIGHT RIBS AND CHEST - 3+ VIEW COMPARISON:  01/12/2020 FINDINGS: Cardiac shadow is stable. Lungs are hypoinflated. Fractures of the right fifth, sixth and seventh ribs are seen  laterally. No pneumothorax is noted. Lateral right ninth rib fracture is noted as well. Postsurgical changes in the right shoulder are noted. No other focal abnormality is seen. IMPRESSION: Multiple right-sided rib fractures as described without complicating factors. Electronically Signed   By: Alcide Clever M.D.   On: 01/18/2020 01:41   DG Elbow Complete Left  Result Date: 01/18/2020 CLINICAL DATA:  Recent fall with left elbow pain, initial encounter EXAM: LEFT ELBOW - COMPLETE 3+ VIEW COMPARISON:  None. FINDINGS: There is no evidence of fracture, dislocation, or joint effusion. There is no evidence of arthropathy or other focal bone abnormality. Soft tissues are unremarkable. IMPRESSION: No acute abnormality noted. Electronically Signed   By: Alcide Clever M.D.   On: 01/18/2020 01:40   DG Hip Unilat W or Wo Pelvis 2-3 Views Left  Result Date: 01/18/2020 CLINICAL DATA:  Recent fall with left hip pain, initial encounter EXAM: DG HIP (WITH OR WITHOUT PELVIS) 3V LEFT COMPARISON:  None. FINDINGS: Pelvic ring is intact. Bilateral hip prostheses are seen. There is a periprosthetic fracture involving the intratrochanteric region of the proximal left femur. No other bony abnormality is seen. IMPRESSION: Bilateral hip replacements. Periprosthetic fracture in the proximal left femur Electronically Signed   By: Alcide Clever M.D.   On: 01/18/2020 01:39   DG Wrist Complete Right  Result Date: 01/11/2020 CLINICAL DATA:  Fall, right wrist pain  IMPRESSION: Impacted intra-articular fracture of the distal radius with minimal impaction of the distal radial articular surface. Fracture fragments appear anatomically aligned. Electronically Signed   By: Helyn Numbers MD   On: 01/11/2020 15:16   ECHOCARDIOGRAM COMPLETE  Result Date: 01/12/2020    ECHOCARDIOGRAM REPORT   Patient Name:   Matthew Calderon Date of Exam: 01/12/2020  IMPRESSIONS  1. Left ventricular ejection fraction, by estimation, is 55 to 60%. The left ventricle has normal function. Left ventricular endocardial border not optimally defined to evaluate regional wall motion. Left ventricular diastolic parameters are consistent with Grade I diastolic dysfunction (impaired relaxation).  2. Right ventricular systolic function is normal. The right ventricular size is normal. Tricuspid regurgitation signal is inadequate for assessing PA pressure.  3. The mitral valve is normal in structure. No evidence of mitral valve regurgitation. No evidence of mitral stenosis.  4. The aortic valve is tricuspid. Aortic valve regurgitation is trivial. Mild to moderate aortic valve sclerosis/calcification is present, without any evidence of aortic stenosis.  5. Aortic dilatation noted. There is mild dilatation of the aortic root measuring 37 mm.  6. The inferior vena cava is normal in size with greater than 50% respiratory variability, suggesting right atrial pressure of 3 mmHg.  7. Technically difficult study with poor acoustic windows. Overall LV EF is normal but difficult to comment on motion of individual wall segments.   Marca Ancona MD Signature Date/Time: 01/12/2020/4:00:07 PM    Final      Procedures Procedures (including critical care time)  Medications Ordered in ED Medications - No data to display  ED Course  I have reviewed the triage vital signs and the nursing  notes.  Pertinent labs & imaging results that were available during my care of the patient were reviewed by me and considered in my medical decision making (see chart for details).    MDM Rules/Calculators/A&P                          When I went back to talk to the patient  about his x-ray results about 2:30 AM he initially told me he wanted to have Dr. Cleophas Dunker take care of his now fractured hip.  Dr. Cleophas Dunker did the left hip replacement in 2015.  However when I went back about 3:00 AM he now states he wants to use Dr. Jerl Santos who did his right hip surgery in 2015.  Patient seems to be getting progressively more confused and agitated while he is in the ED.  Patient refused pain medication at this time.  4:30 AM Dr. Jerl Santos, orthopedist called back.  He will look at his x-rays and call me back.  5:56 AM Dr. Jerl Santos called back.  He states that the fracture looks very stable and a prosthesis is intact.  He states to stand the patient up and see if he is able to walk.  He may need a walker.  He can follow-up with the patient in 1 to 2 weeks in his office.  I talked to the patient about what we discussed with the orthopedist.  Patient tells me he feels like he is confused.  He asked me where he is living now.  We discussed that he is living at the nursing home facility.  He acknowledges he is feeling confused.  However he is able to answer a lot of other questions very well.  He states he does have a walker at home however he has a cast on his right hand so he will have difficulty using a walker.  Patient was able to ambulate with a walker with assistance.  Tech said he was unsteady after about 4 feet.  This will be relayed to his nursing facility.  When I review his medication list he was discharged from the hospital tramadol for his prior rib fractures.  Final Clinical Impression(s) / ED Diagnoses Final diagnoses:  Periprosthetic fracture around internal prosthetic left hip joint,  initial encounter [facility])    Rx / DC Orders  Plan discharge back to his facility  Devoria Albe, MD, Concha Pyo, MD 01/18/20 [phone]

## 2020-01-19 DIAGNOSIS — H409 Unspecified glaucoma: Secondary | ICD-10-CM | POA: Diagnosis not present

## 2020-01-19 DIAGNOSIS — S2239XS Fracture of one rib, unspecified side, sequela: Secondary | ICD-10-CM | POA: Diagnosis not present

## 2020-01-19 DIAGNOSIS — I5040 Unspecified combined systolic (congestive) and diastolic (congestive) heart failure: Secondary | ICD-10-CM | POA: Diagnosis not present

## 2020-01-19 DIAGNOSIS — E119 Type 2 diabetes mellitus without complications: Secondary | ICD-10-CM | POA: Diagnosis not present

## 2020-01-19 DIAGNOSIS — S0181XA Laceration without foreign body of other part of head, initial encounter: Secondary | ICD-10-CM | POA: Diagnosis not present

## 2020-01-19 DIAGNOSIS — G4733 Obstructive sleep apnea (adult) (pediatric): Secondary | ICD-10-CM | POA: Diagnosis not present

## 2020-01-19 DIAGNOSIS — N401 Enlarged prostate with lower urinary tract symptoms: Secondary | ICD-10-CM | POA: Diagnosis not present

## 2020-01-19 DIAGNOSIS — R296 Repeated falls: Secondary | ICD-10-CM | POA: Diagnosis not present

## 2020-01-19 DIAGNOSIS — D649 Anemia, unspecified: Secondary | ICD-10-CM | POA: Diagnosis not present

## 2020-01-20 DIAGNOSIS — S52501A Unspecified fracture of the lower end of right radius, initial encounter for closed fracture: Secondary | ICD-10-CM | POA: Diagnosis not present

## 2020-01-20 DIAGNOSIS — M25531 Pain in right wrist: Secondary | ICD-10-CM | POA: Diagnosis not present

## 2020-01-23 ENCOUNTER — Emergency Department (HOSPITAL_COMMUNITY)
Admission: EM | Admit: 2020-01-23 | Discharge: 2020-01-23 | Disposition: A | Discharge status: Home / Self Care | Payer: Medicare PPO | Attending: Emergency Medicine | Admitting: Emergency Medicine

## 2020-01-23 ENCOUNTER — Encounter (HOSPITAL_COMMUNITY): Payer: Self-pay | Admitting: Emergency Medicine

## 2020-01-23 DIAGNOSIS — I5032 Chronic diastolic (congestive) heart failure: Secondary | ICD-10-CM | POA: Diagnosis not present

## 2020-01-23 DIAGNOSIS — W050XXD Fall from non-moving wheelchair, subsequent encounter: Secondary | ICD-10-CM | POA: Diagnosis not present

## 2020-01-23 DIAGNOSIS — R279 Unspecified lack of coordination: Secondary | ICD-10-CM | POA: Diagnosis not present

## 2020-01-23 DIAGNOSIS — Z743 Need for continuous supervision: Secondary | ICD-10-CM | POA: Diagnosis not present

## 2020-01-23 DIAGNOSIS — Z96653 Presence of artificial knee joint, bilateral: Secondary | ICD-10-CM | POA: Diagnosis not present

## 2020-01-23 DIAGNOSIS — W19XXXA Unspecified fall, initial encounter: Secondary | ICD-10-CM | POA: Diagnosis not present

## 2020-01-23 DIAGNOSIS — F17210 Nicotine dependence, cigarettes, uncomplicated: Secondary | ICD-10-CM | POA: Insufficient documentation

## 2020-01-23 DIAGNOSIS — S42301D Unspecified fracture of shaft of humerus, right arm, subsequent encounter for fracture with routine healing: Secondary | ICD-10-CM | POA: Diagnosis not present

## 2020-01-23 DIAGNOSIS — Z79899 Other long term (current) drug therapy: Secondary | ICD-10-CM | POA: Diagnosis not present

## 2020-01-23 DIAGNOSIS — Z96643 Presence of artificial hip joint, bilateral: Secondary | ICD-10-CM | POA: Diagnosis not present

## 2020-01-23 DIAGNOSIS — Z4689 Encounter for fitting and adjustment of other specified devices: Secondary | ICD-10-CM | POA: Diagnosis not present

## 2020-01-23 DIAGNOSIS — E119 Type 2 diabetes mellitus without complications: Secondary | ICD-10-CM | POA: Insufficient documentation

## 2020-01-23 DIAGNOSIS — Z794 Long term (current) use of insulin: Secondary | ICD-10-CM | POA: Diagnosis not present

## 2020-01-23 DIAGNOSIS — S52571D Other intraarticular fracture of lower end of right radius, subsequent encounter for closed fracture with routine healing: Secondary | ICD-10-CM | POA: Insufficient documentation

## 2020-01-23 DIAGNOSIS — M7989 Other specified soft tissue disorders: Secondary | ICD-10-CM | POA: Insufficient documentation

## 2020-01-23 DIAGNOSIS — Z7982 Long term (current) use of aspirin: Secondary | ICD-10-CM | POA: Diagnosis not present

## 2020-01-23 DIAGNOSIS — I959 Hypotension, unspecified: Secondary | ICD-10-CM | POA: Diagnosis not present

## 2020-01-23 NOTE — ED Provider Notes (Signed)
[facility]-EMERGENCY DEPT Provider Note   CSN: [id number] Arrival date & time: 01/23/20  1154     History Chief Complaint  Patient presents with  . Cast Replacement    Matthew Calderon is a 86 y.o. male with PMH significant for mechanical fall on 01/11/2020 resulting in impacted intra-articular fracture of right distal radius as well as an additional fall 01/17/2020 out of his wheelchair resulting in periprosthetic fracture of proximal left femur who presents the ED via EMS for cast replacement.  I reviewed patient's medical record and when he was last evaluated on 01/17/2020, his orthopedist Dr. Jerl Santos was consulted and stated that the fracture was stable and prosthesis was intact.  Patient was also found to have multiple right-sided rib fractures without any complicating factors.  Plan was for discharge home to nursing facility with outpatient follow-up with Dr. Jerl Santos.  On my examination, patient reports that he has been having very vivid dreams recently.  He states that his vivid dreams are what led to him sustaining one of his mechanical falls.  Evidently earlier this morning he was dreaming that there was an intruder in his home and that he needed to remove the cast on his right arm in order to pull the trigger to shoot the assailant.  When he ultimately woke up, he did not have a gun and there was no intruder.  However, he was successful in slipping his cast off of his right forearm.  He is in the ED requesting replacement today.  He denies any intractable pain symptoms, chest pain or difficulty breathing, fevers or chills, new injury, weakness or numbness, or other symptoms.  He is unsure of any new medications that could be precipitating these vivid dreams.  Patient reports that he has been using his incentive spirometer intermittently, when he can remember.  Patient states that he has been using a wheelchair rather than attempting to ambulate with a walker given his recent  falls.  HPI     Past Medical History:  Diagnosis Date  . Anemia    takes iron pill  . Anxiety   . Arthritis   . Depression   . Diabetes mellitus without complication (HCC)   . GERD (gastroesophageal reflux disease)    pepto  . H/O hiatal hernia   . Pneumonia    4 years ago  . PONV (postoperative nausea and vomiting) 05/14/2019  . Post-nasal drip   . Prostate hyperplasia, benign localized, without urinary obstruction   . Seasonal allergies   . Sleep apnea    no CPAP    Patient Active Problem List   Diagnosis Date Noted  . Fall 01/14/2020  . Chronic diastolic CHF (congestive heart failure) (HCC) 01/12/2020  . Multiple rib fractures 01/11/2020  . Dysequilibrium 01/11/2020  . Fall at home, initial encounter 01/11/2020  . Head trauma, subsequent encounter 01/11/2020  . Arthritis 05/14/2019  . Enlarged prostate 05/14/2019  . History of tobacco abuse 05/14/2019  . Interstitial cystitis 05/14/2019  . Obesity 05/14/2019  . OSA (obstructive sleep apnea) 05/14/2019  . PONV (postoperative nausea and vomiting) 05/14/2019  . Post-nasal drip 05/14/2019  . Seasonal allergies 05/14/2019  . Chronic cough 07/21/2018  . GERD (gastroesophageal reflux disease) 07/21/2018  . NSTEMI (non-ST elevated myocardial infarction) (HCC) 06/20/2018  . Prostate hyperplasia, benign localized, without urinary obstruction   . Hyperglycemia   . Syncope, cardiogenic   . Degenerative joint disease (DJD) of hip 01/13/2014  . Diabetes (HCC) 01/13/2014  . Pseudophakia, both  eyes 12/01/2013  . Nonexudative age-related macular degeneration 01/16/2013  . Primary open angle glaucoma of right eye, severe stage 01/13/2013  . Allergic rhinitis 09/17/2010  . Anemia, unspecified 09/17/2010  . Gastro-esophageal reflux disease with esophagitis 12/27/2009  . Other and unspecified hyperlipidemia 12/27/2009  . Hypertrophy of prostate without urinary obstruction and other lower urinary tract symptoms (LUTS)  07/11/2009  . Insomnia 07/11/2009  . Osteoporosis 07/11/2009  . Other malaise and fatigue 06/15/2008  . Depressive disorder, not elsewhere classified 05/04/2008  . Unspecified glaucoma 05/04/2008    Past Surgical History:  Procedure Laterality Date  . BACK SURGERY  1995   discectomy  . COLONOSCOPY W/ BIOPSIES AND POLYPECTOMY    . EYE SURGERY Bilateral    cataracts  . EYE SURGERY Bilateral    glaucoma shunts  . JOINT REPLACEMENT Left 1992   knee  . JOINT REPLACEMENT Right 1996   knee  . JOINT REPLACEMENT Right 2009   hip  . JOINT REPLACEMENT Left 2005   hip  . KNEE ARTHROSCOPY Left 1981  . KNEE ARTHROSCOPY WITH PATELLA RECONSTRUCTION Right 2010  . KNEE SURGERY Bilateral 1954   knee surgery and placed in a cast for 6 weeks  . KNEE SURGERY  1961   cartilage removed  . LEG SURGERY Bilateral 1974, 1975   straighten legs  . SHOULDER OPEN ROTATOR CUFF REPAIR Right 2010  . TONSILLECTOMY    . TOTAL HIP ARTHROPLASTY Right 01/13/2014   Procedure: TOTAL HIP ARTHROPLASTY ANTERIOR APPROACH;  Surgeon: Velna Ochs, MD;  Location: [facility] OR;  Service: Orthopedics;  Laterality: Right;       Family History  Problem Relation Age of Onset  . Arthritis Mother   . Arthritis Father     Social History   Tobacco Use  . Smoking status: Former Smoker    Packs/day: 1.00    Years: 18.00    Pack years: 18.00    Types: Cigarettes    Quit date: 1978    Years since quitting: 43.6  . Smokeless tobacco: Never Used  . Tobacco comment: stopped 45 years ago  Vaping Use  . Vaping Use: Never used  Substance Use Topics  . Alcohol use: Yes    Comment: social  . Drug use: No    Home Medications Prior to Admission medications   Medication Sig Start Date End Date Taking? Authorizing Provider  aspirin EC 81 MG EC tablet Take 1 tablet (81 mg total) by mouth daily. 06/27/18   Elgergawy, Leana Roe, MD  atorvastatin (LIPITOR) 40 MG tablet Take 1 tablet (40 mg total) by mouth daily at 6 PM. Patient  taking differently: Take 40 mg by mouth every evening.  06/26/18   Elgergawy, Leana Roe, MD  brimonidine (ALPHAGAN) 0.2 % ophthalmic solution Place 1 drop into both eyes 2 (two) times daily. 09/22/17   [provider]  carvedilol (COREG) 12.5 MG tablet Take 1 tablet (12.5 mg total) by mouth 2 (two) times daily with a meal. 06/26/18   Elgergawy, Leana Roe, MD  Cholecalciferol 25 MCG (1000 UT) tablet Take 2,000 Units by mouth every morning.     [provider]  Ferrous Sulfate 143 (45 FE) MG TBCR Take 1 tablet by mouth daily.    [provider]  finasteride (PROSCAR) 5 MG tablet Take 5 mg by mouth every evening.  03/11/19   [provider]  furosemide (LASIX) 20 MG tablet Take 20 mg by mouth daily as needed for fluid or edema.  [provider]  insulin aspart (NOVOLOG) 100 UNIT/ML injection Inject 0-9 Units into the skin 3 (three) times daily with meals. Sliding scale insulin Less than 70 initiate hypoglycemia protocol 70-120  0 units 120-150 1 unit 151-200 2 units 201-250 3 units 251-300 5 units 301-350 7 units 351-400 9 units  Greater than 400 call MD 01/14/20   Meredeth IdeLama, Gagan S, MD  Melatonin 10 MG TBCR Take 1 tablet by mouth at bedtime.    [provider]  metFORMIN (GLUCOPHAGE) 500 MG tablet Take 500 mg by mouth every evening.    [provider]  Multiple Vitamins-Minerals (PRESERVISION AREDS 2) CAPS Take 1 capsule by mouth 2 (two) times daily.     [provider]  MYRBETRIQ 50 MG TB24 tablet Take 50 mg by mouth daily. 03/23/18   [provider]  potassium chloride (K-DUR,KLOR-CON) 10 MEQ tablet Take 1 tablet (10 mEq total) by mouth daily. 06/27/18   Elgergawy, Leana Roeawood S, MD  sacubitril-valsartan (ENTRESTO) 24-26 MG Take 1 tablet by mouth 2 (two) times daily. 06/26/18   Elgergawy, Leana Roeawood S, MD  sertraline (ZOLOFT) 50 MG tablet Take 50 mg by mouth every evening.    [provider]  SYNJARDY XR 25-1000 MG TB24 Take 1  tablet by mouth daily.  04/22/19   [provider]  tamsulosin (FLOMAX) 0.4 MG CAPS capsule Take 0.4 mg by mouth every evening.  03/19/18   [provider]  timolol (TIMOPTIC) 0.5 % ophthalmic solution Place 1 drop into both eyes 2 (two) times daily. 09/22/17   [provider]  traMADol (ULTRAM) 50 MG tablet Take 1 tablet (50 mg total) by mouth every 6 (six) hours as needed. 01/18/20   Devoria AlbeKnapp, Iva, MD  traZODone (DESYREL) 50 MG tablet Take 50 mg by mouth at bedtime. 03/19/18   [provider]    Allergies    Penicillins and Sulfa antibiotics  Review of Systems   Review of Systems  All other systems reviewed and are negative.   Physical Exam Updated Vital Signs BP (!) 95/59 (BP Location: Left Arm)   Pulse 70   Temp 98.1 F (36.7 C) (Oral)   Resp 16   SpO2 96%   Physical Exam Vitals and nursing note reviewed. Exam conducted with a chaperone present.  Constitutional:      General: He is not in acute distress.    Appearance: He is not ill-appearing.  HENT:     Head: Normocephalic and atraumatic.  Eyes:     General: No scleral icterus.    Conjunctiva/sclera: Conjunctivae normal.  Cardiovascular:     Rate and Rhythm: Normal rate and regular rhythm.     Pulses: Normal pulses.     Heart sounds: Normal heart sounds.  Pulmonary:     Comments: Breath sounds intact bilaterally.  Chest rises symmetric.  No respiratory distress. Musculoskeletal:     Cervical back: Normal range of motion. No rigidity.     Comments: Right forearm: Overlying ecchymoses and mild swelling involving distal forearm.  No significant swelling.  Compartments are soft.  Radial pulses intact.  Capillary refill and sensation intact throughout.  Grip strength intact.  No significant bony tenderness to palpation.  Skin:    General: Skin is dry.     Capillary Refill: Capillary refill takes less than 2 seconds.  Neurological:     Mental Status: He is alert and oriented to person, place,  and time.     GCS: GCS eye subscore is 4. GCS  verbal subscore is 5. GCS motor subscore is 6.  Psychiatric:        Mood and Affect: Mood normal.        Behavior: Behavior normal.        Thought Content: Thought content normal.     ED Results / Procedures / Treatments   Labs (all labs ordered are listed, but only abnormal results are displayed) Labs Reviewed - No data to display  EKG None  Radiology No results found.  Procedures Procedures (including critical care time)  Medications Ordered in ED Medications - No data to display  ED Course  I have reviewed the triage vital signs and the nursing notes.  Pertinent labs & imaging results that were available during my care of the patient were reviewed by me and considered in my medical decision making (see chart for details).    MDM Rules/Calculators/A&P                          Patient has no complaints and is simply requesting cast placement as he removed it last night during a vivid dream.  My physical exam is entirely unremarkable.  His compartments are soft he is neurovascularly intact.  He has been utilizing a wheelchair given his femoral fracture and recent mechanical falls.  Given his rib fractures, I once again emphasized the importance of utilizing his incentive spirometer on a regular basis.  He states that he becomes forgetful at times and appreciates the reminder.  He denies any chest pain or difficulty breathing.  Do not feel as though repeat imaging is warranted.  We will place consult for short-arm splint and he will need to be recasted by his orthopedist, Dr. Jerl Santos.  Will have patient and facility reach out to them to schedule an appointment for follow-up.  Patient is unaware of any new medications that could be contributing to his vivid dreams.  Patient will need to follow-up with his primary care provider regarding these recent changes for possible medication adjustments.  Patient's blood pressure was soft, but he  denies any fevers or chills, abdominal discomfort, urinary symptoms, cough, or other symptoms that might otherwise be concerning.  Reviewed history and he has had soft BP in the past.   SPLINT APPLICATION Date/Time: 1:28 PM Authorized by: Evelena Leyden Consent: Verbal consent obtained. Risks and benefits: risks, benefits and alternatives were discussed Consent given by: patient Splint applied by: orthopedic technician Location details: right forearm Splint type: short arm, sugar tong Post-procedure: The splinted body part was neurovascularly unchanged following the procedure. Patient tolerance: Patient tolerated the procedure well with no immediate complications.    Final Clinical Impression(s) / ED Diagnoses Final diagnoses:  Closed right arm fracture, with routine healing, subsequent encounter    Rx / DC Orders ED Discharge Orders    None       Lorelee New, PA-C 01/23/20 1328    Vanetta Mulders, MD 01/23/20 1338

## 2020-01-23 NOTE — Progress Notes (Signed)
Orthopedic Tech Progress Note Patient Details:  Matthew Calderon 04-04-1933 [id number]  Ortho Devices Type of Ortho Device: Ace wrap, Arm sling, Sugartong splint Ortho Device/Splint Location: RUE Ortho Device/Splint Interventions: Ordered, Application   Post Interventions Patient Tolerated: Well Instructions Provided: Care of device   Jennye Moccasin 01/23/2020, 1:19 PM

## 2020-01-23 NOTE — Discharge Instructions (Addendum)
Please follow-up with your primary care provider regarding today's encounter and your recent vivid dreams.  You may require medication adjustment.  I would like for you to follow-up with your orthopedist, Dr. Jerl Santos, for ongoing evaluation and management of your fractures.    Please continue to use your incentive spirometry REGULARLY to mitigate risk of pulmonary complications from your rib fractures.  Continue with your other medications, as directed.  Return to the ED or seek immediate medical attention should you experience any new or worsening symptoms.

## 2020-01-23 NOTE — ED Notes (Signed)
[facility] called for transport.  

## 2020-01-23 NOTE — ED Triage Notes (Signed)
Patient here from [facility] requesting cast replacement to right arm. Reports that he had a vivid dream last night that someone was breaking in and needed to remove cast to "pull the trigger to my gun".

## 2020-01-24 DIAGNOSIS — S0181XA Laceration without foreign body of other part of head, initial encounter: Secondary | ICD-10-CM | POA: Diagnosis not present

## 2020-01-24 DIAGNOSIS — K219 Gastro-esophageal reflux disease without esophagitis: Secondary | ICD-10-CM | POA: Diagnosis not present

## 2020-01-24 DIAGNOSIS — D509 Iron deficiency anemia, unspecified: Secondary | ICD-10-CM | POA: Diagnosis not present

## 2020-01-24 DIAGNOSIS — I5040 Unspecified combined systolic (congestive) and diastolic (congestive) heart failure: Secondary | ICD-10-CM | POA: Diagnosis not present

## 2020-01-24 DIAGNOSIS — D649 Anemia, unspecified: Secondary | ICD-10-CM | POA: Diagnosis not present

## 2020-01-24 DIAGNOSIS — N401 Enlarged prostate with lower urinary tract symptoms: Secondary | ICD-10-CM | POA: Diagnosis not present

## 2020-01-24 DIAGNOSIS — G4733 Obstructive sleep apnea (adult) (pediatric): Secondary | ICD-10-CM | POA: Diagnosis not present

## 2020-01-24 DIAGNOSIS — R296 Repeated falls: Secondary | ICD-10-CM | POA: Diagnosis not present

## 2020-01-24 DIAGNOSIS — S2239XS Fracture of one rib, unspecified side, sequela: Secondary | ICD-10-CM | POA: Diagnosis not present

## 2020-01-25 DIAGNOSIS — M25552 Pain in left hip: Secondary | ICD-10-CM | POA: Diagnosis not present

## 2020-01-25 DIAGNOSIS — M25531 Pain in right wrist: Secondary | ICD-10-CM | POA: Diagnosis not present

## 2020-01-26 DIAGNOSIS — F514 Sleep terrors [night terrors]: Secondary | ICD-10-CM | POA: Diagnosis not present

## 2020-01-26 DIAGNOSIS — F331 Major depressive disorder, recurrent, moderate: Secondary | ICD-10-CM | POA: Diagnosis not present

## 2020-01-26 DIAGNOSIS — F5101 Primary insomnia: Secondary | ICD-10-CM | POA: Diagnosis not present

## 2020-01-31 DIAGNOSIS — D649 Anemia, unspecified: Secondary | ICD-10-CM | POA: Diagnosis not present

## 2020-01-31 DIAGNOSIS — G4700 Insomnia, unspecified: Secondary | ICD-10-CM | POA: Diagnosis not present

## 2020-01-31 DIAGNOSIS — K219 Gastro-esophageal reflux disease without esophagitis: Secondary | ICD-10-CM | POA: Diagnosis not present

## 2020-01-31 DIAGNOSIS — S2239XS Fracture of one rib, unspecified side, sequela: Secondary | ICD-10-CM | POA: Diagnosis not present

## 2020-01-31 DIAGNOSIS — F514 Sleep terrors [night terrors]: Secondary | ICD-10-CM | POA: Diagnosis not present

## 2020-01-31 DIAGNOSIS — N401 Enlarged prostate with lower urinary tract symptoms: Secondary | ICD-10-CM | POA: Diagnosis not present

## 2020-01-31 DIAGNOSIS — R296 Repeated falls: Secondary | ICD-10-CM | POA: Diagnosis not present

## 2020-01-31 DIAGNOSIS — G4733 Obstructive sleep apnea (adult) (pediatric): Secondary | ICD-10-CM | POA: Diagnosis not present

## 2020-01-31 DIAGNOSIS — E559 Vitamin D deficiency, unspecified: Secondary | ICD-10-CM | POA: Diagnosis not present

## 2020-02-04 DIAGNOSIS — M9702XD Periprosthetic fracture around internal prosthetic left hip joint, subsequent encounter: Secondary | ICD-10-CM | POA: Diagnosis not present

## 2020-02-04 DIAGNOSIS — M25531 Pain in right wrist: Secondary | ICD-10-CM | POA: Diagnosis not present

## 2020-02-04 DIAGNOSIS — S52591D Other fractures of lower end of right radius, subsequent encounter for closed fracture with routine healing: Secondary | ICD-10-CM | POA: Diagnosis not present

## 2020-02-04 DIAGNOSIS — M25552 Pain in left hip: Secondary | ICD-10-CM | POA: Diagnosis not present

## 2020-02-07 DIAGNOSIS — I959 Hypotension, unspecified: Secondary | ICD-10-CM | POA: Diagnosis not present

## 2020-02-07 DIAGNOSIS — L89152 Pressure ulcer of sacral region, stage 2: Secondary | ICD-10-CM | POA: Diagnosis not present

## 2020-02-09 ENCOUNTER — Ambulatory Visit: Payer: Medicare PPO | Admitting: Podiatry

## 2020-02-09 DIAGNOSIS — F5101 Primary insomnia: Secondary | ICD-10-CM | POA: Diagnosis not present

## 2020-02-09 DIAGNOSIS — F331 Major depressive disorder, recurrent, moderate: Secondary | ICD-10-CM | POA: Diagnosis not present

## 2020-02-09 DIAGNOSIS — F514 Sleep terrors [night terrors]: Secondary | ICD-10-CM | POA: Diagnosis not present

## 2020-02-14 DIAGNOSIS — R112 Nausea with vomiting, unspecified: Secondary | ICD-10-CM | POA: Diagnosis not present

## 2020-02-15 DIAGNOSIS — D649 Anemia, unspecified: Secondary | ICD-10-CM | POA: Diagnosis not present

## 2020-02-15 DIAGNOSIS — W01190A Fall on same level from slipping, tripping and stumbling with subsequent striking against furniture, initial encounter: Secondary | ICD-10-CM | POA: Diagnosis not present

## 2020-02-15 DIAGNOSIS — D550 Anemia due to glucose-6-phosphate dehydrogenase [G6PD] deficiency: Secondary | ICD-10-CM | POA: Diagnosis not present

## 2020-02-16 DIAGNOSIS — E119 Type 2 diabetes mellitus without complications: Secondary | ICD-10-CM | POA: Diagnosis not present

## 2020-02-16 DIAGNOSIS — B372 Candidiasis of skin and nail: Secondary | ICD-10-CM | POA: Diagnosis not present

## 2020-02-16 DIAGNOSIS — R296 Repeated falls: Secondary | ICD-10-CM | POA: Diagnosis not present

## 2020-02-16 DIAGNOSIS — H409 Unspecified glaucoma: Secondary | ICD-10-CM | POA: Diagnosis not present

## 2020-02-16 DIAGNOSIS — G4733 Obstructive sleep apnea (adult) (pediatric): Secondary | ICD-10-CM | POA: Diagnosis not present

## 2020-02-16 DIAGNOSIS — G4700 Insomnia, unspecified: Secondary | ICD-10-CM | POA: Diagnosis not present

## 2020-02-16 DIAGNOSIS — D649 Anemia, unspecified: Secondary | ICD-10-CM | POA: Diagnosis not present

## 2020-02-16 DIAGNOSIS — K219 Gastro-esophageal reflux disease without esophagitis: Secondary | ICD-10-CM | POA: Diagnosis not present

## 2020-02-16 DIAGNOSIS — I5040 Unspecified combined systolic (congestive) and diastolic (congestive) heart failure: Secondary | ICD-10-CM | POA: Diagnosis not present

## 2020-02-18 DIAGNOSIS — S52591D Other fractures of lower end of right radius, subsequent encounter for closed fracture with routine healing: Secondary | ICD-10-CM | POA: Diagnosis not present

## 2020-02-18 DIAGNOSIS — M25531 Pain in right wrist: Secondary | ICD-10-CM | POA: Diagnosis not present

## 2020-02-18 DIAGNOSIS — M9702XD Periprosthetic fracture around internal prosthetic left hip joint, subsequent encounter: Secondary | ICD-10-CM | POA: Diagnosis not present

## 2020-02-18 DIAGNOSIS — M25552 Pain in left hip: Secondary | ICD-10-CM | POA: Diagnosis not present

## 2020-02-18 DIAGNOSIS — M25532 Pain in left wrist: Secondary | ICD-10-CM | POA: Diagnosis not present

## 2020-02-21 DIAGNOSIS — M1000 Idiopathic gout, unspecified site: Secondary | ICD-10-CM | POA: Diagnosis not present

## 2020-02-21 DIAGNOSIS — H401133 Primary open-angle glaucoma, bilateral, severe stage: Secondary | ICD-10-CM | POA: Diagnosis not present

## 2020-02-22 DIAGNOSIS — E785 Hyperlipidemia, unspecified: Secondary | ICD-10-CM | POA: Diagnosis not present

## 2020-02-22 DIAGNOSIS — D550 Anemia due to glucose-6-phosphate dehydrogenase [G6PD] deficiency: Secondary | ICD-10-CM | POA: Diagnosis not present

## 2020-02-22 DIAGNOSIS — E119 Type 2 diabetes mellitus without complications: Secondary | ICD-10-CM | POA: Diagnosis not present

## 2020-02-23 DIAGNOSIS — F514 Sleep terrors [night terrors]: Secondary | ICD-10-CM | POA: Diagnosis not present

## 2020-02-23 DIAGNOSIS — F5101 Primary insomnia: Secondary | ICD-10-CM | POA: Diagnosis not present

## 2020-02-23 DIAGNOSIS — F331 Major depressive disorder, recurrent, moderate: Secondary | ICD-10-CM | POA: Diagnosis not present

## 2020-03-06 DIAGNOSIS — E119 Type 2 diabetes mellitus without complications: Secondary | ICD-10-CM | POA: Diagnosis not present

## 2020-03-09 DIAGNOSIS — R2681 Unsteadiness on feet: Secondary | ICD-10-CM | POA: Diagnosis not present

## 2020-03-09 DIAGNOSIS — R2689 Other abnormalities of gait and mobility: Secondary | ICD-10-CM | POA: Diagnosis not present

## 2020-03-09 DIAGNOSIS — S52501D Unspecified fracture of the lower end of right radius, subsequent encounter for closed fracture with routine healing: Secondary | ICD-10-CM | POA: Diagnosis not present

## 2020-03-14 DIAGNOSIS — S52501D Unspecified fracture of the lower end of right radius, subsequent encounter for closed fracture with routine healing: Secondary | ICD-10-CM | POA: Diagnosis not present

## 2020-03-14 DIAGNOSIS — R2681 Unsteadiness on feet: Secondary | ICD-10-CM | POA: Diagnosis not present

## 2020-03-14 DIAGNOSIS — R2689 Other abnormalities of gait and mobility: Secondary | ICD-10-CM | POA: Diagnosis not present

## 2020-03-15 DIAGNOSIS — R2689 Other abnormalities of gait and mobility: Secondary | ICD-10-CM | POA: Diagnosis not present

## 2020-03-15 DIAGNOSIS — R2681 Unsteadiness on feet: Secondary | ICD-10-CM | POA: Diagnosis not present

## 2020-03-15 DIAGNOSIS — S52501D Unspecified fracture of the lower end of right radius, subsequent encounter for closed fracture with routine healing: Secondary | ICD-10-CM | POA: Diagnosis not present

## 2020-03-17 DIAGNOSIS — R2689 Other abnormalities of gait and mobility: Secondary | ICD-10-CM | POA: Diagnosis not present

## 2020-03-17 DIAGNOSIS — R2681 Unsteadiness on feet: Secondary | ICD-10-CM | POA: Diagnosis not present

## 2020-03-17 DIAGNOSIS — S52501D Unspecified fracture of the lower end of right radius, subsequent encounter for closed fracture with routine healing: Secondary | ICD-10-CM | POA: Diagnosis not present

## 2020-03-20 DIAGNOSIS — S52501D Unspecified fracture of the lower end of right radius, subsequent encounter for closed fracture with routine healing: Secondary | ICD-10-CM | POA: Diagnosis not present

## 2020-03-20 DIAGNOSIS — R2681 Unsteadiness on feet: Secondary | ICD-10-CM | POA: Diagnosis not present

## 2020-03-20 DIAGNOSIS — R2689 Other abnormalities of gait and mobility: Secondary | ICD-10-CM | POA: Diagnosis not present

## 2020-03-22 DIAGNOSIS — R2689 Other abnormalities of gait and mobility: Secondary | ICD-10-CM | POA: Diagnosis not present

## 2020-03-22 DIAGNOSIS — R2681 Unsteadiness on feet: Secondary | ICD-10-CM | POA: Diagnosis not present

## 2020-03-22 DIAGNOSIS — S52501D Unspecified fracture of the lower end of right radius, subsequent encounter for closed fracture with routine healing: Secondary | ICD-10-CM | POA: Diagnosis not present

## 2020-04-05 ENCOUNTER — Ambulatory Visit (INDEPENDENT_AMBULATORY_CARE_PROVIDER_SITE_OTHER): Payer: Medicare PPO | Admitting: Podiatry

## 2020-04-05 ENCOUNTER — Other Ambulatory Visit: Payer: Self-pay

## 2020-04-05 DIAGNOSIS — M79674 Pain in right toe(s): Secondary | ICD-10-CM | POA: Diagnosis not present

## 2020-04-05 DIAGNOSIS — B351 Tinea unguium: Secondary | ICD-10-CM

## 2020-04-05 DIAGNOSIS — M79675 Pain in left toe(s): Secondary | ICD-10-CM | POA: Diagnosis not present

## 2020-04-05 NOTE — Progress Notes (Signed)
Subjective:   Patient ID: Matthew Calderon, male   DOB: 86 y.o.   MRN: [id number]   HPI Patient presents with chronic nail disease 1-5 both feet that become painful and he cannot cut with the right hallux being ingrown in the corner and very sore   ROS      Objective:  Physical Exam  Neurovascular status intact negative Denna Haggard' sign noted with nail disease 1-5 both feet with all nailbeds incurvated thick and painful with special irritation around the right hallux left      Assessment:  Mycotic nail infection with pain 1-5 both feet     Plan:  H&P debrided nailbeds 1-5 both feet with no iatrogenic bleeding reappoint routine care

## 2020-05-01 DIAGNOSIS — E559 Vitamin D deficiency, unspecified: Secondary | ICD-10-CM | POA: Diagnosis not present

## 2020-05-01 DIAGNOSIS — I10 Essential (primary) hypertension: Secondary | ICD-10-CM | POA: Diagnosis not present

## 2020-05-01 DIAGNOSIS — M109 Gout, unspecified: Secondary | ICD-10-CM | POA: Diagnosis not present

## 2020-05-01 DIAGNOSIS — E118 Type 2 diabetes mellitus with unspecified complications: Secondary | ICD-10-CM | POA: Diagnosis not present

## 2020-05-01 DIAGNOSIS — E789 Disorder of lipoprotein metabolism, unspecified: Secondary | ICD-10-CM | POA: Diagnosis not present

## 2020-05-08 DIAGNOSIS — E291 Testicular hypofunction: Secondary | ICD-10-CM | POA: Diagnosis not present

## 2020-05-08 DIAGNOSIS — E118 Type 2 diabetes mellitus with unspecified complications: Secondary | ICD-10-CM | POA: Diagnosis not present

## 2020-05-08 DIAGNOSIS — M810 Age-related osteoporosis without current pathological fracture: Secondary | ICD-10-CM | POA: Diagnosis not present

## 2020-05-08 DIAGNOSIS — Z0000 Encounter for general adult medical examination without abnormal findings: Secondary | ICD-10-CM | POA: Diagnosis not present

## 2020-05-08 DIAGNOSIS — K5730 Diverticulosis of large intestine without perforation or abscess without bleeding: Secondary | ICD-10-CM | POA: Diagnosis not present

## 2020-07-12 ENCOUNTER — Ambulatory Visit: Payer: Medicare PPO | Admitting: Podiatry

## 2020-08-25 ENCOUNTER — Encounter: Payer: Self-pay | Admitting: Podiatry

## 2020-08-25 ENCOUNTER — Other Ambulatory Visit: Payer: Self-pay

## 2020-08-25 ENCOUNTER — Ambulatory Visit (INDEPENDENT_AMBULATORY_CARE_PROVIDER_SITE_OTHER): Payer: Medicare PPO | Admitting: Podiatry

## 2020-08-25 DIAGNOSIS — L853 Xerosis cutis: Secondary | ICD-10-CM | POA: Diagnosis not present

## 2020-08-25 DIAGNOSIS — E119 Type 2 diabetes mellitus without complications: Secondary | ICD-10-CM

## 2020-08-25 DIAGNOSIS — B351 Tinea unguium: Secondary | ICD-10-CM | POA: Diagnosis not present

## 2020-08-25 DIAGNOSIS — S90812A Abrasion, left foot, initial encounter: Secondary | ICD-10-CM

## 2020-08-25 DIAGNOSIS — M79675 Pain in left toe(s): Secondary | ICD-10-CM | POA: Diagnosis not present

## 2020-08-25 DIAGNOSIS — Z794 Long term (current) use of insulin: Secondary | ICD-10-CM

## 2020-08-25 DIAGNOSIS — M79674 Pain in right toe(s): Secondary | ICD-10-CM | POA: Diagnosis not present

## 2020-08-25 NOTE — Patient Instructions (Signed)
For normal skin: Moisturize feet once daily; do not apply between toes A.  CeraVe Daily Moisturizing Lotion B.  Vaseline Intensive Care Lotion C.  Lubriderm Lotion D.  Gold Bond Diabetic Foot Lotion E.  Eucerin Intensive Repair Moisturizing Lotion  For extremely dry, cracked feet: moisturize feet once daily; do not apply between toes A. CeraVe Healing Ointment B. Aquaphor Healing Ointment C. Vaseline Petroleum Healing Jelly   If you have problems reaching your feet: apply to feet once daily; do not apply between toes A.  Aquaphor Advanced Therapy Ointment Body Spray B.  Vaseline Intensive Care Spray Lotion Advanced Repair    

## 2020-08-25 NOTE — Progress Notes (Signed)
Subjective: Matthew Calderon is a pleasant 87 y.o. male patient seen today for preventative diabetic foot care for elongated, painful, thick toenails that are difficult to trim. Pain interferes with ambulation. Aggravating factors include wearing enclosed shoe gear. Pain is relieved with periodic professional debridement.  He has a scab on the top of his left foot and states he may have rubbed the area with his heel in bed.  Patient states he does not routinely check his blood glucose. He does reside at [facility].  He states right 5th and right 4th toenails are tender due to excessive length. He states he has a scab on his left foot from scratching due to dry skin. He voices no other pedal concerns on today's visit.    Allergies  Allergen Reactions   Penicillins Hives    DID THE REACTION INVOLVE: Swelling of the face/tongue/throat, SOB, or low BP? No Sudden or severe rash/hives, skin peeling, or the inside of the mouth or nose? No Did it require medical treatment? No When did it last happen?childhood allergy If all above answers are NO, may proceed with cephalosporin use.    Sulfa Antibiotics Hives   Objective: Physical Exam  General: Matthew Calderon is a pleasant 87 y.o. Caucasian male, WD, WN in NAD. AAO x 3.   Vascular:  Capillary refill time to digits immediate b/l. Palpable pedal pulses b/l LE. Pedal hair absent b/l lower extremities. Lower extremity skin temperature gradient within normal limits. No pain with calf compression b/l. No edema noted b/l lower extremities.   Dermatological:  No open wounds bilaterally. No interdigital macerations bilaterally. Toenails 1-5 b/l elongated, discolored, dystrophic, thickened, crumbly with subungual debris and tenderness to dorsal palpation. No hyperkeratotic nor porokeratotic lesions present on today's visit. Pedal skin dry and flaky b/l feet.  Healed scab noted dorsal aspect of left forefoot area. No erythema, no edema, no  drainage, no fluctuance.  Musculoskeletal:  Normal muscle strength 5/5 to all lower extremity muscle groups bilaterally. No pain crepitus or joint limitation noted with ROM b/l. Hallux valgus with bunion deformity noted b/l lower extremities.  Neurological:  Protective sensation intact 5/5 intact bilaterally with 10g monofilament b/l. Vibratory sensation intact b/l. Clonus negative b/l.  Assessment and Plan:  1. Pain due to onychomycosis of toenails of both feet   2. Abrasion of left foot, initial encounter   3. Xerosis cutis   4. Type 2 diabetes mellitus without complication, with long-term current use of insulin (HCC)     -Examined patient. -For healing abrasion dorsal left forefoot, he was instructed to apply antibiotic ointment daily until healed.  -Dispensed list of moisturizers he may purchase OTC for daily moisturizing. -Toenails 1-5 b/l were debrided in length and girth with sterile nail nippers and dremel without iatrogenic bleeding.  -Patient to report any pedal injuries to medical professional immediately. -Patient to continue soft, supportive shoe gear daily. -Patient/POA to call should there be question/concern in the interim.  Return in about 3 months (around 11/25/2020).  [name], DPM

## 2020-08-28 DIAGNOSIS — H401133 Primary open-angle glaucoma, bilateral, severe stage: Secondary | ICD-10-CM | POA: Diagnosis not present

## 2020-10-02 ENCOUNTER — Ambulatory Visit: Payer: Medicare PPO | Admitting: Podiatry

## 2020-10-27 ENCOUNTER — Ambulatory Visit: Payer: Medicare PPO | Admitting: Podiatry

## 2020-10-30 DIAGNOSIS — E789 Disorder of lipoprotein metabolism, unspecified: Secondary | ICD-10-CM | POA: Diagnosis not present

## 2020-10-30 DIAGNOSIS — E119 Type 2 diabetes mellitus without complications: Secondary | ICD-10-CM | POA: Diagnosis not present

## 2020-11-06 DIAGNOSIS — E118 Type 2 diabetes mellitus with unspecified complications: Secondary | ICD-10-CM | POA: Diagnosis not present

## 2020-11-06 DIAGNOSIS — E789 Disorder of lipoprotein metabolism, unspecified: Secondary | ICD-10-CM | POA: Diagnosis not present

## 2020-11-06 DIAGNOSIS — J309 Allergic rhinitis, unspecified: Secondary | ICD-10-CM | POA: Diagnosis not present

## 2020-11-06 DIAGNOSIS — M810 Age-related osteoporosis without current pathological fracture: Secondary | ICD-10-CM | POA: Diagnosis not present

## 2020-11-07 DIAGNOSIS — H353132 Nonexudative age-related macular degeneration, bilateral, intermediate dry stage: Secondary | ICD-10-CM | POA: Diagnosis not present

## 2020-11-07 DIAGNOSIS — H35373 Puckering of macula, bilateral: Secondary | ICD-10-CM | POA: Diagnosis not present

## 2020-11-07 DIAGNOSIS — Z961 Presence of intraocular lens: Secondary | ICD-10-CM | POA: Diagnosis not present

## 2020-11-07 DIAGNOSIS — H3581 Retinal edema: Secondary | ICD-10-CM | POA: Insufficient documentation

## 2020-11-07 DIAGNOSIS — H401133 Primary open-angle glaucoma, bilateral, severe stage: Secondary | ICD-10-CM | POA: Diagnosis not present

## 2020-11-07 DIAGNOSIS — E119 Type 2 diabetes mellitus without complications: Secondary | ICD-10-CM | POA: Diagnosis not present

## 2020-11-07 DIAGNOSIS — Z7984 Long term (current) use of oral hypoglycemic drugs: Secondary | ICD-10-CM | POA: Diagnosis not present

## 2020-11-23 DIAGNOSIS — M810 Age-related osteoporosis without current pathological fracture: Secondary | ICD-10-CM | POA: Diagnosis not present

## 2020-11-23 DIAGNOSIS — I5022 Chronic systolic (congestive) heart failure: Secondary | ICD-10-CM | POA: Diagnosis not present

## 2020-11-23 DIAGNOSIS — E118 Type 2 diabetes mellitus with unspecified complications: Secondary | ICD-10-CM | POA: Diagnosis not present

## 2020-11-23 DIAGNOSIS — I10 Essential (primary) hypertension: Secondary | ICD-10-CM | POA: Diagnosis not present

## 2020-12-04 ENCOUNTER — Encounter: Payer: Self-pay | Admitting: Podiatry

## 2020-12-04 ENCOUNTER — Other Ambulatory Visit: Payer: Self-pay

## 2020-12-04 ENCOUNTER — Ambulatory Visit (INDEPENDENT_AMBULATORY_CARE_PROVIDER_SITE_OTHER): Payer: Medicare PPO | Admitting: Podiatry

## 2020-12-04 DIAGNOSIS — M79674 Pain in right toe(s): Secondary | ICD-10-CM

## 2020-12-04 DIAGNOSIS — B351 Tinea unguium: Secondary | ICD-10-CM

## 2020-12-04 DIAGNOSIS — M79675 Pain in left toe(s): Secondary | ICD-10-CM

## 2020-12-08 NOTE — Progress Notes (Signed)
  Subjective:  Patient ID: Matthew Calderon, male    DOB: [birth year],  MRN: [id number]  Matthew Calderon presents to clinic today for preventative diabetic foot care and painful thick toenails that are difficult to trim. Pain interferes with ambulation. Aggravating factors include wearing enclosed shoe gear. Pain is relieved with periodic professional debridement.  Allergies  Allergen Reactions   Penicillins Hives    DID THE REACTION INVOLVE: Swelling of the face/tongue/throat, SOB, or low BP? No Sudden or severe rash/hives, skin peeling, or the inside of the mouth or nose? No Did it require medical treatment? No When did it last happen?childhood allergy       If all above answers are "NO", may proceed with cephalosporin use.    Sulfa Antibiotics Hives    Review of Systems: Negative except as noted in the HPI. Objective:   Constitutional Matthew Calderon is a pleasant 87 y.o. Caucasian male, WD, WN in NAD. AAO x 3.   Vascular Capillary fill time to digits <3 seconds b/l lower extremities. Palpable pedal pulses b/l LE. Pedal hair sparse. Lower extremity skin temperature gradient within normal limits. No cyanosis or clubbing noted.  Neurologic Normal speech. Oriented to person, place, and time. Protective sensation intact 5/5 intact bilaterally with 10g monofilament b/l. Vibratory sensation intact b/l.  Dermatologic Pedal skin with normal turgor, texture and tone bilaterally. No open wounds bilaterally. No interdigital macerations bilaterally. Toenails 1-5 b/l elongated, discolored, dystrophic, thickened, crumbly with subungual debris and tenderness to dorsal palpation.  Orthopedic: Normal muscle strength 5/5 to all lower extremity muscle groups bilaterally. No pain crepitus or joint limitation noted with ROM b/l. Hallux valgus with bunion deformity noted b/l lower extremities.   Radiographs: None Assessment:   1. Pain due to onychomycosis of toenails of both feet    Plan:  Patient was  evaluated and treated and all questions answered.  Onychomycosis with pain -Nails palliatively debridement as below -Educated on self-care  Procedure: Nail Debridement Rationale: Pain Type of Debridement: manual, sharp debridement. Instrumentation: Nail nipper, rotary burr. Number of Nails: 10 -Examined patient. -Continue diabetic foot care principles. -Patient to continue soft, supportive shoe gear daily. -Toenails 1-5 b/l were debrided in length and girth with sterile nail nippers and dremel without iatrogenic bleeding.  -Patient to report any pedal injuries to medical professional immediately. -Patient/POA to call should there be question/concern in the interim.  Return in about 3 months (around 03/06/2021).  [name], DPM

## 2021-01-25 DIAGNOSIS — I10 Essential (primary) hypertension: Secondary | ICD-10-CM | POA: Diagnosis not present

## 2021-01-25 DIAGNOSIS — J302 Other seasonal allergic rhinitis: Secondary | ICD-10-CM | POA: Diagnosis not present

## 2021-01-25 DIAGNOSIS — M810 Age-related osteoporosis without current pathological fracture: Secondary | ICD-10-CM | POA: Diagnosis not present

## 2021-01-25 DIAGNOSIS — E118 Type 2 diabetes mellitus with unspecified complications: Secondary | ICD-10-CM | POA: Diagnosis not present

## 2021-02-28 DIAGNOSIS — J309 Allergic rhinitis, unspecified: Secondary | ICD-10-CM | POA: Diagnosis not present

## 2021-02-28 DIAGNOSIS — R400 Somnolence: Secondary | ICD-10-CM | POA: Diagnosis not present

## 2021-02-28 DIAGNOSIS — I10 Essential (primary) hypertension: Secondary | ICD-10-CM | POA: Diagnosis not present

## 2021-02-28 DIAGNOSIS — Z8659 Personal history of other mental and behavioral disorders: Secondary | ICD-10-CM | POA: Diagnosis not present

## 2021-03-12 ENCOUNTER — Ambulatory Visit (INDEPENDENT_AMBULATORY_CARE_PROVIDER_SITE_OTHER): Payer: Medicare PPO | Admitting: Podiatry

## 2021-03-12 ENCOUNTER — Encounter: Payer: Self-pay | Admitting: Podiatry

## 2021-03-12 ENCOUNTER — Other Ambulatory Visit: Payer: Self-pay

## 2021-03-12 DIAGNOSIS — L601 Onycholysis: Secondary | ICD-10-CM | POA: Diagnosis not present

## 2021-03-12 DIAGNOSIS — M79675 Pain in left toe(s): Secondary | ICD-10-CM

## 2021-03-12 DIAGNOSIS — M79674 Pain in right toe(s): Secondary | ICD-10-CM

## 2021-03-12 DIAGNOSIS — B351 Tinea unguium: Secondary | ICD-10-CM

## 2021-03-12 DIAGNOSIS — E119 Type 2 diabetes mellitus without complications: Secondary | ICD-10-CM

## 2021-03-12 DIAGNOSIS — Z794 Long term (current) use of insulin: Secondary | ICD-10-CM

## 2021-03-12 NOTE — Progress Notes (Signed)
Subjective: Matthew Calderon is a 87 y.o. male patient seen today for follow up of  painful thick toenails that are difficult to trim. Pain interferes with ambulation. Aggravating factors include wearing enclosed shoe gear. Pain is relieved with periodic professional debridement.  Patient does not routinely monitor his blood glucose.  PCP is Merri Brunette, MD. Last visit was: 02/28/2021.  New problems reported today: None.  Allergies  Allergen Reactions   Alendronate Sodium     Other reaction(s): diarrhea   Penicillin G     Other reaction(s): Unknown   Penicillins Hives    DID THE REACTION INVOLVE: Swelling of the face/tongue/throat, SOB, or low BP? No Sudden or severe rash/hives, skin peeling, or the inside of the mouth or nose? No Did it require medical treatment? No When did it last happen?childhood allergy       If all above answers are "NO", may proceed with cephalosporin use.    Sulfa Antibiotics Hives   Sulfacetamide Sodium     Other reaction(s): Unknown    PCP is Merri Brunette, MD .  Objective: Physical Exam  General: Patient is a pleasant 87 y.o. Caucasian male in NAD. AAO x 3.   Neurovascular Examination:Capillary fill time to digits <3 seconds b/l lower extremities. Palpable DP pulse(s) b/l lower extremities Palpable PT pulse(s) b/l lower extremities Pedal hair sparse. Lower extremity skin temperature gradient within normal limits. No pain with calf compression b/l.  Protective sensation intact 5/5 intact bilaterally with 10g monofilament b/l. Vibratory sensation intact b/l. Protective sensation intact 5/5 intact bilaterally with 10g monofilament b/l. Vibratory sensation intact b/l.  Dermatological:  Pedal skin with normal turgor, texture and tone b/l lower extremities. Toenails 1-5 b/l elongated, discolored, dystrophic, thickened, crumbly with subungual debris and tenderness to dorsal palpation. There is noted onchyolysis of entire nailplate of L 2nd toe. The nailbed  remains intact. There is pinpoint bleeding. There is no erythema, no edema, no drainage, no underlying fluctuance.  Musculoskeletal:  Normal muscle strength 5/5 to all lower extremity muscle groups bilaterally. Hallux valgus with bunion deformity noted b/l lower extremities.   Assessment: 1. Pain due to onychomycosis of toenails of both feet   2. Onycholysis of toenail   3. Type 2 diabetes mellitus without complication, with long-term current use of insulin (HCC)    Plan: Patient was evaluated and treated and all questions answered. Consent given for treatment as described below: -Examined patient. -Continue diabetic foot care principles: inspect feet daily, monitor glucose as recommended by PCP and/or Endocrinologist, and follow prescribed diet per PCP, Endocrinologist and/or dietician. -Patient to continue soft, supportive shoe gear daily. -Toenails 1-5 right, L hallux, L 3rd toe, L 4th toe, and L 5th toe debrided in length and girth without iatrogenic bleeding with sterile nail nipper and dremel.  -Left 2nd toe nailplate gently debrided from it's remaining attachment to digit. Nailbed cleansed with alcohol. Triple antibiotic ointment and band-aid applied. He may remove band-aid on tomorrow. -Patient to report any pedal injuries to medical professional immediately. -Patient/POA to call should there be question/concern in the interim.  Return in about 3 months (around 06/11/2021).  [name], DPM

## 2021-03-14 DIAGNOSIS — M810 Age-related osteoporosis without current pathological fracture: Secondary | ICD-10-CM | POA: Diagnosis not present

## 2021-03-17 ENCOUNTER — Encounter: Payer: Self-pay | Admitting: Podiatry

## 2021-03-17 DIAGNOSIS — L304 Erythema intertrigo: Secondary | ICD-10-CM | POA: Insufficient documentation

## 2021-03-17 DIAGNOSIS — I10 Essential (primary) hypertension: Secondary | ICD-10-CM | POA: Insufficient documentation

## 2021-03-17 DIAGNOSIS — I739 Peripheral vascular disease, unspecified: Secondary | ICD-10-CM | POA: Insufficient documentation

## 2021-03-17 DIAGNOSIS — Z0001 Encounter for general adult medical examination with abnormal findings: Secondary | ICD-10-CM | POA: Insufficient documentation

## 2021-03-17 DIAGNOSIS — M810 Age-related osteoporosis without current pathological fracture: Secondary | ICD-10-CM | POA: Insufficient documentation

## 2021-03-17 DIAGNOSIS — M419 Scoliosis, unspecified: Secondary | ICD-10-CM | POA: Insufficient documentation

## 2021-03-17 DIAGNOSIS — E789 Disorder of lipoprotein metabolism, unspecified: Secondary | ICD-10-CM | POA: Insufficient documentation

## 2021-03-17 DIAGNOSIS — K5730 Diverticulosis of large intestine without perforation or abscess without bleeding: Secondary | ICD-10-CM | POA: Insufficient documentation

## 2021-03-17 DIAGNOSIS — E291 Testicular hypofunction: Secondary | ICD-10-CM | POA: Insufficient documentation

## 2021-03-17 DIAGNOSIS — E559 Vitamin D deficiency, unspecified: Secondary | ICD-10-CM | POA: Insufficient documentation

## 2021-03-17 DIAGNOSIS — Z8659 Personal history of other mental and behavioral disorders: Secondary | ICD-10-CM | POA: Insufficient documentation

## 2021-03-17 DIAGNOSIS — R5381 Other malaise: Secondary | ICD-10-CM | POA: Insufficient documentation

## 2021-03-17 DIAGNOSIS — H3530 Unspecified macular degeneration: Secondary | ICD-10-CM | POA: Insufficient documentation

## 2021-03-17 DIAGNOSIS — E538 Deficiency of other specified B group vitamins: Secondary | ICD-10-CM | POA: Insufficient documentation

## 2021-03-17 DIAGNOSIS — M2170 Unequal limb length (acquired), unspecified site: Secondary | ICD-10-CM | POA: Insufficient documentation

## 2021-03-17 DIAGNOSIS — M109 Gout, unspecified: Secondary | ICD-10-CM | POA: Insufficient documentation

## 2021-03-17 DIAGNOSIS — Z880 Allergy status to penicillin: Secondary | ICD-10-CM | POA: Insufficient documentation

## 2021-03-17 DIAGNOSIS — D509 Iron deficiency anemia, unspecified: Secondary | ICD-10-CM | POA: Insufficient documentation

## 2021-03-17 DIAGNOSIS — R400 Somnolence: Secondary | ICD-10-CM | POA: Insufficient documentation

## 2021-04-09 ENCOUNTER — Emergency Department (HOSPITAL_COMMUNITY)
Admission: EM | Admit: 2021-04-09 | Discharge: 2021-04-10 | Disposition: A | Discharge status: Home / Self Care | Payer: Medicare PPO | Attending: Student | Admitting: Student

## 2021-04-09 ENCOUNTER — Emergency Department (HOSPITAL_COMMUNITY): Payer: Medicare PPO

## 2021-04-09 ENCOUNTER — Encounter (HOSPITAL_COMMUNITY): Payer: Self-pay

## 2021-04-09 DIAGNOSIS — W19XXXA Unspecified fall, initial encounter: Secondary | ICD-10-CM | POA: Diagnosis not present

## 2021-04-09 DIAGNOSIS — S0003XA Contusion of scalp, initial encounter: Secondary | ICD-10-CM | POA: Diagnosis not present

## 2021-04-09 DIAGNOSIS — E041 Nontoxic single thyroid nodule: Secondary | ICD-10-CM | POA: Diagnosis not present

## 2021-04-09 DIAGNOSIS — S199XXA Unspecified injury of neck, initial encounter: Secondary | ICD-10-CM | POA: Diagnosis not present

## 2021-04-09 DIAGNOSIS — W01198A Fall on same level from slipping, tripping and stumbling with subsequent striking against other object, initial encounter: Secondary | ICD-10-CM | POA: Diagnosis not present

## 2021-04-09 DIAGNOSIS — M4312 Spondylolisthesis, cervical region: Secondary | ICD-10-CM | POA: Diagnosis not present

## 2021-04-09 DIAGNOSIS — Y900 Blood alcohol level of less than 20 mg/100 ml: Secondary | ICD-10-CM | POA: Diagnosis not present

## 2021-04-09 DIAGNOSIS — S0990XA Unspecified injury of head, initial encounter: Secondary | ICD-10-CM | POA: Diagnosis not present

## 2021-04-09 DIAGNOSIS — R296 Repeated falls: Secondary | ICD-10-CM | POA: Insufficient documentation

## 2021-04-09 DIAGNOSIS — Z7901 Long term (current) use of anticoagulants: Secondary | ICD-10-CM | POA: Diagnosis not present

## 2021-04-09 DIAGNOSIS — S0101XA Laceration without foreign body of scalp, initial encounter: Secondary | ICD-10-CM | POA: Insufficient documentation

## 2021-04-09 DIAGNOSIS — R519 Headache, unspecified: Secondary | ICD-10-CM | POA: Diagnosis not present

## 2021-04-09 DIAGNOSIS — S060X0A Concussion without loss of consciousness, initial encounter: Secondary | ICD-10-CM | POA: Diagnosis not present

## 2021-04-09 DIAGNOSIS — M47812 Spondylosis without myelopathy or radiculopathy, cervical region: Secondary | ICD-10-CM | POA: Diagnosis not present

## 2021-04-09 DIAGNOSIS — Z043 Encounter for examination and observation following other accident: Secondary | ICD-10-CM | POA: Diagnosis not present

## 2021-04-09 DIAGNOSIS — I700 Atherosclerosis of aorta: Secondary | ICD-10-CM | POA: Diagnosis not present

## 2021-04-09 LAB — COMPREHENSIVE METABOLIC PANEL
ALT: 13 U/L (ref 0–44)
AST: 13 U/L — ABNORMAL LOW (ref 15–41)
Albumin: 3.7 g/dL (ref 3.5–5.0)
Alkaline Phosphatase: 87 U/L (ref 38–126)
Anion gap: 7 (ref 5–15)
BUN: 31 mg/dL — ABNORMAL HIGH (ref 8–23)
CO2: 21 mmol/L — ABNORMAL LOW (ref 22–32)
Calcium: 8.3 mg/dL — ABNORMAL LOW (ref 8.9–10.3)
Chloride: 110 mmol/L (ref 98–111)
Creatinine, Ser: 1.18 mg/dL (ref 0.61–1.24)
GFR, Estimated: >60 mL/min (ref >60)
Glucose, Bld: 129 mg/dL — ABNORMAL HIGH (ref 70–99)
Potassium: 4.6 mmol/L (ref 3.5–5.1)
Sodium: 138 mmol/L (ref 135–145)
Total Bilirubin: 0.4 mg/dL (ref 0.3–1.2)
Total Protein: 6.8 g/dL (ref 6.5–8.1)

## 2021-04-09 LAB — CBC
HCT: 45.2 % (ref 39.0–52.0)
Hemoglobin: 14.6 g/dL (ref 13.0–17.0)
MCH: 29 pg (ref 26.0–34.0)
MCHC: 32.3 g/dL (ref 30.0–36.0)
MCV: 89.7 fL (ref 80.0–100.0)
Platelets: 177 10*3/uL (ref 150–400)
RBC: 5.04 MIL/uL (ref 4.22–5.81)
RDW: 14 % (ref 11.5–15.5)
WBC: 7 10*3/uL (ref 4.0–10.5)
nRBC: 0 % (ref 0.0–0.2)

## 2021-04-09 LAB — I-STAT CHEM 8, ED
BUN: 31 mg/dL — ABNORMAL HIGH (ref 8–23)
Calcium, Ion: 1.08 mmol/L — ABNORMAL LOW (ref 1.15–1.40)
Chloride: 111 mmol/L (ref 98–111)
Creatinine, Ser: 1.2 mg/dL (ref 0.61–1.24)
Glucose, Bld: 120 mg/dL — ABNORMAL HIGH (ref 70–99)
HCT: 43 % (ref 39.0–52.0)
Hemoglobin: 14.6 g/dL (ref 13.0–17.0)
Potassium: 4.8 mmol/L (ref 3.5–5.1)
Sodium: 141 mmol/L (ref 135–145)
TCO2: 20 mmol/L — ABNORMAL LOW (ref 22–32)

## 2021-04-09 LAB — ETHANOL: Alcohol, Ethyl (B): <10 mg/dL (ref <10)

## 2021-04-09 LAB — PROTIME-INR
INR: 1 (ref 0.8–1.2)
Prothrombin Time: 13.6 seconds (ref 11.4–15.2)

## 2021-04-09 LAB — LACTIC ACID, PLASMA: Lactic Acid, Venous: 1.7 mmol/L (ref 0.5–1.9)

## 2021-04-09 LAB — SAMPLE TO BLOOD BANK

## 2021-04-09 NOTE — ED Provider Notes (Signed)
[facility] EMERGENCY DEPARTMENT Provider Note   CSN: [id number] Arrival date & time: 04/09/21  1658     History Chief Complaint  Patient presents with   Level 2 Fall on Thinners    Matthew Calderon is a 87 y.o. male who presents the emergency department as a level 2 trauma for a fall on blood thinners.  Patient is currently on Eliquis.  He was outside using his walker when he had a mechanical fall and struck the back of his head.  No loss of consciousness.  Arrives with a posterior scalp laceration that is currently controlled.  Endorses headache but denies chest pain, shortness of breath, abdominal pain, nausea, vomiting or other traumatic or systemic complaints.  HPI     No past medical history on file.  There are no problems to display for this patient.        No family history on file.     Home Medications Prior to Admission medications   Not on File    Allergies    Patient has no allergy information on record.  Review of Systems   Review of Systems  Constitutional:  Negative for chills and fever.  HENT:  Negative for ear pain and sore throat.   Eyes:  Negative for pain and visual disturbance.  Respiratory:  Negative for cough and shortness of breath.   Cardiovascular:  Negative for chest pain and palpitations.  Gastrointestinal:  Negative for abdominal pain and vomiting.  Genitourinary:  Negative for dysuria and hematuria.  Musculoskeletal:  Negative for arthralgias and back pain.  Skin:  Negative for color change and rash.  Neurological:  Positive for headaches. Negative for seizures and syncope.  All other systems reviewed and are negative.  Physical Exam Updated Vital Signs BP (!) 122/40   Pulse 72   Temp 98.2 F (36.8 C) (Oral)   Resp 17   Ht 4\' 11"  (1.499 m)   Wt 68 kg   SpO2 96%   BMI 30.30 kg/m   Physical Exam Vitals and nursing note reviewed.  Constitutional:      Appearance: He is well-developed.  HENT:     Head:  Normocephalic and atraumatic.  Eyes:     Conjunctiva/sclera: Conjunctivae normal.  Cardiovascular:     Rate and Rhythm: Normal rate and regular rhythm.     Heart sounds: No murmur heard. Pulmonary:     Effort: Pulmonary effort is normal. No respiratory distress.     Breath sounds: Normal breath sounds.  Abdominal:     Palpations: Abdomen is soft.     Tenderness: There is no abdominal tenderness.  Musculoskeletal:     Cervical back: Neck supple.  Skin:    General: Skin is warm and dry.     Findings: No lesion (0.5 cm laceration to the occiput with noted hematoma).  Neurological:     Mental Status: He is alert.    ED Results / Procedures / Treatments   Labs (all labs ordered are listed, but only abnormal results are displayed) Labs Reviewed  COMPREHENSIVE METABOLIC PANEL - Abnormal; Notable for the following components:      Result Value   CO2 21 (*)    Glucose, Bld 129 (*)    BUN 31 (*)    Calcium 8.3 (*)    AST 13 (*)    All other components within normal limits  I-STAT CHEM 8, ED - Abnormal; Notable for the following components:   BUN 31 (*)  Glucose, Bld 120 (*)    Calcium, Ion 1.08 (*)    TCO2 20 (*)    All other components within normal limits  CBC  ETHANOL  LACTIC ACID, PLASMA  PROTIME-INR  URINALYSIS, ROUTINE W REFLEX MICROSCOPIC  SAMPLE TO BLOOD BANK    EKG None  Radiology DG Chest 1 View  Result Date: 04/09/2021 CLINICAL DATA:  Fall from standing position EXAM: CHEST  1 VIEW COMPARISON:  01/18/2020 FINDINGS: No focal opacity or pleural effusion. Stable cardiomediastinal silhouette with aortic atherosclerosis. No pneumothorax. Chronic appearing bilateral rib fractures IMPRESSION: No active disease. Electronically Signed   By: Jasmine Pang M.D.   On: 04/09/2021 18:44   DG Pelvis 1-2 Views  Result Date: 04/09/2021 CLINICAL DATA:  Fall from standing position EXAM: PELVIS - 1-2 VIEW COMPARISON:  CT 06/20/2018, 01/18/2020 FINDINGS: SI joints are non  widened. Pubic symphysis and rami appear intact. Bilateral hip replacements with intact hardware and normal alignment. No acute displaced fracture is seen. IMPRESSION: Bilateral hip replacements without acute osseous abnormality Electronically Signed   By: Jasmine Pang M.D.   On: 04/09/2021 18:46   CT Head Wo Contrast  Result Date: 04/09/2021 CLINICAL DATA:  Head trauma, mod-severe fall on thinners EXAM: CT HEAD WITHOUT CONTRAST TECHNIQUE: Contiguous axial images were obtained from the base of the skull through the vertex without intravenous contrast. COMPARISON:  Head CT 01/11/2020 FINDINGS: Brain: No evidence of acute intracranial hemorrhage or extra-axial collection.No evidence of mass lesion/concern mass effect.The ventricles are unchanged in size.Scattered subcortical and periventricular white matter hypodensities, nonspecific but likely sequela of chronic small vessel ischemic disease.Mild atrophy. 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 Vascular: No hyperdense vessel or unexpected calcification. Skull: Normal. Negative for fracture or focal lesion. Sinuses/Orbits: No acute finding. Other: Right parietal scalp hematoma. IMPRESSION: No acute intracranial abnormality. Right parietal scalp hematoma. Sequela of chronic small vessel ischemic disease. Electronically Signed   By: Caprice Renshaw M.D.   On: 04/09/2021 18:24   CT Cervical Spine Wo Contrast  Result Date: 04/09/2021 CLINICAL DATA:  Polytrauma, critical, head/C-spine injury suspected fall on thinners EXAM: CT CERVICAL SPINE WITHOUT CONTRAST TECHNIQUE: Multidetector CT imaging of the cervical spine was performed without intravenous contrast. Multiplanar CT image reconstructions were also generated. COMPARISON:  Cervical spine CT 01/11/2020. FINDINGS: Alignment: Trace anterolisthesis at C6-C7, unchanged. Skull base and vertebrae: There is no evidence of acute cervical spine fracture. 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 Osteopenia. Soft  tissues and spinal canal: No prevertebral fluid or swelling. No visible canal hematoma. Disc levels: Multilevel degenerative disc disease, most severe at C7-T1. Severe bilateral multilevel facet arthropathy. C1-C2 degenerative changes. Upper chest: Negative. Other: Unchanged hypodense left thyroid nodule which measures 1.7 cm. IMPRESSION: No acute cervical spine fracture. Multilevel degenerative disc disease and facet arthropathy. Unchanged hypodense left thyroid nodule measuring 1.7 cm. If not previously performed, non-emergent thyroid ultrasound is recommended for further evaluation based on the patient's age and comorbidities. Electronically Signed   By: Caprice Renshaw M.D.   On: 04/09/2021 18:32    Procedures .Critical Care Performed by: Glendora Score, MD Authorized by: Glendora Score, MD   Critical care provider statement:    Critical care time (minutes):  80   Critical care was necessary to treat or prevent imminent or life-threatening deterioration of the following conditions:  Trauma   Critical care was time spent personally by me on the following activities:  Blood draw for specimens, ordering and review of laboratory studies, ordering and review of  radiographic studies, review of old charts, examination of patient and re-evaluation of patient's condition .Marland KitchenLaceration Repair  Date/Time: 04/09/2021 11:20 PM Performed by: Glendora Score, MD Authorized by: Glendora Score, MD   Laceration details:    Location:  Scalp   Scalp location:  Occipital   Length (cm):  1 Skin repair:    Repair method:  Tissue adhesive Approximation:    Approximation:  Close Repair type:    Repair type:  Simple Post-procedure details:    Dressing:  Open (no dressing)   Procedure completion:  Tolerated well, no immediate complications   Medications Ordered in ED Medications - No data to display  ED Course  I have reviewed the triage vital signs and the nursing notes.  Pertinent labs & imaging  results that were available during my care of the patient were reviewed by me and considered in my medical decision making (see chart for details).    MDM Rules/Calculators/A&P                           Patient seen emergency department for evaluation of a fall on blood thinners.  Physical exam reveals a hematoma to the occiput with a small laceration with bleeding controlled.  Trauma imaging unremarkable outside of the after mentioned hematoma.  Laceration repaired with Dermabond.  Patient able to ambulate in the emergency department without difficulty and the patient was discharged. Final Clinical Impression(s) / ED Diagnoses Final diagnoses:  Fall    Rx / DC Orders ED Discharge Orders     None        Yatziri Wainwright, Wyn Forster, MD 04/09/21 2322

## 2021-04-09 NOTE — ED Triage Notes (Signed)
Pt arrived to ED via EMS from [facility] as a Level 2 Fall on Thinners. Pt is on Eliquis. Fall was mechanical Pt reports he was using his rollator walker when he fell backwards w/ impact to the posterior of his head. Bleeding controlled and pt in C-collar. Pt HOH, A&Ox4. VSS w/ EMS. Hematoma noted to back of head.

## 2021-04-09 NOTE — ED Notes (Signed)
Pt back from imaging

## 2021-04-09 NOTE — ED Notes (Signed)
Called [facility] to transport patient to [facility]. Per [facility] patient is currently 7th on the list for transport.

## 2021-04-10 DIAGNOSIS — Z743 Need for continuous supervision: Secondary | ICD-10-CM | POA: Diagnosis not present

## 2021-04-10 DIAGNOSIS — I10 Essential (primary) hypertension: Secondary | ICD-10-CM | POA: Diagnosis not present

## 2021-04-10 DIAGNOSIS — W19XXXA Unspecified fall, initial encounter: Secondary | ICD-10-CM | POA: Diagnosis not present

## 2021-04-11 DIAGNOSIS — W19XXXA Unspecified fall, initial encounter: Secondary | ICD-10-CM | POA: Diagnosis not present

## 2021-04-11 DIAGNOSIS — S0091XA Abrasion of unspecified part of head, initial encounter: Secondary | ICD-10-CM | POA: Diagnosis not present

## 2021-05-08 NOTE — Progress Notes (Signed)
Cardiology Office Note   Date:  05/10/2021   ID:  Matthew Calderon, DOB 1932-12-25, MRN [id number]  PCP:  Matthew Brunette, MD  Cardiologist:   Rollene Rotunda, MD Referring:  Matthew Brunette, MD  Chief Complaint  Patient presents with   Congestive Heart Failure       History of Present Illness: Matthew Calderon is a 87 y.o. male who presents for evaluation of chronic HF.  He is new to me.  He previously saw another cardiology group in town.  Looking through the chart in 2019 he was hospitalized with pneumonia.  He was also diagnosed with a non-STEMI and at that time he was found to have an ejection fraction of 25%.  He was managed conservatively and meds titrated.  I reviewed these records for this visit.  Follow-up echocardiograms with the most recent being in 2021 demonstrated EF to be 55%.  There were no significant valvular abnormalities.  He had mild to moderate aortic valve sclerosis but no stenosis.    He is being referred here to establish care with a new cardiologist.  He denies any acute cardiovascular symptoms.  He does not have any chest pressure, neck or arm discomfort.  He does not have any palpitations, presyncope or syncope.  He lives at [facility] in independent living while he is wife apparently is in skilled nursing.  He walks around with his Rollator.  The patient denies any new symptoms such as chest discomfort, neck or arm discomfort. There has been no new shortness of breath, PND or orthopnea. There have been no reported palpitations, presyncope or syncope.     Past Medical History:  Diagnosis Date   Anemia    takes iron pill   Anxiety    Arthritis    Depression    Diabetes mellitus without complication (HCC)    GERD (gastroesophageal reflux disease)    pepto   H/O hiatal hernia    Pneumonia    4 years ago   PONV (postoperative nausea and vomiting) 05/14/2019   Post-nasal drip    Prostate hyperplasia, benign localized, without urinary obstruction    Seasonal  allergies    Sleep apnea    no CPAP    Past Surgical History:  Procedure Laterality Date   BACK SURGERY  1995   discectomy   COLONOSCOPY W/ BIOPSIES AND POLYPECTOMY     EYE SURGERY Bilateral    cataracts   EYE SURGERY Bilateral    glaucoma shunts   JOINT REPLACEMENT Left 1992   knee   JOINT REPLACEMENT Right 1996   knee   JOINT REPLACEMENT Right 2009   hip   JOINT REPLACEMENT Left 2005   hip   KNEE ARTHROSCOPY Left 1981   KNEE ARTHROSCOPY WITH PATELLA RECONSTRUCTION Right 2010   KNEE SURGERY Bilateral 1954   knee surgery and placed in a cast for 6 weeks   KNEE SURGERY  1961   cartilage removed   LEG SURGERY Bilateral 1974, 1975   straighten legs   SHOULDER OPEN ROTATOR CUFF REPAIR Right 2010   TONSILLECTOMY     TOTAL HIP ARTHROPLASTY Right 01/13/2014   Procedure: TOTAL HIP ARTHROPLASTY ANTERIOR APPROACH;  Surgeon: Velna Ochs, MD;  Location: [facility] OR;  Service: Orthopedics;  Laterality: Right;     Current Outpatient Medications  Medication Sig Dispense Refill   aspirin 81 MG chewable tablet Chew 81 mg by mouth daily.     aspirin 81 MG chewable tablet 1 tablet  aspirin EC 81 MG EC tablet Take 1 tablet (81 mg total) by mouth daily.     atorvastatin (LIPITOR) 40 MG tablet Take 1 tablet (40 mg total) by mouth daily at 6 PM. (Patient taking differently: Take 40 mg by mouth every evening.)     atorvastatin (LIPITOR) 40 MG tablet 1 tablet     brimonidine (ALPHAGAN) 0.2 % ophthalmic solution Place 1 drop into both eyes 2 (two) times daily.     carvedilol (COREG) 12.5 MG tablet Take 1 tablet (12.5 mg total) by mouth 2 (two) times daily with a meal.     carvedilol (COREG) 6.25 MG tablet TAKE 1 TABLET BY MOUTH TWICE DAILY *TAKE WITH FOOD*     cetirizine (ZYRTEC) 10 MG tablet 1 tablet     Cholecalciferol (D3-1000) 25 MCG (1000 UT) capsule 2 tablet     Cholecalciferol 25 MCG (1000 UT) tablet Take 2,000 Units by mouth every morning.      Empagliflozin-Linaglip-Metform  (TRIJARDY XR) 25-10-998 MG TB24 1 tablet with the morning meal     famotidine (PEPCID) 20 MG tablet      famotidine (PEPCID) 20 MG tablet 1 tablet at bedtime as needed     Ferrous Sulfate 143 (45 FE) MG TBCR Take 1 tablet by mouth daily.     Ferrous Sulfate Dried 143 (45 Fe) MG TBCR ferrous sulfate ER 143 mg (45 mg iron) tablet,extended release   1 by oral route.     finasteride (PROSCAR) 5 MG tablet Take 5 mg by mouth every evening.      finasteride (PROSCAR) 5 MG tablet 1 tablet     fluticasone (FLONASE) 50 MCG/ACT nasal spray 2 spray in each nostril     furosemide (LASIX) 20 MG tablet Take 20 mg by mouth daily as needed for fluid or edema.     insulin aspart (NOVOLOG) 100 UNIT/ML injection insulin aspart U-100  100 unit/mL subcutaneous solution   0 units by sub-q route.     Melatonin 10 MG TBCR Take 1 tablet by mouth at bedtime.     metFORMIN (GLUCOPHAGE) 500 MG tablet Take 500 mg by mouth every evening.     metFORMIN (GLUCOPHAGE-XR) 500 MG 24 hr tablet      mirabegron ER (MYRBETRIQ) 50 MG TB24 tablet 1 tablet     Multiple Vitamins-Minerals (PRESERVISION AREDS 2 PO) 1 capsule     Multiple Vitamins-Minerals (PRESERVISION AREDS 2) CAPS Take 1 capsule by mouth 2 (two) times daily.      ondansetron (ZOFRAN) 4 MG tablet      potassium chloride (K-DUR,KLOR-CON) 10 MEQ tablet Take 1 tablet (10 mEq total) by mouth daily.     potassium chloride (KLOR-CON) 10 MEQ tablet 1 tablet     RA MELATONIN 10 MG TABS Take 1 tablet by mouth at bedtime.     sacubitril-valsartan (ENTRESTO) 24-26 MG 1     sertraline (ZOLOFT) 50 MG tablet Take 50 mg by mouth every evening.     sertraline (ZOLOFT) 50 MG tablet 0.5 tablet     SYNJARDY XR 25-1000 MG TB24 Take 1 tablet by mouth daily.      tamsulosin (FLOMAX) 0.4 MG CAPS capsule 1 capsule     timolol (TIMOPTIC) 0.5 % ophthalmic solution Place 1 drop into both eyes 2 (two) times daily.     timolol (TIMOPTIC) 0.5 % ophthalmic solution 1 drop into each eye      traMADol (ULTRAM) 50 MG tablet Take 1 tablet (50 mg total) by mouth  every 6 (six) hours as needed. 20 tablet 0   traZODone (DESYREL) 50 MG tablet Take 50 mg by mouth at bedtime.     No current facility-administered medications for this visit.    Allergies:   Alendronate sodium, Penicillin g, Penicillins, Sulfa antibiotics, and Sulfacetamide sodium    Social History:  The patient  reports that he quit smoking about 44 years ago. His smoking use included cigarettes. He has a 18.00 pack-year smoking history. He has never used smokeless tobacco. He reports current alcohol use. He reports that he does not use drugs.   Family History:  The patient's family history includes Arthritis in his father and mother.    ROS:  Please see the history of present illness.   Otherwise, review of systems are positive for balance, falls, back pain.   All other systems are reviewed and negative.    PHYSICAL EXAM: VS:  BP 105/71   Pulse 81   Ht 4\' 11"  (1.499 m)   Wt 153 lb 9.6 oz (69.7 kg)   SpO2 99%   BMI 31.02 kg/m  , BMI Body mass index is 31.02 kg/m. GENERAL:  Well appearing HEENT:  Pupils equal round and reactive, fundi not visualized, oral mucosa unremarkable NECK:  No jugular venous distention, waveform within normal limits, carotid upstroke brisk and symmetric, no bruits, no thyromegaly LYMPHATICS:  No cervical, inguinal adenopathy LUNGS:  Clear to auscultation bilaterally BACK:  No CVA tenderness CHEST:  Unremarkable HEART:  PMI not displaced or sustained,S1 and S2 within normal limits, no S3, no S4, no clicks, no rubs, no murmurs ABD:  Flat, positive bowel sounds normal in frequency in pitch, no bruits, no rebound, no guarding, no midline pulsatile mass, no hepatomegaly, no splenomegaly EXT:  2 plus pulses throughout, no edema, no cyanosis no clubbing SKIN:  No rashes no nodules NEURO:  Cranial nerves II through XII grossly intact, motor grossly intact throughout PSYCH:  Cognitively intact,  oriented to person place and time    EKG:  EKG is ordered today. The ekg ordered today demonstrates sinus rhythm, rate 81, left axis deviation, nonspecific anterior T wave changes   Recent Labs: 04/09/2021: ALT 13; BUN 31; Creatinine, Ser 1.20; Hemoglobin 14.6; Platelets 177; Potassium 4.8; Sodium 141    Lipid Panel    Component Value Date/Time   CHOL 153 06/26/2018 0439   TRIG 126 06/26/2018 0439   HDL 41 06/26/2018 0439   CHOLHDL 3.7 06/26/2018 0439   VLDL 25 06/26/2018 0439   LDLCALC 87 06/26/2018 0439      Wt Readings from Last 3 Encounters:  05/10/21 153 lb 9.6 oz (69.7 kg)  04/09/21 150 lb (68 kg)  07/21/18 158 lb 9.6 oz (71.9 kg)      Other studies Reviewed: Additional studies/ records that were reviewed today include: Hospital records, primary care office notes Review of the above records demonstrates:  Please see elsewhere in the note.     ASSESSMENT AND PLAN:  CHRONIC HF: The patient had previous systolic heart failure but more recently a preserved ejection fraction.  He seems to be euvolemic.  He is on a good medical regimen and I do not see an indication to titrate further.  No further imaging is indicated.  DM: His most recent A1c appears to be 6.9.  Continue current therapy.  FALLS: There was a question of syncope previously.  However, it seems like he has had falls but he does not report any syncope.  No further work-up.  Current medicines are reviewed at length with the patient today.  The patient does not have concerns regarding medicines.  The following changes have been made:  no change  Labs/ tests ordered today include:   Orders Placed This Encounter  Procedures   EKG 12-Lead      Disposition:   FU with me in one year.     Signed, Rollene Rotunda, MD  05/10/2021 12:27 PM    [facility]

## 2021-05-10 ENCOUNTER — Ambulatory Visit (INDEPENDENT_AMBULATORY_CARE_PROVIDER_SITE_OTHER): Payer: Medicare PPO | Admitting: Cardiology

## 2021-05-10 ENCOUNTER — Other Ambulatory Visit: Payer: Self-pay

## 2021-05-10 ENCOUNTER — Encounter: Payer: Self-pay | Admitting: Cardiology

## 2021-05-10 VITALS — BP 105/71 | HR 81 | Ht 59.0 in | Wt 153.6 lb

## 2021-05-10 DIAGNOSIS — I5032 Chronic diastolic (congestive) heart failure: Secondary | ICD-10-CM | POA: Diagnosis not present

## 2021-05-10 DIAGNOSIS — I10 Essential (primary) hypertension: Secondary | ICD-10-CM

## 2021-05-10 DIAGNOSIS — I214 Non-ST elevation (NSTEMI) myocardial infarction: Secondary | ICD-10-CM | POA: Diagnosis not present

## 2021-05-10 NOTE — Patient Instructions (Signed)

## 2021-06-27 ENCOUNTER — Ambulatory Visit (INDEPENDENT_AMBULATORY_CARE_PROVIDER_SITE_OTHER): Payer: Medicare PPO | Admitting: Podiatry

## 2021-06-27 ENCOUNTER — Other Ambulatory Visit: Payer: Self-pay

## 2021-06-27 DIAGNOSIS — B351 Tinea unguium: Secondary | ICD-10-CM

## 2021-06-27 DIAGNOSIS — M2012 Hallux valgus (acquired), left foot: Secondary | ICD-10-CM | POA: Diagnosis not present

## 2021-06-27 DIAGNOSIS — Z794 Long term (current) use of insulin: Secondary | ICD-10-CM | POA: Diagnosis not present

## 2021-06-27 DIAGNOSIS — M2011 Hallux valgus (acquired), right foot: Secondary | ICD-10-CM | POA: Diagnosis not present

## 2021-06-27 DIAGNOSIS — M79675 Pain in left toe(s): Secondary | ICD-10-CM

## 2021-06-27 DIAGNOSIS — M79674 Pain in right toe(s): Secondary | ICD-10-CM | POA: Diagnosis not present

## 2021-06-27 DIAGNOSIS — E119 Type 2 diabetes mellitus without complications: Secondary | ICD-10-CM

## 2021-06-27 NOTE — Progress Notes (Signed)
ANNUAL DIABETIC FOOT EXAM  Subjective: Matthew Calderon presents today for for annual diabetic foot examination.  Patient relates 10-15 year h/o diabetes.  Patient denies any h/o foot wounds.  Patient denies any numbness, tingling, burning, or pins/needle sensation in feet.  Patient does not monitor blood glucose daily.  Matthew Pretty, MD is patient's PCP. Last visit was 02/28/2021.  Past Medical History:  Diagnosis Date   Anemia    takes iron pill   Anxiety    Arthritis    Depression    Diabetes mellitus without complication (HCC)    GERD (gastroesophageal reflux disease)    pepto   H/O hiatal hernia    Pneumonia    4 years ago   PONV (postoperative nausea and vomiting) 05/14/2019   Post-nasal drip    Prostate hyperplasia, benign localized, without urinary obstruction    Seasonal allergies    Sleep apnea    no CPAP   Patient Active Problem List   Diagnosis Date Noted   Age-related osteoporosis without current pathological fracture 03/17/2021   Penicillin allergy 03/17/2021   Androgen deficiency 03/17/2021   Disorder of lipid metabolism 03/17/2021   Diverticular disease of colon 03/17/2021   Drowsiness 03/17/2021   Encounter for general adult medical examination with abnormal findings 03/17/2021   Essential hypertension 03/17/2021   Gout 03/17/2021   History of psychiatric disorder 03/17/2021   Hypocalcemia 03/17/2021   Intertrigo 03/17/2021   Iron deficiency anemia 03/17/2021   Macular degeneration 03/17/2021   Peripheral arterial disease ([place]) 03/17/2021   Physical deconditioning 03/17/2021   Scoliosis 03/17/2021   Unequal leg length 03/17/2021   Vitamin B12 deficiency 03/17/2021   Vitamin D deficiency 03/17/2021   Macular pucker of both eyes 11/07/2020   Retinal edema 11/07/2020   Fall 01/14/2020   Chronic diastolic CHF (congestive heart failure) ([place]) 01/12/2020   Multiple rib fractures 01/11/2020   Dysequilibrium 01/11/2020   Fall at home, initial  encounter 01/11/2020   Head trauma, subsequent encounter 01/11/2020   Arthritis 05/14/2019   Enlarged prostate 05/14/2019   History of tobacco abuse 05/14/2019   Interstitial cystitis 05/14/2019   Obesity 05/14/2019   OSA (obstructive sleep apnea) 05/14/2019   PONV (postoperative nausea and vomiting) 05/14/2019   Post-nasal drip 05/14/2019   Seasonal allergies 05/14/2019   Chronic cough 07/21/2018   GERD (gastroesophageal reflux disease) 07/21/2018   NSTEMI (non-ST elevated myocardial infarction) ([place]) 06/20/2018   Prostate hyperplasia, benign localized, without urinary obstruction    Hyperglycemia    Syncope, cardiogenic    Degenerative joint disease (DJD) of hip 01/13/2014   Diabetes ([place]) 01/13/2014   Pseudophakia, both eyes 12/01/2013   Nonexudative age-related macular degeneration 01/16/2013   Primary open angle glaucoma of right eye, severe stage 01/13/2013   Allergic rhinitis 09/17/2010   Anemia, unspecified 09/17/2010   Gastro-esophageal reflux disease with esophagitis 12/27/2009   Other and unspecified hyperlipidemia 12/27/2009   Hypertrophy of prostate without urinary obstruction and other lower urinary tract symptoms (LUTS) 07/11/2009   Insomnia 07/11/2009   Osteoporosis 07/11/2009   Other malaise and fatigue 06/15/2008   Depressive disorder, not elsewhere classified 05/04/2008   Unspecified glaucoma 05/04/2008   Past Surgical History:  Procedure Laterality Date   BACK SURGERY  1995   discectomy   COLONOSCOPY W/ BIOPSIES AND POLYPECTOMY     EYE SURGERY Bilateral    cataracts   EYE SURGERY Bilateral    glaucoma shunts   JOINT REPLACEMENT Left 1992   knee   JOINT REPLACEMENT Right  1996   knee   JOINT REPLACEMENT Right 2009   hip   JOINT REPLACEMENT Left 2005   hip   KNEE ARTHROSCOPY Left 1981   KNEE ARTHROSCOPY WITH PATELLA RECONSTRUCTION Right 2010   KNEE SURGERY Bilateral 1954   knee surgery and placed in a cast for 6 weeks   KNEE SURGERY  1961    cartilage removed   LEG SURGERY Bilateral 1974, 1975   straighten legs   SHOULDER OPEN ROTATOR CUFF REPAIR Right 2010   TONSILLECTOMY     TOTAL HIP ARTHROPLASTY Right 01/13/2014   Procedure: TOTAL HIP ARTHROPLASTY ANTERIOR APPROACH;  Surgeon: Hessie Dibble, MD;  Location: [facility];  Service: Orthopedics;  Laterality: Right;    Allergies  Allergen Reactions   Alendronate Sodium     Other reaction(s): diarrhea   Penicillin G     Other reaction(s): Unknown   Penicillins Hives    DID THE REACTION INVOLVE: Swelling of the face/tongue/throat, SOB, or low BP? No Sudden or severe rash/hives, skin peeling, or the inside of the mouth or nose? No Did it require medical treatment? No When did it last happen?childhood allergy       If all above answers are NO, may proceed with cephalosporin use.    Sulfa Antibiotics Hives   Sulfacetamide Sodium     Other reaction(s): Unknown   Social History   Occupational History   Not on file  Tobacco Use   Smoking status: Former    Packs/day: 1.00    Years: 18.00    Pack years: 18.00    Types: Cigarettes    Quit date: 1978    Years since quitting: 45.0   Smokeless tobacco: Never   Tobacco comments:    stopped 45 years ago  Vaping Use   Vaping Use: Never used  Substance and Sexual Activity   Alcohol use: Yes    Comment: social   Drug use: No   Sexual activity: Not on file   Family History  Problem Relation Age of Onset   Arthritis Mother    Arthritis Father    Immunization History  Administered Date(s) Administered   Influenza, High Dose Seasonal PF 04/15/2018   Tdap 04/15/2018   Zoster Recombinat (Shingrix) 02/16/2019     Review of Systems: Negative except as noted in the HPI.   Objective: There were no vitals filed for this visit.  Matthew Calderon is a pleasant 88 y.o. male in NAD. AAO X 3.  Vascular Examination: CFT <3 seconds b/l LE. Palpable DP/PT pulses b/l LE. Digital hair sparse b/l. Skin temperature gradient WNL b/l.  No pain with calf compression b/l. No edema noted b/l. No cyanosis or clubbing noted b/l LE.  Dermatological Examination: Pedal integument with normal turgor, texture and tone b/l LE. No open wounds b/l. No interdigital macerations b/l. Toenails 1-5 b/l elongated, thickened, discolored with subungual debris. +Tenderness with dorsal palpation of nailplates. No hyperkeratotic or porokeratotic lesions present.  Musculoskeletal Examination: Muscle strength 5/5 to all lower extremity muscle groups bilaterally. No pain, crepitus or joint limitation noted with ROM bilateral LE. HAV with bunion deformity noted b/l LE. Utilizes rollator for ambulation assistance.  Footwear Assessment: Does the patient wear appropriate shoes? Yes. Does the patient need inserts/orthotics? No.  Neurological Examination: Protective sensation intact 5/5 intact bilaterally with 10g monofilament b/l. Vibratory sensation intact b/l. Proprioception intact bilaterally.  Assessment: 1. Pain due to onychomycosis of toenails of both feet   2. Hallux valgus, acquired, bilateral  3. Type 2 diabetes mellitus without complication, with long-term current use of insulin ([place])   4. Encounter for diabetic foot exam ([place])     ADA Risk Categorization: Low Risk :  Patient has all of the following: Intact protective sensation No prior foot ulcer  No severe deformity Pedal pulses present  Plan: -Diabetic foot examination performed today. -Continue foot and shoe inspections daily. Monitor blood glucose per PCP/Endocrinologist's recommendations. -Mycotic toenails 1-5 bilaterally were debrided in length and girth with sterile nail nippers and dremel without incident. -Patient/POA to call should there be question/concern in the interim.  Return in about 3 months (around 09/25/2021).  Marzetta Board, DPM

## 2021-07-01 ENCOUNTER — Encounter: Payer: Self-pay | Admitting: Podiatry

## 2021-08-16 DIAGNOSIS — H401133 Primary open-angle glaucoma, bilateral, severe stage: Secondary | ICD-10-CM | POA: Diagnosis not present

## 2021-09-03 DIAGNOSIS — Z125 Encounter for screening for malignant neoplasm of prostate: Secondary | ICD-10-CM | POA: Diagnosis not present

## 2021-09-03 DIAGNOSIS — E119 Type 2 diabetes mellitus without complications: Secondary | ICD-10-CM | POA: Diagnosis not present

## 2021-09-03 DIAGNOSIS — R4182 Altered mental status, unspecified: Secondary | ICD-10-CM | POA: Diagnosis not present

## 2021-09-03 DIAGNOSIS — F5101 Primary insomnia: Secondary | ICD-10-CM | POA: Diagnosis not present

## 2021-09-03 DIAGNOSIS — E538 Deficiency of other specified B group vitamins: Secondary | ICD-10-CM | POA: Diagnosis not present

## 2021-09-03 DIAGNOSIS — I10 Essential (primary) hypertension: Secondary | ICD-10-CM | POA: Diagnosis not present

## 2021-09-03 DIAGNOSIS — D649 Anemia, unspecified: Secondary | ICD-10-CM | POA: Diagnosis not present

## 2021-09-03 DIAGNOSIS — E559 Vitamin D deficiency, unspecified: Secondary | ICD-10-CM | POA: Diagnosis not present

## 2021-09-03 DIAGNOSIS — E789 Disorder of lipoprotein metabolism, unspecified: Secondary | ICD-10-CM | POA: Diagnosis not present

## 2021-09-03 DIAGNOSIS — Z0000 Encounter for general adult medical examination without abnormal findings: Secondary | ICD-10-CM | POA: Diagnosis not present

## 2021-09-10 DIAGNOSIS — E118 Type 2 diabetes mellitus with unspecified complications: Secondary | ICD-10-CM | POA: Diagnosis not present

## 2021-09-10 DIAGNOSIS — Z0000 Encounter for general adult medical examination without abnormal findings: Secondary | ICD-10-CM | POA: Diagnosis not present

## 2021-09-10 DIAGNOSIS — E538 Deficiency of other specified B group vitamins: Secondary | ICD-10-CM | POA: Diagnosis not present

## 2021-09-10 DIAGNOSIS — I10 Essential (primary) hypertension: Secondary | ICD-10-CM | POA: Diagnosis not present

## 2021-09-10 DIAGNOSIS — N1831 Chronic kidney disease, stage 3a: Secondary | ICD-10-CM | POA: Diagnosis not present

## 2021-09-17 DIAGNOSIS — M810 Age-related osteoporosis without current pathological fracture: Secondary | ICD-10-CM | POA: Diagnosis not present

## 2021-10-03 ENCOUNTER — Ambulatory Visit (INDEPENDENT_AMBULATORY_CARE_PROVIDER_SITE_OTHER): Payer: Medicare PPO | Admitting: Podiatry

## 2021-10-03 DIAGNOSIS — M79675 Pain in left toe(s): Secondary | ICD-10-CM

## 2021-10-03 DIAGNOSIS — E119 Type 2 diabetes mellitus without complications: Secondary | ICD-10-CM | POA: Diagnosis not present

## 2021-10-03 DIAGNOSIS — B351 Tinea unguium: Secondary | ICD-10-CM

## 2021-10-03 DIAGNOSIS — Z794 Long term (current) use of insulin: Secondary | ICD-10-CM

## 2021-10-03 DIAGNOSIS — M79674 Pain in right toe(s): Secondary | ICD-10-CM | POA: Diagnosis not present

## 2021-10-13 NOTE — Progress Notes (Signed)
?  Subjective:  ?Patient ID: Matthew Calderon, male    DOB: [birth year],  MRN: [id number] ? ?Matthew Calderon presents to clinic today for preventative diabetic foot care and painful elongated mycotic toenails 1-5 bilaterally which are tender when wearing enclosed shoe gear. Pain is relieved with periodic professional debridement. ? ?Patient did not check blood glucose today. He is not aware of last A1c. ? ?New problem(s):  ingrown toenail to the right great toe. Patient states it has become uncomfortable in his shoes. He denies any redness,drainage or swelling. ? ?PCP is Merri Brunette, MD , and last visit was February, 2023. ? ?Allergies  ?Allergen Reactions  ? Alendronate Sodium   ?  Other reaction(s): diarrhea  ? Penicillin G   ?  Other reaction(s): Unknown  ? Penicillins Hives  ?  DID THE REACTION INVOLVE: Swelling of the face/tongue/throat, SOB, or low BP? No ?Sudden or severe rash/hives, skin peeling, or the inside of the mouth or nose? No ?Did it require medical treatment? No ?When did it last happen?childhood allergy       ?If all above answers are ?NO?, may proceed with cephalosporin use. ?  ? Sulfa Antibiotics Hives  ? Sulfacetamide Sodium   ?  Other reaction(s): Unknown  ? ? ?Review of Systems: Negative except as noted in the HPI. ? ?Objective:  ?Matthew Calderon is a pleasant 88 y.o. male in NAD. AAO X 3. ? ?Vascular Examination: ?CFT <3 seconds b/l LE. Palpable DP/PT pulses b/l LE. Digital hair sparse b/l. Skin temperature gradient WNL b/l. No pain with calf compression b/l. No edema noted b/l. No cyanosis or clubbing noted b/l LE. ? ?Dermatological Examination: ?Pedal integument with normal turgor, texture and tone b/l LE. No open wounds b/l. No interdigital macerations b/l. Toenails 1-5 b/l elongated, thickened, discolored with subungual debris. +Tenderness with dorsal palpation of nailplates. No hyperkeratotic or porokeratotic lesions present. Incurvated nailplate right great toe medial border(s) with  tenderness to palpation. No erythema, no edema, no drainage noted.  ? ?Musculoskeletal Examination: ?Muscle strength 5/5 to all lower extremity muscle groups bilaterally. No pain, crepitus or joint limitation noted with ROM bilateral LE. HAV with bunion deformity noted b/l LE. Utilizes rollator for ambulation assistance. ? ?Neurological Examination: ?Protective sensation intact 5/5 intact bilaterally with 10g monofilament b/l. Vibratory sensation intact b/l. Proprioception intact bilaterally. ? ?Assessment/Plan: ?1. Pain due to onychomycosis of toenails of both feet   ?2. Type 2 diabetes mellitus without complication, with long-term current use of insulin (HCC)   ?-Patient was evaluated and treated. All patient's and/or POA's questions/concerns answered on today's visit. ?-Continue foot and shoe inspections daily. Monitor blood glucose per PCP/Endocrinologist's recommendations. ?-Toenails 1-5 b/l were debrided in length and girth with sterile nail nippers and dremel without iatrogenic bleeding.  ?-Offending nail border debrided and curretaged medial border right hallux utilizing sterile nail nipper and currette. Border cleansed with alcohol and triple antibiotic applied. No further treatment required by patient/caregiver. ?-Patient/POA to call should there be question/concern in the interim.  ? ?Return in about 3 months (around 01/02/2022). ? ?[name], DPM  ?

## 2021-10-22 DIAGNOSIS — E11319 Type 2 diabetes mellitus with unspecified diabetic retinopathy without macular edema: Secondary | ICD-10-CM | POA: Diagnosis not present

## 2021-10-22 DIAGNOSIS — H35033 Hypertensive retinopathy, bilateral: Secondary | ICD-10-CM | POA: Diagnosis not present

## 2021-10-22 DIAGNOSIS — H401132 Primary open-angle glaucoma, bilateral, moderate stage: Secondary | ICD-10-CM | POA: Diagnosis not present

## 2021-10-22 DIAGNOSIS — H52223 Regular astigmatism, bilateral: Secondary | ICD-10-CM | POA: Diagnosis not present

## 2021-10-22 DIAGNOSIS — Z961 Presence of intraocular lens: Secondary | ICD-10-CM | POA: Diagnosis not present

## 2021-10-22 DIAGNOSIS — H524 Presbyopia: Secondary | ICD-10-CM | POA: Diagnosis not present

## 2021-10-22 DIAGNOSIS — H3530 Unspecified macular degeneration: Secondary | ICD-10-CM | POA: Diagnosis not present

## 2021-10-22 DIAGNOSIS — I10 Essential (primary) hypertension: Secondary | ICD-10-CM | POA: Diagnosis not present

## 2021-10-22 DIAGNOSIS — H353132 Nonexudative age-related macular degeneration, bilateral, intermediate dry stage: Secondary | ICD-10-CM | POA: Diagnosis not present

## 2021-11-15 DIAGNOSIS — L03012 Cellulitis of left finger: Secondary | ICD-10-CM | POA: Diagnosis not present

## 2022-01-02 IMAGING — CT CT CERVICAL SPINE W/O CM
4 series · 15 of 33 positions shown, 18 images · non-contrast
Comparison: Cervical spine CT 01/11/2020.

CLINICAL DATA: Polytrauma, critical, head/C-spine injury suspected
fall on thinners

EXAM:
CT CERVICAL SPINE WITHOUT CONTRAST
TECHNIQUE: Multidetector CT imaging of the cervical spine was performed without
intravenous contrast. Multiplanar CT image reconstructions were also
generated.

[Series 4: c_spine 2.0 st · axial · 0.33mm/px · z∈[-251,-135]mm · 5 of 88 slices shown, 7 images]
[im 15/88  soft-tissue]
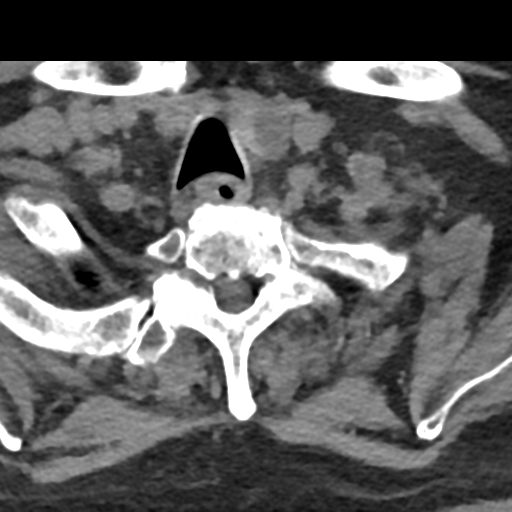
[im 15/88  bone]
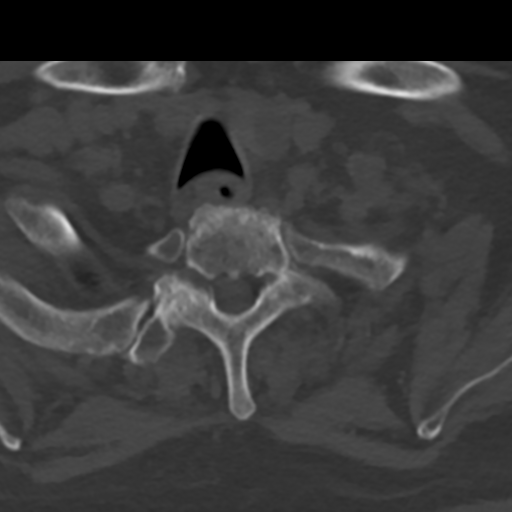
[im 30/88  bone]
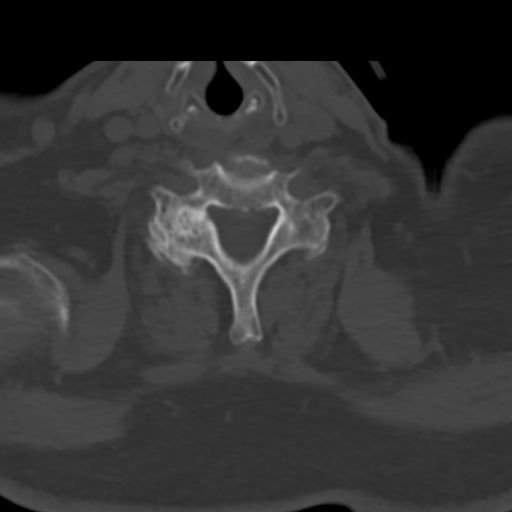
[im 44/88  bone]
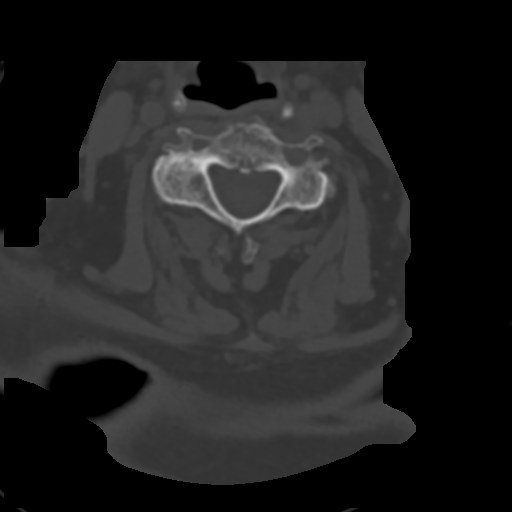
[im 59/88  bone]
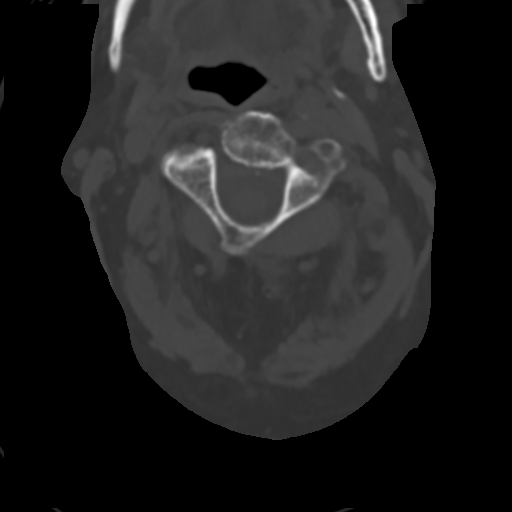
[im 73/88  soft-tissue]
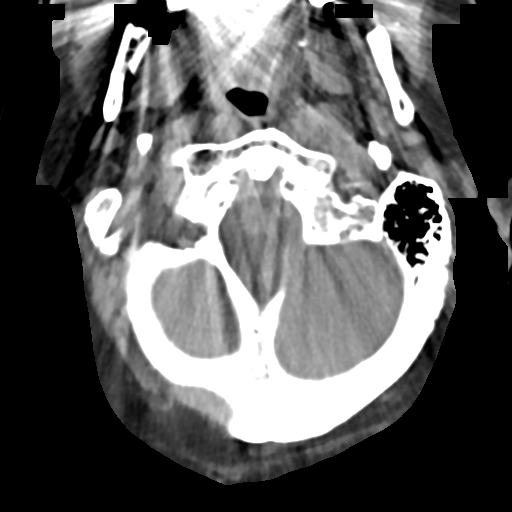
[im 73/88  bone]
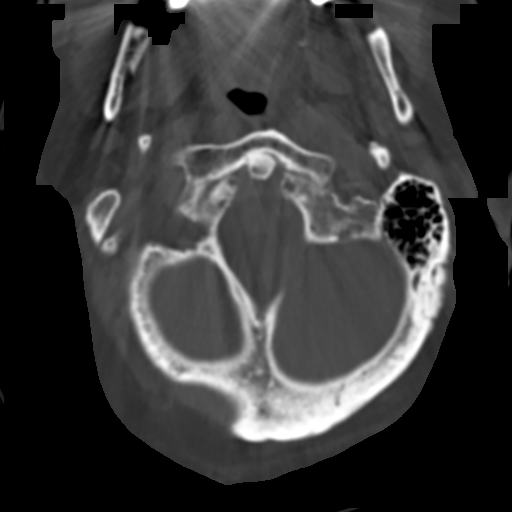

[Series 7: coronal bone · coronal · 0.26mm/px · 3 of 75 slices shown]
[im 15/75  bone]
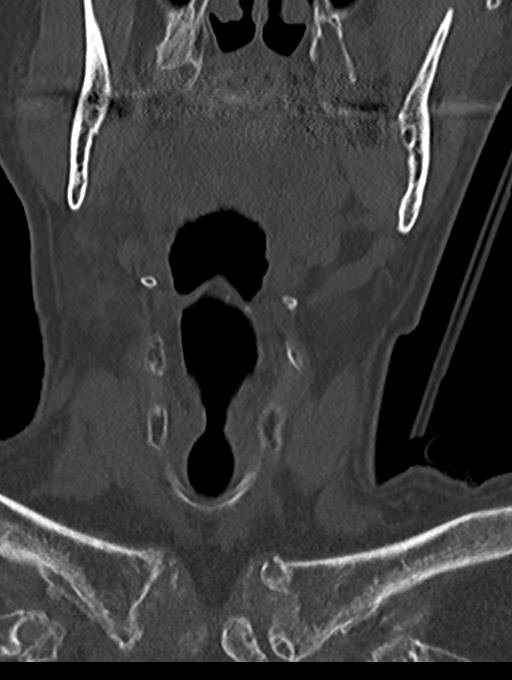
[im 30/75  bone]
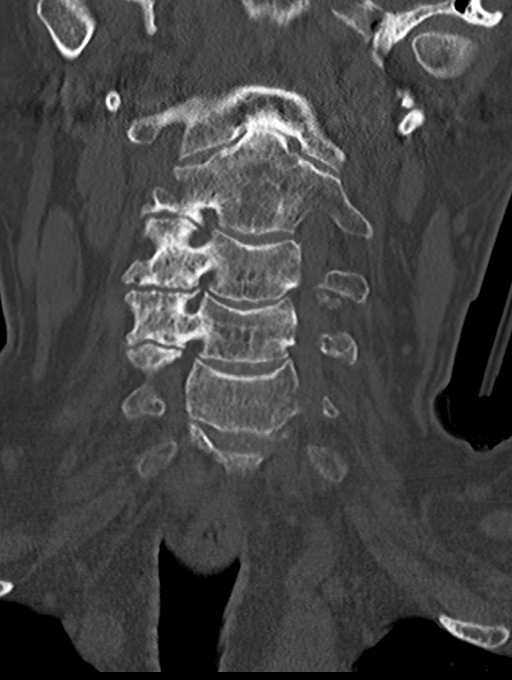
[im 45/75  bone]
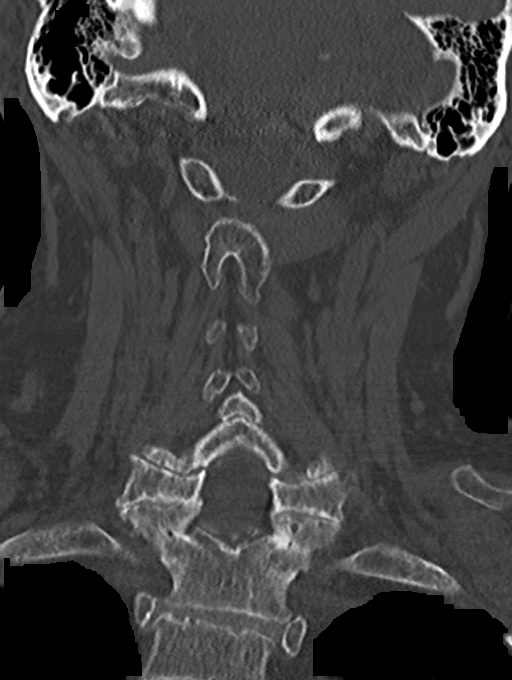

[Series 8: sagittal bone · sagittal · 0.31mm/px · 5 of 61 slices shown, 6 images]
[im 21/61  bone]
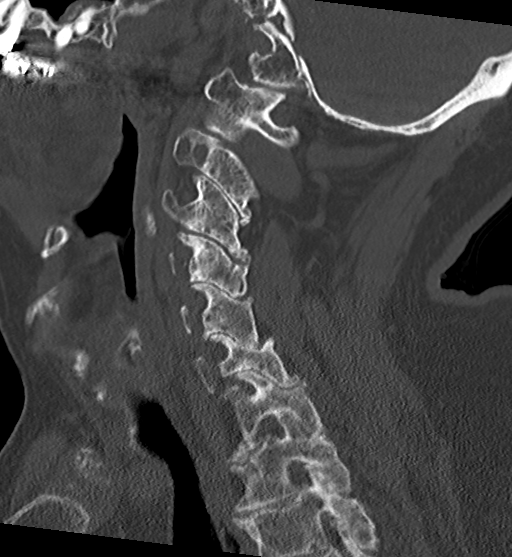
[im 26/61  bone]
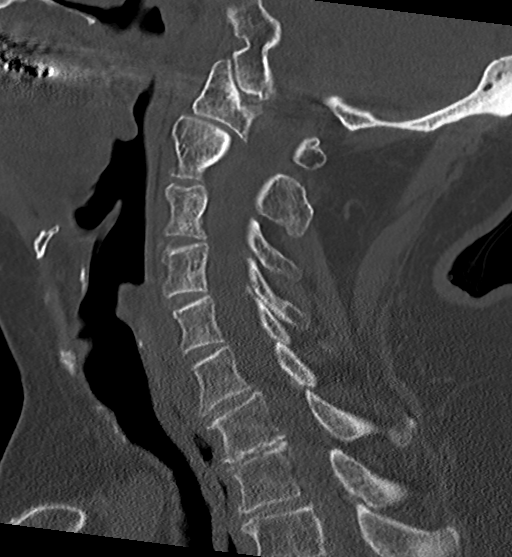
[im 31/61  soft-tissue]
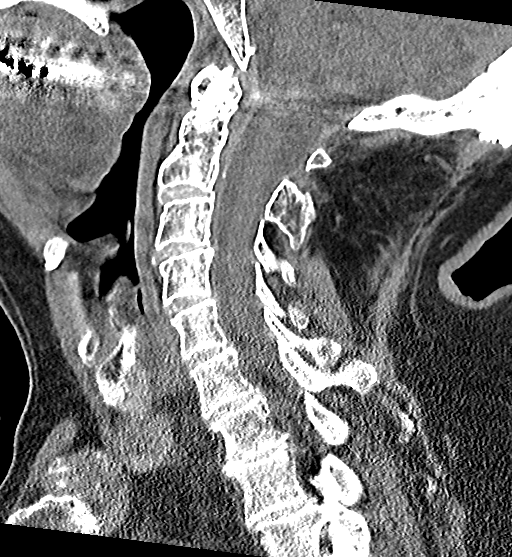
[im 31/61  bone]
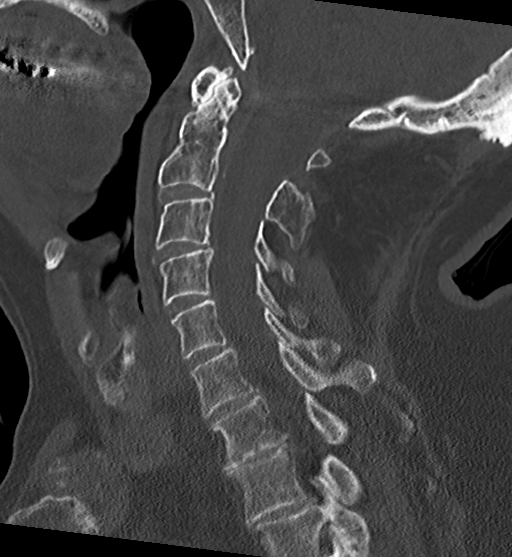
[im 36/61  bone]
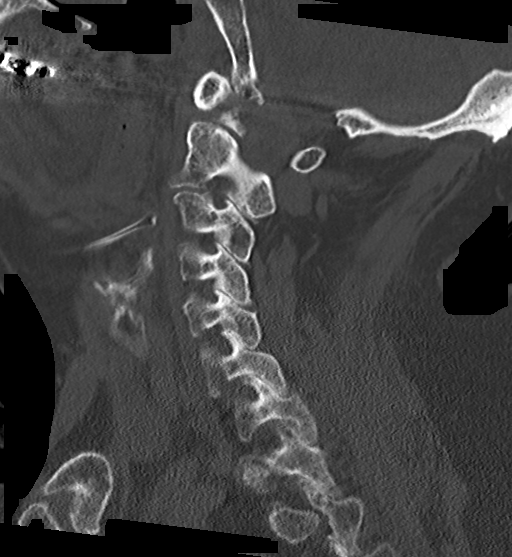
[im 41/61  bone]
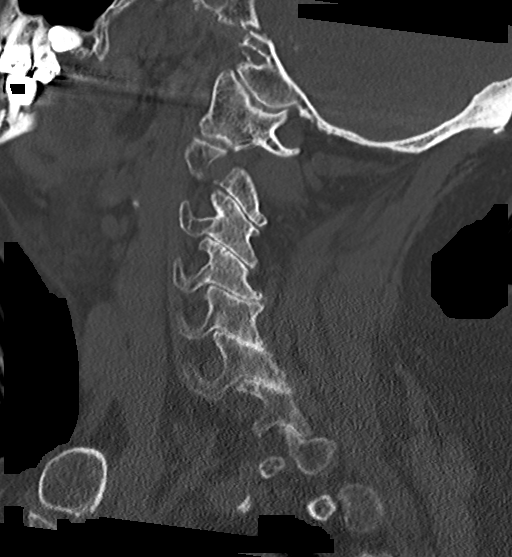

[Series 11: orthogonal axial st · axial · 0.21mm/px · z∈[-265,-244]mm · 2 of 90 slices shown]
[im 15/90  bone]
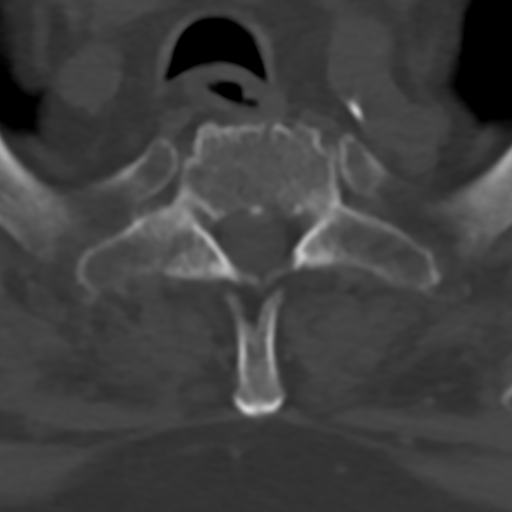
[im 30/90  bone]
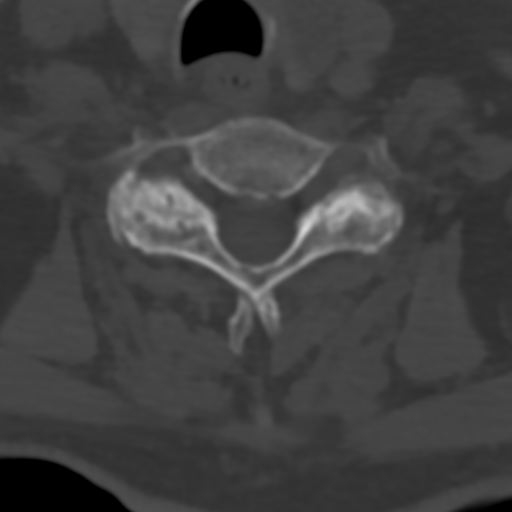

[15 of 33 positions shown; findings below may reference images not displayed]

FINDINGS: Alignment: Trace anterolisthesis at C6-C7, unchanged.

Skull base and vertebrae: There is no evidence of acute cervical
spine fracture. Congenital nonfusion of the posterior arch of C1.
Osteopenia.

Soft tissues and spinal canal: No prevertebral fluid or swelling. No
visible canal hematoma.

Disc levels: Multilevel degenerative disc disease, most severe at
C7-T1. Severe bilateral multilevel facet arthropathy. C1-C2
degenerative changes.

Upper chest: Negative.

Other: Unchanged hypodense left thyroid nodule which measures
cm.
IMPRESSION: No acute cervical spine fracture. Multilevel degenerative disc
disease and facet arthropathy.

Unchanged hypodense left thyroid nodule measuring 1.7 cm. If not
previously performed, non-emergent thyroid ultrasound is recommended
for further evaluation based on the patient's age and comorbidities.

## 2022-01-09 ENCOUNTER — Ambulatory Visit (INDEPENDENT_AMBULATORY_CARE_PROVIDER_SITE_OTHER): Payer: Medicare PPO | Admitting: Podiatry

## 2022-01-09 ENCOUNTER — Encounter: Payer: Self-pay | Admitting: Podiatry

## 2022-01-09 DIAGNOSIS — Z794 Long term (current) use of insulin: Secondary | ICD-10-CM | POA: Diagnosis not present

## 2022-01-09 DIAGNOSIS — E119 Type 2 diabetes mellitus without complications: Secondary | ICD-10-CM

## 2022-01-09 DIAGNOSIS — L600 Ingrowing nail: Secondary | ICD-10-CM

## 2022-01-09 DIAGNOSIS — M79674 Pain in right toe(s): Secondary | ICD-10-CM

## 2022-01-09 DIAGNOSIS — B351 Tinea unguium: Secondary | ICD-10-CM | POA: Diagnosis not present

## 2022-01-09 DIAGNOSIS — M79675 Pain in left toe(s): Secondary | ICD-10-CM | POA: Diagnosis not present

## 2022-01-09 NOTE — Progress Notes (Signed)
  Subjective:  Patient ID: Matthew Calderon, male    DOB: 05-09-1933,  MRN: [id number]  Matthew Calderon presents to clinic today for preventative diabetic foot care and painful elongated mycotic toenails 1-5 bilaterally which are tender when wearing enclosed shoe gear. Pain is relieved with periodic professional debridement.  Last known HgA1c was unknown.  Patient does not monitor blood glucose daily.  New problem(s): None.   PCP is Merri Brunette, MD , and last visit was  3-4 months ago.  Allergies  Allergen Reactions   Alendronate Sodium     Other reaction(s): diarrhea   Penicillin G     Other reaction(s): Unknown   Penicillins Hives    DID THE REACTION INVOLVE: Swelling of the face/tongue/throat, SOB, or low BP? No Sudden or severe rash/hives, skin peeling, or the inside of the mouth or nose? No Did it require medical treatment? No When did it last happen?childhood allergy       If all above answers are "NO", may proceed with cephalosporin use.    Sulfa Antibiotics Hives   Sulfacetamide Sodium     Other reaction(s): Unknown    Review of Systems: Negative except as noted in the HPI.  Objective: No changes noted in today's physical examination. Matthew Calderon is a pleasant 88 y.o. male in NAD. AAO X 3.  Vascular Examination: CFT <3 seconds b/l LE. Palpable DP/PT pulses b/l LE. Digital hair sparse b/l. Skin temperature gradient WNL b/l. No pain with calf compression b/l. No edema noted b/l. No cyanosis or clubbing noted b/l LE.  Dermatological Examination: Pedal integument with normal turgor, texture and tone b/l LE. No open wounds b/l. No interdigital macerations b/l. Toenails 1-5 b/l elongated, thickened, discolored with subungual debris. +Tenderness with dorsal palpation of nailplates. No hyperkeratotic or porokeratotic lesions present. Incurvated nailplate right great toe medial border(s) with tenderness to palpation. No erythema, no edema, no drainage noted.   Musculoskeletal  Examination: Muscle strength 5/5 to all lower extremity muscle groups bilaterally. No pain, crepitus or joint limitation noted with ROM bilateral LE. HAV with bunion deformity noted b/l LE. Utilizes rollator for ambulation assistance.  Neurological Examination: Protective sensation intact 5/5 intact bilaterally with 10g monofilament b/l. Vibratory sensation intact b/l. Proprioception intact bilaterally.  Assessment/Plan: 1. Pain due to onychomycosis of toenails of both feet   2. Ingrown toenail without infection   3. Type 2 diabetes mellitus without complication, with long-term current use of insulin (HCC)     -Examined patient. -Stressed the importance of good glycemic control and the detriment of not  controlling glucose levels in relation to the foot. -Patient to continue soft, supportive shoe gear daily. -Mycotic toenails 1-5 bilaterally were debrided in length and girth with sterile nail nippers and dremel without incident. -Offending nail border debrided and curretaged medial border right hallux utilizing sterile nail nipper and currette. Border(s) cleansed with alcohol and TAO applied. Patient/POA/Caregiver/Facility instructed to apply Neosporin Cream  to right great toe once daily for 7 days. Call office if there are any concerns. -Patient/POA to call should there be question/concern in the interim.   Return in about 3 months (around 04/11/2022).  [name], DPM

## 2022-02-14 DIAGNOSIS — E1169 Type 2 diabetes mellitus with other specified complication: Secondary | ICD-10-CM | POA: Diagnosis not present

## 2022-02-14 DIAGNOSIS — G4733 Obstructive sleep apnea (adult) (pediatric): Secondary | ICD-10-CM | POA: Diagnosis not present

## 2022-02-14 DIAGNOSIS — R531 Weakness: Secondary | ICD-10-CM | POA: Diagnosis not present

## 2022-02-14 DIAGNOSIS — I10 Essential (primary) hypertension: Secondary | ICD-10-CM | POA: Diagnosis not present

## 2022-02-18 DIAGNOSIS — L602 Onychogryphosis: Secondary | ICD-10-CM | POA: Diagnosis not present

## 2022-02-18 DIAGNOSIS — L601 Onycholysis: Secondary | ICD-10-CM | POA: Diagnosis not present

## 2022-02-20 DIAGNOSIS — E118 Type 2 diabetes mellitus with unspecified complications: Secondary | ICD-10-CM | POA: Diagnosis not present

## 2022-02-20 DIAGNOSIS — I10 Essential (primary) hypertension: Secondary | ICD-10-CM | POA: Diagnosis not present

## 2022-02-20 DIAGNOSIS — E785 Hyperlipidemia, unspecified: Secondary | ICD-10-CM | POA: Diagnosis not present

## 2022-02-20 DIAGNOSIS — B3789 Other sites of candidiasis: Secondary | ICD-10-CM | POA: Diagnosis not present

## 2022-03-01 DIAGNOSIS — R051 Acute cough: Secondary | ICD-10-CM | POA: Diagnosis not present

## 2022-03-01 DIAGNOSIS — R0989 Other specified symptoms and signs involving the circulatory and respiratory systems: Secondary | ICD-10-CM | POA: Diagnosis not present

## 2022-03-01 DIAGNOSIS — H938X3 Other specified disorders of ear, bilateral: Secondary | ICD-10-CM | POA: Diagnosis not present

## 2022-03-21 DIAGNOSIS — M810 Age-related osteoporosis without current pathological fracture: Secondary | ICD-10-CM | POA: Diagnosis not present

## 2022-04-09 ENCOUNTER — Telehealth: Payer: Self-pay | Admitting: Podiatry

## 2022-04-09 ENCOUNTER — Encounter: Payer: Self-pay | Admitting: Podiatry

## 2022-04-09 NOTE — Telephone Encounter (Signed)
lvm to cb & r/s appt for 10-27, 30 or 31 [place] o[address]  

## 2022-04-17 ENCOUNTER — Ambulatory Visit: Payer: Medicare PPO | Admitting: Podiatry

## 2022-04-23 ENCOUNTER — Ambulatory Visit (INDEPENDENT_AMBULATORY_CARE_PROVIDER_SITE_OTHER): Payer: Medicare PPO | Admitting: Podiatry

## 2022-04-23 ENCOUNTER — Encounter: Payer: Self-pay | Admitting: Podiatry

## 2022-04-23 DIAGNOSIS — E119 Type 2 diabetes mellitus without complications: Secondary | ICD-10-CM

## 2022-04-23 DIAGNOSIS — L600 Ingrowing nail: Secondary | ICD-10-CM

## 2022-04-23 DIAGNOSIS — M79675 Pain in left toe(s): Secondary | ICD-10-CM

## 2022-04-23 DIAGNOSIS — M79674 Pain in right toe(s): Secondary | ICD-10-CM | POA: Diagnosis not present

## 2022-04-23 DIAGNOSIS — B351 Tinea unguium: Secondary | ICD-10-CM | POA: Diagnosis not present

## 2022-04-23 DIAGNOSIS — Z794 Long term (current) use of insulin: Secondary | ICD-10-CM

## 2022-04-23 NOTE — Patient Instructions (Signed)
Apply triple antibiotic ointment to right great toe once daily for one week. Call office if condition does not improve.  Ingrown Toenail  An ingrown toenail occurs when the corner or sides of a toenail grow into the surrounding skin. This causes discomfort and pain. The big toe is most commonly affected, but any of the toes can be affected. If an ingrown toenail is not treated, it can become infected. What are the causes? This condition may be caused by: Wearing shoes that are too small or tight. An injury, such as stubbing your toe or having your toe stepped on. Improper cutting or care of your toenails. Having nail or foot abnormalities that were present from birth (congenital abnormalities), such as having a nail that is too big for your toe. What increases the risk? The following factors may make you more likely to develop ingrown toenails: Age. Nails tend to get thicker with age, so ingrown nails are more common among older people. Cutting your toenails incorrectly, such as cutting them very short or cutting them unevenly. An ingrown toenail is more likely to get infected if you have: Diabetes. Blood flow (circulation) problems. What are the signs or symptoms? Symptoms of an ingrown toenail may include: Pain, soreness, or tenderness. Redness. Swelling. Hardening of the skin that surrounds the toenail. Signs that an ingrown toenail may be infected include: Fluid or pus. Symptoms that get worse. How is this diagnosed? Ingrown toenails may be diagnosed based on: Your symptoms and medical history. A physical exam. Labs or tests. If you have fluid or blood coming from your toenail, a sample may be collected to test for the specific type of bacteria that is causing the infection. How is this treated? Treatment depends on the severity of your symptoms. You may be able to care for your toenail at home. If you have an infection, you may be prescribed antibiotic medicines. If you have  fluid or pus draining from your toenail, your health care provider may drain it. If you have trouble walking, you may be given crutches to use. If you have a severe or infected ingrown toenail, you may need a procedure to remove part or all of the nail. Follow these instructions at home: Foot care  Check your wound every day for signs of infection, or as often as told by your health care provider. Check for: More redness, swelling, or pain. More fluid or blood. Warmth. Pus or a bad smell. Do not pick at your toenail or try to remove it yourself. Soak your foot in warm, soapy water. Do this for 20 minutes, 3 times a day, or as often as told by your health care provider. This helps to keep your toe clean and your skin soft. Wear shoes that fit well and are not too tight. Your health care provider may recommend that you wear open-toed shoes while you heal. Trim your toenails regularly and carefully. Cut your toenails straight across to prevent injury to the skin at the corners of the toenail. Do not cut your nails in a curved shape. Keep your feet clean and dry to help prevent infection. General instructions Take over-the-counter and prescription medicines only as told by your health care provider. If you were prescribed an antibiotic, take it as told by your health care provider. Do not stop taking the antibiotic even if you start to feel better. If your health care provider told you to use crutches to help you move around, use them as instructed. Return to  your normal activities as told by your health care provider. Ask your health care provider what activities are safe for you. Keep all follow-up visits. This is important. Contact a health care provider if: You have more redness, swelling, pain, or other symptoms that do not improve with treatment. You have fluid, blood, or pus coming from your toenail. You have a red streak on your skin that starts at your foot and spreads up your leg. You  have a fever. Summary An ingrown toenail occurs when the corner or sides of a toenail grow into the surrounding skin. This causes discomfort and pain. The big toe is most commonly affected, but any of the toes can be affected. If an ingrown toenail is not treated, it can become infected. Fluid or pus draining from your toenail is a sign of infection. Your health care provider may need to drain it. You may be given antibiotics to treat the infection. Trimming your toenails regularly and properly can help you prevent an ingrown toenail. This information is not intended to replace advice given to you by your health care provider. Make sure you discuss any questions you have with your health care provider. Document Revised: 10/10/2020 Document Reviewed: 10/10/2020 Elsevier Patient Education  [place].

## 2022-04-23 NOTE — Progress Notes (Signed)
  Subjective:  Patient ID: Matthew Calderon, male    DOB: 28-Nov-1932,  MRN: [id number]  Essie Christine presents to clinic today for preventative diabetic foot care and painful elongated mycotic toenails 1-5 bilaterally which are tender when wearing enclosed shoe gear. Pain is relieved with periodic professional debridement. Chief Complaint  Patient presents with   Nail Problem    DFC BG - pt states he does not check his BG  A1C - pt does not recall  PCP - Dr Shelia Media  . New problem(s): None.   PCP is Deland Pretty, MD.  Allergies  Allergen Reactions   Alendronate Sodium     Other reaction(s): diarrhea   Penicillin G     Other reaction(s): Unknown   Penicillins Hives    DID THE REACTION INVOLVE: Swelling of the face/tongue/throat, SOB, or low BP? No Sudden or severe rash/hives, skin peeling, or the inside of the mouth or nose? No Did it require medical treatment? No When did it last happen?childhood allergy       If all above answers are "NO", may proceed with cephalosporin use.    Sulfa Antibiotics Hives   Sulfacetamide Sodium     Other reaction(s): Unknown    Review of Systems: Negative except as noted in the HPI.  Objective: No changes noted in today's physical examination.  Matthew Calderon is a pleasant 88 y.o. male WD, WN in NAD. AAO x 3.  Vascular Examination: CFT <3 seconds b/l LE. Palpable DP/PT pulses b/l LE. Digital hair sparse b/l. Skin temperature gradient WNL b/l. No pain with calf compression b/l. No edema noted b/l. No cyanosis or clubbing noted b/l LE.  Dermatological Examination: Pedal integument with normal turgor, texture and tone b/l LE. No open wounds b/l. No interdigital macerations b/l. Toenails 1-5 b/l elongated, thickened, discolored with subungual debris. +Tenderness with dorsal palpation of nailplates.   No hyperkeratotic or porokeratotic lesions present.   Incurvated nailplate right great toe lateral border(s) with tenderness to palpation. No  erythema, no edema, no drainage noted.   Musculoskeletal Examination: Muscle strength 5/5 to all lower extremity muscle groups bilaterally. No pain, crepitus or joint limitation noted with ROM bilateral LE. HAV with bunion deformity noted b/l LE. Utilizes rollator for ambulation assistance.  Neurological Examination: Protective sensation intact 5/5 intact bilaterally with 10g monofilament b/l. Vibratory sensation intact b/l. Proprioception intact bilaterally.  Assessment/Plan: 1. Pain due to onychomycosis of toenails of both feet   2. Ingrown toenail without infection   3. Type 2 diabetes mellitus without complication, with long-term current use of insulin (HCC)     No orders of the defined types were placed in this encounter.   -Caregiver/provider present with patient on today's visit. -Examined patient. -Toenails 2-5 bilaterally and L hallux debrided in length and girth without iatrogenic bleeding with sterile nail nipper and dremel.  -No invasive procedure(s) performed. Offending nail border debrided and curretaged lateral border right hallux utilizing sterile nail nipper and currette. Border(s) cleansed with alcohol and triple antibiotic ointment applied. Patient/POA/Caregiver/Facility instructed to apply triple antibiotic ointment  to R hallux once daily for 7 days. Call office if there are any concerns. -Patient/POA to call should there be question/concern in the interim.   Return in about 3 months (around 07/24/2022).  Marzetta Board, DPM

## 2022-07-30 ENCOUNTER — Ambulatory Visit (INDEPENDENT_AMBULATORY_CARE_PROVIDER_SITE_OTHER): Payer: Medicare PPO | Admitting: Podiatry

## 2022-07-30 ENCOUNTER — Encounter: Payer: Self-pay | Admitting: Podiatry

## 2022-07-30 VITALS — BP 110/67 | HR 76 | Temp 97.2°F

## 2022-07-30 DIAGNOSIS — M79675 Pain in left toe(s): Secondary | ICD-10-CM

## 2022-07-30 DIAGNOSIS — M2011 Hallux valgus (acquired), right foot: Secondary | ICD-10-CM

## 2022-07-30 DIAGNOSIS — L600 Ingrowing nail: Secondary | ICD-10-CM | POA: Diagnosis not present

## 2022-07-30 DIAGNOSIS — E119 Type 2 diabetes mellitus without complications: Secondary | ICD-10-CM

## 2022-07-30 DIAGNOSIS — M79674 Pain in right toe(s): Secondary | ICD-10-CM

## 2022-07-30 DIAGNOSIS — B351 Tinea unguium: Secondary | ICD-10-CM

## 2022-07-30 DIAGNOSIS — M2012 Hallux valgus (acquired), left foot: Secondary | ICD-10-CM | POA: Diagnosis not present

## 2022-07-30 DIAGNOSIS — Z794 Long term (current) use of insulin: Secondary | ICD-10-CM | POA: Diagnosis not present

## 2022-07-30 NOTE — Progress Notes (Unsigned)
ANNUAL DIABETIC FOOT EXAM  Subjective: Matthew Calderon presents today {jgcomplaint:23593}.  Chief Complaint  Patient presents with   Follow-up    Diabetic foot care Patient is a diabetic B.S.am-n/a Last A1c-n/a PCP- Deland Pretty, MD/ Last visit-n/a Patient is taking all medications prescribed.   Patient confirms h/o diabetes.  Patient relates {Numbers; 0-100:15068} year h/o diabetes.  Patient denies any h/o foot wounds.  Patient has h/o foot ulcer of {jgPodToeLocator:23637}, which healed via help of ***.  Patient has h/o amputation(s): {jgamp:23617}.  Patient endorses symptoms of foot numbness.   Patient endorses symptoms of foot tingling.  Patient endorses symptoms of burning in feet.  Patient endorses symptoms of pins/needles sensation in feet.  Patient denies any numbness, tingling, burning, or pins/needle sensation in feet.  Patient has been diagnosed with neuropathy and it is managed with {JGNEUROPATHYMEDS:27053}.  Risk factors: {jgriskfactors:24044}.  Deland Pretty, MD is patient's PCP. Last visit was {Time; dates multiple:15870}***.  Past Medical History:  Diagnosis Date   Anemia    takes iron pill   Anxiety    Arthritis    Depression    Diabetes mellitus without complication (HCC)    GERD (gastroesophageal reflux disease)    pepto   H/O hiatal hernia    Pneumonia    4 years ago   PONV (postoperative nausea and vomiting) 05/14/2019   Post-nasal drip    Prostate hyperplasia, benign localized, without urinary obstruction    Seasonal allergies    Sleep apnea    no CPAP   Patient Active Problem List   Diagnosis Date Noted   Age-related osteoporosis without current pathological fracture 03/17/2021   Penicillin allergy 03/17/2021   Androgen deficiency 03/17/2021   Disorder of lipid metabolism 03/17/2021   Diverticular disease of colon 03/17/2021   Drowsiness 03/17/2021   Encounter for general adult medical examination with abnormal findings  03/17/2021   Essential hypertension 03/17/2021   Gout 03/17/2021   History of psychiatric disorder 03/17/2021   Hypocalcemia 03/17/2021   Intertrigo 03/17/2021   Iron deficiency anemia 03/17/2021   Macular degeneration 03/17/2021   Peripheral arterial disease ([place]) 03/17/2021   Physical deconditioning 03/17/2021   Scoliosis 03/17/2021   Unequal leg length 03/17/2021   Vitamin B12 deficiency 03/17/2021   Vitamin D deficiency 03/17/2021   Macular pucker of both eyes 11/07/2020   Retinal edema 11/07/2020   Fall 01/14/2020   Chronic diastolic CHF (congestive heart failure) ([place]) 01/12/2020   Multiple rib fractures 01/11/2020   Dysequilibrium 01/11/2020   Fall at home, initial encounter 01/11/2020   Head trauma, subsequent encounter 01/11/2020   Arthritis 05/14/2019   Enlarged prostate 05/14/2019   History of tobacco abuse 05/14/2019   Interstitial cystitis 05/14/2019   Obesity 05/14/2019   OSA (obstructive sleep apnea) 05/14/2019   PONV (postoperative nausea and vomiting) 05/14/2019   Post-nasal drip 05/14/2019   Seasonal allergies 05/14/2019   Chronic cough 07/21/2018   GERD (gastroesophageal reflux disease) 07/21/2018   NSTEMI (non-ST elevated myocardial infarction) ([place]) 06/20/2018   Prostate hyperplasia, benign localized, without urinary obstruction    Hyperglycemia    Syncope, cardiogenic    Degenerative joint disease (DJD) of hip 01/13/2014   Diabetes ([place]) 01/13/2014   Pseudophakia, both eyes 12/01/2013   Nonexudative age-related macular degeneration 01/16/2013   Primary open angle glaucoma of right eye, severe stage 01/13/2013   Allergic rhinitis 09/17/2010   Anemia, unspecified 09/17/2010   Gastro-esophageal reflux disease with esophagitis 12/27/2009   Other and unspecified hyperlipidemia 12/27/2009   Hypertrophy of  prostate without urinary obstruction and other lower urinary tract symptoms (LUTS) 07/11/2009   Insomnia 07/11/2009   Osteoporosis 07/11/2009   Other  malaise and fatigue 06/15/2008   Depressive disorder, not elsewhere classified 05/04/2008   Unspecified glaucoma 05/04/2008   Past Surgical History:  Procedure Laterality Date   BACK SURGERY  1995   discectomy   COLONOSCOPY W/ BIOPSIES AND POLYPECTOMY     EYE SURGERY Bilateral    cataracts   EYE SURGERY Bilateral    glaucoma shunts   JOINT REPLACEMENT Left 1992   knee   JOINT REPLACEMENT Right 1996   knee   JOINT REPLACEMENT Right 2009   hip   JOINT REPLACEMENT Left 2005   hip   KNEE ARTHROSCOPY Left 1981   KNEE ARTHROSCOPY WITH PATELLA RECONSTRUCTION Right 2010   KNEE SURGERY Bilateral 1954   knee surgery and placed in a cast for 6 weeks   KNEE SURGERY  1961   cartilage removed   LEG SURGERY Bilateral 1974, 1975   straighten legs   SHOULDER OPEN ROTATOR CUFF REPAIR Right 2010   TONSILLECTOMY     TOTAL HIP ARTHROPLASTY Right 01/13/2014   Procedure: TOTAL HIP ARTHROPLASTY ANTERIOR APPROACH;  Surgeon: Hessie Dibble, MD;  Location: [facility];  Service: Orthopedics;  Laterality: Right;   Current Outpatient Medications on File Prior to Visit  Medication Sig Dispense Refill   aspirin 81 MG chewable tablet Chew 81 mg by mouth daily.     aspirin 81 MG chewable tablet 1 tablet     aspirin EC 81 MG EC tablet Take 1 tablet (81 mg total) by mouth daily.     atorvastatin (LIPITOR) 40 MG tablet Take 1 tablet (40 mg total) by mouth daily at 6 PM. (Patient taking differently: Take 40 mg by mouth every evening.)     atorvastatin (LIPITOR) 40 MG tablet 1 tablet     brimonidine (ALPHAGAN) 0.2 % ophthalmic solution Place 1 drop into both eyes 2 (two) times daily.     carvedilol (COREG) 12.5 MG tablet Take 1 tablet (12.5 mg total) by mouth 2 (two) times daily with a meal.     carvedilol (COREG) 6.25 MG tablet TAKE 1 TABLET BY MOUTH TWICE DAILY *TAKE WITH FOOD*     cetirizine (ZYRTEC) 10 MG tablet 1 tablet     Cholecalciferol (D3-1000) 25 MCG (1000 UT) capsule 2 tablet     Cholecalciferol 25  MCG (1000 UT) tablet Take 2,000 Units by mouth every morning.      Empagliflozin-Linaglip-Metform (TRIJARDY XR) 25-10-998 MG TB24 1 tablet with the morning meal     famotidine (PEPCID) 20 MG tablet      famotidine (PEPCID) 20 MG tablet 1 tablet at bedtime as needed     Ferrous Sulfate 143 (45 FE) MG TBCR Take 1 tablet by mouth daily.     Ferrous Sulfate Dried 143 (45 Fe) MG TBCR ferrous sulfate ER 143 mg (45 mg iron) tablet,extended release   1 {tbl} by oral route.     finasteride (PROSCAR) 5 MG tablet Take 5 mg by mouth every evening.      finasteride (PROSCAR) 5 MG tablet 1 tablet     fluticasone (FLONASE) 50 MCG/ACT nasal spray 2 spray in each nostril     furosemide (LASIX) 20 MG tablet Take 20 mg by mouth daily as needed for fluid or edema.     gentamicin ointment (GARAMYCIN) 0.1 %      insulin aspart (NOVOLOG) 100 UNIT/ML injection insulin  aspart U-100  100 unit/mL subcutaneous solution   0 units by sub-q route.     Melatonin 10 MG TBCR Take 1 tablet by mouth at bedtime.     metFORMIN (GLUCOPHAGE) 500 MG tablet Take 500 mg by mouth every evening.     metFORMIN (GLUCOPHAGE-XR) 500 MG 24 hr tablet      mirabegron ER (MYRBETRIQ) 50 MG TB24 tablet 1 tablet     Multiple Vitamins-Minerals (PRESERVISION AREDS 2 PO) 1 capsule     Multiple Vitamins-Minerals (PRESERVISION AREDS 2) CAPS Take 1 capsule by mouth 2 (two) times daily.      nitrofurantoin, macrocrystal-monohydrate, (MACROBID) 100 MG capsule Take 100 mg by mouth 2 (two) times daily.     ondansetron (ZOFRAN) 4 MG tablet      potassium chloride (K-DUR,KLOR-CON) 10 MEQ tablet Take 1 tablet (10 mEq total) by mouth daily.     potassium chloride (KLOR-CON) 10 MEQ tablet 1 tablet     RA MELATONIN 10 MG TABS Take 1 tablet by mouth at bedtime.     sacubitril-valsartan (ENTRESTO) 24-26 MG 1     sertraline (ZOLOFT) 50 MG tablet Take 50 mg by mouth every evening.     sertraline (ZOLOFT) 50 MG tablet 0.5 tablet     SYNJARDY XR 25-1000 MG TB24  Take 1 tablet by mouth daily.      tamsulosin (FLOMAX) 0.4 MG CAPS capsule 1 capsule     timolol (TIMOPTIC) 0.5 % ophthalmic solution Place 1 drop into both eyes 2 (two) times daily.     timolol (TIMOPTIC) 0.5 % ophthalmic solution 1 drop into each eye     traMADol (ULTRAM) 50 MG tablet Take 1 tablet (50 mg total) by mouth every 6 (six) hours as needed. 20 tablet 0   traZODone (DESYREL) 50 MG tablet Take 50 mg by mouth at bedtime.     No current facility-administered medications on file prior to visit.    Allergies  Allergen Reactions   Alendronate Sodium     Other reaction(s): diarrhea   Penicillin G     Other reaction(s): Unknown   Penicillins Hives    DID THE REACTION INVOLVE: Swelling of the face/tongue/throat, SOB, or low BP? No Sudden or severe rash/hives, skin peeling, or the inside of the mouth or nose? No Did it require medical treatment? No When did it last happen?childhood allergy       If all above answers are "NO", may proceed with cephalosporin use.    Sulfa Antibiotics Hives   Sulfacetamide Sodium     Other reaction(s): Unknown   Social History   Occupational History   Not on file  Tobacco Use   Smoking status: Former    Packs/day: 1.00    Years: 18.00    Total pack years: 18.00    Types: Cigarettes    Quit date: 1978    Years since quitting: 46.1   Smokeless tobacco: Never   Tobacco comments:    stopped 45 years ago  Vaping Use   Vaping Use: Never used  Substance and Sexual Activity   Alcohol use: Yes    Comment: social   Drug use: No   Sexual activity: Not on file   Family History  Problem Relation Age of Onset   Arthritis Mother    Arthritis Father    Immunization History  Administered Date(s) Administered   Influenza, High Dose Seasonal PF 04/15/2018   Tdap 04/15/2018   Zoster Recombinat (Shingrix) 02/16/2019     Review  of Systems: Negative except as noted in the HPI.   Objective: Vitals:   07/30/22 1445  BP: 110/67  Pulse: 76   Temp: (!) 97.2 F (36.2 C)  SpO2: 93%    Matthew Calderon is a pleasant 89 y.o. male in NAD. AAO X 3.  Vascular Examination: {jgvascular:23595}  Dermatological Examination: {jgderm:23598}  Neurological Examination: {jgneuro:23601::"Protective sensation intact 5/5 intact bilaterally with 10g monofilament b/l.","Vibratory sensation intact b/l.","Proprioception intact bilaterally."}  Musculoskeletal Examination: {jgmsk:23600}  Footwear Assessment: Does the patient wear appropriate shoes? {Yes,No}. Does the patient need inserts/orthotics? {Yes,No}.  Lab Results  Component Value Date   HGBA1C 6.8 (H) 01/11/2020    No results found. ADA Risk Categorization: Low Risk :  Patient has all of the following: Intact protective sensation No prior foot ulcer  No severe deformity Pedal pulses present  High Risk  Patient has one or more of the following: Loss of protective sensation Absent pedal pulses Severe Foot deformity History of foot ulcer  Assessment: No diagnosis found.   Plan: No orders of the defined types were placed in this encounter.   No orders of the defined types were placed in this encounter.   None  {jgplan:23602::"-Patient/POA to call should there be question/concern in the interim."} Return in about 3 months (around 10/28/2022).  Marzetta Board, DPM

## 2022-09-09 DIAGNOSIS — M810 Age-related osteoporosis without current pathological fracture: Secondary | ICD-10-CM | POA: Diagnosis not present

## 2022-09-09 DIAGNOSIS — E118 Type 2 diabetes mellitus with unspecified complications: Secondary | ICD-10-CM | POA: Diagnosis not present

## 2022-09-23 DIAGNOSIS — M810 Age-related osteoporosis without current pathological fracture: Secondary | ICD-10-CM | POA: Diagnosis not present

## 2022-10-03 DIAGNOSIS — Z0000 Encounter for general adult medical examination without abnormal findings: Secondary | ICD-10-CM | POA: Diagnosis not present

## 2022-10-03 DIAGNOSIS — I5022 Chronic systolic (congestive) heart failure: Secondary | ICD-10-CM | POA: Diagnosis not present

## 2022-10-03 DIAGNOSIS — H3530 Unspecified macular degeneration: Secondary | ICD-10-CM | POA: Diagnosis not present

## 2022-10-03 DIAGNOSIS — E789 Disorder of lipoprotein metabolism, unspecified: Secondary | ICD-10-CM | POA: Diagnosis not present

## 2022-10-03 DIAGNOSIS — E118 Type 2 diabetes mellitus with unspecified complications: Secondary | ICD-10-CM | POA: Diagnosis not present

## 2022-10-23 DIAGNOSIS — H3530 Unspecified macular degeneration: Secondary | ICD-10-CM | POA: Diagnosis not present

## 2022-10-23 DIAGNOSIS — Z9849 Cataract extraction status, unspecified eye: Secondary | ICD-10-CM | POA: Diagnosis not present

## 2022-10-23 DIAGNOSIS — H353132 Nonexudative age-related macular degeneration, bilateral, intermediate dry stage: Secondary | ICD-10-CM | POA: Diagnosis not present

## 2022-10-23 DIAGNOSIS — E119 Type 2 diabetes mellitus without complications: Secondary | ICD-10-CM | POA: Diagnosis not present

## 2022-10-23 DIAGNOSIS — H53143 Visual discomfort, bilateral: Secondary | ICD-10-CM | POA: Diagnosis not present

## 2022-10-23 DIAGNOSIS — Z961 Presence of intraocular lens: Secondary | ICD-10-CM | POA: Diagnosis not present

## 2022-10-29 DIAGNOSIS — H353123 Nonexudative age-related macular degeneration, left eye, advanced atrophic without subfoveal involvement: Secondary | ICD-10-CM | POA: Diagnosis not present

## 2022-10-29 DIAGNOSIS — Z961 Presence of intraocular lens: Secondary | ICD-10-CM | POA: Diagnosis not present

## 2022-10-29 DIAGNOSIS — H353112 Nonexudative age-related macular degeneration, right eye, intermediate dry stage: Secondary | ICD-10-CM | POA: Diagnosis not present

## 2022-10-29 DIAGNOSIS — E119 Type 2 diabetes mellitus without complications: Secondary | ICD-10-CM | POA: Diagnosis not present

## 2022-10-29 DIAGNOSIS — H43823 Vitreomacular adhesion, bilateral: Secondary | ICD-10-CM | POA: Diagnosis not present

## 2022-10-29 DIAGNOSIS — H401132 Primary open-angle glaucoma, bilateral, moderate stage: Secondary | ICD-10-CM | POA: Diagnosis not present

## 2022-11-12 DIAGNOSIS — H353124 Nonexudative age-related macular degeneration, left eye, advanced atrophic with subfoveal involvement: Secondary | ICD-10-CM | POA: Diagnosis not present

## 2022-11-13 ENCOUNTER — Encounter: Payer: Self-pay | Admitting: Podiatry

## 2022-11-13 ENCOUNTER — Ambulatory Visit (INDEPENDENT_AMBULATORY_CARE_PROVIDER_SITE_OTHER): Payer: Medicare PPO | Admitting: Podiatry

## 2022-11-13 DIAGNOSIS — B351 Tinea unguium: Secondary | ICD-10-CM

## 2022-11-13 DIAGNOSIS — M79675 Pain in left toe(s): Secondary | ICD-10-CM | POA: Diagnosis not present

## 2022-11-13 DIAGNOSIS — M79674 Pain in right toe(s): Secondary | ICD-10-CM

## 2022-11-13 DIAGNOSIS — E119 Type 2 diabetes mellitus without complications: Secondary | ICD-10-CM

## 2022-11-13 DIAGNOSIS — Z794 Long term (current) use of insulin: Secondary | ICD-10-CM | POA: Diagnosis not present

## 2022-11-17 NOTE — Progress Notes (Signed)
  Subjective:  Patient ID: Matthew Calderon, male    DOB: Dec 18, 1932,  MRN: [id number]  Matthew Calderon presents to clinic today for preventative diabetic foot care and painful elongated mycotic toenails 1-5 bilaterally which are tender when wearing enclosed shoe gear. Pain is relieved with periodic professional debridement. Chief Complaint  Patient presents with   Nail Problem    DFC BS-do not check A1C-do not know PCP-[facility] PCP [facility]- 1 month ago    PCP is Merri Brunette, MD.  Allergies  Allergen Reactions   Alendronate Sodium     Other reaction(s): diarrhea   Penicillin G     Other reaction(s): Unknown   Penicillins Hives    DID THE REACTION INVOLVE: Swelling of the face/tongue/throat, SOB, or low BP? No Sudden or severe rash/hives, skin peeling, or the inside of the mouth or nose? No Did it require medical treatment? No When did it last happen?childhood allergy       If all above answers are "NO", may proceed with cephalosporin use.    Sulfa Antibiotics Hives   Sulfacetamide Sodium     Other reaction(s): Unknown    Review of Systems: Negative except as noted in the HPI.  Objective: No changes noted in today's physical examination. There were no vitals filed for this visit.  Matthew Calderon is a pleasant 89 y.o. male in NAD. AAO x 3.  Vascular Examination: Capillary refill time <3 seconds b/l LE. Palpable pedal pulses b/l LE. Digital hair present b/l. No pedal edema b/l. Skin temperature gradient WNL b/l. No varicosities b/l. No cyanosis or clubbing noted b/l LE.[name]  Dermatological Examination: Pedal skin with normal turgor, texture and tone b/l. No open wounds. No interdigital macerations b/l. Toenails 1-5 b/l thickened, discolored, dystrophic with subungual debris. There is pain on palpation to dorsal aspect of nailplates. No hyperkeratotic nor porokeratotic lesions present on today's visit.[name]  Neurological Examination: Protective sensation intact with 10 gram monofilament  b/l LE. Vibratory sensation intact b/l LE.   Musculoskeletal Examination: Muscle strength 5/5 to all LE muscle groups b/l. HAV with bunion deformity noted b/l LE. Hammertoe deformity noted 2-5 b/l.  Assessment/Plan: 1. Pain due to onychomycosis of toenails of both feet   2. Type 2 diabetes mellitus without complication, with long-term current use of insulin (HCC)     -Consent given for treatment as described below: -Examined patient. -Continue supportive shoe gear daily. -Mycotic toenails 1-5 bilaterally were debrided in length and girth with sterile nail nippers and dremel without incident. -Patient/POA to call should there be question/concern in the interim.   Return in about 3 months (around 02/13/2023).  Matthew Calderon, DPM

## 2022-12-02 DIAGNOSIS — R972 Elevated prostate specific antigen [PSA]: Secondary | ICD-10-CM | POA: Diagnosis not present

## 2022-12-02 DIAGNOSIS — N401 Enlarged prostate with lower urinary tract symptoms: Secondary | ICD-10-CM | POA: Diagnosis not present

## 2022-12-02 DIAGNOSIS — R351 Nocturia: Secondary | ICD-10-CM | POA: Diagnosis not present

## 2022-12-02 DIAGNOSIS — R3121 Asymptomatic microscopic hematuria: Secondary | ICD-10-CM | POA: Diagnosis not present

## 2022-12-24 DIAGNOSIS — H401132 Primary open-angle glaucoma, bilateral, moderate stage: Secondary | ICD-10-CM | POA: Diagnosis not present

## 2022-12-24 DIAGNOSIS — Z961 Presence of intraocular lens: Secondary | ICD-10-CM | POA: Diagnosis not present

## 2022-12-24 DIAGNOSIS — H353123 Nonexudative age-related macular degeneration, left eye, advanced atrophic without subfoveal involvement: Secondary | ICD-10-CM | POA: Diagnosis not present

## 2022-12-24 DIAGNOSIS — H353112 Nonexudative age-related macular degeneration, right eye, intermediate dry stage: Secondary | ICD-10-CM | POA: Diagnosis not present

## 2022-12-24 DIAGNOSIS — E119 Type 2 diabetes mellitus without complications: Secondary | ICD-10-CM | POA: Diagnosis not present

## 2022-12-24 DIAGNOSIS — H43823 Vitreomacular adhesion, bilateral: Secondary | ICD-10-CM | POA: Diagnosis not present

## 2023-02-05 DIAGNOSIS — M6281 Muscle weakness (generalized): Secondary | ICD-10-CM | POA: Diagnosis not present

## 2023-02-05 DIAGNOSIS — R278 Other lack of coordination: Secondary | ICD-10-CM | POA: Diagnosis not present

## 2023-02-05 DIAGNOSIS — R2689 Other abnormalities of gait and mobility: Secondary | ICD-10-CM | POA: Diagnosis not present

## 2023-02-05 DIAGNOSIS — E119 Type 2 diabetes mellitus without complications: Secondary | ICD-10-CM | POA: Diagnosis not present

## 2023-02-05 DIAGNOSIS — R2681 Unsteadiness on feet: Secondary | ICD-10-CM | POA: Diagnosis not present

## 2023-02-06 DIAGNOSIS — R278 Other lack of coordination: Secondary | ICD-10-CM | POA: Diagnosis not present

## 2023-02-06 DIAGNOSIS — R2681 Unsteadiness on feet: Secondary | ICD-10-CM | POA: Diagnosis not present

## 2023-02-06 DIAGNOSIS — E119 Type 2 diabetes mellitus without complications: Secondary | ICD-10-CM | POA: Diagnosis not present

## 2023-02-06 DIAGNOSIS — M6281 Muscle weakness (generalized): Secondary | ICD-10-CM | POA: Diagnosis not present

## 2023-02-06 DIAGNOSIS — R2689 Other abnormalities of gait and mobility: Secondary | ICD-10-CM | POA: Diagnosis not present

## 2023-02-07 DIAGNOSIS — H353123 Nonexudative age-related macular degeneration, left eye, advanced atrophic without subfoveal involvement: Secondary | ICD-10-CM | POA: Diagnosis not present

## 2023-02-10 DIAGNOSIS — E118 Type 2 diabetes mellitus with unspecified complications: Secondary | ICD-10-CM | POA: Diagnosis not present

## 2023-02-10 DIAGNOSIS — R2681 Unsteadiness on feet: Secondary | ICD-10-CM | POA: Diagnosis not present

## 2023-02-10 DIAGNOSIS — R278 Other lack of coordination: Secondary | ICD-10-CM | POA: Diagnosis not present

## 2023-02-10 DIAGNOSIS — E119 Type 2 diabetes mellitus without complications: Secondary | ICD-10-CM | POA: Diagnosis not present

## 2023-02-10 DIAGNOSIS — E789 Disorder of lipoprotein metabolism, unspecified: Secondary | ICD-10-CM | POA: Diagnosis not present

## 2023-02-10 DIAGNOSIS — M6281 Muscle weakness (generalized): Secondary | ICD-10-CM | POA: Diagnosis not present

## 2023-02-10 DIAGNOSIS — I10 Essential (primary) hypertension: Secondary | ICD-10-CM | POA: Diagnosis not present

## 2023-02-10 DIAGNOSIS — R2689 Other abnormalities of gait and mobility: Secondary | ICD-10-CM | POA: Diagnosis not present

## 2023-02-10 DIAGNOSIS — E538 Deficiency of other specified B group vitamins: Secondary | ICD-10-CM | POA: Diagnosis not present

## 2023-02-10 DIAGNOSIS — E559 Vitamin D deficiency, unspecified: Secondary | ICD-10-CM | POA: Diagnosis not present

## 2023-02-12 DIAGNOSIS — E119 Type 2 diabetes mellitus without complications: Secondary | ICD-10-CM | POA: Diagnosis not present

## 2023-02-12 DIAGNOSIS — M6281 Muscle weakness (generalized): Secondary | ICD-10-CM | POA: Diagnosis not present

## 2023-02-12 DIAGNOSIS — R2681 Unsteadiness on feet: Secondary | ICD-10-CM | POA: Diagnosis not present

## 2023-02-12 DIAGNOSIS — R278 Other lack of coordination: Secondary | ICD-10-CM | POA: Diagnosis not present

## 2023-02-12 DIAGNOSIS — R3121 Asymptomatic microscopic hematuria: Secondary | ICD-10-CM | POA: Diagnosis not present

## 2023-02-12 DIAGNOSIS — R2689 Other abnormalities of gait and mobility: Secondary | ICD-10-CM | POA: Diagnosis not present

## 2023-02-13 DIAGNOSIS — I252 Old myocardial infarction: Secondary | ICD-10-CM | POA: Diagnosis not present

## 2023-02-13 DIAGNOSIS — I5022 Chronic systolic (congestive) heart failure: Secondary | ICD-10-CM | POA: Diagnosis not present

## 2023-02-13 DIAGNOSIS — Z23 Encounter for immunization: Secondary | ICD-10-CM | POA: Diagnosis not present

## 2023-02-13 DIAGNOSIS — M810 Age-related osteoporosis without current pathological fracture: Secondary | ICD-10-CM | POA: Diagnosis not present

## 2023-02-18 DIAGNOSIS — R278 Other lack of coordination: Secondary | ICD-10-CM | POA: Diagnosis not present

## 2023-02-18 DIAGNOSIS — M6281 Muscle weakness (generalized): Secondary | ICD-10-CM | POA: Diagnosis not present

## 2023-02-18 DIAGNOSIS — R2689 Other abnormalities of gait and mobility: Secondary | ICD-10-CM | POA: Diagnosis not present

## 2023-02-18 DIAGNOSIS — R2681 Unsteadiness on feet: Secondary | ICD-10-CM | POA: Diagnosis not present

## 2023-02-18 DIAGNOSIS — E119 Type 2 diabetes mellitus without complications: Secondary | ICD-10-CM | POA: Diagnosis not present

## 2023-02-19 ENCOUNTER — Ambulatory Visit (INDEPENDENT_AMBULATORY_CARE_PROVIDER_SITE_OTHER): Payer: Medicare PPO | Admitting: Podiatry

## 2023-02-19 ENCOUNTER — Encounter: Payer: Self-pay | Admitting: Podiatry

## 2023-02-19 DIAGNOSIS — M79674 Pain in right toe(s): Secondary | ICD-10-CM

## 2023-02-19 DIAGNOSIS — R2681 Unsteadiness on feet: Secondary | ICD-10-CM | POA: Diagnosis not present

## 2023-02-19 DIAGNOSIS — M6281 Muscle weakness (generalized): Secondary | ICD-10-CM | POA: Diagnosis not present

## 2023-02-19 DIAGNOSIS — E119 Type 2 diabetes mellitus without complications: Secondary | ICD-10-CM | POA: Diagnosis not present

## 2023-02-19 DIAGNOSIS — M79675 Pain in left toe(s): Secondary | ICD-10-CM | POA: Diagnosis not present

## 2023-02-19 DIAGNOSIS — R278 Other lack of coordination: Secondary | ICD-10-CM | POA: Diagnosis not present

## 2023-02-19 DIAGNOSIS — Z794 Long term (current) use of insulin: Secondary | ICD-10-CM

## 2023-02-19 DIAGNOSIS — R2689 Other abnormalities of gait and mobility: Secondary | ICD-10-CM | POA: Diagnosis not present

## 2023-02-19 DIAGNOSIS — B351 Tinea unguium: Secondary | ICD-10-CM

## 2023-02-19 NOTE — Progress Notes (Signed)
  Subjective:  Patient ID: Matthew Calderon, male    DOB: [birth year],   MRN: [id number]  Chief Complaint  Patient presents with   Nail Problem    Pt present today for a diabetic foot care. Pt denies pain.    89 y.o. male presents for concern of thickened elongated and painful nails that are difficult to trim. Requesting to have them trimmed today. Relates burning and tingling in their feet. Patient is diabetic and last A1c was  Lab Results  Component Value Date   HGBA1C 6.8 (H) 01/11/2020   .   PCP:  Merri Brunette, MD    . Denies any other pedal complaints. Denies n/v/f/c.   Past Medical History:  Diagnosis Date   Anemia    takes iron pill   Anxiety    Arthritis    Depression    Diabetes mellitus without complication (HCC)    GERD (gastroesophageal reflux disease)    pepto   H/O hiatal hernia    Pneumonia    4 years ago   PONV (postoperative nausea and vomiting) 05/14/2019   Post-nasal drip    Prostate hyperplasia, benign localized, without urinary obstruction    Seasonal allergies    Sleep apnea    no CPAP    Objective:  Physical Exam: Oncology   Assessment:   1. Pain due to onychomycosis of toenails of both feet   2. Type 2 diabetes mellitus without complication, with long-term current use of insulin (HCC)      Plan:  Patient was evaluated and treated and all questions answered. -Discussed and educated patient on diabetic foot care, especially with  regards to the vascular, neurological and musculoskeletal systems.  -Stressed the importance of good glycemic control and the detriment of not  controlling glucose levels in relation to the foot. -Discussed supportive shoes at all times and checking feet regularly.  -Mechanically debrided all nails 1-5 bilateral using sterile nail nipper and filed with dremel without incident  -Answered all patient questions -Patient to return  in 3 months for at risk foot care -Patient advised to call the office if any problems  or questions arise in the meantime.   Louann Sjogren, DPM

## 2023-02-20 DIAGNOSIS — M6281 Muscle weakness (generalized): Secondary | ICD-10-CM | POA: Diagnosis not present

## 2023-02-20 DIAGNOSIS — R2681 Unsteadiness on feet: Secondary | ICD-10-CM | POA: Diagnosis not present

## 2023-02-20 DIAGNOSIS — E119 Type 2 diabetes mellitus without complications: Secondary | ICD-10-CM | POA: Diagnosis not present

## 2023-02-20 DIAGNOSIS — R278 Other lack of coordination: Secondary | ICD-10-CM | POA: Diagnosis not present

## 2023-02-20 DIAGNOSIS — R2689 Other abnormalities of gait and mobility: Secondary | ICD-10-CM | POA: Diagnosis not present

## 2023-02-21 DIAGNOSIS — R350 Frequency of micturition: Secondary | ICD-10-CM | POA: Diagnosis not present

## 2023-02-21 DIAGNOSIS — I10 Essential (primary) hypertension: Secondary | ICD-10-CM

## 2023-02-21 DIAGNOSIS — E785 Hyperlipidemia, unspecified: Secondary | ICD-10-CM | POA: Diagnosis not present

## 2023-02-21 DIAGNOSIS — G4733 Obstructive sleep apnea (adult) (pediatric): Secondary | ICD-10-CM | POA: Diagnosis not present

## 2023-02-21 DIAGNOSIS — I5032 Chronic diastolic (congestive) heart failure: Secondary | ICD-10-CM | POA: Diagnosis not present

## 2023-02-25 DIAGNOSIS — E119 Type 2 diabetes mellitus without complications: Secondary | ICD-10-CM | POA: Diagnosis not present

## 2023-02-25 DIAGNOSIS — R2689 Other abnormalities of gait and mobility: Secondary | ICD-10-CM | POA: Diagnosis not present

## 2023-02-25 DIAGNOSIS — R2681 Unsteadiness on feet: Secondary | ICD-10-CM | POA: Diagnosis not present

## 2023-02-26 DIAGNOSIS — M160 Bilateral primary osteoarthritis of hip: Secondary | ICD-10-CM | POA: Diagnosis not present

## 2023-02-26 DIAGNOSIS — M545 Low back pain, unspecified: Secondary | ICD-10-CM | POA: Diagnosis not present

## 2023-02-27 DIAGNOSIS — R2689 Other abnormalities of gait and mobility: Secondary | ICD-10-CM | POA: Diagnosis not present

## 2023-02-27 DIAGNOSIS — R2681 Unsteadiness on feet: Secondary | ICD-10-CM | POA: Diagnosis not present

## 2023-02-27 DIAGNOSIS — E119 Type 2 diabetes mellitus without complications: Secondary | ICD-10-CM | POA: Diagnosis not present

## 2023-02-28 DIAGNOSIS — I10 Essential (primary) hypertension: Secondary | ICD-10-CM | POA: Diagnosis not present

## 2023-02-28 DIAGNOSIS — N390 Urinary tract infection, site not specified: Secondary | ICD-10-CM | POA: Diagnosis not present

## 2023-02-28 DIAGNOSIS — E119 Type 2 diabetes mellitus without complications: Secondary | ICD-10-CM | POA: Diagnosis not present

## 2023-03-04 DIAGNOSIS — M810 Age-related osteoporosis without current pathological fracture: Secondary | ICD-10-CM | POA: Diagnosis not present

## 2023-03-12 DIAGNOSIS — R2689 Other abnormalities of gait and mobility: Secondary | ICD-10-CM | POA: Diagnosis not present

## 2023-03-12 DIAGNOSIS — E119 Type 2 diabetes mellitus without complications: Secondary | ICD-10-CM | POA: Diagnosis not present

## 2023-03-12 DIAGNOSIS — R2681 Unsteadiness on feet: Secondary | ICD-10-CM | POA: Diagnosis not present

## 2023-03-13 DIAGNOSIS — R2681 Unsteadiness on feet: Secondary | ICD-10-CM | POA: Diagnosis not present

## 2023-03-13 DIAGNOSIS — R2689 Other abnormalities of gait and mobility: Secondary | ICD-10-CM | POA: Diagnosis not present

## 2023-03-13 DIAGNOSIS — E119 Type 2 diabetes mellitus without complications: Secondary | ICD-10-CM | POA: Diagnosis not present

## 2023-03-18 DIAGNOSIS — R2681 Unsteadiness on feet: Secondary | ICD-10-CM | POA: Diagnosis not present

## 2023-03-18 DIAGNOSIS — E119 Type 2 diabetes mellitus without complications: Secondary | ICD-10-CM | POA: Diagnosis not present

## 2023-03-18 DIAGNOSIS — R2689 Other abnormalities of gait and mobility: Secondary | ICD-10-CM | POA: Diagnosis not present

## 2023-03-20 DIAGNOSIS — R2681 Unsteadiness on feet: Secondary | ICD-10-CM | POA: Diagnosis not present

## 2023-03-20 DIAGNOSIS — E119 Type 2 diabetes mellitus without complications: Secondary | ICD-10-CM | POA: Diagnosis not present

## 2023-03-20 DIAGNOSIS — R2689 Other abnormalities of gait and mobility: Secondary | ICD-10-CM | POA: Diagnosis not present

## 2023-03-24 DIAGNOSIS — H353123 Nonexudative age-related macular degeneration, left eye, advanced atrophic without subfoveal involvement: Secondary | ICD-10-CM | POA: Diagnosis not present

## 2023-04-02 DIAGNOSIS — R2689 Other abnormalities of gait and mobility: Secondary | ICD-10-CM | POA: Diagnosis not present

## 2023-04-02 DIAGNOSIS — E119 Type 2 diabetes mellitus without complications: Secondary | ICD-10-CM | POA: Diagnosis not present

## 2023-04-09 DIAGNOSIS — I5032 Chronic diastolic (congestive) heart failure: Secondary | ICD-10-CM | POA: Diagnosis not present

## 2023-04-09 DIAGNOSIS — I10 Essential (primary) hypertension: Secondary | ICD-10-CM | POA: Diagnosis not present

## 2023-04-15 DIAGNOSIS — M810 Age-related osteoporosis without current pathological fracture: Secondary | ICD-10-CM | POA: Diagnosis not present

## 2023-05-05 DIAGNOSIS — H353123 Nonexudative age-related macular degeneration, left eye, advanced atrophic without subfoveal involvement: Secondary | ICD-10-CM | POA: Diagnosis not present

## 2023-06-02 ENCOUNTER — Ambulatory Visit (INDEPENDENT_AMBULATORY_CARE_PROVIDER_SITE_OTHER): Payer: Medicare PPO | Admitting: Podiatry

## 2023-06-02 ENCOUNTER — Encounter: Payer: Self-pay | Admitting: Podiatry

## 2023-06-02 DIAGNOSIS — B351 Tinea unguium: Secondary | ICD-10-CM | POA: Diagnosis not present

## 2023-06-02 DIAGNOSIS — M79674 Pain in right toe(s): Secondary | ICD-10-CM | POA: Diagnosis not present

## 2023-06-02 DIAGNOSIS — L600 Ingrowing nail: Secondary | ICD-10-CM | POA: Diagnosis not present

## 2023-06-02 DIAGNOSIS — M79675 Pain in left toe(s): Secondary | ICD-10-CM

## 2023-06-02 NOTE — Progress Notes (Signed)
Subjective:  Patient ID: Matthew Calderon, male    DOB: 1933/03/05,  MRN: [id number]   Matthew Calderon presents to clinic today for:  Chief Complaint  Patient presents with   Debridement    Trim toenails-diabetic - 6.8  . Patient notes nails are thick, discolored, elongated and painful in shoegear when trying to ambulate.  Patient notes the right great toenail is a little red and sore.  Denies injury.  A caregiver is with him today  PCP is Eloisa Northern, MD.  Past Medical History:  Diagnosis Date   Anemia    takes iron pill   Anxiety    Arthritis    Depression    Diabetes mellitus without complication (HCC)    GERD (gastroesophageal reflux disease)    pepto   H/O hiatal hernia    Pneumonia    4 years ago   PONV (postoperative nausea and vomiting) 05/14/2019   Post-nasal drip    Prostate hyperplasia, benign localized, without urinary obstruction    Seasonal allergies    Sleep apnea    no CPAP    Past Surgical History:  Procedure Laterality Date   BACK SURGERY  1995   discectomy   COLONOSCOPY W/ BIOPSIES AND POLYPECTOMY     EYE SURGERY Bilateral    cataracts   EYE SURGERY Bilateral    glaucoma shunts   JOINT REPLACEMENT Left 1992   knee   JOINT REPLACEMENT Right 1996   knee   JOINT REPLACEMENT Right 2009   hip   JOINT REPLACEMENT Left 2005   hip   KNEE ARTHROSCOPY Left 1981   KNEE ARTHROSCOPY WITH PATELLA RECONSTRUCTION Right 2010   KNEE SURGERY Bilateral 1954   knee surgery and placed in a cast for 6 weeks   KNEE SURGERY  1961   cartilage removed   LEG SURGERY Bilateral 1974, 1975   straighten legs   SHOULDER OPEN ROTATOR CUFF REPAIR Right 2010   TONSILLECTOMY     TOTAL HIP ARTHROPLASTY Right 01/13/2014   Procedure: TOTAL HIP ARTHROPLASTY ANTERIOR APPROACH;  Surgeon: Velna Ochs, MD;  Location: [facility];  Service: Orthopedics;  Laterality: Right;    Allergies  Allergen Reactions   Alendronate Sodium     Other reaction(s): diarrhea   Penicillin G      Other reaction(s): Unknown   Penicillins Hives    DID THE REACTION INVOLVE: Swelling of the face/tongue/throat, SOB, or low BP? No Sudden or severe rash/hives, skin peeling, or the inside of the mouth or nose? No Did it require medical treatment? No When did it last happen?childhood allergy       If all above answers are "NO", may proceed with cephalosporin use.    Sulfa Antibiotics Hives   Sulfacetamide Sodium     Other reaction(s): Unknown    Review of Systems: Negative except as noted in the HPI.  Objective:  Matthew Calderon is a pleasant 89 y.o. male in NAD. AAO x 3.  Vascular Examination: Capillary refill time is 3-5 seconds to toes bilateral. Palpable pedal pulses b/l LE. Digital hair present b/l.  Skin temperature gradient WNL b/l. No varicosities b/l. No cyanosis noted b/l.   Dermatological Examination: Pedal skin with normal turgor, texture and tone b/l. No open wounds. No interdigital macerations b/l. Toenails x10 are 3mm thick, discolored, dystrophic with subungual debris. There is pain with compression of the nail plates.  They are elongated x10.  The right hallux has some mild  localized erythema to the distal pulp of the toe.  The hallux nail shape has incurvated borders distally.  No  paronychia or drainage is noted  Assessment/Plan: 1. Pain due to onychomycosis of toenails of both feet    The mycotic toenails were sharply debrided x10 with sterile nail nippers and a power debriding burr to decrease bulk/thickness and length.    The right hallux nail corners were cut back gently to alleviate any pinching of the skin or discomfort.  Antibiotic ointment was applied to the area.  On his paperwork to take back to his facility, it was recommended that antibiotic ointment (triple abx ointment) be applied to the right hallux nail borders every night for the next 5 to 7 days.  If he continues to have any issues we may need to perform PNA to the medial and lateral right hallux  nail borders  Return in about 3 months (around 08/31/2023) for RFC.   Clerance Lav, DPM, FACFAS [facility]     [address] [place], [place] [address]                Office [phone]  Fax [phone]

## 2023-08-12 ENCOUNTER — Encounter (HOSPITAL_COMMUNITY): Payer: Self-pay

## 2023-08-12 ENCOUNTER — Other Ambulatory Visit: Payer: Self-pay

## 2023-08-12 ENCOUNTER — Inpatient Hospital Stay (HOSPITAL_COMMUNITY)
Admission: EM | Admit: 2023-08-12 | Discharge: 2023-08-15 | DRG: 871 | Disposition: A | Discharge status: Home Health | Payer: Medicare PPO | Source: Skilled Nursing Facility | Attending: Internal Medicine | Admitting: Internal Medicine

## 2023-08-12 ENCOUNTER — Emergency Department (HOSPITAL_COMMUNITY): Payer: Medicare PPO

## 2023-08-12 DIAGNOSIS — E66811 Obesity, class 1: Secondary | ICD-10-CM | POA: Diagnosis present

## 2023-08-12 DIAGNOSIS — G4730 Sleep apnea, unspecified: Secondary | ICD-10-CM | POA: Diagnosis present

## 2023-08-12 DIAGNOSIS — E872 Acidosis, unspecified: Secondary | ICD-10-CM | POA: Diagnosis present

## 2023-08-12 DIAGNOSIS — E785 Hyperlipidemia, unspecified: Secondary | ICD-10-CM | POA: Diagnosis present

## 2023-08-12 DIAGNOSIS — R918 Other nonspecific abnormal finding of lung field: Secondary | ICD-10-CM | POA: Diagnosis not present

## 2023-08-12 DIAGNOSIS — Z7984 Long term (current) use of oral hypoglycemic drugs: Secondary | ICD-10-CM | POA: Diagnosis not present

## 2023-08-12 DIAGNOSIS — J189 Pneumonia, unspecified organism: Secondary | ICD-10-CM

## 2023-08-12 DIAGNOSIS — R531 Weakness: Secondary | ICD-10-CM | POA: Diagnosis not present

## 2023-08-12 DIAGNOSIS — E1165 Type 2 diabetes mellitus with hyperglycemia: Secondary | ICD-10-CM | POA: Diagnosis present

## 2023-08-12 DIAGNOSIS — J159 Unspecified bacterial pneumonia: Secondary | ICD-10-CM | POA: Diagnosis not present

## 2023-08-12 DIAGNOSIS — N400 Enlarged prostate without lower urinary tract symptoms: Secondary | ICD-10-CM | POA: Diagnosis present

## 2023-08-12 DIAGNOSIS — I959 Hypotension, unspecified: Secondary | ICD-10-CM | POA: Diagnosis not present

## 2023-08-12 DIAGNOSIS — I5032 Chronic diastolic (congestive) heart failure: Secondary | ICD-10-CM | POA: Diagnosis present

## 2023-08-12 DIAGNOSIS — Z7982 Long term (current) use of aspirin: Secondary | ICD-10-CM

## 2023-08-12 DIAGNOSIS — Z87891 Personal history of nicotine dependence: Secondary | ICD-10-CM

## 2023-08-12 DIAGNOSIS — Z1152 Encounter for screening for COVID-19: Secondary | ICD-10-CM | POA: Diagnosis not present

## 2023-08-12 DIAGNOSIS — Z888 Allergy status to other drugs, medicaments and biological substances status: Secondary | ICD-10-CM

## 2023-08-12 DIAGNOSIS — G4733 Obstructive sleep apnea (adult) (pediatric): Secondary | ICD-10-CM | POA: Diagnosis present

## 2023-08-12 DIAGNOSIS — K2100 Gastro-esophageal reflux disease with esophagitis, without bleeding: Secondary | ICD-10-CM | POA: Diagnosis present

## 2023-08-12 DIAGNOSIS — R509 Fever, unspecified: Secondary | ICD-10-CM | POA: Diagnosis not present

## 2023-08-12 DIAGNOSIS — Z7401 Bed confinement status: Secondary | ICD-10-CM | POA: Diagnosis not present

## 2023-08-12 DIAGNOSIS — A419 Sepsis, unspecified organism: Secondary | ICD-10-CM | POA: Diagnosis present

## 2023-08-12 DIAGNOSIS — Z96641 Presence of right artificial hip joint: Secondary | ICD-10-CM | POA: Diagnosis present

## 2023-08-12 DIAGNOSIS — R197 Diarrhea, unspecified: Secondary | ICD-10-CM | POA: Diagnosis not present

## 2023-08-12 DIAGNOSIS — Z882 Allergy status to sulfonamides status: Secondary | ICD-10-CM

## 2023-08-12 DIAGNOSIS — J302 Other seasonal allergic rhinitis: Secondary | ICD-10-CM | POA: Diagnosis present

## 2023-08-12 DIAGNOSIS — K219 Gastro-esophageal reflux disease without esophagitis: Secondary | ICD-10-CM | POA: Diagnosis present

## 2023-08-12 DIAGNOSIS — E876 Hypokalemia: Secondary | ICD-10-CM | POA: Diagnosis present

## 2023-08-12 DIAGNOSIS — Z79899 Other long term (current) drug therapy: Secondary | ICD-10-CM

## 2023-08-12 DIAGNOSIS — Z6831 Body mass index (BMI) 31.0-31.9, adult: Secondary | ICD-10-CM | POA: Diagnosis not present

## 2023-08-12 DIAGNOSIS — Z96653 Presence of artificial knee joint, bilateral: Secondary | ICD-10-CM | POA: Diagnosis present

## 2023-08-12 DIAGNOSIS — Z794 Long term (current) use of insulin: Secondary | ICD-10-CM

## 2023-08-12 DIAGNOSIS — I10 Essential (primary) hypertension: Secondary | ICD-10-CM | POA: Diagnosis present

## 2023-08-12 DIAGNOSIS — I110 Hypertensive heart disease with heart failure: Secondary | ICD-10-CM | POA: Diagnosis present

## 2023-08-12 DIAGNOSIS — R112 Nausea with vomiting, unspecified: Secondary | ICD-10-CM | POA: Diagnosis not present

## 2023-08-12 DIAGNOSIS — Z880 Allergy status to penicillin: Secondary | ICD-10-CM

## 2023-08-12 DIAGNOSIS — N179 Acute kidney failure, unspecified: Secondary | ICD-10-CM | POA: Diagnosis present

## 2023-08-12 DIAGNOSIS — E860 Dehydration: Secondary | ICD-10-CM | POA: Diagnosis not present

## 2023-08-12 DIAGNOSIS — H409 Unspecified glaucoma: Secondary | ICD-10-CM | POA: Diagnosis present

## 2023-08-12 DIAGNOSIS — E119 Type 2 diabetes mellitus without complications: Secondary | ICD-10-CM

## 2023-08-12 DIAGNOSIS — Z96643 Presence of artificial hip joint, bilateral: Secondary | ICD-10-CM | POA: Diagnosis present

## 2023-08-12 DIAGNOSIS — R0902 Hypoxemia: Secondary | ICD-10-CM | POA: Diagnosis not present

## 2023-08-12 LAB — CBC WITH DIFFERENTIAL/PLATELET
Abs Immature Granulocytes: 0.44 10*3/uL — ABNORMAL HIGH (ref 0.00–0.07)
Basophils Absolute: 0 10*3/uL (ref 0.0–0.1)
Basophils Relative: 0 %
Eosinophils Absolute: 0.2 10*3/uL (ref 0.0–0.5)
Eosinophils Relative: 1 %
HCT: 39.1 % (ref 39.0–52.0)
Hemoglobin: 12.4 g/dL — ABNORMAL LOW (ref 13.0–17.0)
Immature Granulocytes: 3 %
Lymphocytes Relative: 5 %
Lymphs Abs: 0.6 10*3/uL — ABNORMAL LOW (ref 0.7–4.0)
MCH: 28.9 pg (ref 26.0–34.0)
MCHC: 31.7 g/dL (ref 30.0–36.0)
MCV: 91.1 fL (ref 80.0–100.0)
Monocytes Absolute: 0.9 10*3/uL (ref 0.1–1.0)
Monocytes Relative: 7 %
Neutro Abs: 11.3 10*3/uL — ABNORMAL HIGH (ref 1.7–7.7)
Neutrophils Relative %: 84 %
Platelets: 198 10*3/uL (ref 150–400)
RBC: 4.29 MIL/uL (ref 4.22–5.81)
RDW: 14.2 % (ref 11.5–15.5)
WBC: 13.5 10*3/uL — ABNORMAL HIGH (ref 4.0–10.5)
nRBC: 0 % (ref 0.0–0.2)

## 2023-08-12 LAB — I-STAT CG4 LACTIC ACID, ED: Lactic Acid, Venous: 1.4 mmol/L (ref 0.5–1.9)

## 2023-08-12 LAB — URINALYSIS, W/ REFLEX TO CULTURE (INFECTION SUSPECTED)
Bacteria, UA: NONE SEEN
Bilirubin Urine: NEGATIVE
Glucose, UA: >=500 mg/dL — AB
Hgb urine dipstick: NEGATIVE
Ketones, ur: 20 mg/dL — AB
Nitrite: NEGATIVE
Protein, ur: NEGATIVE mg/dL
Specific Gravity, Urine: 1.014 (ref 1.005–1.030)
pH: 5 (ref 5.0–8.0)

## 2023-08-12 LAB — COMPREHENSIVE METABOLIC PANEL
ALT: 14 U/L (ref 0–44)
AST: 16 U/L (ref 15–41)
Albumin: 3.6 g/dL (ref 3.5–5.0)
Alkaline Phosphatase: 72 U/L (ref 38–126)
Anion gap: 14 (ref 5–15)
BUN: 34 mg/dL — ABNORMAL HIGH (ref 8–23)
CO2: 17 mmol/L — ABNORMAL LOW (ref 22–32)
Calcium: 8.3 mg/dL — ABNORMAL LOW (ref 8.9–10.3)
Chloride: 105 mmol/L (ref 98–111)
Creatinine, Ser: 1.4 mg/dL — ABNORMAL HIGH (ref 0.61–1.24)
GFR, Estimated: 48 mL/min — ABNORMAL LOW (ref >60)
Glucose, Bld: 134 mg/dL — ABNORMAL HIGH (ref 70–99)
Potassium: 4 mmol/L (ref 3.5–5.1)
Sodium: 136 mmol/L (ref 135–145)
Total Bilirubin: 1.5 mg/dL — ABNORMAL HIGH (ref 0.0–1.2)
Total Protein: 7.1 g/dL (ref 6.5–8.1)

## 2023-08-12 LAB — RESP PANEL BY RT-PCR (RSV, FLU A&B, COVID)  RVPGX2
Influenza A by PCR: NEGATIVE
Influenza B by PCR: NEGATIVE
Resp Syncytial Virus by PCR: NEGATIVE
SARS Coronavirus 2 by RT PCR: NEGATIVE

## 2023-08-12 LAB — CBG MONITORING, ED: Glucose-Capillary: 120 mg/dL — ABNORMAL HIGH (ref 70–99)

## 2023-08-12 LAB — GLUCOSE, CAPILLARY: Glucose-Capillary: 153 mg/dL — ABNORMAL HIGH (ref 70–99)

## 2023-08-12 MED ORDER — SODIUM CHLORIDE 0.9 % IV SOLN
2.0000 g | INTRAVENOUS | Status: DC
  Administered 2023-08-13 – 2023-08-14 (×3): 2 g via INTRAVENOUS
  Filled 2023-08-12 (×3): qty 20

## 2023-08-12 MED ORDER — LACTATED RINGERS IV SOLN
150.0000 mL/h | INTRAVENOUS | Status: AC
  Administered 2023-08-12 – 2023-08-13 (×3): 150 mL/h via INTRAVENOUS

## 2023-08-12 MED ORDER — LEVOFLOXACIN IN D5W 750 MG/150ML IV SOLN
750.0000 mg | Freq: Once | INTRAVENOUS | Status: AC
  Administered 2023-08-12: 750 mg via INTRAVENOUS
  Filled 2023-08-12: qty 150

## 2023-08-12 MED ORDER — ALBUTEROL SULFATE (2.5 MG/3ML) 0.083% IN NEBU: 2.5000 mg | INHALATION_SOLUTION | Freq: Four times a day (QID) | RESPIRATORY_TRACT | Status: DC

## 2023-08-12 MED ORDER — ONDANSETRON HCL 4 MG PO TABS
4.0000 mg | ORAL_TABLET | Freq: Four times a day (QID) | ORAL | Status: DC | PRN
Start: 2023-08-12 — End: 2023-08-15

## 2023-08-12 MED ORDER — IPRATROPIUM BROMIDE 0.02 % IN SOLN: 0.5000 mg | Freq: Four times a day (QID) | RESPIRATORY_TRACT | Status: DC

## 2023-08-12 MED ORDER — INSULIN ASPART 100 UNIT/ML IJ SOLN: 0.0000 [IU] | Freq: Three times a day (TID) | INTRAMUSCULAR | Status: DC

## 2023-08-12 MED ORDER — IPRATROPIUM-ALBUTEROL 0.5-2.5 (3) MG/3ML IN SOLN
3.0000 mL | Freq: Three times a day (TID) | RESPIRATORY_TRACT | Status: DC
Start: 2023-08-13 — End: 2023-08-13
  Administered 2023-08-13 (×2): 3 mL via RESPIRATORY_TRACT
  Filled 2023-08-12 (×4): qty 3

## 2023-08-12 MED ORDER — ENOXAPARIN SODIUM 30 MG/0.3ML IJ SOSY
30.0000 mg | PREFILLED_SYRINGE | INTRAMUSCULAR | Status: DC
  Administered 2023-08-13: 30 mg via SUBCUTANEOUS
  Filled 2023-08-12: qty 0.3

## 2023-08-12 MED ORDER — GUAIFENESIN ER 600 MG PO TB12
600.0000 mg | ORAL_TABLET | Freq: Two times a day (BID) | ORAL | Status: DC
  Administered 2023-08-13 – 2023-08-15 (×6): 600 mg via ORAL
  Filled 2023-08-12 (×6): qty 1

## 2023-08-12 MED ORDER — ONDANSETRON HCL 4 MG/2ML IJ SOLN: 4.0000 mg | Freq: Four times a day (QID) | INTRAMUSCULAR | Status: DC | PRN

## 2023-08-12 MED ORDER — INSULIN ASPART 100 UNIT/ML IJ SOLN: 0.0000 [IU] | Freq: Every day | INTRAMUSCULAR | Status: DC

## 2023-08-12 MED ORDER — ACETAMINOPHEN 325 MG PO TABS: 650.0000 mg | ORAL_TABLET | Freq: Four times a day (QID) | ORAL | Status: DC | PRN

## 2023-08-12 MED ORDER — SODIUM CHLORIDE 0.9 % IV BOLUS (SEPSIS)
1000.0000 mL | Freq: Once | INTRAVENOUS | Status: AC
  Administered 2023-08-12: 1000 mL via INTRAVENOUS

## 2023-08-12 MED ORDER — ALBUTEROL SULFATE (2.5 MG/3ML) 0.083% IN NEBU
2.5000 mg | INHALATION_SOLUTION | RESPIRATORY_TRACT | Status: DC | PRN
  Administered 2023-08-12: 2.5 mg via RESPIRATORY_TRACT
  Filled 2023-08-12: qty 3

## 2023-08-12 MED ORDER — SODIUM CHLORIDE 0.9 % IV SOLN
500.0000 mg | INTRAVENOUS | Status: DC
  Administered 2023-08-13 – 2023-08-14 (×2): 500 mg via INTRAVENOUS
  Filled 2023-08-12 (×2): qty 5

## 2023-08-12 MED ORDER — ACETAMINOPHEN 650 MG RE SUPP: 650.0000 mg | Freq: Four times a day (QID) | RECTAL | Status: DC | PRN

## 2023-08-12 NOTE — Sepsis Progress Note (Signed)
 Elink monitoring for the code sepsis protocol.

## 2023-08-12 NOTE — ED Notes (Signed)
 2 attempts to call [facility] to obtain more information on pt due to pt confusion. No answer from [facility]. Voicemail left

## 2023-08-12 NOTE — ED Provider Notes (Signed)
 [place] [facility] DEPARTMENT AT [facility] Provider Note   CSN: [id number] Arrival date & time: 08/12/23  1821     History  Chief Complaint  Patient presents with   Emesis    Matthew Calderon is a 89 y.o. male.  Patient here with cough weakness fever nausea vomiting diarrhea.  Has not felt well the last couple days.  Lives at an assisted living facility.  Been too weak to walk around with his walker.  Has not been able to eat or drink much.  History of depression anxiety diabetes.  Denies any weakness numbness tingling.  The history is provided by the patient.       Home Medications     Allergies    Alendronate sodium, Penicillin g, Penicillins, Sulfa antibiotics, and Sulfacetamide sodium    Review of Systems   Review of Systems  Physical Exam Updated Vital Signs BP 120/69   Pulse (!) 102   Temp 99.9 F (37.7 C) (Oral)   Resp 18   Ht 4\' 11"  (1.499 m)   Wt 69.7 kg   SpO2 97%   BMI 31.04 kg/m  Physical Exam Vitals and nursing note reviewed.  Constitutional:      General: He is not in acute distress.    Appearance: He is well-developed. He is not ill-appearing.  HENT:     Head: Normocephalic and atraumatic.     Nose: Nose normal.     Mouth/Throat:     Mouth: Mucous membranes are dry.  Eyes:     Extraocular Movements: Extraocular movements intact.     Conjunctiva/sclera: Conjunctivae normal.     Pupils: Pupils are equal, round, and reactive to light.  Cardiovascular:     Rate and Rhythm: Normal rate and regular rhythm.     Pulses: Normal pulses.     Heart sounds: Normal heart sounds. No murmur heard. Pulmonary:     Effort: Pulmonary effort is normal. No respiratory distress.     Breath sounds: Normal breath sounds.  Abdominal:     General: Abdomen is flat.     Palpations: Abdomen is soft.     Tenderness: There is no abdominal tenderness.  Musculoskeletal:        General: No swelling.     Cervical back: Normal range of motion and neck  supple.  Skin:    General: Skin is warm and dry.     Capillary Refill: Capillary refill takes less than 2 seconds.  Neurological:     Mental Status: He is alert.  Psychiatric:        Mood and Affect: Mood normal.     ED Results / Procedures / Treatments   Labs (all labs ordered are listed, but only abnormal results are displayed) Labs Reviewed  COMPREHENSIVE METABOLIC PANEL - Abnormal; Notable for the following components:      Result Value   CO2 17 (*)    Glucose, Bld 134 (*)    BUN 34 (*)    Creatinine, Ser 1.40 (*)    Calcium 8.3 (*)    Total Bilirubin 1.5 (*)    GFR, Estimated 48 (*)    All other components within normal limits  CBC WITH DIFFERENTIAL/PLATELET - Abnormal; Notable for the following components:   WBC 13.5 (*)    Hemoglobin 12.4 (*)    Neutro Abs 11.3 (*)    Lymphs Abs 0.6 (*)    Abs Immature Granulocytes 0.44 (*)    All other components within normal limits  URINALYSIS, W/ REFLEX TO CULTURE (INFECTION SUSPECTED) - Abnormal; Notable for the following components:   Glucose, UA >=500 (*)    Ketones, ur 20 (*)    Leukocytes,Ua TRACE (*)    All other components within normal limits  CBG MONITORING, ED - Abnormal; Notable for the following components:   Glucose-Capillary 120 (*)    All other components within normal limits  RESP PANEL BY RT-PCR (RSV, FLU A&B, COVID)  [id number]  CULTURE, BLOOD (ROUTINE X 2)  CULTURE, BLOOD (ROUTINE X 2)  I-STAT CG4 LACTIC ACID, ED    EKG None  Radiology DG Chest Port 1 View Result Date: 08/12/2023 CLINICAL DATA:  [id number]. Fever due to infection. Nausea, vomiting for few days. Flu and COVID negative. EXAM: PORTABLE CHEST 1 VIEW COMPARISON:  Portable chest 04/09/2021 FINDINGS: Lungs are expiratory. There is asymmetric increased opacity left lower lung field consistent with patchy pneumonic infiltrate. Remainder of the aerated lungs are visually clear but with limited view of the bases. The cardiomediastinal silhouette is  stable. No vascular congestion is seen. There is mild aortic atherosclerosis and uncoiling. There is no substantial pleural effusion. There are chronic healed fractures of the right ribcage and bilateral moderate shoulder DJD, chronic rotator cuff arthropathy. IMPRESSION: 1. Patchy pneumonic infiltrate left lower lung field. Follow-up study recommended after treatment to ensure clearing. 2. Expiratory chest with no further acute radiographic findings. 3. Aortic atherosclerosis. Electronically Signed   By: Almira Bar M.D.   On: 08/12/2023 20:11    Procedures .Critical Care  Performed by: Virgina Norfolk, DO Authorized by: Virgina Norfolk, DO   Critical care provider statement:    Critical care time (minutes):  35   Critical care was necessary to treat or prevent imminent or life-threatening deterioration of the following conditions:  Sepsis   Critical care was time spent personally by me on the following activities:  Blood draw for specimens, development of treatment plan with patient or surrogate, discussions with primary provider, evaluation of patient's response to treatment, examination of patient, obtaining history from patient or surrogate, ordering and performing treatments and interventions, ordering and review of laboratory studies, ordering and review of radiographic studies, pulse oximetry, re-evaluation of patient's condition and review of old charts   Care discussed with: admitting provider       Medications Ordered in ED Medications  sodium chloride 0.9 % bolus 1,000 mL (has no administration in time range)  levofloxacin (LEVAQUIN) IVPB 750 mg (has no administration in time range)    ED Course/ Medical Decision Making/ A&P                                 Medical Decision Making Amount and/or Complexity of Data Reviewed Labs: ordered.  Risk Prescription drug management. Decision regarding hospitalization.   Matthew Calderon is here with cough nausea vomiting diarrhea  generalized weakness.  He arrives with low-grade fever tachycardia.  He had lab work done prior to my evaluation including chest x-ray.  But now that he has a white count per my review and interpretation pneumonia on chest x-ray we will make a code sepsis start broad-spectrum IV antibiotics IV fluids.  He is not been well the last couple days.  History and physical seems consistent with pneumonia likely secondary to viral process.  Cynically he looks dehydrated.  COVID and flu and RSV test are negative.  Per my review and interpretation of labs creatinine is mildly  elevated at 1.4.  Chest x-ray consistent with pneumonia.  Lactic acid is normal.  Does have a leukocytosis of 13.5.  Urinalysis negative for infection.  Overall we will admit for further sepsis workup.  Hemodynamically stable throughout my care.  This chart was dictated using voice recognition software.  Despite best efforts to proofread,  errors can occur which can change the documentation meaning.    Final Clinical Impression(s) / ED Diagnoses Final diagnoses:  Sepsis, due to unspecified organism, unspecified whether acute organ dysfunction present [facility])  Community acquired pneumonia, unspecified laterality    Rx / DC Orders ED Discharge Orders     None         Virgina Norfolk, DO 08/12/23 2154

## 2023-08-12 NOTE — H&P (Signed)
 History and Physical    Patient: Matthew Calderon ZOX:[id number] DOB: 05-28-1933 DOA: 08/12/2023 DOS: the patient was seen and examined on 08/12/2023 PCP: Eloisa Northern, MD  Patient coming from: Home  Chief Complaint:  Chief Complaint  Patient presents with   Emesis   HPI: Matthew Calderon is a 89 y.o. male with medical history significant of GERD, anxiety disorder, history of hiatal hernia, postnasal drip, obstructive sleep apnea who is from assisted living facility presenting with nausea with vomiting.  Also diarrhea.  Patient has not felt good for last couple of days.  He has been weak.  Finding difficult to walk around with his walker.  Patient also has history of depression with anxiety is.  Also has diabetes.  He was seen evaluated in the ER.  Continues to be weak.  Also low-grade fever tachycardia.  He meets sepsis criteria with a tachycardia and leukocytosis as well as pneumonia.  Patient will be admitted with sepsis secondary to pneumonia.  Continue to admit.  Review of Systems: As mentioned in the history of present illness. All other systems reviewed and are negative. Past Medical History:  Diagnosis Date   Anemia    takes iron pill   Anxiety    Arthritis    Depression    Diabetes mellitus without complication (HCC)    GERD (gastroesophageal reflux disease)    pepto   H/O hiatal hernia    Pneumonia    4 years ago   PONV (postoperative nausea and vomiting) 05/14/2019   Post-nasal drip    Prostate hyperplasia, benign localized, without urinary obstruction    Seasonal allergies    Sleep apnea    no CPAP   Past Surgical History:  Procedure Laterality Date   BACK SURGERY  1995   discectomy   COLONOSCOPY W/ BIOPSIES AND POLYPECTOMY     EYE SURGERY Bilateral    cataracts   EYE SURGERY Bilateral    glaucoma shunts   JOINT REPLACEMENT Left 1992   knee   JOINT REPLACEMENT Right 1996   knee   JOINT REPLACEMENT Right 2009   hip   JOINT REPLACEMENT Left 2005   hip   KNEE  ARTHROSCOPY Left 1981   KNEE ARTHROSCOPY WITH PATELLA RECONSTRUCTION Right 2010   KNEE SURGERY Bilateral 1954   knee surgery and placed in a cast for 6 weeks   KNEE SURGERY  1961   cartilage removed   LEG SURGERY Bilateral 1974, 1975   straighten legs   SHOULDER OPEN ROTATOR CUFF REPAIR Right 2010   TONSILLECTOMY     TOTAL HIP ARTHROPLASTY Right 01/13/2014   Procedure: TOTAL HIP ARTHROPLASTY ANTERIOR APPROACH;  Surgeon: Velna Ochs, MD;  Location: [facility] OR;  Service: Orthopedics;  Laterality: Right;   Social History:  reports that he quit smoking about 47 years ago. His smoking use included cigarettes. He started smoking about 65 years ago. He has a 18 pack-year smoking history. He has never used smokeless tobacco. He reports current alcohol use. He reports that he does not use drugs.  Allergies  Allergen Reactions   Alendronate Sodium     Other reaction(s): diarrhea   Penicillin G     Other reaction(s): Unknown   Penicillins Hives    DID THE REACTION INVOLVE: Swelling of the face/tongue/throat, SOB, or low BP? No Sudden or severe rash/hives, skin peeling, or the inside of the mouth or nose? No Did it require medical treatment? No When did it last happen?childhood allergy  If all above answers are "NO", may proceed with cephalosporin use.    Sulfa Antibiotics Hives   Sulfacetamide Sodium     Other reaction(s): Unknown    Family History  Problem Relation Age of Onset   Arthritis Mother    Arthritis Father     Prior to Admission medications   Medication Sig Start Date End Date Taking? Authorizing Provider  aspirin 81 MG chewable tablet Chew 81 mg by mouth daily. 01/14/20   [provider]  aspirin 81 MG chewable tablet 1 tablet    [provider]  aspirin EC 81 MG EC tablet Take 1 tablet (81 mg total) by mouth daily. 06/27/18   Elgergawy, Leana Roe, MD  atorvastatin (LIPITOR) 40 MG tablet Take 1 tablet (40 mg total) by mouth daily at 6 PM. Patient taking  differently: Take 40 mg by mouth every evening. 06/26/18   Elgergawy, Leana Roe, MD  atorvastatin (LIPITOR) 40 MG tablet 1 tablet    [provider]  brimonidine (ALPHAGAN) 0.2 % ophthalmic solution Place 1 drop into both eyes 2 (two) times daily. 09/22/17   [provider]  carvedilol (COREG) 12.5 MG tablet Take 1 tablet (12.5 mg total) by mouth 2 (two) times daily with a meal. 06/26/18   Elgergawy, Leana Roe, MD  carvedilol (COREG) 6.25 MG tablet TAKE 1 TABLET BY MOUTH TWICE DAILY *TAKE WITH FOOD*    [provider]  cetirizine (ZYRTEC) 10 MG tablet 1 tablet    [provider]  Cholecalciferol (D3-1000) 25 MCG (1000 UT) capsule 2 tablet    [provider]  Cholecalciferol 25 MCG (1000 UT) tablet Take 2,000 Units by mouth every morning.     [provider]  Empagliflozin-Linaglip-Metform (TRIJARDY XR) 25-10-998 MG TB24 1 tablet with the morning meal    [provider]  famotidine (PEPCID) 20 MG tablet  03/13/20   [provider]  famotidine (PEPCID) 20 MG tablet 1 tablet at bedtime as needed 04/10/20   [provider]  Ferrous Sulfate 143 (45 FE) MG TBCR Take 1 tablet by mouth daily.    [provider]  Ferrous Sulfate Dried 143 (45 Fe) MG TBCR ferrous sulfate ER 143 mg (45 mg iron) tablet,extended release   1 by oral route.    [provider]  finasteride (PROSCAR) 5 MG tablet Take 5 mg by mouth every evening.  03/11/19   [provider]  finasteride (PROSCAR) 5 MG tablet 1 tablet 08/03/18   [provider]  fluticasone (FLONASE) 50 MCG/ACT nasal spray 2 spray in each nostril 01/25/21   [provider]  furosemide (LASIX) 20 MG tablet Take 20 mg by mouth daily as needed for fluid or edema.    [provider]  gentamicin ointment (GARAMYCIN) 0.1 %  11/19/21   [provider]  insulin aspart (NOVOLOG) 100 UNIT/ML injection insulin aspart U-100  100 unit/mL subcutaneous  solution   0 units by sub-q route. 01/14/20   [provider]  Melatonin 10 MG TBCR Take 1 tablet by mouth at bedtime.    [provider]  metFORMIN (GLUCOPHAGE) 500 MG tablet Take 500 mg by mouth every evening.    [provider]  metFORMIN (GLUCOPHAGE-XR) 500 MG 24 hr tablet  03/02/20   [provider]  mirabegron ER (MYRBETRIQ) 50 MG TB24 tablet 1 tablet    [provider]  Multiple Vitamins-Minerals (PRESERVISION AREDS 2 PO) 1 capsule    [provider]  Multiple  Vitamins-Minerals (PRESERVISION AREDS 2) CAPS Take 1 capsule by mouth 2 (two) times daily.     [provider]  nitrofurantoin, macrocrystal-monohydrate, (MACROBID) 100 MG capsule Take 100 mg by mouth 2 (two) times daily. 09/04/21   [provider]  ondansetron (ZOFRAN) 4 MG tablet  02/14/20   [provider]  potassium chloride (K-DUR,KLOR-CON) 10 MEQ tablet Take 1 tablet (10 mEq total) by mouth daily. 06/27/18   Elgergawy, Leana Roe, MD  potassium chloride (KLOR-CON) 10 MEQ tablet 1 tablet    [provider]  RA MELATONIN 10 MG TABS Take 1 tablet by mouth at bedtime. 01/14/20   [provider]  sacubitril-valsartan (ENTRESTO) 24-26 MG 1    [provider]  sertraline (ZOLOFT) 50 MG tablet Take 50 mg by mouth every evening.    [provider]  sertraline (ZOLOFT) 50 MG tablet 0.5 tablet 04/10/20   [provider]  SYNJARDY XR 25-1000 MG TB24 Take 1 tablet by mouth daily.  04/22/19   [provider]  tamsulosin (FLOMAX) 0.4 MG CAPS capsule 1 capsule    [provider]  timolol (TIMOPTIC) 0.5 % ophthalmic solution Place 1 drop into both eyes 2 (two) times daily. 09/22/17   [provider]  timolol (TIMOPTIC) 0.5 % ophthalmic solution 1 drop into each eye    [provider]  traMADol (ULTRAM) 50 MG tablet Take 1 tablet (50 mg total) by mouth every 6 (six) hours as needed. 01/18/20    Devoria Albe, MD  traZODone (DESYREL) 50 MG tablet Take 50 mg by mouth at bedtime. 03/19/18   [provider]    Physical Exam: Vitals:   08/12/23 1833 08/12/23 1852 08/12/23 2030  BP: (!) 120/58  120/69  Pulse: 98  (!) 102  Resp: 18  18  Temp: 99.9 F (37.7 C)    TempSrc: Oral    SpO2: 94%  97%  Weight:  69.7 kg   Height:  4\' 11"  (1.499 m)    Constitutional: Acutely ill looking, NAD, calm, comfortable Eyes: PERRL, lids and conjunctivae normal ENMT: Mucous membranes are moist. Posterior pharynx clear of any exudate or lesions.Normal dentition.  Neck: normal, supple, no masses, no thyromegaly Respiratory: Decreased air entry bilaterally, coarse breath sound, no accessory muscle use.  Cardiovascular: Regular rate and rhythm, no murmurs / rubs / gallops. No extremity edema. 2+ pedal pulses. No carotid bruits.  Abdomen: no tenderness, no masses palpated. No hepatosplenomegaly. Bowel sounds positive.  Musculoskeletal: Good range of motion, no joint swelling or tenderness, Skin: no rashes, lesions, ulcers. No induration Neurologic: CN 2-12 grossly intact. Sensation intact, DTR normal. Strength 5/5 in all 4.  Psychiatric: Normal judgment and insight. Alert and oriented x 3. Normal mood  Data Reviewed:  Temperature 99.9, blood pressure 120/69, pulse 102 respirate 18 oxygen sat 97% on room air, white count 13.5, hemoglobin 12.4, platelets 198, sodium 136 potassium 4.0 chloride 105 CO2 17 BUN 34 creatinine 1.40 calcium 8.3.  Acute viral screen negative urinalysis negative chest x-ray showed patchy pneumonic infiltrate left lower lung fields.  Assessment and Plan:  #1 sepsis due to pneumonia: Patient will be admitted.  Initiate sepsis protocol with fluids IV.  Antibiotics and cultures.  Blood cultures will be obtained.  Will continue oxygenation as needed.  #2 community-acquired pneumonia: Patient has had viral illness but does not have any sign of flu or COVID tests are negative.   Continue symptomatic care  #3 diabetes: Initiate sliding scale insulin  #4 GERD:  Continue PPIs.  #5 obstructive sleep apnea: Offer CPAP at night  #6 history of tobacco abuse: Continue supportive care and counseling.  Possibly nicotine patch if needed.  #7 essential hypertension:    Advance Care Planning:   Code Status: Full Code   Consults: None  Family Communication: No family at bedside  Severity of Illness: The appropriate patient status for this patient is INPATIENT. Inpatient status is judged to be reasonable and necessary in order to provide the required intensity of service to ensure the patient's safety. The patient's presenting symptoms, physical exam findings, and initial radiographic and laboratory data in the context of their chronic comorbidities is felt to place them at high risk for further clinical deterioration. Furthermore, it is not anticipated that the patient will be medically stable for discharge from the hospital within 2 midnights of admission.   * I certify that at the point of admission it is my clinical judgment that the patient will require inpatient hospital care spanning beyond 2 midnights from the point of admission due to high intensity of service, high risk for further deterioration and high frequency of surveillance required.*  AuthorLonia Blood, MD 08/12/2023 9:50 PM  For on call review [email or web address].

## 2023-08-12 NOTE — ED Triage Notes (Signed)
 BIB EMS from [facility] community for N/V/D for a few days. Flu and covid negative a few days ago, pt it still having sx. Pt is alert and orientedx3, but has intermittent confusion and is answering questions inappropriately.

## 2023-08-13 DIAGNOSIS — A419 Sepsis, unspecified organism: Secondary | ICD-10-CM | POA: Diagnosis not present

## 2023-08-13 DIAGNOSIS — J189 Pneumonia, unspecified organism: Secondary | ICD-10-CM | POA: Diagnosis not present

## 2023-08-13 LAB — GLUCOSE, CAPILLARY
Glucose-Capillary: 77 mg/dL (ref 70–99)
Glucose-Capillary: 96 mg/dL (ref 70–99)
Glucose-Capillary: 89 mg/dL (ref 70–99)
Glucose-Capillary: 83 mg/dL (ref 70–99)

## 2023-08-13 LAB — CBC
HCT: 34.6 % — ABNORMAL LOW (ref 39.0–52.0)
Hemoglobin: 11.3 g/dL — ABNORMAL LOW (ref 13.0–17.0)
MCH: 29.4 pg (ref 26.0–34.0)
MCHC: 32.7 g/dL (ref 30.0–36.0)
MCV: 89.9 fL (ref 80.0–100.0)
Platelets: 170 10*3/uL (ref 150–400)
RBC: 3.85 MIL/uL — ABNORMAL LOW (ref 4.22–5.81)
RDW: 14.2 % (ref 11.5–15.5)
WBC: 11.8 10*3/uL — ABNORMAL HIGH (ref 4.0–10.5)
nRBC: 0 % (ref 0.0–0.2)

## 2023-08-13 LAB — CORTISOL-AM, BLOOD: Cortisol - AM: 16.6 ug/dL (ref 6.7–22.6)

## 2023-08-13 LAB — CREATININE, SERUM
Creatinine, Ser: 1.25 mg/dL — ABNORMAL HIGH (ref 0.61–1.24)
GFR, Estimated: 55 mL/min — ABNORMAL LOW (ref >60)

## 2023-08-13 LAB — PROTIME-INR
INR: 1.4 — ABNORMAL HIGH (ref 0.8–1.2)
Prothrombin Time: 17.1 s — ABNORMAL HIGH (ref 11.4–15.2)

## 2023-08-13 LAB — HEMOGLOBIN A1C
Hgb A1c MFr Bld: 8 % — ABNORMAL HIGH (ref 4.8–5.6)
Mean Plasma Glucose: 182.9 mg/dL

## 2023-08-13 MED ORDER — FINASTERIDE 5 MG PO TABS
5.0000 mg | ORAL_TABLET | Freq: Every evening | ORAL | Status: DC
  Administered 2023-08-13 – 2023-08-14 (×2): 5 mg via ORAL
  Filled 2023-08-13 (×2): qty 1

## 2023-08-13 MED ORDER — VITAMIN D 25 MCG (1000 UNIT) PO TABS
2000.0000 [IU] | ORAL_TABLET | Freq: Every day | ORAL | Status: DC
  Administered 2023-08-14 – 2023-08-15 (×2): 2000 [IU] via ORAL
  Filled 2023-08-13 (×2): qty 2

## 2023-08-13 MED ORDER — PROSIGHT PO TABS
1.0000 | ORAL_TABLET | Freq: Two times a day (BID) | ORAL | Status: DC
  Administered 2023-08-13 – 2023-08-15 (×4): 1 via ORAL
  Filled 2023-08-13 (×4): qty 1

## 2023-08-13 MED ORDER — IPRATROPIUM-ALBUTEROL 0.5-2.5 (3) MG/3ML IN SOLN
3.0000 mL | Freq: Two times a day (BID) | RESPIRATORY_TRACT | Status: DC
  Administered 2023-08-14 – 2023-08-15 (×3): 3 mL via RESPIRATORY_TRACT
  Filled 2023-08-13 (×3): qty 3

## 2023-08-13 MED ORDER — TIMOLOL MALEATE 0.5 % OP SOLN
1.0000 [drp] | Freq: Two times a day (BID) | OPHTHALMIC | Status: DC
  Administered 2023-08-13 – 2023-08-15 (×4): 1 [drp] via OPHTHALMIC
  Filled 2023-08-13: qty 5

## 2023-08-13 MED ORDER — ENOXAPARIN SODIUM 40 MG/0.4ML IJ SOSY
40.0000 mg | PREFILLED_SYRINGE | INTRAMUSCULAR | Status: DC
  Administered 2023-08-14 – 2023-08-15 (×2): 40 mg via SUBCUTANEOUS
  Filled 2023-08-13 (×2): qty 0.4

## 2023-08-13 MED ORDER — BRIMONIDINE TARTRATE 0.2 % OP SOLN
1.0000 [drp] | Freq: Two times a day (BID) | OPHTHALMIC | Status: DC
  Administered 2023-08-13 – 2023-08-15 (×4): 1 [drp] via OPHTHALMIC
  Filled 2023-08-13: qty 5

## 2023-08-13 MED ORDER — MIRABEGRON ER 25 MG PO TB24
25.0000 mg | ORAL_TABLET | Freq: Every day | ORAL | Status: DC
  Administered 2023-08-13 – 2023-08-15 (×3): 25 mg via ORAL
  Filled 2023-08-13 (×4): qty 1

## 2023-08-13 MED ORDER — BENZONATATE 100 MG PO CAPS
100.0000 mg | ORAL_CAPSULE | Freq: Once | ORAL | Status: AC
  Administered 2023-08-13: 100 mg via ORAL
  Filled 2023-08-13: qty 1

## 2023-08-13 MED ORDER — TAMSULOSIN HCL 0.4 MG PO CAPS
0.4000 mg | ORAL_CAPSULE | Freq: Every day | ORAL | Status: DC
  Administered 2023-08-13 – 2023-08-14 (×2): 0.4 mg via ORAL
  Filled 2023-08-13 (×2): qty 1

## 2023-08-13 MED ORDER — ASPIRIN 81 MG PO CHEW
81.0000 mg | CHEWABLE_TABLET | Freq: Every day | ORAL | Status: DC
Start: 2023-08-13 — End: 2023-08-15
  Administered 2023-08-13 – 2023-08-15 (×3): 81 mg via ORAL
  Filled 2023-08-13 (×3): qty 1

## 2023-08-13 MED ORDER — NYSTATIN 100000 UNIT/GM EX POWD
Freq: Two times a day (BID) | CUTANEOUS | Status: DC
  Administered 2023-08-14: 1 via TOPICAL
  Filled 2023-08-13: qty 15

## 2023-08-13 MED ORDER — SERTRALINE HCL 50 MG PO TABS
50.0000 mg | ORAL_TABLET | Freq: Every evening | ORAL | Status: DC
  Administered 2023-08-13 – 2023-08-14 (×2): 50 mg via ORAL
  Filled 2023-08-13 (×2): qty 1

## 2023-08-13 MED ORDER — INSULIN ASPART 100 UNIT/ML IJ SOLN
0.0000 [IU] | Freq: Three times a day (TID) | INTRAMUSCULAR | Status: DC
  Administered 2023-08-14 (×2): 2 [IU] via SUBCUTANEOUS
  Administered 2023-08-15: 1 [IU] via SUBCUTANEOUS

## 2023-08-13 MED ORDER — LORATADINE 10 MG PO TABS
10.0000 mg | ORAL_TABLET | Freq: Every day | ORAL | Status: DC
  Administered 2023-08-13 – 2023-08-15 (×3): 10 mg via ORAL
  Filled 2023-08-13 (×3): qty 1

## 2023-08-13 MED ORDER — FERROUS SULFATE 325 (65 FE) MG PO TABS
325.0000 mg | ORAL_TABLET | Freq: Every day | ORAL | Status: DC
  Administered 2023-08-13 – 2023-08-15 (×3): 325 mg via ORAL
  Filled 2023-08-13 (×3): qty 1

## 2023-08-13 MED ORDER — MELATONIN 5 MG PO TABS
10.0000 mg | ORAL_TABLET | Freq: Every day | ORAL | Status: DC
  Administered 2023-08-13 – 2023-08-14 (×2): 10 mg via ORAL
  Filled 2023-08-13 (×2): qty 2

## 2023-08-13 MED ORDER — CLOTRIMAZOLE 1 % EX CREA
TOPICAL_CREAM | Freq: Two times a day (BID) | CUTANEOUS | Status: DC
  Filled 2023-08-13: qty 15

## 2023-08-13 MED ORDER — ATORVASTATIN CALCIUM 40 MG PO TABS
40.0000 mg | ORAL_TABLET | Freq: Every evening | ORAL | Status: DC
  Administered 2023-08-13 – 2023-08-14 (×2): 40 mg via ORAL
  Filled 2023-08-13 (×2): qty 1

## 2023-08-13 NOTE — Hospital Course (Addendum)
 89 y.o. male  with history of GERD, anxiety disorder, diabetes,anxiety/depression hiatal hernia OSA not on CPAP resident of ALF presented with fever, cough nausea vomiting diarrhea for a few days.  Patient also having weakness difficulty walking with walker has been not been able to eat or drink well. In the ED vitals stable afebrile.  Labs elevated BUN/creatinine 1.4, bicarb 17 leukocytosis 13.5 influenza A COVID and RSV negative, UA unremarkable. CXR>> pathognomonic infiltrate  LLL, will start on antibiotics and admitted

## 2023-08-13 NOTE — TOC Initial Note (Signed)
 Transition of Care [facility]) - Initial/Assessment Note   Patient Details  Name: Matthew Calderon MRN: [id number] Date of Birth: Sep 19, 1932  Transition of Care [facility]) CM/SW Contact:    Ewing Schlein, LCSW Phone Number: 08/13/2023, 12:27 PM  Clinical Narrative: Patient is currently oriented x2. CSW spoke with patient's daughter, Armstead Peaks. Daughter confirmed patient resides at [facility], which he has been living in since September 2024. Daughter reported patient has some baseline confusion and may not understand his current situation in the hospital. Patient is expected to discharge back to [facility] when medically ready. FL2 started. TOC to follow.                 Expected Discharge Plan: Assisted Living Barriers to Discharge: Continued Medical Work up  Patient Goals and CMS Choice Patient states their goals for this hospitalization and ongoing recovery are:: Return to [facility]  Expected Discharge Plan and Services In-house Referral: Clinical Social Work Living arrangements for the past 2 months: Assisted Living Facility            DME Arranged: N/A DME Agency: NA  Prior Living Arrangements/Services Living arrangements for the past 2 months: Assisted Living Facility Lives with:: Facility Resident Patient language and need for interpreter reviewed:: Yes Do you feel safe going back to the place where you live?: Yes      Need for Family Participation in Patient Care: Yes (Comment) (Patient is oriented x2.) Care giver support system in place?: Yes (comment) Criminal Activity/Legal Involvement Pertinent to Current Situation/Hospitalization: No - Comment as needed  Activities of Daily Living ADL Screening (condition at time of admission) Independently performs ADLs?: No Does the patient have a NEW difficulty with bathing/dressing/toileting/self-feeding that is expected to last >3 days?: Yes (Initiates electronic notice to provider for possible OT consult) Does the patient have a  NEW difficulty with getting in/out of bed, walking, or climbing stairs that is expected to last >3 days?: Yes (Initiates electronic notice to provider for possible PT consult) Does the patient have a NEW difficulty with communication that is expected to last >3 days?: No Is the patient deaf or have difficulty hearing?: Yes Does the patient have difficulty seeing, even when wearing glasses/contacts?: Yes Does the patient have difficulty concentrating, remembering, or making decisions?: Yes  Permission Sought/Granted Permission sought to share information with : Facility Industrial/product designer granted to share information with : Yes, Verbal Permission Granted Permission granted to share info w AGENCY: [facility]  Emotional Assessment Appearance:: Appears stated age Orientation: : Oriented to Self, Oriented to Place Alcohol / Substance Use: Not Applicable Psych Involvement: No (comment)  Admission diagnosis:  Sepsis due to pneumonia (HCC) [J18.9, A41.9] Community acquired pneumonia, unspecified laterality [J18.9] Sepsis, due to unspecified organism, unspecified whether acute organ dysfunction present [facility]) [A41.9] Patient Active Problem List   Diagnosis Date Noted   Sepsis due to pneumonia (HCC) 08/12/2023   Age-related osteoporosis without current pathological fracture 03/17/2021   Penicillin allergy 03/17/2021   Androgen deficiency 03/17/2021   Disorder of lipid metabolism 03/17/2021   Diverticular disease of colon 03/17/2021   Drowsiness 03/17/2021   Encounter for general adult medical examination with abnormal findings 03/17/2021   Essential hypertension 03/17/2021   Gout 03/17/2021   History of psychiatric disorder 03/17/2021   Hypocalcemia 03/17/2021   Intertrigo 03/17/2021   Iron deficiency anemia 03/17/2021   Macular degeneration 03/17/2021   Peripheral arterial disease (HCC) 03/17/2021   Physical deconditioning 03/17/2021   Scoliosis 03/17/2021   Unequal  leg  length 03/17/2021   Vitamin B12 deficiency 03/17/2021   Vitamin D deficiency 03/17/2021   Macular pucker of both eyes 11/07/2020   Retinal edema 11/07/2020   Fall 01/14/2020   Chronic diastolic CHF (congestive heart failure) (HCC) 01/12/2020   Multiple rib fractures 01/11/2020   Dysequilibrium 01/11/2020   Fall at home, initial encounter 01/11/2020   Head trauma, subsequent encounter 01/11/2020   Arthritis 05/14/2019   Enlarged prostate 05/14/2019   History of tobacco abuse 05/14/2019   Interstitial cystitis 05/14/2019   Obesity 05/14/2019   OSA (obstructive sleep apnea) 05/14/2019   PONV (postoperative nausea and vomiting) 05/14/2019   Post-nasal drip 05/14/2019   Seasonal allergies 05/14/2019   Chronic cough 07/21/2018   GERD (gastroesophageal reflux disease) 07/21/2018   NSTEMI (non-ST elevated myocardial infarction) (HCC) 06/20/2018   Prostate hyperplasia, benign localized, without urinary obstruction    Hyperglycemia    Syncope, cardiogenic    Degenerative joint disease (DJD) of hip 01/13/2014   Diabetes (HCC) 01/13/2014   Pseudophakia, both eyes 12/01/2013   Nonexudative age-related macular degeneration 01/16/2013   Primary open angle glaucoma of right eye, severe stage 01/13/2013   Allergic rhinitis 09/17/2010   Anemia, unspecified 09/17/2010   Gastro-esophageal reflux disease with esophagitis 12/27/2009   Other and unspecified hyperlipidemia 12/27/2009   Hypertrophy of prostate without urinary obstruction and other lower urinary tract symptoms (LUTS) 07/11/2009   Insomnia 07/11/2009   Osteoporosis 07/11/2009   Other malaise and fatigue 06/15/2008   Depressive disorder, not elsewhere classified 05/04/2008   Unspecified glaucoma 05/04/2008   PCP:  Eloisa Northern, MD Pharmacy:   [facility] - [place], [place] - [address] [place] [address] Phone: [phone] Fax: [phone]  Social Drivers of Health (SDOH) Social History: SDOH Screenings    Food Insecurity: No Food Insecurity (08/12/2023)  Housing: Low Risk  (08/12/2023)  Transportation Needs: No Transportation Needs (08/12/2023)  Utilities: Not At Risk (08/12/2023)  Social Connections: Unknown (08/12/2023)  Tobacco Use: Medium Risk (08/12/2023)   SDOH Interventions:    Readmission Risk Interventions     No data to display

## 2023-08-13 NOTE — Progress Notes (Addendum)
 PROGRESS NOTE Matthew Calderon  ZOX:[id number] DOB: Mar 08, 1933 DOA: 08/12/2023 PCP: Eloisa Northern, MD  Brief Narrative/Hospital Course: 89 y.o. male  with history of GERD, anxiety disorder, diabetes,anxiety/depression hiatal hernia OSA not on CPAP resident of ALF presented with fever, cough nausea vomiting diarrhea for a few days.  Patient also having weakness difficulty walking with walker has been not been able to eat or drink well. In the ED vitals stable afebrile.  Labs elevated BUN/creatinine 1.4, bicarb 17 leukocytosis 13.5 influenza A COVID and RSV negative, UA unremarkable. CXR>> pathognomonic infiltrate  LLL, will start on antibiotics and admitted     Subjective: On RA, coughing, alert awake. Overnight afebrile BP stable Labs reviewed creatinine further down 1.2, WBC improving   Assessment and Plan: Principal Problem:   Sepsis due to pneumonia (HCC) Active Problems:   Diabetes (HCC)   GERD (gastroesophageal reflux disease)   Gastro-esophageal reflux disease with esophagitis   History of tobacco abuse   OSA (obstructive sleep apnea)   Essential hypertension   LLL pneumonia Sepsis POA-met criteria with tachycardia 102 leukocytosis pneumonia: Patient presented with respiratory symptoms, labs remarkable for leukocytosis,cxr personally reviewed and shows pneumonia.Follow-up blood culture and sputum culture.continue ceftriaxone, azithromycin antitussives. Recent Labs  Lab 08/12/23 1854 08/12/23 1909 08/13/23 0117  WBC 13.5*  --  11.8*  LATICACIDVEN  --  1.4  --     AKI with mild metabolic acidosis: Elevated creatinine 1.4 on admission baseline around 0.9-1.  Encourage PO intake.  Holding Entresto/Lasix Recent Labs    08/12/23 1854 08/13/23 0117  BUN 34*  --   CREATININE 1.40* 1.25*  CO2 17*  --   K 4.0  --    Chronic CHF with preserved EF: In the remote past had reduced EF.  Med rec pending but will hold Entresto/Lasix due to AKI.  Monitor fluid status  T2DM: A1c 8.1  blood sugar stable keep on SSI.  Holding home meds Recent Labs  Lab 08/12/23 1914 08/12/23 2331 08/13/23 0117 08/13/23 0727  GLUCAP 120* 153*  --  77  HGBA1C  --   --  8.0*  --     GERD: Continue PPI  Tobacco abuse: Cessation counseling and nicotine patch  Essential HTN: BP stable.  Med rec pending. Meds on hold for now.  HLD: Resume statin  Med rec pending to review medications  Class I Obesity OSA not using CPAP: Will benefit with PCP follow-up, weight loss  healthy lifestyle and outpatient sleep evaluation.   DVT prophylaxis: enoxaparin (LOVENOX) injection 40 mg Start: 08/14/23 1000 Code Status:   Code Status: Full Code Family Communication: plan of care discussed with patient/none at bedside. Daughter updated on phone Patient status is: Remains hospitalized because of severity of illness Level of care: Med-Surg   Dispo: The patient is from: ALF.            Anticipated disposition: TBD. PTOT consulted Objective: Vitals last 24 hrs: Vitals:   08/12/23 2030 08/12/23 2325 08/12/23 2349 08/13/23 0629  BP: 120/69 (!) 105/56  113/60  Pulse: (!) 102 100  95  Resp: 18 20  20   Temp:  99.6 F (37.6 C)  98.2 F (36.8 C)  TempSrc:  Oral  Oral  SpO2: 97% 96% 95% 96%  Weight:      Height:       Weight change:   Physical Examination: General exam: alert awake,at baseline, older than stated age HEENT:Oral mucosa moist, Ear/Nose WNL grossly Respiratory system: Bilaterally scattered crackles and coughs with  deep breathing Cardiovascular system: S1 & S2 +, No JVD. Gastrointestinal system: Abdomen soft,NT,ND, BS+ Nervous System: Alert, awake, moving all extremities,and following commands. Extremities: LE edema neg,distal peripheral pulses palpable and warm.  Skin: No rashes,no icterus. MSK: Normal muscle bulk,tone, power   Medications reviewed:  Scheduled Meds:  aspirin  81 mg Oral Daily   atorvastatin  40 mg Oral QPM   brimonidine  1 drop Both Eyes BID   [START  ON 08/14/2023] Cholecalciferol  2,000 Units Oral BH-q7a   clotrimazole   Topical BID   [START ON 08/14/2023] enoxaparin (LOVENOX) injection  40 mg Subcutaneous Q24H   finasteride  5 mg Oral QPM   guaiFENesin  600 mg Oral BID   insulin aspart  0-15 Units Subcutaneous TID WC   insulin aspart  0-5 Units Subcutaneous QHS   ipratropium-albuterol  3 mL Nebulization TID   Melatonin  1 tablet Oral QHS   mirabegron ER  25 mg Oral Daily   nystatin   Topical BID   sertraline  50 mg Oral QPM   tamsulosin  0.4 mg Oral QPC supper   timolol  1 drop Both Eyes BID   Continuous Infusions:  azithromycin     cefTRIAXone (ROCEPHIN)  IV 2 g (08/13/23 0046)   lactated ringers 150 mL/hr (08/13/23 4098)    Diet Order             Diet heart healthy/carb modified Room service appropriate? Yes; Fluid consistency: Thin  Diet effective now                  Intake/Output Summary (Last 24 hours) at 08/13/2023 1150 Last data filed at 08/13/2023 0900 Gross per 24 hour  Intake 220 ml  Output 600 ml  Net -380 ml   Net IO Since Admission: -380 mL [08/13/23 1150]  Wt Readings from Last 3 Encounters:  08/12/23 69.7 kg  05/10/21 69.7 kg  04/09/21 68 kg     Unresulted Labs (From admission, onward)     Start     Ordered   08/19/23 0500  Creatinine, serum  (enoxaparin (LOVENOX)    CrCl >/= 30 ml/min)  Weekly,   R     Comments: while on enoxaparin therapy    08/12/23 2322   08/14/23 0500  Basic metabolic panel  Daily,   R      08/13/23 0758           Data Reviewed: I have personally reviewed following labs and imaging studies CBC: Recent Labs  Lab 08/12/23 1854 08/13/23 0117  WBC 13.5* 11.8*  NEUTROABS 11.3*  --   HGB 12.4* 11.3*  HCT 39.1 34.6*  MCV 91.1 89.9  PLT 198 170   Basic Metabolic Panel:  Recent Labs  Lab 08/12/23 1854 08/13/23 0117  NA 136  --   K 4.0  --   CL 105  --   CO2 17*  --   GLUCOSE 134*  --   BUN 34*  --   CREATININE 1.40* 1.25*  CALCIUM 8.3*  --    GFR:  Estimated Creatinine Clearance: 32.6 mL/min (A) (by C-G formula based on SCr of 1.25 mg/dL (H)). Liver Function Tests:  Recent Labs  Lab 08/12/23 1854  AST 16  ALT 14  ALKPHOS 72  BILITOT 1.5*  PROT 7.1  ALBUMIN 3.6   No results for input(s): "LIPASE", "AMYLASE" in the last 168 hours. No results for input(s): "AMMONIA" in the last 168 hours. Coagulation Profile:  Recent Labs  Lab 08/13/23 0117  INR 1.4*   No results for input(s): "PROBNP" in the last 168 hours.  Recent Labs    08/13/23 0117  HGBA1C 8.0*   Recent Labs  Lab 08/12/23 1914 08/12/23 2331 08/13/23 0727  GLUCAP 120* 153* 77   No results for input(s): "CHOL", "HDL", "LDLCALC", "TRIG", "CHOLHDL", "LDLDIRECT" in the last 72 hours. No results for input(s): "TSH", "T4TOTAL", "FREET4", "T3FREE", "THYROIDAB" in the last 72 hours. Sepsis Labs: Recent Labs  Lab 08/12/23 1909  LATICACIDVEN 1.4   Recent Results (from the past 240 hours)  Resp panel by RT-PCR (RSV, Flu A&B, Covid) Anterior Nasal Swab     Status: None   Collection Time: 08/12/23  6:59 PM   Specimen: Anterior Nasal Swab  Result Value Ref Range Status   SARS Coronavirus 2 by RT PCR NEGATIVE NEGATIVE Final    Comment: (NOTE) SARS-CoV-2 target nucleic acids are NOT DETECTED.  The SARS-CoV-2 RNA is generally detectable in upper respiratory specimens during the acute phase of infection. The lowest concentration of SARS-CoV-2 viral copies this assay can detect is 138 copies/mL. A negative result does not preclude SARS-Cov-2 infection and should not be used as the sole basis for treatment or other patient management decisions. A negative result may occur with  improper specimen collection/handling, submission of specimen other than nasopharyngeal swab, presence of viral mutation(s) within the areas targeted by this assay, and inadequate number of viral copies(<138 copies/mL). A negative result must be combined with clinical observations, patient  history, and epidemiological information. The expected result is Negative.  Fact Sheet for Patients:  BloggerCourse.com  Fact Sheet for Healthcare Providers:  SeriousBroker.it  This test is no t yet approved or cleared by the [place] FDA and  has been authorized for detection and/or diagnosis of SARS-CoV-2 by FDA under an Emergency Use Authorization (EUA). This EUA will remain  in effect (meaning this test can be used) for the duration of the COVID-19 declaration under Section 564(b)(1) of the Act, 21 U.S.C.section 360bbb-3(b)(1), unless the authorization is terminated  or revoked sooner.       Influenza A by PCR NEGATIVE NEGATIVE Final   Influenza B by PCR NEGATIVE NEGATIVE Final    Comment: (NOTE) The Xpert Xpress SARS-CoV-2/FLU/RSV plus assay is intended as an aid in the diagnosis of influenza from Nasopharyngeal swab specimens and should not be used as a sole basis for treatment. Nasal washings and aspirates are unacceptable for Xpert Xpress SARS-CoV-2/FLU/RSV testing.  Fact Sheet for Patients: BloggerCourse.com  Fact Sheet for Healthcare Providers: SeriousBroker.it  This test is not yet approved or cleared by the [place] FDA and has been authorized for detection and/or diagnosis of SARS-CoV-2 by FDA under an Emergency Use Authorization (EUA). This EUA will remain in effect (meaning this test can be used) for the duration of the COVID-19 declaration under Section 564(b)(1) of the Act, 21 U.S.C. section 360bbb-3(b)(1), unless the authorization is terminated or revoked.     Resp Syncytial Virus by PCR NEGATIVE NEGATIVE Final    Comment: (NOTE) Fact Sheet for Patients: BloggerCourse.com  Fact Sheet for Healthcare Providers: SeriousBroker.it  This test is not yet approved or cleared by the [place] FDA  and has been authorized for detection and/or diagnosis of SARS-CoV-2 by FDA under an Emergency Use Authorization (EUA). This EUA will remain in effect (meaning this test can be used) for the duration of the COVID-19 declaration under Section 564(b)(1) of the Act, 21 U.S.C. section 360bbb-3(b)(1), unless the authorization is  terminated or revoked.  Performed at [facility], [address] [place], [place] [address]   Blood culture (routine x 2)     Status: None (Preliminary result)   Collection Time: 08/12/23  9:47 PM   Specimen: BLOOD  Result Value Ref Range Status   Specimen Description   Final    BLOOD RIGHT ANTECUBITAL Performed at [facility], [address] [place], [place] [address]    Special Requests   Final    BOTTLES DRAWN AEROBIC AND ANAEROBIC Blood Culture adequate volume Performed at [facility], [address], [place], [place] [address]    Culture   Final    NO GROWTH < 12 HOURS Performed at [facility], [address] [place], [place] [address]    Report Status PENDING  Incomplete  Blood culture (routine x 2)     Status: None (Preliminary result)   Collection Time: 08/13/23  1:17 AM   Specimen: BLOOD LEFT ARM  Result Value Ref Range Status   Specimen Description   Final    BLOOD LEFT ARM Performed at [facility], [address], [place], [place] [address]    Special Requests   Final    BOTTLES DRAWN AEROBIC AND ANAEROBIC Blood Culture results may not be optimal due to an inadequate volume of blood received in culture bottles Performed at [facility], [address] [place], [place] [address]    Culture PENDING  Incomplete   Report Status PENDING  Incomplete    Antimicrobials/Microbiology: Anti-infectives (From admission, onward)    Start     Dose/Rate Route Frequency Ordered Stop   08/13/23 2200  azithromycin (ZITHROMAX) 500 mg in sodium chloride 0.9 % 250  mL IVPB        500 mg 250 mL/hr over 60 Minutes Intravenous Every 24 hours 08/12/23 2322 08/18/23 2159   08/13/23 0015  cefTRIAXone (ROCEPHIN) 2 g in sodium chloride 0.9 % 100 mL IVPB        2 g 200 mL/hr over 30 Minutes Intravenous Every 24 hours 08/12/23 2322 08/17/23 2159   08/12/23 2130  levofloxacin (LEVAQUIN) IVPB 750 mg        750 mg 100 mL/hr over 90 Minutes Intravenous  Once 08/12/23 2129 08/12/23 2328         Component Value Date/Time   SDES  08/13/2023 0117    BLOOD LEFT ARM Performed at [facility], [address] [place], [place] [address]    SPECREQUEST  08/13/2023 0117    BOTTLES DRAWN AEROBIC AND ANAEROBIC Blood Culture results may not be optimal due to an inadequate volume of blood received in culture bottles Performed at [facility], [address] [place], [place] [address]    CULT PENDING 08/13/2023 0117   REPTSTATUS PENDING 08/13/2023 0117     Radiology Studies: [facility] Chest Port 1 View Result Date: 08/12/2023 CLINICAL DATA:  [id number]. Fever due to infection. Nausea, vomiting for few days. Flu and COVID negative. EXAM: PORTABLE CHEST 1 VIEW COMPARISON:  Portable chest 04/09/2021 FINDINGS: Lungs are expiratory. There is asymmetric increased opacity left lower lung field consistent with patchy pneumonic infiltrate. Remainder of the aerated lungs are visually clear but with limited view of the bases. The cardiomediastinal silhouette is stable. No vascular congestion is seen. There is mild aortic atherosclerosis and uncoiling. There is no substantial pleural effusion. There are chronic healed fractures of the right ribcage and bilateral  moderate shoulder DJD, chronic rotator cuff arthropathy. IMPRESSION: 1. Patchy pneumonic infiltrate left lower lung field. Follow-up study recommended after treatment to ensure clearing. 2. Expiratory chest with no further acute radiographic findings. 3. Aortic atherosclerosis. Electronically Signed   By: Almira Bar M.D.   On: 08/12/2023 20:11     LOS: 1 day   Total time spent in review of labs and imaging, patient evaluation, formulation of plan, documentation and communication with family: 50 minutes  Lanae Boast, MD  [facility]  08/13/2023, 11:50 AM

## 2023-08-14 DIAGNOSIS — A419 Sepsis, unspecified organism: Secondary | ICD-10-CM | POA: Diagnosis not present

## 2023-08-14 DIAGNOSIS — J189 Pneumonia, unspecified organism: Secondary | ICD-10-CM | POA: Diagnosis not present

## 2023-08-14 LAB — BLOOD GAS, VENOUS
Acid-base deficit: 11.4 mmol/L — ABNORMAL HIGH (ref 0.0–2.0)
Bicarbonate: 12.9 mmol/L — ABNORMAL LOW (ref 20.0–28.0)
O2 Saturation: 57.8 %
Patient temperature: 36.6
pCO2, Ven: 25 mm[Hg] — ABNORMAL LOW (ref 44–60)
pH, Ven: 7.32 (ref 7.25–7.43)
pO2, Ven: 34 mm[Hg] — CL (ref 32–45)

## 2023-08-14 LAB — GLUCOSE, CAPILLARY
Glucose-Capillary: 90 mg/dL (ref 70–99)
Glucose-Capillary: 190 mg/dL — ABNORMAL HIGH (ref 70–99)
Glucose-Capillary: 195 mg/dL — ABNORMAL HIGH (ref 70–99)
Glucose-Capillary: 121 mg/dL — ABNORMAL HIGH (ref 70–99)

## 2023-08-14 LAB — LACTIC ACID, PLASMA: Lactic Acid, Venous: 1 mmol/L (ref 0.5–1.9)

## 2023-08-14 LAB — BASIC METABOLIC PANEL
Anion gap: 16 — ABNORMAL HIGH (ref 5–15)
BUN: 20 mg/dL (ref 8–23)
CO2: 13 mmol/L — ABNORMAL LOW (ref 22–32)
Calcium: 7.7 mg/dL — ABNORMAL LOW (ref 8.9–10.3)
Chloride: 109 mmol/L (ref 98–111)
Creatinine, Ser: 1.03 mg/dL (ref 0.61–1.24)
GFR, Estimated: >60 mL/min (ref >60)
Glucose, Bld: 119 mg/dL — ABNORMAL HIGH (ref 70–99)
Potassium: 3.6 mmol/L (ref 3.5–5.1)
Sodium: 138 mmol/L (ref 135–145)

## 2023-08-14 MED ORDER — SACUBITRIL-VALSARTAN 24-26 MG PO TABS
1.0000 | ORAL_TABLET | Freq: Two times a day (BID) | ORAL | Status: DC
  Administered 2023-08-14 – 2023-08-15 (×3): 1 via ORAL
  Filled 2023-08-14 (×3): qty 1

## 2023-08-14 MED ORDER — HYDROCODONE BIT-HOMATROP MBR 5-1.5 MG/5ML PO SOLN
5.0000 mL | Freq: Four times a day (QID) | ORAL | Status: DC | PRN
  Administered 2023-08-14: 5 mL via ORAL
  Filled 2023-08-14: qty 5

## 2023-08-14 NOTE — Plan of Care (Signed)

## 2023-08-14 NOTE — [place] FL2 (Signed)
 [place] MEDICAID FL2 LEVEL OF CARE FORM     IDENTIFICATION  Patient Name: Matthew Calderon Birthdate: 06/24/1933 Sex: male Admission Date (Current Location): 08/12/2023  [facility] and [place] Number:  Producer, television/film/video and Address:  [facility],  [address] [place], [place] [address]      Provider Number: [id number]  Attending Physician Name and Address:  Lanae Boast, MD  Relative Name and Phone Number:  Armstead Peaks (daughter) Ph: [phone]    Current Level of Care: Hospital Recommended Level of Care: Assisted Living Facility Prior Approval Number:    Date Approved/Denied:   PASRR Number:    Discharge Plan: Other (Comment) [facility])    Current Diagnoses: Patient Active Problem List   Diagnosis Date Noted   Sepsis due to pneumonia (HCC) 08/12/2023   Age-related osteoporosis without current pathological fracture 03/17/2021   Penicillin allergy 03/17/2021   Androgen deficiency 03/17/2021   Disorder of lipid metabolism 03/17/2021   Diverticular disease of colon 03/17/2021   Drowsiness 03/17/2021   Encounter for general adult medical examination with abnormal findings 03/17/2021   Essential hypertension 03/17/2021   Gout 03/17/2021   History of psychiatric disorder 03/17/2021   Hypocalcemia 03/17/2021   Intertrigo 03/17/2021   Iron deficiency anemia 03/17/2021   Macular degeneration 03/17/2021   Peripheral arterial disease (HCC) 03/17/2021   Physical deconditioning 03/17/2021   Scoliosis 03/17/2021   Unequal leg length 03/17/2021   Vitamin B12 deficiency 03/17/2021   Vitamin D deficiency 03/17/2021   Macular pucker of both eyes 11/07/2020   Retinal edema 11/07/2020   Fall 01/14/2020   Chronic diastolic CHF (congestive heart failure) (HCC) 01/12/2020   Multiple rib fractures 01/11/2020   Dysequilibrium 01/11/2020   Fall at home, initial encounter 01/11/2020   Head trauma, subsequent encounter 01/11/2020   Arthritis 05/14/2019   Enlarged  prostate 05/14/2019   History of tobacco abuse 05/14/2019   Interstitial cystitis 05/14/2019   Obesity 05/14/2019   OSA (obstructive sleep apnea) 05/14/2019   PONV (postoperative nausea and vomiting) 05/14/2019   Post-nasal drip 05/14/2019   Seasonal allergies 05/14/2019   Chronic cough 07/21/2018   GERD (gastroesophageal reflux disease) 07/21/2018   NSTEMI (non-ST elevated myocardial infarction) (HCC) 06/20/2018   Prostate hyperplasia, benign localized, without urinary obstruction    Hyperglycemia    Syncope, cardiogenic    Degenerative joint disease (DJD) of hip 01/13/2014   Diabetes (HCC) 01/13/2014   Pseudophakia, both eyes 12/01/2013   Nonexudative age-related macular degeneration 01/16/2013   Primary open angle glaucoma of right eye, severe stage 01/13/2013   Allergic rhinitis 09/17/2010   Anemia, unspecified 09/17/2010   Gastro-esophageal reflux disease with esophagitis 12/27/2009   Other and unspecified hyperlipidemia 12/27/2009   Hypertrophy of prostate without urinary obstruction and other lower urinary tract symptoms (LUTS) 07/11/2009   Insomnia 07/11/2009   Osteoporosis 07/11/2009   Other malaise and fatigue 06/15/2008   Depressive disorder, not elsewhere classified 05/04/2008   Unspecified glaucoma 05/04/2008    Orientation RESPIRATION BLADDER Height & Weight     Self, Place  Normal Incontinent Weight: 153 lb 10.6 oz (69.7 kg) Height:  4\' 11"  (149.9 cm)  BEHAVIORAL SYMPTOMS/MOOD NEUROLOGICAL BOWEL NUTRITION STATUS      Continent Diet (Heart healthy/carb modified diet)  AMBULATORY STATUS COMMUNICATION OF NEEDS Skin   Supervision Verbally Other (Comment) (Groin: MASD)                       Personal Care Assistance Level of Assistance  Bathing, Feeding, Dressing Bathing Assistance: Limited assistance Feeding assistance: Limited assistance Dressing Assistance: Limited assistance     Functional Limitations Info  Sight, Hearing, Speech Sight Info:  Adequate Hearing Info: Adequate Speech Info: Adequate    SPECIAL CARE FACTORS FREQUENCY  PT (By licensed PT), OT (By licensed OT)     PT Frequency: Min 1x's/week OT Frequency: Min 1x's/week            Contractures Contractures Info: Not present    Additional Factors Info  Code Status, Allergies Code Status Info: Full Allergies Info: Alendronate Sodium, Penicillin G, Penicillins, Sulfa Antibiotics, Sulfacetamide Sodium           Current Medications (08/14/2023):  This is the current hospital active medication list Current Facility-Administered Medications  Medication Dose Route Frequency Provider Last Rate Last Admin   acetaminophen (TYLENOL) tablet 650 mg  650 mg Oral Q6H PRN Rometta Emery, MD       Or   acetaminophen (TYLENOL) suppository 650 mg  650 mg Rectal Q6H PRN Rometta Emery, MD       albuterol (PROVENTIL) (2.5 MG/3ML) 0.083% nebulizer solution 2.5 mg  2.5 mg Nebulization Q2H PRN Earlie Lou L, MD   2.5 mg at 08/12/23 2349   aspirin chewable tablet 81 mg  81 mg Oral Daily Kc, Dayna Barker, MD   81 mg at 08/14/23 0959   atorvastatin (LIPITOR) tablet 40 mg  40 mg Oral QPM Kc, Dayna Barker, MD   40 mg at 08/13/23 1820   azithromycin (ZITHROMAX) 500 mg in sodium chloride 0.9 % 250 mL IVPB  500 mg Intravenous Q24H Earlie Lou L, MD 250 mL/hr at 08/13/23 2127 500 mg at 08/13/23 2127   brimonidine (ALPHAGAN) 0.2 % ophthalmic solution 1 drop  1 drop Both Eyes BID Kc, Ramesh, MD   1 drop at 08/14/23 1001   cefTRIAXone (ROCEPHIN) 2 g in sodium chloride 0.9 % 100 mL IVPB  2 g Intravenous Q24H Earlie Lou L, MD 200 mL/hr at 08/13/23 2236 2 g at 08/13/23 2236   cholecalciferol (VITAMIN D3) 25 MCG (1000 UNIT) tablet 2,000 Units  2,000 Units Oral Daily Kc, Dayna Barker, MD   2,000 Units at 08/14/23 0959   clotrimazole (LOTRIMIN) 1 % cream   Topical BID Rometta Emery, MD   Given at 08/14/23 1000   enoxaparin (LOVENOX) injection 40 mg  40 mg Subcutaneous Q24H Kc, Ramesh, MD   40  mg at 08/14/23 0959   ferrous sulfate tablet 325 mg  325 mg Oral Daily Kc, Dayna Barker, MD   325 mg at 08/14/23 0959   finasteride (PROSCAR) tablet 5 mg  5 mg Oral QPM Kc, Dayna Barker, MD   5 mg at 08/13/23 1820   guaiFENesin (MUCINEX) 12 hr tablet 600 mg  600 mg Oral BID Earlie Lou L, MD   600 mg at 08/14/23 0959   HYDROcodone bit-homatropine (HYCODAN) 5-1.5 MG/5ML syrup 5 mL  5 mL Oral Q6H PRN Kc, Ramesh, MD   5 mL at 08/14/23 1520   insulin aspart (novoLOG) injection 0-9 Units  0-9 Units Subcutaneous TID WC Kc, Ramesh, MD   2 Units at 08/14/23 1239   ipratropium-albuterol (DUONEB) 0.5-2.5 (3) MG/3ML nebulizer solution 3 mL  3 mL Nebulization BID Kc, Ramesh, MD   3 mL at 08/14/23 1028   loratadine (CLARITIN) tablet 10 mg  10 mg Oral Daily Kc, Ramesh, MD   10 mg at 08/14/23 0959   melatonin tablet 10 mg  10 mg Oral QHS Kc, Ramesh,  MD   10 mg at 08/13/23 2122   mirabegron ER (MYRBETRIQ) tablet 25 mg  25 mg Oral Daily Kc, Dayna Barker, MD   25 mg at 08/14/23 1145   multivitamin (PROSIGHT) tablet 1 tablet  1 tablet Oral BID Lanae Boast, MD   1 tablet at 08/14/23 1308   nystatin (MYCOSTATIN/NYSTOP) topical powder   Topical BID Rometta Emery, MD   Given at 08/14/23 1000   ondansetron (ZOFRAN) tablet 4 mg  4 mg Oral Q6H PRN Rometta Emery, MD       Or   ondansetron (ZOFRAN) injection 4 mg  4 mg Intravenous Q6H PRN Rometta Emery, MD       sacubitril-valsartan (ENTRESTO) 24-26 mg per tablet  1 tablet Oral BID Kc, Ramesh, MD   1 tablet at 08/14/23 1145   sertraline (ZOLOFT) tablet 50 mg  50 mg Oral QPM Kc, Ramesh, MD   50 mg at 08/13/23 1820   tamsulosin (FLOMAX) capsule 0.4 mg  0.4 mg Oral QPC supper Kc, Dayna Barker, MD   0.4 mg at 08/13/23 1819   timolol (TIMOPTIC) 0.5 % ophthalmic solution 1 drop  1 drop Both Eyes BID Kc, Dayna Barker, MD   1 drop at 08/14/23 1001     Discharge Medications: Please see discharge summary for a list of discharge medications.  Relevant Imaging Results:  Relevant Lab  Results:   Additional Information SSN: [id number]  Ewing Schlein, LCSW

## 2023-08-14 NOTE — TOC Progression Note (Addendum)
 Transition of Care [facility]) - Progression Note    Patient Details  Name: Matthew Calderon MRN: [id number] Date of Birth: Dec 23, 1932  [facility]) CM/SW Contact  Adrian Prows, RN Phone Number: 08/14/2023, 1:23 PM  Clinical Narrative:    Awaiting PT eval; TOC is following.  -[id number]- call from [place] at [facility]; she was notified that recc was for HHPT/OT; she says if pt returns to AL these services would be through [facility].   Expected Discharge Plan: Assisted Living Barriers to Discharge: Continued Medical Work up  Expected Discharge Plan and Services In-house Referral: Clinical Social Work     Living arrangements for the past 2 months: Assisted Living Facility                 DME Arranged: N/A DME Agency: NA                   Social Determinants of Health (SDOH) Interventions SDOH Screenings   Food Insecurity: No Food Insecurity (08/12/2023)  Housing: Low Risk  (08/12/2023)  Transportation Needs: No Transportation Needs (08/12/2023)  Utilities: Not At Risk (08/12/2023)  Social Connections: Unknown (08/12/2023)  Tobacco Use: Medium Risk (08/12/2023)    Readmission Risk Interventions     No data to display

## 2023-08-14 NOTE — Plan of Care (Signed)

## 2023-08-14 NOTE — Evaluation (Signed)
 Occupational Therapy Evaluation Patient Details Name: Matthew Calderon MRN: [id number] DOB: 21-Mar-1933 Today's Date: 08/14/2023   History of Present Illness   Pt is an 89 y.o. male presenting with fever, cough, nausea vomiting diarrhea, and weakness admitted with sepsis due to pneumonia. [name] PMH significant for GERD, diabetes,anxiety/depression, hiatal hernia OSA not on CPAP, B hip/knee replacements, and R RTC repair.     Clinical Impressions Pt admitted with the above diagnosis. Pt currently with functional limitations due to the deficits listed below (see OT Problem List). Prior to admit, pt reports that he was living at [facility] and completing all BADL tasks and functional mobility at Mod I level. Pt will benefit from acute skilled OT to increase their safety and independence with ADL and functional mobility for ADL to facilitate discharge. Recommend home health OT services at discharge to focus on mentioned deficits and allow pt to return to higher level of independence.       If plan is discharge home, recommend the following:   A little help with walking and/or transfers;A little help with bathing/dressing/bathroom     Functional Status Assessment   Patient has had a recent decline in their functional status and demonstrates the ability to make significant improvements in function in a reasonable and predictable amount of time.     Equipment Recommendations   None recommended by OT      Precautions/Restrictions   Precautions Precautions: Fall Recall of Precautions/Restrictions: Intact Restrictions Weight Bearing Restrictions Per Provider Order: No     Mobility Bed Mobility Overal bed mobility: Needs Assistance Bed Mobility: Supine to Sit, Sit to Supine     Supine to sit: Supervision, HOB elevated Sit to supine: Min assist   General bed mobility comments: HOB lowered for sit to supine with assisted to bring BLE back up onto bed. Increased time to complete  all tasks.    Transfers Overall transfer level: Needs assistance Equipment used: Rollator (4 wheels) Transfers: Sit to/from Stand, Bed to chair/wheelchair/BSC Sit to Stand: Supervision     Step pivot transfers: Supervision     General transfer comment: VC to manage brakes on Rollator during sit<> stand transition.      Balance Overall balance assessment: Needs assistance Sitting-balance support: Feet supported, No upper extremity supported Sitting balance-Leahy Scale: Good Sitting balance - Comments: EOB sitting   Standing balance support: Bilateral upper extremity supported, During functional activity, Reliant on assistive device for balance Standing balance-Leahy Scale: Poor Standing balance comment: reliant on external support      ADL either performed or assessed with clinical judgement   ADL   Eating/Feeding: Set up;Bed level   Grooming: Set up;Bed level   Upper Body Bathing: Minimal assistance;Sitting   Lower Body Bathing: Moderate assistance;Sit to/from stand   Upper Body Dressing : Minimal assistance;Sitting   Lower Body Dressing: Moderate assistance;Sit to/from stand   Toilet Transfer: Supervision/safety;Ambulation;Comfort height toilet;Rollator (4 wheels);Cueing for safety   Toileting- Clothing Manipulation and Hygiene: Minimal assistance;Sit to/from stand               Vision Baseline Vision/History: 1 Wears glasses;3 Glaucoma;6 Macular Degeneration Ability to See in Adequate Light: 2 Moderately impaired Patient Visual Report: No change from baseline Additional Comments: baseline visual impairments noted     Perception Perception: Not tested       Praxis Praxis: Not tested       Pertinent Vitals/Pain Pain Assessment Pain Assessment: Faces Faces Pain Scale: Hurts a little bit Pain Location: chronic low  back pain which increases with ambulation Pain Descriptors / Indicators: Aching, Discomfort Pain Intervention(s): Limited activity  within patient's tolerance, Monitored during session     Extremity/Trunk Assessment Upper Extremity Assessment Upper Extremity Assessment: Right hand dominant;RUE deficits/detail;LUE deficits/detail RUE Deficits / Details: Baseline RTC impairment with limited ROM. Able to demonstrate active shoulder flexion to ~90 degrees. Functional gross grasp. Strength is 3-/5 shoulde flexion, 4/5 IR, elbow flexion/extension LUE Deficits / Details: A/ROM WFL in all ranges. strength is 4/5 grossly overall   Lower Extremity Assessment Lower Extremity Assessment: Defer to PT evaluation   Cervical / Trunk Assessment Cervical / Trunk Assessment: Kyphotic;Other exceptions Cervical / Trunk Exceptions: body habitus, scoliosis   Communication Communication Communication: Impaired Factors Affecting Communication: Hearing impaired   Cognition Arousal: Alert Behavior During Therapy: WFL for tasks assessed/performed  Following commands: Intact       Cueing  General Comments   Cueing Techniques: Verbal cues              Home Living Family/patient expects to be discharged to:: Assisted living [facility])        Home Equipment: Grab bars - tub/shower;Hand held shower head;Shower seat - built in;Rollator (4 wheels);Adaptive equipment Adaptive Equipment: Sock aid        Prior Functioning/Environment Prior Level of Function : Independent/Modified Independent      Mobility Comments: Uses Rolator for mobility ADLs Comments: assist from staff with med management, goes to dining hall for meals. reports independent with bathing/dressing/toileting. Uses BSC over toilet.    OT Problem List: Decreased activity tolerance;Impaired balance (sitting and/or standing)   OT Treatment/Interventions: Self-care/ADL training;Therapeutic exercise;Therapeutic activities;Energy conservation;Patient/family education;DME and/or AE instruction;Manual therapy;Balance training      OT Goals(Current goals can be  found in the care plan section)   Acute Rehab OT Goals Patient Stated Goal: requesting water and ice cream OT Goal Formulation: With patient Time For Goal Achievement: 08/28/23 Potential to Achieve Goals: Good   OT Frequency:  Min 1X/week       AM-PAC OT "6 Clicks" Daily Activity     Outcome Measure Help from another person eating meals?: None Help from another person taking care of personal grooming?: None Help from another person toileting, which includes using toliet, bedpan, or urinal?: A Lot Help from another person bathing (including washing, rinsing, drying)?: A Lot Help from another person to put on and taking off regular upper body clothing?: A Little Help from another person to put on and taking off regular lower body clothing?: A Lot 6 Click Score: 17   End of Session Equipment Utilized During Treatment: Gait belt;Rolling walker (2 wheels)  Activity Tolerance: Patient tolerated treatment well Patient left: in bed;with call bell/phone within reach;with bed alarm set  OT Visit Diagnosis: Unsteadiness on feet (R26.81);Muscle weakness (generalized) (M62.81)                Time: 1308-6578 OT Time Calculation (min): 32 min Charges:  OT General Charges $OT Visit: 1 Visit OT Evaluation $OT Eval Moderate Complexity: 1 Mod OT Treatments $Therapeutic Activity: 8-22 mins  Limmie Patricia, OTR/L,CBIS  Supplemental OT - [facility] and [facility] Secure Chat Preferred    Cylis Ayars, Charisse March 08/14/2023, 3:17 PM

## 2023-08-14 NOTE — Progress Notes (Signed)
 PROGRESS NOTE Matthew Calderon  WUJ:[id number] DOB: 1933-04-18 DOA: 08/12/2023 PCP: Eloisa Northern, MD  Brief Narrative/Hospital Course: 89 y.o. male  with history of GERD, anxiety disorder, diabetes,anxiety/depression hiatal hernia OSA not on CPAP resident of ALF presented with fever, cough nausea vomiting diarrhea for a few days.  Patient also having weakness difficulty walking with walker has been not been able to eat or drink well. In the ED vitals stable afebrile.  Labs elevated BUN/creatinine 1.4, bicarb 17 leukocytosis 13.5 influenza A COVID and RSV negative, UA unremarkable. CXR>> pathognomonic infiltrate  LLL, will start on antibiotics and admitted     Subjective:  This morning patient complains of ongoing bouts of cough, does not feel ready for return back to assisted living facility today  Saturating well on room air however on RA Overnight afebrile Labs shows bicarb at 13 lactic acid normal 1.0 VBG with low pCO2 and 02   Assessment and Plan: Principal Problem:   Sepsis due to pneumonia (HCC) Active Problems:   Diabetes (HCC)   GERD (gastroesophageal reflux disease)   Gastro-esophageal reflux disease with esophagitis   History of tobacco abuse   OSA (obstructive sleep apnea)   Essential hypertension   LLL pneumonia Sepsis POA-met criteria with tachycardia 102 leukocytosis pneumonia: Patient presented with respiratory symptoms, labs remarkable for leukocytosis,cxr personally reviewed and shows pneumonia.blood culture NGTD.  Leukocytosis down trended.  Still having ongoing coughing spells added Hycodan.  Fortunately doing well on room air.   Continue current ceftriaxone, azithromycin  Recent Labs  Lab 08/12/23 1854 08/12/23 1909 08/13/23 0117 08/14/23 0951  WBC 13.5*  --  11.8*  --   LATICACIDVEN  --  1.4  --  1.0    AKI with mild metabolic acidosis: Elevated creatinine 1.4 on admission baseline around 0.9-1.  Encourage PO intake.  Will resume Entresto and Lasix today.    Recent Labs    08/12/23 1854 08/13/23 0117 08/14/23 0436  BUN 34*  --  20  CREATININE 1.40* 1.25* 1.03  CO2 17*  --  13*  K 4.0  --  3.6   Chronic CHF with preserved EF: In the remote past had reduced EF.  Will resume his home Entresto. PTA on prn Lasix.   T2DM: A1c 8.1 blood sugar stable keep on SSI.  Holding home meds Recent Labs  Lab 08/12/23 2331 08/13/23 0117 08/13/23 0727 08/13/23 1151 08/13/23 1652 08/13/23 2129  GLUCAP 153*  --  77 96 89 83  HGBA1C  --  8.0*  --   --   --   --     GERD: Continue PPI  Tobacco abuse: Cessation counseling and nicotine patch  Essential HTN: BP stable.  Meds on hold for now.  HLD: Resume statin  Class I Obesity OSA not using CPAP: Will benefit with PCP follow-up, weight loss  healthy lifestyle and outpatient sleep evaluation.   DVT prophylaxis: enoxaparin (LOVENOX) injection 40 mg Start: 08/14/23 1000 Code Status:   Code Status: Full Code Family Communication: plan of care discussed with patient/none at bedside. Daughter updated on phone Patient status is: Remains hospitalized because of severity of illness Level of care: Med-Surg   Dispo: The patient is from: ALF.            Anticipated disposition: PT OT to evaluate today anticipate discharge next 24 hours   Objective: Vitals last 24 hrs: Vitals:   08/13/23 1917 08/13/23 1926 08/14/23 0443 08/14/23 1028  BP:  127/62 118/65   Pulse:  95 89  Resp:  17 17   Temp:  98 F (36.7 C) (!) 97.4 F (36.3 C)   TempSrc:  Oral Oral   SpO2: 94% 97% 95% 93%  Weight:      Height:       Weight change:   Physical Examination: General exam: alert awake, oriented at baseline, older than stated age HEENT:Oral mucosa moist, Ear/Nose WNL grossly Respiratory system: Bilaterally diminished BS,no use of accessory muscle Cardiovascular system: S1 & S2 +, No JVD. Gastrointestinal system: Abdomen soft,NT,ND, BS+ Nervous System: Alert, awake, moving all extremities,and following  commands. Extremities: LE edema neg,distal peripheral pulses palpable and warm.  Skin: No rashes,no icterus. MSK: Normal muscle bulk,tone, power   Medications reviewed:  Scheduled Meds:  aspirin  81 mg Oral Daily   atorvastatin  40 mg Oral QPM   brimonidine  1 drop Both Eyes BID   cholecalciferol  2,000 Units Oral Daily   clotrimazole   Topical BID   enoxaparin (LOVENOX) injection  40 mg Subcutaneous Q24H   ferrous sulfate  325 mg Oral Daily   finasteride  5 mg Oral QPM   guaiFENesin  600 mg Oral BID   insulin aspart  0-9 Units Subcutaneous TID WC   ipratropium-albuterol  3 mL Nebulization BID   loratadine  10 mg Oral Daily   melatonin  10 mg Oral QHS   mirabegron ER  25 mg Oral Daily   multivitamin  1 tablet Oral BID   nystatin   Topical BID   sertraline  50 mg Oral QPM   tamsulosin  0.4 mg Oral QPC supper   timolol  1 drop Both Eyes BID   Continuous Infusions:  azithromycin 500 mg (08/13/23 2127)   cefTRIAXone (ROCEPHIN)  IV 2 g (08/13/23 2236)    Diet Order             Diet heart healthy/carb modified Room service appropriate? Yes; Fluid consistency: Thin  Diet effective now                  Intake/Output Summary (Last 24 hours) at 08/14/2023 1056 Last data filed at 08/13/2023 2215 Gross per 24 hour  Intake 2734.06 ml  Output 1650 ml  Net 1084.06 ml   Net IO Since Admission: 704.06 mL [08/14/23 1056]  Wt Readings from Last 3 Encounters:  08/12/23 69.7 kg  05/10/21 69.7 kg  04/09/21 68 kg     Unresulted Labs (From admission, onward)     Start     Ordered   08/19/23 0500  Creatinine, serum  (enoxaparin (LOVENOX)    CrCl >/= 30 ml/min)  Weekly,   R     Comments: while on enoxaparin therapy    08/12/23 2322   08/14/23 0500  Basic metabolic panel  Daily,   R      08/13/23 0758           Data Reviewed: I have personally reviewed following labs and imaging studies CBC: Recent Labs  Lab 08/12/23 1854 08/13/23 0117  WBC 13.5* 11.8*  NEUTROABS  11.3*  --   HGB 12.4* 11.3*  HCT 39.1 34.6*  MCV 91.1 89.9  PLT 198 170   Basic Metabolic Panel:  Recent Labs  Lab 08/12/23 1854 08/13/23 0117 08/14/23 0436  NA 136  --  138  K 4.0  --  3.6  CL 105  --  109  CO2 17*  --  13*  GLUCOSE 134*  --  119*  BUN 34*  --  20  CREATININE 1.40* 1.25* 1.03  CALCIUM 8.3*  --  7.7*   GFR: Estimated Creatinine Clearance: 39.6 mL/min (by C-G formula based on SCr of 1.03 mg/dL). Liver Function Tests:  Recent Labs  Lab 08/12/23 1854  AST 16  ALT 14  ALKPHOS 72  BILITOT 1.5*  PROT 7.1  ALBUMIN 3.6   No results for input(s): "LIPASE", "AMYLASE" in the last 168 hours. No results for input(s): "AMMONIA" in the last 168 hours. Coagulation Profile:  Recent Labs  Lab 08/13/23 0117  INR 1.4*   No results for input(s): "PROBNP" in the last 168 hours.  Recent Labs    08/13/23 0117  HGBA1C 8.0*   Recent Labs  Lab 08/12/23 2331 08/13/23 0727 08/13/23 1151 08/13/23 1652 08/13/23 2129  GLUCAP 153* 77 96 89 83   No results for input(s): "CHOL", "HDL", "LDLCALC", "TRIG", "CHOLHDL", "LDLDIRECT" in the last 72 hours. No results for input(s): "TSH", "T4TOTAL", "FREET4", "T3FREE", "THYROIDAB" in the last 72 hours. Sepsis Labs: Recent Labs  Lab 08/12/23 1909 08/14/23 0951  LATICACIDVEN 1.4 1.0   Recent Results (from the past 240 hours)  Resp panel by RT-PCR (RSV, Flu A&B, Covid) Anterior Nasal Swab     Status: None   Collection Time: 08/12/23  6:59 PM   Specimen: Anterior Nasal Swab  Result Value Ref Range Status   SARS Coronavirus 2 by RT PCR NEGATIVE NEGATIVE Final    Comment: (NOTE) SARS-CoV-2 target nucleic acids are NOT DETECTED.  The SARS-CoV-2 RNA is generally detectable in upper respiratory specimens during the acute phase of infection. The lowest concentration of SARS-CoV-2 viral copies this assay can detect is 138 copies/mL. A negative result does not preclude SARS-Cov-2 infection and should not be used as the sole basis  for treatment or other patient management decisions. A negative result may occur with  improper specimen collection/handling, submission of specimen other than nasopharyngeal swab, presence of viral mutation(s) within the areas targeted by this assay, and inadequate number of viral copies(<138 copies/mL). A negative result must be combined with clinical observations, patient history, and epidemiological information. The expected result is Negative.  Fact Sheet for Patients:  BloggerCourse.com  Fact Sheet for Healthcare Providers:  SeriousBroker.it  This test is no t yet approved or cleared by the [place] FDA and  has been authorized for detection and/or diagnosis of SARS-CoV-2 by FDA under an Emergency Use Authorization (EUA). This EUA will remain  in effect (meaning this test can be used) for the duration of the COVID-19 declaration under Section 564(b)(1) of the Act, 21 U.S.C.section 360bbb-3(b)(1), unless the authorization is terminated  or revoked sooner.       Influenza A by PCR NEGATIVE NEGATIVE Final   Influenza B by PCR NEGATIVE NEGATIVE Final    Comment: (NOTE) The Xpert Xpress SARS-CoV-2/FLU/RSV plus assay is intended as an aid in the diagnosis of influenza from Nasopharyngeal swab specimens and should not be used as a sole basis for treatment. Nasal washings and aspirates are unacceptable for Xpert Xpress SARS-CoV-2/FLU/RSV testing.  Fact Sheet for Patients: BloggerCourse.com  Fact Sheet for Healthcare Providers: SeriousBroker.it  This test is not yet approved or cleared by the [place] FDA and has been authorized for detection and/or diagnosis of SARS-CoV-2 by FDA under an Emergency Use Authorization (EUA). This EUA will remain in effect (meaning this test can be used) for the duration of the COVID-19 declaration under Section 564(b)(1) of the Act, 21  U.S.C. section 360bbb-3(b)(1), unless the authorization is terminated  or revoked.     Resp Syncytial Virus by PCR NEGATIVE NEGATIVE Final    Comment: (NOTE) Fact Sheet for Patients: BloggerCourse.com  Fact Sheet for Healthcare Providers: SeriousBroker.it  This test is not yet approved or cleared by the [place] FDA and has been authorized for detection and/or diagnosis of SARS-CoV-2 by FDA under an Emergency Use Authorization (EUA). This EUA will remain in effect (meaning this test can be used) for the duration of the COVID-19 declaration under Section 564(b)(1) of the Act, 21 U.S.C. section 360bbb-3(b)(1), unless the authorization is terminated or revoked.  Performed at [facility], [address] [place], [place] [address]   Blood culture (routine x 2)     Status: None (Preliminary result)   Collection Time: 08/12/23  9:47 PM   Specimen: BLOOD  Result Value Ref Range Status   Specimen Description   Final    BLOOD RIGHT ANTECUBITAL Performed at [facility], [address] [place], [place] [address]    Special Requests   Final    BOTTLES DRAWN AEROBIC AND ANAEROBIC Blood Culture adequate volume Performed at [facility], [address] [place], [place] [address]    Culture   Final    NO GROWTH 1 DAY Performed at [facility], [address], [place], [place] [address]    Report Status PENDING  Incomplete  Blood culture (routine x 2)     Status: None (Preliminary result)   Collection Time: 08/13/23  1:17 AM   Specimen: BLOOD LEFT ARM  Result Value Ref Range Status   Specimen Description   Final    BLOOD LEFT ARM Performed at [facility], [address] [place], [place] [address]    Special Requests   Final    BOTTLES DRAWN AEROBIC AND ANAEROBIC Blood Culture results may not be optimal due to an inadequate volume of blood received in culture  bottles Performed at [facility], [address], [place], [place] [address]    Culture   Final    NO GROWTH 1 DAY Performed at [facility], [address] [place], [place] [address]    Report Status PENDING  Incomplete    Antimicrobials/Microbiology: Anti-infectives (From admission, onward)    Start     Dose/Rate Route Frequency Ordered Stop   08/13/23 2200  azithromycin (ZITHROMAX) 500 mg in sodium chloride 0.9 % 250 mL IVPB        500 mg 250 mL/hr over 60 Minutes Intravenous Every 24 hours 08/12/23 2322 08/18/23 2159   08/13/23 0015  cefTRIAXone (ROCEPHIN) 2 g in sodium chloride 0.9 % 100 mL IVPB        2 g 200 mL/hr over 30 Minutes Intravenous Every 24 hours 08/12/23 2322 08/17/23 2159   08/12/23 2130  levofloxacin (LEVAQUIN) IVPB 750 mg        750 mg 100 mL/hr over 90 Minutes Intravenous  Once 08/12/23 2129 08/12/23 2328         Component Value Date/Time   SDES  08/13/2023 0117    BLOOD LEFT ARM Performed at [facility], [address] [place], [place] [address]    SPECREQUEST  08/13/2023 0117    BOTTLES DRAWN AEROBIC AND ANAEROBIC Blood Culture results may not be optimal due to an inadequate volume of blood received in culture bottles Performed at [facility], [address] [place], [place] [address]    CULT  08/13/2023 0117    NO GROWTH 1 DAY Performed at [facility], [address], [place], [place] [address]    REPTSTATUS PENDING 08/13/2023 0117     Radiology Studies: [facility] Chest Port 1 View Result Date: 08/12/2023 CLINICAL DATA:  [id number]. Fever due to infection. Nausea, vomiting for few days. Flu and COVID negative. EXAM: PORTABLE CHEST 1 VIEW COMPARISON:  Portable chest 04/09/2021 FINDINGS: Lungs are expiratory. There is asymmetric increased opacity left lower lung field consistent with patchy pneumonic infiltrate. Remainder of the aerated lungs are visually clear but with limited view of the bases.  The cardiomediastinal silhouette is stable. No vascular congestion is seen. There is mild aortic atherosclerosis and uncoiling. There is no substantial pleural effusion. There are chronic healed fractures of the right ribcage and bilateral moderate shoulder DJD, chronic rotator cuff arthropathy. IMPRESSION: 1. Patchy pneumonic infiltrate left lower lung field. Follow-up study recommended after treatment to ensure clearing. 2. Expiratory chest with no further acute radiographic findings. 3. Aortic atherosclerosis. Electronically Signed   By: Almira Bar M.D.   On: 08/12/2023 20:11     LOS: 2 days   Total time spent in review of labs and imaging, patient evaluation, formulation of plan, documentation and communication with family: 50 minutes  Lanae Boast, MD  [facility]  08/14/2023, 10:56 AM

## 2023-08-14 NOTE — Evaluation (Signed)
 Physical Therapy Evaluation Patient Details Name: Matthew Calderon MRN: [id number] DOB: 11/05/1932 Today's Date: 08/14/2023  History of Present Illness  Pt is an 89 y.o. male presenting with fever, cough, nausea vomiting diarrhea, and weakness admitted with sepsis due to pneumonia. [name] PMH significant for GERD, diabetes,anxiety/depression, hiatal hernia OSA not on CPAP, B hip/knee replacements, and R RTC repair.   Clinical Impression  Pt is a very pleasant 89 y.o. male with above HPI resulting in the deficits listed below (see PT Problem List). Pt resides at [facility]F and is typically modified independent with use of rollator, independent with ADLS- receives assist with meals and medication management. Pt performed sit to stand transfers and ambulated 124ft with CGA and no overt LOB observed. Pt with complaints of increased back pain with prolonged ambulation (pt reports chronic). Pt will benefit from continued skilled PT services to maximize functional mobility and increase independence. Recommend return to ALF upon d/c.                 Equipment Recommendations None recommended by PT (pt owns rollator)  Recommendations for Other Services       Functional Status Assessment Patient has had a recent decline in their functional status and demonstrates the ability to make significant improvements in function in a reasonable and predictable amount of time.     Precautions / Restrictions Precautions Precautions: Fall Restrictions Weight Bearing Restrictions Per Provider Order: No      Mobility  Bed Mobility Overal bed mobility: Needs Assistance Bed Mobility: Supine to Sit, Sit to Supine     Supine to sit: Min assist Sit to supine: Supervision   General bed mobility comments: HOB lowered, no bed rails to simulate home environment. Assist to bring trunk to upright, increased time.    Transfers Overall transfer level: Needs assistance Equipment used: Rolling walker (2  wheels) Transfers: Sit to/from Stand Sit to Stand: Contact guard assist           General transfer comment: increased time to achieve full stand. Able to perform marches in place prior to forward ambulation.    Ambulation/Gait Ambulation/Gait assistance: Contact guard assist Gait Distance (Feet): 110 Feet Assistive device: Rolling walker (2 wheels) Gait Pattern/deviations: Step-through pattern, Decreased stride length, Trunk flexed Gait velocity: decreased     General Gait Details: increased trunk flexion with further distance- suspect due to LBP. No overt LOB observed . Noted difficulty with dual-tasking and pt stopping when answering therapist questions.  Stairs            Wheelchair Mobility     Tilt Bed    Modified Rankin (Stroke Patients Only)       Balance Overall balance assessment: Needs assistance Sitting-balance support: Feet supported Sitting balance-Leahy Scale: Good     Standing balance support: Bilateral upper extremity supported, During functional activity, Reliant on assistive device for balance Standing balance-Leahy Scale: Poor Standing balance comment: reliant on external support                             Pertinent Vitals/Pain Pain Assessment Pain Assessment: Faces Faces Pain Scale: Hurts little more Pain Location: LBP with prolonged ambulation (chronic back pain per pt report) Pain Descriptors / Indicators: Aching, Discomfort Pain Intervention(s): Limited activity within patient's tolerance, Monitored during session    Home Living Family/patient expects to be discharged to:: Assisted living [facility])  Home Equipment: Grab bars - tub/shower;Hand held shower head;Shower seat - built in;Rollator (4 wheels)      Prior Function Prior Level of Function : Independent/Modified Independent             Mobility Comments: rollator for mobility. Unable to recall the last time he had a fall. ADLs  Comments: assist from staff with med management, goes to dining hall for meals. reports independent with bathing/dressing/toileting. WIS at ALF. Uses BSC over toilet.     Extremity/Trunk Assessment   Upper Extremity Assessment Upper Extremity Assessment: Defer to OT evaluation    Lower Extremity Assessment Lower Extremity Assessment: Generalized weakness    Cervical / Trunk Assessment Cervical / Trunk Assessment: Kyphotic  Communication   Communication Communication: Impaired Factors Affecting Communication: Hearing impaired    Cognition Arousal: Alert Behavior During Therapy: WFL for tasks assessed/performed   PT - Cognitive impairments: No family/caregiver present to determine baseline                                 Cueing       General Comments      Exercises     Assessment/Plan    PT Assessment Patient needs continued PT services  PT Problem List Decreased strength       PT Treatment Interventions DME instruction;Gait training;Functional mobility training;Therapeutic activities;Therapeutic exercise;Balance training;Patient/family education    PT Goals (Current goals can be found in the Care Plan section)  Acute Rehab PT Goals Patient Stated Goal: Get up and moving again PT Goal Formulation: With patient Time For Goal Achievement: 08/28/23 Potential to Achieve Goals: Good    Frequency Min 1X/week     Co-evaluation               AM-PAC PT "6 Clicks" Mobility  Outcome Measure Help needed turning from your back to your side while in a flat bed without using bedrails?: A Little Help needed moving from lying on your back to sitting on the side of a flat bed without using bedrails?: A Little Help needed moving to and from a bed to a chair (including a wheelchair)?: A Little Help needed standing up from a chair using your arms (e.g., wheelchair or bedside chair)?: A Little Help needed to walk in hospital room?: A Little Help needed  climbing 3-5 steps with a railing? : A Lot 6 Click Score: 17    End of Session Equipment Utilized During Treatment: Gait belt Activity Tolerance: Patient tolerated treatment well Patient left: in bed;with call bell/phone within reach;with bed alarm set (assisted with tray for lunch) Nurse Communication: Mobility status PT Visit Diagnosis: Muscle weakness (generalized) (M62.81);Pain Pain - Right/Left:  (mid) Pain - part of body:  (back)    Time: 6213-0865 PT Time Calculation (min) (ACUTE ONLY): 24 min   Charges:   PT Evaluation $PT Eval Low Complexity: 1 Low PT Treatments $Therapeutic Activity: 8-22 mins PT General Charges $$ ACUTE PT VISIT: 1 Visit        Lyman Speller PT, DPT  Acute Rehabilitation Services  Office [phone]  08/14/2023, 2:08 PM

## 2023-08-15 DIAGNOSIS — A419 Sepsis, unspecified organism: Secondary | ICD-10-CM | POA: Diagnosis not present

## 2023-08-15 DIAGNOSIS — J189 Pneumonia, unspecified organism: Secondary | ICD-10-CM | POA: Diagnosis not present

## 2023-08-15 LAB — BASIC METABOLIC PANEL
Anion gap: 12 (ref 5–15)
BUN: 18 mg/dL (ref 8–23)
CO2: 16 mmol/L — ABNORMAL LOW (ref 22–32)
Calcium: 7.6 mg/dL — ABNORMAL LOW (ref 8.9–10.3)
Chloride: 105 mmol/L (ref 98–111)
Creatinine, Ser: 0.71 mg/dL (ref 0.61–1.24)
GFR, Estimated: >60 mL/min (ref >60)
Glucose, Bld: 184 mg/dL — ABNORMAL HIGH (ref 70–99)
Potassium: 3.2 mmol/L — ABNORMAL LOW (ref 3.5–5.1)
Sodium: 133 mmol/L — ABNORMAL LOW (ref 135–145)

## 2023-08-15 LAB — GLUCOSE, CAPILLARY: Glucose-Capillary: 143 mg/dL — ABNORMAL HIGH (ref 70–99)

## 2023-08-15 MED ORDER — GUAIFENESIN ER 600 MG PO TB12: 600.0000 mg | ORAL_TABLET | Freq: Two times a day (BID) | ORAL | Status: DC

## 2023-08-15 MED ORDER — CEFPODOXIME PROXETIL 200 MG PO TABS: 200.0000 mg | ORAL_TABLET | Freq: Two times a day (BID) | ORAL | 0 refills | Status: AC

## 2023-08-15 MED ORDER — SACCHAROMYCES BOULARDII 250 MG PO CAPS: 250.0000 mg | ORAL_CAPSULE | Freq: Two times a day (BID) | ORAL | 0 refills | Status: AC

## 2023-08-15 MED ORDER — HYDROCODONE BIT-HOMATROP MBR 5-1.5 MG/5ML PO SOLN: 5.0000 mL | Freq: Four times a day (QID) | ORAL | 0 refills | Status: AC | PRN

## 2023-08-15 MED ORDER — POTASSIUM CHLORIDE CRYS ER 20 MEQ PO TBCR: 60.0000 meq | EXTENDED_RELEASE_TABLET | Freq: Once | ORAL | Status: DC

## 2023-08-15 MED ORDER — AZITHROMYCIN 500 MG PO TABS
500.0000 mg | ORAL_TABLET | Freq: Every day | ORAL | 0 refills | Status: AC
Start: 2023-08-15 — End: 2023-08-17

## 2023-08-15 NOTE — Plan of Care (Signed)
 Patient alert and oriented. Non productive cough noted. Patient medicated for comfort as needed with positive effect. All needs attended to. Will continue to monitor.   Problem: Clinical Measurements: Goal: Ability to maintain clinical measurements within normal limits will improve Outcome: Progressing Goal: Respiratory complications will improve Outcome: Progressing   Problem: Activity: Goal: Risk for activity intolerance will decrease Outcome: Progressing   Problem: Coping: Goal: Level of anxiety will decrease Outcome: Progressing   Problem: Pain Managment: Goal: General experience of comfort will improve and/or be controlled Outcome: Progressing   Problem: Skin Integrity: Goal: Risk for impaired skin integrity will decrease Outcome: Progressing

## 2023-08-15 NOTE — TOC Transition Note (Addendum)
 Transition of Care [facility]) - Discharge Note   Patient Details  Name: Matthew Calderon MRN: [id number] Date of Birth: [birth year]  Transition of Care [facility]) CM/SW Contact:  Adrian Prows, RN Phone Number: 08/15/2023, 10:06 AM   Clinical Narrative:    D/C orders received; spoke w/ Deanna Artis  Supervisor at [facility]; she gave RM # [phone], call report # [phone]; she also confirms orders for HHPT/OT have been received; transport by [facility]; pt's dtr Armstead Peaks [phone]) notified; she agreed to d/c plan; [facility] called at [phone]; spoke w/ Parne; no TOC needs.   Final next level of care: Skilled Nursing Facility Barriers to Discharge: No Barriers Identified   Patient Goals and CMS Choice Patient states their goals for this hospitalization and ongoing recovery are:: Return to [facility]          Discharge Placement              Patient chooses bed at: [facility] Patient to be transferred to facility by: [facility] Name of family member notified: Avie Arenas (dtr) [phone] Patient and family notified of of transfer: 08/15/23  Discharge Plan and Services Additional resources added to the After Visit Summary for   In-house Referral: Clinical Social Work              DME Arranged: N/A DME Agency: NA                  Social Drivers of Health (SDOH) Interventions SDOH Screenings   Food Insecurity: No Food Insecurity (08/12/2023)  Housing: Low Risk  (08/12/2023)  Transportation Needs: No Transportation Needs (08/12/2023)  Utilities: Not At Risk (08/12/2023)  Social Connections: Unknown (08/12/2023)  Tobacco Use: Medium Risk (08/12/2023)     Readmission Risk Interventions     No data to display

## 2023-08-15 NOTE — Discharge Summary (Addendum)
 Physician Discharge Summary  OSBY SWEETIN WJX:[id number] DOB: Jul 29, 1932 DOA: 08/12/2023  PCP: Eloisa Northern, MD  Admit date: 08/12/2023 Discharge date: 08/15/2023 Recommendations for Outpatient Follow-up:  Follow up with PCP in 1 weeks-call for appointment Please obtain BMP/CBC in one week  Discharge Dispo: ALF w/ [facility] Discharge Condition: Stable Code Status:   Code Status: Full Code Diet recommendation:  Diet Order             Diet heart healthy/carb modified Room service appropriate? Yes; Fluid consistency: Thin  Diet effective now                    Brief/Interim Summary: 89 y.o. male  with history of GERD, anxiety disorder, diabetes,anxiety/depression hiatal hernia OSA not on CPAP resident of ALF presented with fever, cough nausea vomiting diarrhea for a few days.  Patient also having weakness difficulty walking with walker has been not been able to eat or drink well. In the ED vitals stable afebrile.  Labs elevated BUN/creatinine 1.4, bicarb 17 leukocytosis 13.5 influenza A COVID and RSV negative, UA unremarkable. CXR>> Patchy pneumonic infiltrate  LLL,started on  antibiotics and admitted.  Overall patient remains clinically stable afebrile, not hypoxic.  Managed with IV antibiotics antitussives managed with IV antibiotics antitussives.  Seen by PT OT and advising return to ALF with home health.  Patient is currently stable for discharge to ALF    Discharge Diagnoses:  Principal Problem:   Sepsis due to pneumonia [facility]) Active Problems:   Diabetes (HCC)   GERD (gastroesophageal reflux disease)   Gastro-esophageal reflux disease with esophagitis   History of tobacco abuse   OSA (obstructive sleep apnea)   Essential hypertension   LLL pneumonia Sepsis POA-met criteria with tachycardia 102 leukocytosis pneumonia: Patient presented with respiratory symptoms, labs remarkable for leukocytosis,cxr personally reviewed and shows pneumonia.blood culture NGTD.  Leukocytosis down  trended.  Overall patient is clinically improved will complete antibiotics, prescribed antitussives for discharge to facility. he is doing well on room air Recent Labs  Lab 08/12/23 1854 08/12/23 1909 08/13/23 0117 08/14/23 0951  WBC 13.5*  --  11.8*  --   LATICACIDVEN  --  1.4  --  1.0    AKI with mild metabolic acidosis Hypokalemia: Elevated creatinine 1.4 on admission baseline around 0.9-1. Resolved. Hco3 coming up. Vbg with stable pH and normal lactic acid potassium replacement ordered  Encourage oral hydration at the facility Recent Labs    08/12/23 1854 08/13/23 0117 08/14/23 0436 08/15/23 0418  BUN 34*  --  20 18  CREATININE 1.40* 1.25* 1.03 0.71  CO2 17*  --  13* 16*  K 4.0  --  3.6 3.2*   Chronic CHF with preserved EF: In the remote past had reduced EF.  Will resume his home Entresto. PTA on prn Lasix.   T2DM: A1c 8.1 blood sugar stable keep on SSI.  Holding home meds Recent Labs  Lab 08/13/23 0117 08/13/23 0727 08/13/23 2129 08/14/23 0803 08/14/23 1219 08/14/23 1630 08/14/23 2057  GLUCAP  --    < > 83 90 190* 195* 121*  HGBA1C 8.0*  --   --   --   --   --   --    < > = values in this interval not displayed.    GERD: Continue PPI  Tobacco abuse: Cessation counseling and nicotine patch  Essential HTN: BP stable.  Meds on hold for now.  HLD: Resume statin  Class I Obesity OSA not using CPAP: Will  benefit with PCP follow-up, weight loss  healthy lifestyle and outpatient sleep evaluation.  Consults: None  Subjective: Alert awake oriented resting comfortably no complaint, afebrile overnight He says cough medicine has helped him and has minimal cough, he is on room air He is agreeable for discharge today  Discharge Exam: Vitals:   08/15/23 0810 08/15/23 1037  BP:  (!) 103/49  Pulse:  (!) 101  Resp:    Temp:  97.8 F (36.6 C)  SpO2: 92% 92%   General: Pt is alert, awake, not in acute distress Cardiovascular: RRR, S1/S2 +, no rubs, no  gallops Respiratory: CTA bilaterally, no wheezing, no rhonchi Abdominal: Soft, NT, ND, bowel sounds + Extremities: no edema, no cyanosis  Discharge Instructions  Discharge Instructions     Discharge instructions   Complete by: As directed    Please call call MD or return to ER for similar or worsening recurring problem that brought you to hospital or if any fever,nausea/vomiting,abdominal pain, uncontrolled pain, chest pain,  shortness of breath or any other alarming symptoms.  Please follow-up your doctor as instructed in a week time and call the office for appointment.  Please avoid alcohol, smoking, or any other illicit substance and maintain healthy habits including taking your regular medications as prescribed.  You were cared for by a hospitalist during your hospital stay. If you have any questions about your discharge medications or the care you received while you were in the hospital after you are discharged, you can call the unit and ask to speak with the hospitalist on call if the hospitalist that took care of you is not available.  Once you are discharged, your primary care physician will handle any further medical issues. Please note that NO REFILLS for any discharge medications will be authorized once you are discharged, as it is imperative that you return to your primary care physician (or establish a relationship with a primary care physician if you do not have one) for your aftercare needs so that they can reassess your need for medications and monitor your lab values   Increase activity slowly   Complete by: As directed       Allergies as of 08/15/2023       Reactions   Alendronate Sodium    Other reaction(s): diarrhea   Penicillin G    Other reaction(s): Unknown   Penicillins Hives   DID THE REACTION INVOLVE: Swelling of the face/tongue/throat, SOB, or low BP? No Sudden or severe rash/hives, skin peeling, or the inside of the mouth or nose? No Did it require  medical treatment? No When did it last happen?childhood allergy       If all above answers are "NO", may proceed with cephalosporin use.   Sulfa Antibiotics Hives   Sulfacetamide Sodium    Other reaction(s): Unknown        Medication List     TAKE these medications    acetaminophen 500 MG tablet Commonly known as: TYLENOL Take 650 mg by mouth 2 (two) times daily as needed for mild pain (pain score 1-3) or moderate pain (pain score 4-6).   aspirin 81 MG chewable tablet Chew 81 mg by mouth daily.   atorvastatin 40 MG tablet Commonly known as: LIPITOR Take 1 tablet (40 mg total) by mouth daily at 6 PM. What changed: when to take this   azithromycin 500 MG tablet Commonly known as: Zithromax Take 1 tablet (500 mg total) by mouth daily for 2 days.   brimonidine 0.2 %  ophthalmic solution Commonly known as: ALPHAGAN Place 1 drop into both eyes 2 (two) times daily.   carvedilol 6.25 MG tablet Commonly known as: COREG Take 6.25 mg by mouth 2 (two) times daily with a meal.   cefpodoxime 200 MG tablet Commonly known as: VANTIN Take 1 tablet (200 mg total) by mouth 2 (two) times daily for 5 days.   cetirizine 10 MG tablet Commonly known as: ZYRTEC Take 10 mg by mouth daily.   CHOLECALCIFEROL PO Take 1,000 mcg by mouth every morning.   Entresto 24-26 MG Generic drug: sacubitril-valsartan Take 1 tablet by mouth 2 (two) times daily.   Ferrous Sulfate Dried 143 (45 Fe) MG Tbcr Take 45 mg by mouth daily.   finasteride 5 MG tablet Commonly known as: PROSCAR Take 5 mg by mouth every evening.   furosemide 20 MG tablet Commonly known as: LASIX Take 20 mg by mouth daily as needed for fluid or edema.   guaiFENesin 600 MG 12 hr tablet Commonly known as: MUCINEX Take 1 tablet (600 mg total) by mouth 2 (two) times daily.   HYDROcodone bit-homatropine 5-1.5 MG/5ML syrup Commonly known as: HYCODAN Take 5 mLs by mouth every 6 (six) hours as needed for up to 3 days for  cough.   mirabegron ER 50 MG Tb24 tablet Commonly known as: MYRBETRIQ Take 50 mg by mouth daily.   potassium chloride 10 MEQ tablet Commonly known as: KLOR-CON Take 10 mEq by mouth daily.   PreserVision AREDS 2 Caps Take 1 capsule by mouth 2 (two) times daily.   saccharomyces boulardii 250 MG capsule Commonly known as: Florastor Take 1 capsule (250 mg total) by mouth 2 (two) times daily for 14 days.   sertraline 50 MG tablet Commonly known as: ZOLOFT Take 50 mg by mouth every evening.   Synjardy XR 25-1000 MG Tb24 Generic drug: Empagliflozin-metFORMIN HCl ER Take 1 tablet by mouth daily.   tamsulosin 0.4 MG Caps capsule Commonly known as: FLOMAX Take 0.4 mg by mouth at bedtime.   timolol 0.5 % ophthalmic solution Commonly known as: TIMOPTIC Place 1 drop into both eyes 2 (two) times daily.   Trijardy XR 25-10-998 MG Tb24 Generic drug: Empagliflozin-Linaglip-Metform Take 1 tablet by mouth daily.        Follow-up Information     Eloisa Northern, MD Follow up in 1 week(s).   Specialty: Internal Medicine Contact information: [address] [place] [address] [phone]                Allergies  Allergen Reactions   Alendronate Sodium     Other reaction(s): diarrhea   Penicillin G     Other reaction(s): Unknown   Penicillins Hives    DID THE REACTION INVOLVE: Swelling of the face/tongue/throat, SOB, or low BP? No Sudden or severe rash/hives, skin peeling, or the inside of the mouth or nose? No Did it require medical treatment? No When did it last happen?childhood allergy       If all above answers are "NO", may proceed with cephalosporin use.    Sulfa Antibiotics Hives   Sulfacetamide Sodium     Other reaction(s): Unknown    The results of significant diagnostics from this hospitalization (including imaging, microbiology, ancillary and laboratory) are listed below for reference.    Microbiology: Recent Results (from the past 240 hours)  Resp  panel by RT-PCR (RSV, Flu A&B, Covid) Anterior Nasal Swab     Status: None   Collection Time: 08/12/23  6:59 PM   Specimen: Anterior Nasal Swab  Result Value Ref Range Status   SARS Coronavirus 2 by RT PCR NEGATIVE NEGATIVE Final    Comment: (NOTE) SARS-CoV-2 target nucleic acids are NOT DETECTED.  The SARS-CoV-2 RNA is generally detectable in upper respiratory specimens during the acute phase of infection. The lowest concentration of SARS-CoV-2 viral copies this assay can detect is 138 copies/mL. A negative result does not preclude SARS-Cov-2 infection and should not be used as the sole basis for treatment or other patient management decisions. A negative result may occur with  improper specimen collection/handling, submission of specimen other than nasopharyngeal swab, presence of viral mutation(s) within the areas targeted by this assay, and inadequate number of viral copies(<138 copies/mL). A negative result must be combined with clinical observations, patient history, and epidemiological information. The expected result is Negative.  Fact Sheet for Patients:  BloggerCourse.com  Fact Sheet for Healthcare Providers:  SeriousBroker.it  This test is no t yet approved or cleared by the [place] FDA and  has been authorized for detection and/or diagnosis of SARS-CoV-2 by FDA under an Emergency Use Authorization (EUA). This EUA will remain  in effect (meaning this test can be used) for the duration of the COVID-19 declaration under Section 564(b)(1) of the Act, 21 U.S.C.section 360bbb-3(b)(1), unless the authorization is terminated  or revoked sooner.       Influenza A by PCR NEGATIVE NEGATIVE Final   Influenza B by PCR NEGATIVE NEGATIVE Final    Comment: (NOTE) The Xpert Xpress SARS-CoV-2/FLU/RSV plus assay is intended as an aid in the diagnosis of influenza from Nasopharyngeal swab specimens and should not be used as a  sole basis for treatment. Nasal washings and aspirates are unacceptable for Xpert Xpress SARS-CoV-2/FLU/RSV testing.  Fact Sheet for Patients: BloggerCourse.com  Fact Sheet for Healthcare Providers: SeriousBroker.it  This test is not yet approved or cleared by the [place] FDA and has been authorized for detection and/or diagnosis of SARS-CoV-2 by FDA under an Emergency Use Authorization (EUA). This EUA will remain in effect (meaning this test can be used) for the duration of the COVID-19 declaration under Section 564(b)(1) of the Act, 21 U.S.C. section 360bbb-3(b)(1), unless the authorization is terminated or revoked.     Resp Syncytial Virus by PCR NEGATIVE NEGATIVE Final    Comment: (NOTE) Fact Sheet for Patients: BloggerCourse.com  Fact Sheet for Healthcare Providers: SeriousBroker.it  This test is not yet approved or cleared by the [place] FDA and has been authorized for detection and/or diagnosis of SARS-CoV-2 by FDA under an Emergency Use Authorization (EUA). This EUA will remain in effect (meaning this test can be used) for the duration of the COVID-19 declaration under Section 564(b)(1) of the Act, 21 U.S.C. section 360bbb-3(b)(1), unless the authorization is terminated or revoked.  Performed at [facility], [address] [place], [place] [address]   Blood culture (routine x 2)     Status: None (Preliminary result)   Collection Time: 08/12/23  9:47 PM   Specimen: BLOOD  Result Value Ref Range Status   Specimen Description   Final    BLOOD RIGHT ANTECUBITAL Performed at [facility], [address], [place], [place] [address]    Special Requests   Final    BOTTLES DRAWN AEROBIC AND ANAEROBIC Blood Culture adequate volume Performed at [facility], [address], [place], [place] [address]     Culture   Final    NO GROWTH  2 DAYS Performed at [facility], [address] [place], [place] [address]    Report Status PENDING  Incomplete  Blood culture (routine x 2)     Status: None (Preliminary result)   Collection Time: 08/13/23  1:17 AM   Specimen: BLOOD LEFT ARM  Result Value Ref Range Status   Specimen Description   Final    BLOOD LEFT ARM Performed at [facility], [address] [place], [place] [address]    Special Requests   Final    BOTTLES DRAWN AEROBIC AND ANAEROBIC Blood Culture results may not be optimal due to an inadequate volume of blood received in culture bottles Performed at [facility], [address], [place], [place] [address]    Culture   Final    NO GROWTH 2 DAYS Performed at [facility], [address], [place], [place] [address]    Report Status PENDING  Incomplete    Procedures/Studies: DG Chest Port 1 View Result Date: 08/12/2023 CLINICAL DATA:  [id number]. Fever due to infection. Nausea, vomiting for few days. Flu and COVID negative. EXAM: PORTABLE CHEST 1 VIEW COMPARISON:  Portable chest 04/09/2021 FINDINGS: Lungs are expiratory. There is asymmetric increased opacity left lower lung field consistent with patchy pneumonic infiltrate. Remainder of the aerated lungs are visually clear but with limited view of the bases. The cardiomediastinal silhouette is stable. No vascular congestion is seen. There is mild aortic atherosclerosis and uncoiling. There is no substantial pleural effusion. There are chronic healed fractures of the right ribcage and bilateral moderate shoulder DJD, chronic rotator cuff arthropathy. IMPRESSION: 1. Patchy pneumonic infiltrate left lower lung field. Follow-up study recommended after treatment to ensure clearing. 2. Expiratory chest with no further acute radiographic findings. 3. Aortic atherosclerosis. Electronically Signed   By: Almira Bar M.D.   On: 08/12/2023 20:11    Labs: BNP (last  3 results) No results for input(s): "BNP" in the last 8760 hours. Basic Metabolic Panel: Recent Labs  Lab 08/12/23 1854 08/13/23 0117 08/14/23 0436 08/15/23 0418  NA 136  --  138 133*  K 4.0  --  3.6 3.2*  CL 105  --  109 105  CO2 17*  --  13* 16*  GLUCOSE 134*  --  119* 184*  BUN 34*  --  20 18  CREATININE 1.40* 1.25* 1.03 0.71  CALCIUM 8.3*  --  7.7* 7.6*   Liver Function Tests: Recent Labs  Lab 08/12/23 1854  AST 16  ALT 14  ALKPHOS 72  BILITOT 1.5*  PROT 7.1  ALBUMIN 3.6   No results for input(s): "LIPASE", "AMYLASE" in the last 168 hours. No results for input(s): "AMMONIA" in the last 168 hours. CBC: Recent Labs  Lab 08/12/23 1854 08/13/23 0117  WBC 13.5* 11.8*  NEUTROABS 11.3*  --   HGB 12.4* 11.3*  HCT 39.1 34.6*  MCV 91.1 89.9  PLT 198 170   Recent Labs  Lab 08/13/23 2129 08/14/23 0803 08/14/23 1219 08/14/23 1630 08/14/23 2057  GLUCAP 83 90 190* 195* 121*   Recent Labs    08/13/23 0117  HGBA1C 8.0*  Anemia work up No results for input(s): "VITAMINB12", "FOLATE", "FERRITIN", "TIBC", "IRON", "RETICCTPCT" in the last 72 hours. Urinalysis    Component Value Date/Time   COLORURINE YELLOW 08/12/2023 1854   APPEARANCEUR CLEAR 08/12/2023 1854   LABSPEC 1.014 08/12/2023 1854   PHURINE 5.0 08/12/2023 1854   GLUCOSEU >=500 (A) 08/12/2023 1854   HGBUR NEGATIVE  08/12/2023 1854   BILIRUBINUR NEGATIVE 08/12/2023 1854   KETONESUR 20 (A) 08/12/2023 1854   PROTEINUR NEGATIVE 08/12/2023 1854   UROBILINOGEN 0.2 01/16/2014 1252   NITRITE NEGATIVE 08/12/2023 1854   LEUKOCYTESUR TRACE (A) 08/12/2023 1854   Sepsis Labs Recent Labs  Lab 08/12/23 1854 08/13/23 0117  WBC 13.5* 11.8*   Microbiology Recent Results (from the past 240 hours)  Resp panel by RT-PCR (RSV, Flu A&B, Covid) Anterior Nasal Swab     Status: None   Collection Time: 08/12/23  6:59 PM   Specimen: Anterior Nasal Swab  Result Value Ref Range Status   SARS Coronavirus 2 by RT PCR  NEGATIVE NEGATIVE Final    Comment: (NOTE) SARS-CoV-2 target nucleic acids are NOT DETECTED.  The SARS-CoV-2 RNA is generally detectable in upper respiratory specimens during the acute phase of infection. The lowest concentration of SARS-CoV-2 viral copies this assay can detect is 138 copies/mL. A negative result does not preclude SARS-Cov-2 infection and should not be used as the sole basis for treatment or other patient management decisions. A negative result may occur with  improper specimen collection/handling, submission of specimen other than nasopharyngeal swab, presence of viral mutation(s) within the areas targeted by this assay, and inadequate number of viral copies(<138 copies/mL). A negative result must be combined with clinical observations, patient history, and epidemiological information. The expected result is Negative.  Fact Sheet for Patients:  BloggerCourse.com  Fact Sheet for Healthcare Providers:  SeriousBroker.it  This test is no t yet approved or cleared by the [place] FDA and  has been authorized for detection and/or diagnosis of SARS-CoV-2 by FDA under an Emergency Use Authorization (EUA). This EUA will remain  in effect (meaning this test can be used) for the duration of the COVID-19 declaration under Section 564(b)(1) of the Act, 21 U.S.C.section 360bbb-3(b)(1), unless the authorization is terminated  or revoked sooner.       Influenza A by PCR NEGATIVE NEGATIVE Final   Influenza B by PCR NEGATIVE NEGATIVE Final    Comment: (NOTE) The Xpert Xpress SARS-CoV-2/FLU/RSV plus assay is intended as an aid in the diagnosis of influenza from Nasopharyngeal swab specimens and should not be used as a sole basis for treatment. Nasal washings and aspirates are unacceptable for Xpert Xpress SARS-CoV-2/FLU/RSV testing.  Fact Sheet for Patients: BloggerCourse.com  Fact Sheet for  Healthcare Providers: SeriousBroker.it  This test is not yet approved or cleared by the [place] FDA and has been authorized for detection and/or diagnosis of SARS-CoV-2 by FDA under an Emergency Use Authorization (EUA). This EUA will remain in effect (meaning this test can be used) for the duration of the COVID-19 declaration under Section 564(b)(1) of the Act, 21 U.S.C. section 360bbb-3(b)(1), unless the authorization is terminated or revoked.     Resp Syncytial Virus by PCR NEGATIVE NEGATIVE Final    Comment: (NOTE) Fact Sheet for Patients: BloggerCourse.com  Fact Sheet for Healthcare Providers: SeriousBroker.it  This test is not yet approved or cleared by the [place] FDA and has been authorized for detection and/or diagnosis of SARS-CoV-2 by FDA under an Emergency Use Authorization (EUA). This EUA will remain in effect (meaning this test can be used) for the duration of the COVID-19 declaration under Section 564(b)(1) of the Act, 21 U.S.C. section 360bbb-3(b)(1), unless the authorization is terminated or revoked.  Performed at [facility], [address], [place], [place] [address]   Blood culture (routine x 2)     Status: None (Preliminary  result)   Collection Time: 08/12/23  9:47 PM   Specimen: BLOOD  Result Value Ref Range Status   Specimen Description   Final    BLOOD RIGHT ANTECUBITAL Performed at [facility], [address], [place], [place] [address]    Special Requests   Final    BOTTLES DRAWN AEROBIC AND ANAEROBIC Blood Culture adequate volume Performed at [facility], [address] [place], [place] [address]    Culture   Final    NO GROWTH 2 DAYS Performed at [facility], [address] [place], [place] [address]    Report Status PENDING  Incomplete  Blood culture (routine x 2)     Status: None  (Preliminary result)   Collection Time: 08/13/23  1:17 AM   Specimen: BLOOD LEFT ARM  Result Value Ref Range Status   Specimen Description   Final    BLOOD LEFT ARM Performed at [facility], [address], [place], [place] [address]    Special Requests   Final    BOTTLES DRAWN AEROBIC AND ANAEROBIC Blood Culture results may not be optimal due to an inadequate volume of blood received in culture bottles Performed at [facility], [address], [place], [place] [address]    Culture   Final    NO GROWTH 2 DAYS Performed at [facility], [address] [place], [place] [address]    Report Status PENDING  Incomplete   Time coordinating discharge: 35 minutes  SIGNED: Lanae Boast, MD  [facility] 08/15/2023, 1:59 PM  If 7PM-7AM, please contact night-coverage [email or web address]

## 2023-08-15 NOTE — Plan of Care (Signed)
 Pt discharged to [facility] assisted facility picked up by [facility]. Report given to [employer]. Discharge education given to ambulance personnel. IV removed. Vitals stable. Problem: Education: Goal: Knowledge of General Education information will improve Description: Including pain rating scale, medication(s)/side effects and non-pharmacologic comfort measures Outcome: Adequate for Discharge   Problem: Health Behavior/Discharge Planning: Goal: Ability to manage health-related needs will improve Outcome: Adequate for Discharge   Problem: Clinical Measurements: Goal: Ability to maintain clinical measurements within normal limits will improve Outcome: Adequate for Discharge Goal: Will remain free from infection Outcome: Adequate for Discharge Goal: Diagnostic test results will improve Outcome: Adequate for Discharge Goal: Respiratory complications will improve Outcome: Adequate for Discharge Goal: Cardiovascular complication will be avoided Outcome: Adequate for Discharge   Problem: Activity: Goal: Risk for activity intolerance will decrease Outcome: Adequate for Discharge   Problem: Nutrition: Goal: Adequate nutrition will be maintained Outcome: Adequate for Discharge   Problem: Coping: Goal: Level of anxiety will decrease Outcome: Adequate for Discharge   Problem: Elimination: Goal: Will not experience complications related to bowel motility Outcome: Adequate for Discharge Goal: Will not experience complications related to urinary retention Outcome: Adequate for Discharge   Problem: Pain Managment: Goal: General experience of comfort will improve and/or be controlled Outcome: Adequate for Discharge   Problem: Safety: Goal: Ability to remain free from injury will improve Outcome: Adequate for Discharge   Problem: Skin Integrity: Goal: Risk for impaired skin integrity will decrease Outcome: Adequate for Discharge   Problem: Education: Goal: Ability to describe  self-care measures that may prevent or decrease complications (Diabetes Survival Skills Education) will improve Outcome: Adequate for Discharge Goal: Individualized Educational Video(s) Outcome: Adequate for Discharge   Problem: Coping: Goal: Ability to adjust to condition or change in health will improve Outcome: Adequate for Discharge   Problem: Fluid Volume: Goal: Ability to maintain a balanced intake and output will improve Outcome: Adequate for Discharge   Problem: Health Behavior/Discharge Planning: Goal: Ability to identify and utilize available resources and services will improve Outcome: Adequate for Discharge Goal: Ability to manage health-related needs will improve Outcome: Adequate for Discharge   Problem: Metabolic: Goal: Ability to maintain appropriate glucose levels will improve Outcome: Adequate for Discharge   Problem: Nutritional: Goal: Maintenance of adequate nutrition will improve Outcome: Adequate for Discharge Goal: Progress toward achieving an optimal weight will improve Outcome: Adequate for Discharge   Problem: Skin Integrity: Goal: Risk for impaired skin integrity will decrease Outcome: Adequate for Discharge   Problem: Tissue Perfusion: Goal: Adequacy of tissue perfusion will improve Outcome: Adequate for Discharge   Problem: Fluid Volume: Goal: Hemodynamic stability will improve Outcome: Adequate for Discharge   Problem: Clinical Measurements: Goal: Diagnostic test results will improve Outcome: Adequate for Discharge Goal: Signs and symptoms of infection will decrease Outcome: Adequate for Discharge   Problem: Respiratory: Goal: Ability to maintain adequate ventilation will improve Outcome: Adequate for Discharge

## 2023-08-18 DIAGNOSIS — E119 Type 2 diabetes mellitus without complications: Secondary | ICD-10-CM | POA: Diagnosis not present

## 2023-08-18 DIAGNOSIS — M6281 Muscle weakness (generalized): Secondary | ICD-10-CM | POA: Diagnosis not present

## 2023-08-18 DIAGNOSIS — R2681 Unsteadiness on feet: Secondary | ICD-10-CM | POA: Diagnosis not present

## 2023-08-18 DIAGNOSIS — H409 Unspecified glaucoma: Secondary | ICD-10-CM | POA: Diagnosis not present

## 2023-08-18 DIAGNOSIS — J209 Acute bronchitis, unspecified: Secondary | ICD-10-CM | POA: Diagnosis not present

## 2023-08-18 DIAGNOSIS — A403 Sepsis due to Streptococcus pneumoniae: Secondary | ICD-10-CM | POA: Diagnosis not present

## 2023-08-18 LAB — CULTURE, BLOOD (ROUTINE X 2)
Culture: NO GROWTH
Culture: NO GROWTH
Special Requests: ADEQUATE

## 2023-08-19 DIAGNOSIS — M6281 Muscle weakness (generalized): Secondary | ICD-10-CM | POA: Diagnosis not present

## 2023-08-19 DIAGNOSIS — J209 Acute bronchitis, unspecified: Secondary | ICD-10-CM | POA: Diagnosis not present

## 2023-08-19 DIAGNOSIS — E119 Type 2 diabetes mellitus without complications: Secondary | ICD-10-CM | POA: Diagnosis not present

## 2023-08-19 DIAGNOSIS — A403 Sepsis due to Streptococcus pneumoniae: Secondary | ICD-10-CM | POA: Diagnosis not present

## 2023-08-19 DIAGNOSIS — H409 Unspecified glaucoma: Secondary | ICD-10-CM | POA: Diagnosis not present

## 2023-08-19 DIAGNOSIS — R2681 Unsteadiness on feet: Secondary | ICD-10-CM | POA: Diagnosis not present

## 2023-08-22 DIAGNOSIS — J209 Acute bronchitis, unspecified: Secondary | ICD-10-CM | POA: Diagnosis not present

## 2023-08-22 DIAGNOSIS — H409 Unspecified glaucoma: Secondary | ICD-10-CM | POA: Diagnosis not present

## 2023-08-22 DIAGNOSIS — E119 Type 2 diabetes mellitus without complications: Secondary | ICD-10-CM | POA: Diagnosis not present

## 2023-08-22 DIAGNOSIS — A403 Sepsis due to Streptococcus pneumoniae: Secondary | ICD-10-CM | POA: Diagnosis not present

## 2023-08-22 DIAGNOSIS — R2681 Unsteadiness on feet: Secondary | ICD-10-CM | POA: Diagnosis not present

## 2023-08-22 DIAGNOSIS — M6281 Muscle weakness (generalized): Secondary | ICD-10-CM | POA: Diagnosis not present

## 2023-08-25 DIAGNOSIS — H409 Unspecified glaucoma: Secondary | ICD-10-CM | POA: Diagnosis not present

## 2023-08-25 DIAGNOSIS — M6281 Muscle weakness (generalized): Secondary | ICD-10-CM | POA: Diagnosis not present

## 2023-08-25 DIAGNOSIS — R2681 Unsteadiness on feet: Secondary | ICD-10-CM | POA: Diagnosis not present

## 2023-08-25 DIAGNOSIS — E119 Type 2 diabetes mellitus without complications: Secondary | ICD-10-CM | POA: Diagnosis not present

## 2023-08-25 DIAGNOSIS — J209 Acute bronchitis, unspecified: Secondary | ICD-10-CM | POA: Diagnosis not present

## 2023-08-25 DIAGNOSIS — R41841 Cognitive communication deficit: Secondary | ICD-10-CM | POA: Diagnosis not present

## 2023-08-25 DIAGNOSIS — A403 Sepsis due to Streptococcus pneumoniae: Secondary | ICD-10-CM | POA: Diagnosis not present

## 2023-08-26 DIAGNOSIS — H409 Unspecified glaucoma: Secondary | ICD-10-CM | POA: Diagnosis not present

## 2023-08-26 DIAGNOSIS — R41841 Cognitive communication deficit: Secondary | ICD-10-CM | POA: Diagnosis not present

## 2023-08-26 DIAGNOSIS — M6281 Muscle weakness (generalized): Secondary | ICD-10-CM | POA: Diagnosis not present

## 2023-08-26 DIAGNOSIS — A403 Sepsis due to Streptococcus pneumoniae: Secondary | ICD-10-CM | POA: Diagnosis not present

## 2023-08-26 DIAGNOSIS — J209 Acute bronchitis, unspecified: Secondary | ICD-10-CM | POA: Diagnosis not present

## 2023-08-26 DIAGNOSIS — R2681 Unsteadiness on feet: Secondary | ICD-10-CM | POA: Diagnosis not present

## 2023-08-26 DIAGNOSIS — E119 Type 2 diabetes mellitus without complications: Secondary | ICD-10-CM | POA: Diagnosis not present

## 2023-08-28 DIAGNOSIS — H409 Unspecified glaucoma: Secondary | ICD-10-CM | POA: Diagnosis not present

## 2023-08-28 DIAGNOSIS — M6281 Muscle weakness (generalized): Secondary | ICD-10-CM | POA: Diagnosis not present

## 2023-08-28 DIAGNOSIS — R41841 Cognitive communication deficit: Secondary | ICD-10-CM | POA: Diagnosis not present

## 2023-08-28 DIAGNOSIS — E119 Type 2 diabetes mellitus without complications: Secondary | ICD-10-CM | POA: Diagnosis not present

## 2023-08-28 DIAGNOSIS — A403 Sepsis due to Streptococcus pneumoniae: Secondary | ICD-10-CM | POA: Diagnosis not present

## 2023-08-28 DIAGNOSIS — J209 Acute bronchitis, unspecified: Secondary | ICD-10-CM | POA: Diagnosis not present

## 2023-08-28 DIAGNOSIS — R2681 Unsteadiness on feet: Secondary | ICD-10-CM | POA: Diagnosis not present

## 2023-08-29 DIAGNOSIS — R2681 Unsteadiness on feet: Secondary | ICD-10-CM | POA: Diagnosis not present

## 2023-08-29 DIAGNOSIS — A403 Sepsis due to Streptococcus pneumoniae: Secondary | ICD-10-CM | POA: Diagnosis not present

## 2023-08-29 DIAGNOSIS — E119 Type 2 diabetes mellitus without complications: Secondary | ICD-10-CM | POA: Diagnosis not present

## 2023-08-29 DIAGNOSIS — R41841 Cognitive communication deficit: Secondary | ICD-10-CM | POA: Diagnosis not present

## 2023-08-29 DIAGNOSIS — J209 Acute bronchitis, unspecified: Secondary | ICD-10-CM | POA: Diagnosis not present

## 2023-08-29 DIAGNOSIS — M6281 Muscle weakness (generalized): Secondary | ICD-10-CM | POA: Diagnosis not present

## 2023-08-29 DIAGNOSIS — H409 Unspecified glaucoma: Secondary | ICD-10-CM | POA: Diagnosis not present

## 2023-09-01 ENCOUNTER — Ambulatory Visit: Payer: Medicare PPO | Admitting: Podiatry

## 2023-09-01 DIAGNOSIS — H409 Unspecified glaucoma: Secondary | ICD-10-CM | POA: Diagnosis not present

## 2023-09-01 DIAGNOSIS — M6281 Muscle weakness (generalized): Secondary | ICD-10-CM | POA: Diagnosis not present

## 2023-09-01 DIAGNOSIS — A403 Sepsis due to Streptococcus pneumoniae: Secondary | ICD-10-CM | POA: Diagnosis not present

## 2023-09-01 DIAGNOSIS — E119 Type 2 diabetes mellitus without complications: Secondary | ICD-10-CM | POA: Diagnosis not present

## 2023-09-01 DIAGNOSIS — R41841 Cognitive communication deficit: Secondary | ICD-10-CM | POA: Diagnosis not present

## 2023-09-01 DIAGNOSIS — J209 Acute bronchitis, unspecified: Secondary | ICD-10-CM | POA: Diagnosis not present

## 2023-09-01 DIAGNOSIS — R2681 Unsteadiness on feet: Secondary | ICD-10-CM | POA: Diagnosis not present

## 2023-09-02 DIAGNOSIS — R41841 Cognitive communication deficit: Secondary | ICD-10-CM | POA: Diagnosis not present

## 2023-09-02 DIAGNOSIS — R2681 Unsteadiness on feet: Secondary | ICD-10-CM | POA: Diagnosis not present

## 2023-09-02 DIAGNOSIS — A403 Sepsis due to Streptococcus pneumoniae: Secondary | ICD-10-CM | POA: Diagnosis not present

## 2023-09-02 DIAGNOSIS — H409 Unspecified glaucoma: Secondary | ICD-10-CM | POA: Diagnosis not present

## 2023-09-02 DIAGNOSIS — M6281 Muscle weakness (generalized): Secondary | ICD-10-CM | POA: Diagnosis not present

## 2023-09-02 DIAGNOSIS — J209 Acute bronchitis, unspecified: Secondary | ICD-10-CM | POA: Diagnosis not present

## 2023-09-02 DIAGNOSIS — E119 Type 2 diabetes mellitus without complications: Secondary | ICD-10-CM | POA: Diagnosis not present

## 2023-09-04 ENCOUNTER — Encounter: Payer: Self-pay | Admitting: Podiatry

## 2023-09-04 ENCOUNTER — Ambulatory Visit (INDEPENDENT_AMBULATORY_CARE_PROVIDER_SITE_OTHER): Admitting: Podiatry

## 2023-09-04 VITALS — Ht 59.0 in | Wt 153.0 lb

## 2023-09-04 DIAGNOSIS — B351 Tinea unguium: Secondary | ICD-10-CM

## 2023-09-04 DIAGNOSIS — A403 Sepsis due to Streptococcus pneumoniae: Secondary | ICD-10-CM | POA: Diagnosis not present

## 2023-09-04 DIAGNOSIS — R41841 Cognitive communication deficit: Secondary | ICD-10-CM | POA: Diagnosis not present

## 2023-09-04 DIAGNOSIS — H409 Unspecified glaucoma: Secondary | ICD-10-CM | POA: Diagnosis not present

## 2023-09-04 DIAGNOSIS — M79674 Pain in right toe(s): Secondary | ICD-10-CM | POA: Diagnosis not present

## 2023-09-04 DIAGNOSIS — R2681 Unsteadiness on feet: Secondary | ICD-10-CM | POA: Diagnosis not present

## 2023-09-04 DIAGNOSIS — M6281 Muscle weakness (generalized): Secondary | ICD-10-CM | POA: Diagnosis not present

## 2023-09-04 DIAGNOSIS — Z794 Long term (current) use of insulin: Secondary | ICD-10-CM

## 2023-09-04 DIAGNOSIS — M79675 Pain in left toe(s): Secondary | ICD-10-CM | POA: Diagnosis not present

## 2023-09-04 DIAGNOSIS — E119 Type 2 diabetes mellitus without complications: Secondary | ICD-10-CM

## 2023-09-04 DIAGNOSIS — J209 Acute bronchitis, unspecified: Secondary | ICD-10-CM | POA: Diagnosis not present

## 2023-09-04 NOTE — Progress Notes (Signed)
 This patient returns to my office for at risk foot care.  This patient requires this care by a professional since this patient will be at risk due to having PAD and DM.  This patient is unable to cut nails himself since the patient cannot reach his nails.These nails are painful walking and wearing shoes.  This patient presents for at risk foot care today.  General Appearance  Alert, conversant and in no acute stress.  Vascular  Dorsalis pedis and posterior tibial  pulses are  weakly palpable  bilaterally.  Capillary return is within normal limits  bilaterally. Temperature is within normal limits  bilaterally.  Neurologic  Senn-Weinstein monofilament wire test within normal limits  bilaterally. Muscle power within normal limits bilaterally.  Nails Thick disfigured discolored nails with subungual debris  from hallux to fifth toes bilaterally. No evidence of bacterial infection or drainage bilaterally.  Orthopedic  No limitations of motion  feet .  No crepitus or effusions noted.  No bony pathology or digital deformities noted.  Skin  normotropic skin with no porokeratosis noted bilaterally.  No signs of infections or ulcers noted.     Onychomycosis  Pain in right toes  Pain in left toes  Consent was obtained for treatment procedures.   Mechanical debridement of nails 1-5  bilaterally performed with a nail nipper.  Filed with dremel without incident.    Return office visit    3 months                  Told patient to return for periodic foot care and evaluation due to potential at risk complications.   Helane Gunther DPM

## 2023-09-05 DIAGNOSIS — A403 Sepsis due to Streptococcus pneumoniae: Secondary | ICD-10-CM | POA: Diagnosis not present

## 2023-09-05 DIAGNOSIS — R41841 Cognitive communication deficit: Secondary | ICD-10-CM | POA: Diagnosis not present

## 2023-09-05 DIAGNOSIS — J209 Acute bronchitis, unspecified: Secondary | ICD-10-CM | POA: Diagnosis not present

## 2023-09-05 DIAGNOSIS — H409 Unspecified glaucoma: Secondary | ICD-10-CM | POA: Diagnosis not present

## 2023-09-05 DIAGNOSIS — E119 Type 2 diabetes mellitus without complications: Secondary | ICD-10-CM | POA: Diagnosis not present

## 2023-09-05 DIAGNOSIS — M6281 Muscle weakness (generalized): Secondary | ICD-10-CM | POA: Diagnosis not present

## 2023-09-05 DIAGNOSIS — R2681 Unsteadiness on feet: Secondary | ICD-10-CM | POA: Diagnosis not present

## 2023-09-08 DIAGNOSIS — M6281 Muscle weakness (generalized): Secondary | ICD-10-CM | POA: Diagnosis not present

## 2023-09-08 DIAGNOSIS — E119 Type 2 diabetes mellitus without complications: Secondary | ICD-10-CM | POA: Diagnosis not present

## 2023-09-08 DIAGNOSIS — R2681 Unsteadiness on feet: Secondary | ICD-10-CM | POA: Diagnosis not present

## 2023-09-08 DIAGNOSIS — J209 Acute bronchitis, unspecified: Secondary | ICD-10-CM | POA: Diagnosis not present

## 2023-09-08 DIAGNOSIS — H409 Unspecified glaucoma: Secondary | ICD-10-CM | POA: Diagnosis not present

## 2023-09-08 DIAGNOSIS — R41841 Cognitive communication deficit: Secondary | ICD-10-CM | POA: Diagnosis not present

## 2023-09-08 DIAGNOSIS — A403 Sepsis due to Streptococcus pneumoniae: Secondary | ICD-10-CM | POA: Diagnosis not present

## 2023-09-09 DIAGNOSIS — J209 Acute bronchitis, unspecified: Secondary | ICD-10-CM | POA: Diagnosis not present

## 2023-09-09 DIAGNOSIS — R2681 Unsteadiness on feet: Secondary | ICD-10-CM | POA: Diagnosis not present

## 2023-09-09 DIAGNOSIS — M6281 Muscle weakness (generalized): Secondary | ICD-10-CM | POA: Diagnosis not present

## 2023-09-09 DIAGNOSIS — H409 Unspecified glaucoma: Secondary | ICD-10-CM | POA: Diagnosis not present

## 2023-09-09 DIAGNOSIS — R41841 Cognitive communication deficit: Secondary | ICD-10-CM | POA: Diagnosis not present

## 2023-09-09 DIAGNOSIS — E119 Type 2 diabetes mellitus without complications: Secondary | ICD-10-CM | POA: Diagnosis not present

## 2023-09-09 DIAGNOSIS — A403 Sepsis due to Streptococcus pneumoniae: Secondary | ICD-10-CM | POA: Diagnosis not present

## 2023-09-11 DIAGNOSIS — J209 Acute bronchitis, unspecified: Secondary | ICD-10-CM | POA: Diagnosis not present

## 2023-09-11 DIAGNOSIS — M6281 Muscle weakness (generalized): Secondary | ICD-10-CM | POA: Diagnosis not present

## 2023-09-11 DIAGNOSIS — R41841 Cognitive communication deficit: Secondary | ICD-10-CM | POA: Diagnosis not present

## 2023-09-11 DIAGNOSIS — E119 Type 2 diabetes mellitus without complications: Secondary | ICD-10-CM | POA: Diagnosis not present

## 2023-09-11 DIAGNOSIS — A403 Sepsis due to Streptococcus pneumoniae: Secondary | ICD-10-CM | POA: Diagnosis not present

## 2023-09-11 DIAGNOSIS — R2681 Unsteadiness on feet: Secondary | ICD-10-CM | POA: Diagnosis not present

## 2023-09-11 DIAGNOSIS — H409 Unspecified glaucoma: Secondary | ICD-10-CM | POA: Diagnosis not present

## 2023-09-12 DIAGNOSIS — E119 Type 2 diabetes mellitus without complications: Secondary | ICD-10-CM | POA: Diagnosis not present

## 2023-09-12 DIAGNOSIS — H409 Unspecified glaucoma: Secondary | ICD-10-CM | POA: Diagnosis not present

## 2023-09-12 DIAGNOSIS — R41841 Cognitive communication deficit: Secondary | ICD-10-CM | POA: Diagnosis not present

## 2023-09-12 DIAGNOSIS — R2681 Unsteadiness on feet: Secondary | ICD-10-CM | POA: Diagnosis not present

## 2023-09-12 DIAGNOSIS — A403 Sepsis due to Streptococcus pneumoniae: Secondary | ICD-10-CM | POA: Diagnosis not present

## 2023-09-12 DIAGNOSIS — M6281 Muscle weakness (generalized): Secondary | ICD-10-CM | POA: Diagnosis not present

## 2023-09-12 DIAGNOSIS — J209 Acute bronchitis, unspecified: Secondary | ICD-10-CM | POA: Diagnosis not present

## 2023-09-15 DIAGNOSIS — J209 Acute bronchitis, unspecified: Secondary | ICD-10-CM | POA: Diagnosis not present

## 2023-09-15 DIAGNOSIS — R41841 Cognitive communication deficit: Secondary | ICD-10-CM | POA: Diagnosis not present

## 2023-09-15 DIAGNOSIS — R2681 Unsteadiness on feet: Secondary | ICD-10-CM | POA: Diagnosis not present

## 2023-09-15 DIAGNOSIS — E119 Type 2 diabetes mellitus without complications: Secondary | ICD-10-CM | POA: Diagnosis not present

## 2023-09-15 DIAGNOSIS — A403 Sepsis due to Streptococcus pneumoniae: Secondary | ICD-10-CM | POA: Diagnosis not present

## 2023-09-15 DIAGNOSIS — H409 Unspecified glaucoma: Secondary | ICD-10-CM | POA: Diagnosis not present

## 2023-09-15 DIAGNOSIS — M6281 Muscle weakness (generalized): Secondary | ICD-10-CM | POA: Diagnosis not present

## 2023-09-16 DIAGNOSIS — M6281 Muscle weakness (generalized): Secondary | ICD-10-CM | POA: Diagnosis not present

## 2023-09-16 DIAGNOSIS — R2681 Unsteadiness on feet: Secondary | ICD-10-CM | POA: Diagnosis not present

## 2023-09-16 DIAGNOSIS — H409 Unspecified glaucoma: Secondary | ICD-10-CM | POA: Diagnosis not present

## 2023-09-16 DIAGNOSIS — E119 Type 2 diabetes mellitus without complications: Secondary | ICD-10-CM | POA: Diagnosis not present

## 2023-09-16 DIAGNOSIS — J209 Acute bronchitis, unspecified: Secondary | ICD-10-CM | POA: Diagnosis not present

## 2023-09-16 DIAGNOSIS — R41841 Cognitive communication deficit: Secondary | ICD-10-CM | POA: Diagnosis not present

## 2023-09-16 DIAGNOSIS — A403 Sepsis due to Streptococcus pneumoniae: Secondary | ICD-10-CM | POA: Diagnosis not present

## 2023-09-18 DIAGNOSIS — H409 Unspecified glaucoma: Secondary | ICD-10-CM | POA: Diagnosis not present

## 2023-09-18 DIAGNOSIS — A403 Sepsis due to Streptococcus pneumoniae: Secondary | ICD-10-CM | POA: Diagnosis not present

## 2023-09-18 DIAGNOSIS — R41841 Cognitive communication deficit: Secondary | ICD-10-CM | POA: Diagnosis not present

## 2023-09-18 DIAGNOSIS — R2681 Unsteadiness on feet: Secondary | ICD-10-CM | POA: Diagnosis not present

## 2023-09-18 DIAGNOSIS — M6281 Muscle weakness (generalized): Secondary | ICD-10-CM | POA: Diagnosis not present

## 2023-09-18 DIAGNOSIS — E119 Type 2 diabetes mellitus without complications: Secondary | ICD-10-CM | POA: Diagnosis not present

## 2023-09-18 DIAGNOSIS — J209 Acute bronchitis, unspecified: Secondary | ICD-10-CM | POA: Diagnosis not present

## 2023-09-19 DIAGNOSIS — A403 Sepsis due to Streptococcus pneumoniae: Secondary | ICD-10-CM | POA: Diagnosis not present

## 2023-09-19 DIAGNOSIS — R41841 Cognitive communication deficit: Secondary | ICD-10-CM | POA: Diagnosis not present

## 2023-09-19 DIAGNOSIS — E119 Type 2 diabetes mellitus without complications: Secondary | ICD-10-CM | POA: Diagnosis not present

## 2023-09-19 DIAGNOSIS — R2681 Unsteadiness on feet: Secondary | ICD-10-CM | POA: Diagnosis not present

## 2023-09-19 DIAGNOSIS — J209 Acute bronchitis, unspecified: Secondary | ICD-10-CM | POA: Diagnosis not present

## 2023-09-19 DIAGNOSIS — M6281 Muscle weakness (generalized): Secondary | ICD-10-CM | POA: Diagnosis not present

## 2023-09-19 DIAGNOSIS — H409 Unspecified glaucoma: Secondary | ICD-10-CM | POA: Diagnosis not present

## 2023-09-22 DIAGNOSIS — H409 Unspecified glaucoma: Secondary | ICD-10-CM | POA: Diagnosis not present

## 2023-09-22 DIAGNOSIS — A403 Sepsis due to Streptococcus pneumoniae: Secondary | ICD-10-CM | POA: Diagnosis not present

## 2023-09-22 DIAGNOSIS — E119 Type 2 diabetes mellitus without complications: Secondary | ICD-10-CM | POA: Diagnosis not present

## 2023-09-22 DIAGNOSIS — R2681 Unsteadiness on feet: Secondary | ICD-10-CM | POA: Diagnosis not present

## 2023-09-22 DIAGNOSIS — M6281 Muscle weakness (generalized): Secondary | ICD-10-CM | POA: Diagnosis not present

## 2023-09-22 DIAGNOSIS — J209 Acute bronchitis, unspecified: Secondary | ICD-10-CM | POA: Diagnosis not present

## 2023-09-22 DIAGNOSIS — R41841 Cognitive communication deficit: Secondary | ICD-10-CM | POA: Diagnosis not present

## 2023-09-23 DIAGNOSIS — R2681 Unsteadiness on feet: Secondary | ICD-10-CM | POA: Diagnosis not present

## 2023-09-23 DIAGNOSIS — M6281 Muscle weakness (generalized): Secondary | ICD-10-CM | POA: Diagnosis not present

## 2023-09-23 DIAGNOSIS — E119 Type 2 diabetes mellitus without complications: Secondary | ICD-10-CM | POA: Diagnosis not present

## 2023-09-23 DIAGNOSIS — R41841 Cognitive communication deficit: Secondary | ICD-10-CM | POA: Diagnosis not present

## 2023-09-23 DIAGNOSIS — J209 Acute bronchitis, unspecified: Secondary | ICD-10-CM | POA: Diagnosis not present

## 2023-09-23 DIAGNOSIS — A403 Sepsis due to Streptococcus pneumoniae: Secondary | ICD-10-CM | POA: Diagnosis not present

## 2023-09-24 DIAGNOSIS — R2681 Unsteadiness on feet: Secondary | ICD-10-CM | POA: Diagnosis not present

## 2023-09-24 DIAGNOSIS — E119 Type 2 diabetes mellitus without complications: Secondary | ICD-10-CM | POA: Diagnosis not present

## 2023-09-24 DIAGNOSIS — J209 Acute bronchitis, unspecified: Secondary | ICD-10-CM | POA: Diagnosis not present

## 2023-09-24 DIAGNOSIS — R41841 Cognitive communication deficit: Secondary | ICD-10-CM | POA: Diagnosis not present

## 2023-09-24 DIAGNOSIS — M6281 Muscle weakness (generalized): Secondary | ICD-10-CM | POA: Diagnosis not present

## 2023-09-24 DIAGNOSIS — A403 Sepsis due to Streptococcus pneumoniae: Secondary | ICD-10-CM | POA: Diagnosis not present

## 2023-09-25 DIAGNOSIS — R41841 Cognitive communication deficit: Secondary | ICD-10-CM | POA: Diagnosis not present

## 2023-09-25 DIAGNOSIS — J209 Acute bronchitis, unspecified: Secondary | ICD-10-CM | POA: Diagnosis not present

## 2023-09-25 DIAGNOSIS — E119 Type 2 diabetes mellitus without complications: Secondary | ICD-10-CM | POA: Diagnosis not present

## 2023-09-25 DIAGNOSIS — R2681 Unsteadiness on feet: Secondary | ICD-10-CM | POA: Diagnosis not present

## 2023-09-25 DIAGNOSIS — M6281 Muscle weakness (generalized): Secondary | ICD-10-CM | POA: Diagnosis not present

## 2023-09-25 DIAGNOSIS — A403 Sepsis due to Streptococcus pneumoniae: Secondary | ICD-10-CM | POA: Diagnosis not present

## 2023-09-26 DIAGNOSIS — R2681 Unsteadiness on feet: Secondary | ICD-10-CM | POA: Diagnosis not present

## 2023-09-26 DIAGNOSIS — A403 Sepsis due to Streptococcus pneumoniae: Secondary | ICD-10-CM | POA: Diagnosis not present

## 2023-09-26 DIAGNOSIS — R41841 Cognitive communication deficit: Secondary | ICD-10-CM | POA: Diagnosis not present

## 2023-09-26 DIAGNOSIS — M6281 Muscle weakness (generalized): Secondary | ICD-10-CM | POA: Diagnosis not present

## 2023-09-26 DIAGNOSIS — J209 Acute bronchitis, unspecified: Secondary | ICD-10-CM | POA: Diagnosis not present

## 2023-09-26 DIAGNOSIS — E119 Type 2 diabetes mellitus without complications: Secondary | ICD-10-CM | POA: Diagnosis not present

## 2023-09-29 DIAGNOSIS — M6281 Muscle weakness (generalized): Secondary | ICD-10-CM | POA: Diagnosis not present

## 2023-09-29 DIAGNOSIS — R2681 Unsteadiness on feet: Secondary | ICD-10-CM | POA: Diagnosis not present

## 2023-09-29 DIAGNOSIS — A403 Sepsis due to Streptococcus pneumoniae: Secondary | ICD-10-CM | POA: Diagnosis not present

## 2023-09-29 DIAGNOSIS — E119 Type 2 diabetes mellitus without complications: Secondary | ICD-10-CM | POA: Diagnosis not present

## 2023-09-29 DIAGNOSIS — R41841 Cognitive communication deficit: Secondary | ICD-10-CM | POA: Diagnosis not present

## 2023-09-29 DIAGNOSIS — J209 Acute bronchitis, unspecified: Secondary | ICD-10-CM | POA: Diagnosis not present

## 2023-09-30 DIAGNOSIS — R2681 Unsteadiness on feet: Secondary | ICD-10-CM | POA: Diagnosis not present

## 2023-09-30 DIAGNOSIS — R41841 Cognitive communication deficit: Secondary | ICD-10-CM | POA: Diagnosis not present

## 2023-09-30 DIAGNOSIS — E119 Type 2 diabetes mellitus without complications: Secondary | ICD-10-CM | POA: Diagnosis not present

## 2023-09-30 DIAGNOSIS — J209 Acute bronchitis, unspecified: Secondary | ICD-10-CM | POA: Diagnosis not present

## 2023-09-30 DIAGNOSIS — M6281 Muscle weakness (generalized): Secondary | ICD-10-CM | POA: Diagnosis not present

## 2023-09-30 DIAGNOSIS — A403 Sepsis due to Streptococcus pneumoniae: Secondary | ICD-10-CM | POA: Diagnosis not present

## 2023-10-01 DIAGNOSIS — M6281 Muscle weakness (generalized): Secondary | ICD-10-CM | POA: Diagnosis not present

## 2023-10-01 DIAGNOSIS — E119 Type 2 diabetes mellitus without complications: Secondary | ICD-10-CM | POA: Diagnosis not present

## 2023-10-01 DIAGNOSIS — A403 Sepsis due to Streptococcus pneumoniae: Secondary | ICD-10-CM | POA: Diagnosis not present

## 2023-10-01 DIAGNOSIS — J209 Acute bronchitis, unspecified: Secondary | ICD-10-CM | POA: Diagnosis not present

## 2023-10-01 DIAGNOSIS — R41841 Cognitive communication deficit: Secondary | ICD-10-CM | POA: Diagnosis not present

## 2023-10-01 DIAGNOSIS — R2681 Unsteadiness on feet: Secondary | ICD-10-CM | POA: Diagnosis not present

## 2023-10-02 DIAGNOSIS — J209 Acute bronchitis, unspecified: Secondary | ICD-10-CM | POA: Diagnosis not present

## 2023-10-02 DIAGNOSIS — R41841 Cognitive communication deficit: Secondary | ICD-10-CM | POA: Diagnosis not present

## 2023-10-02 DIAGNOSIS — R2681 Unsteadiness on feet: Secondary | ICD-10-CM | POA: Diagnosis not present

## 2023-10-02 DIAGNOSIS — M810 Age-related osteoporosis without current pathological fracture: Secondary | ICD-10-CM | POA: Diagnosis not present

## 2023-10-02 DIAGNOSIS — E119 Type 2 diabetes mellitus without complications: Secondary | ICD-10-CM | POA: Diagnosis not present

## 2023-10-02 DIAGNOSIS — A403 Sepsis due to Streptococcus pneumoniae: Secondary | ICD-10-CM | POA: Diagnosis not present

## 2023-10-02 DIAGNOSIS — E118 Type 2 diabetes mellitus with unspecified complications: Secondary | ICD-10-CM | POA: Diagnosis not present

## 2023-10-02 DIAGNOSIS — M6281 Muscle weakness (generalized): Secondary | ICD-10-CM | POA: Diagnosis not present

## 2023-10-02 DIAGNOSIS — I10 Essential (primary) hypertension: Secondary | ICD-10-CM | POA: Diagnosis not present

## 2023-10-03 DIAGNOSIS — M6281 Muscle weakness (generalized): Secondary | ICD-10-CM | POA: Diagnosis not present

## 2023-10-03 DIAGNOSIS — J209 Acute bronchitis, unspecified: Secondary | ICD-10-CM | POA: Diagnosis not present

## 2023-10-03 DIAGNOSIS — R41841 Cognitive communication deficit: Secondary | ICD-10-CM | POA: Diagnosis not present

## 2023-10-03 DIAGNOSIS — A403 Sepsis due to Streptococcus pneumoniae: Secondary | ICD-10-CM | POA: Diagnosis not present

## 2023-10-03 DIAGNOSIS — R2681 Unsteadiness on feet: Secondary | ICD-10-CM | POA: Diagnosis not present

## 2023-10-03 DIAGNOSIS — E119 Type 2 diabetes mellitus without complications: Secondary | ICD-10-CM | POA: Diagnosis not present

## 2023-10-06 DIAGNOSIS — E119 Type 2 diabetes mellitus without complications: Secondary | ICD-10-CM | POA: Diagnosis not present

## 2023-10-06 DIAGNOSIS — J209 Acute bronchitis, unspecified: Secondary | ICD-10-CM | POA: Diagnosis not present

## 2023-10-06 DIAGNOSIS — R2681 Unsteadiness on feet: Secondary | ICD-10-CM | POA: Diagnosis not present

## 2023-10-06 DIAGNOSIS — R41841 Cognitive communication deficit: Secondary | ICD-10-CM | POA: Diagnosis not present

## 2023-10-06 DIAGNOSIS — M6281 Muscle weakness (generalized): Secondary | ICD-10-CM | POA: Diagnosis not present

## 2023-10-06 DIAGNOSIS — A403 Sepsis due to Streptococcus pneumoniae: Secondary | ICD-10-CM | POA: Diagnosis not present

## 2023-10-07 DIAGNOSIS — M6281 Muscle weakness (generalized): Secondary | ICD-10-CM | POA: Diagnosis not present

## 2023-10-07 DIAGNOSIS — J209 Acute bronchitis, unspecified: Secondary | ICD-10-CM | POA: Diagnosis not present

## 2023-10-07 DIAGNOSIS — R2681 Unsteadiness on feet: Secondary | ICD-10-CM | POA: Diagnosis not present

## 2023-10-07 DIAGNOSIS — A403 Sepsis due to Streptococcus pneumoniae: Secondary | ICD-10-CM | POA: Diagnosis not present

## 2023-10-07 DIAGNOSIS — R41841 Cognitive communication deficit: Secondary | ICD-10-CM | POA: Diagnosis not present

## 2023-10-07 DIAGNOSIS — E119 Type 2 diabetes mellitus without complications: Secondary | ICD-10-CM | POA: Diagnosis not present

## 2023-10-09 DIAGNOSIS — R0989 Other specified symptoms and signs involving the circulatory and respiratory systems: Secondary | ICD-10-CM | POA: Diagnosis not present

## 2023-10-09 DIAGNOSIS — R413 Other amnesia: Secondary | ICD-10-CM | POA: Diagnosis not present

## 2023-10-09 DIAGNOSIS — E1169 Type 2 diabetes mellitus with other specified complication: Secondary | ICD-10-CM | POA: Diagnosis not present

## 2023-10-09 DIAGNOSIS — I252 Old myocardial infarction: Secondary | ICD-10-CM | POA: Diagnosis not present

## 2023-10-09 DIAGNOSIS — I5022 Chronic systolic (congestive) heart failure: Secondary | ICD-10-CM | POA: Diagnosis not present

## 2023-10-09 DIAGNOSIS — Z0000 Encounter for general adult medical examination without abnormal findings: Secondary | ICD-10-CM | POA: Diagnosis not present

## 2023-10-10 DIAGNOSIS — M6281 Muscle weakness (generalized): Secondary | ICD-10-CM | POA: Diagnosis not present

## 2023-10-10 DIAGNOSIS — A403 Sepsis due to Streptococcus pneumoniae: Secondary | ICD-10-CM | POA: Diagnosis not present

## 2023-10-10 DIAGNOSIS — R2681 Unsteadiness on feet: Secondary | ICD-10-CM | POA: Diagnosis not present

## 2023-10-10 DIAGNOSIS — E119 Type 2 diabetes mellitus without complications: Secondary | ICD-10-CM | POA: Diagnosis not present

## 2023-10-10 DIAGNOSIS — R41841 Cognitive communication deficit: Secondary | ICD-10-CM | POA: Diagnosis not present

## 2023-10-10 DIAGNOSIS — J209 Acute bronchitis, unspecified: Secondary | ICD-10-CM | POA: Diagnosis not present

## 2023-10-14 DIAGNOSIS — E119 Type 2 diabetes mellitus without complications: Secondary | ICD-10-CM | POA: Diagnosis not present

## 2023-10-14 DIAGNOSIS — M6281 Muscle weakness (generalized): Secondary | ICD-10-CM | POA: Diagnosis not present

## 2023-10-14 DIAGNOSIS — A403 Sepsis due to Streptococcus pneumoniae: Secondary | ICD-10-CM | POA: Diagnosis not present

## 2023-10-14 DIAGNOSIS — R2681 Unsteadiness on feet: Secondary | ICD-10-CM | POA: Diagnosis not present

## 2023-10-14 DIAGNOSIS — R41841 Cognitive communication deficit: Secondary | ICD-10-CM | POA: Diagnosis not present

## 2023-10-14 DIAGNOSIS — J209 Acute bronchitis, unspecified: Secondary | ICD-10-CM | POA: Diagnosis not present

## 2023-10-15 DIAGNOSIS — R2681 Unsteadiness on feet: Secondary | ICD-10-CM | POA: Diagnosis not present

## 2023-10-15 DIAGNOSIS — A403 Sepsis due to Streptococcus pneumoniae: Secondary | ICD-10-CM | POA: Diagnosis not present

## 2023-10-15 DIAGNOSIS — E119 Type 2 diabetes mellitus without complications: Secondary | ICD-10-CM | POA: Diagnosis not present

## 2023-10-15 DIAGNOSIS — M6281 Muscle weakness (generalized): Secondary | ICD-10-CM | POA: Diagnosis not present

## 2023-10-15 DIAGNOSIS — R41841 Cognitive communication deficit: Secondary | ICD-10-CM | POA: Diagnosis not present

## 2023-10-15 DIAGNOSIS — J209 Acute bronchitis, unspecified: Secondary | ICD-10-CM | POA: Diagnosis not present

## 2023-10-17 DIAGNOSIS — A403 Sepsis due to Streptococcus pneumoniae: Secondary | ICD-10-CM | POA: Diagnosis not present

## 2023-10-17 DIAGNOSIS — E119 Type 2 diabetes mellitus without complications: Secondary | ICD-10-CM | POA: Diagnosis not present

## 2023-10-17 DIAGNOSIS — R2681 Unsteadiness on feet: Secondary | ICD-10-CM | POA: Diagnosis not present

## 2023-10-17 DIAGNOSIS — M6281 Muscle weakness (generalized): Secondary | ICD-10-CM | POA: Diagnosis not present

## 2023-10-17 DIAGNOSIS — J209 Acute bronchitis, unspecified: Secondary | ICD-10-CM | POA: Diagnosis not present

## 2023-10-17 DIAGNOSIS — R41841 Cognitive communication deficit: Secondary | ICD-10-CM | POA: Diagnosis not present

## 2023-10-21 DIAGNOSIS — E119 Type 2 diabetes mellitus without complications: Secondary | ICD-10-CM | POA: Diagnosis not present

## 2023-10-21 DIAGNOSIS — J209 Acute bronchitis, unspecified: Secondary | ICD-10-CM | POA: Diagnosis not present

## 2023-10-21 DIAGNOSIS — R41841 Cognitive communication deficit: Secondary | ICD-10-CM | POA: Diagnosis not present

## 2023-10-21 DIAGNOSIS — R2681 Unsteadiness on feet: Secondary | ICD-10-CM | POA: Diagnosis not present

## 2023-10-21 DIAGNOSIS — M6281 Muscle weakness (generalized): Secondary | ICD-10-CM | POA: Diagnosis not present

## 2023-10-21 DIAGNOSIS — A403 Sepsis due to Streptococcus pneumoniae: Secondary | ICD-10-CM | POA: Diagnosis not present

## 2023-10-22 DIAGNOSIS — M6281 Muscle weakness (generalized): Secondary | ICD-10-CM | POA: Diagnosis not present

## 2023-10-22 DIAGNOSIS — E119 Type 2 diabetes mellitus without complications: Secondary | ICD-10-CM | POA: Diagnosis not present

## 2023-10-22 DIAGNOSIS — R2681 Unsteadiness on feet: Secondary | ICD-10-CM | POA: Diagnosis not present

## 2023-10-22 DIAGNOSIS — R41841 Cognitive communication deficit: Secondary | ICD-10-CM | POA: Diagnosis not present

## 2023-10-22 DIAGNOSIS — A403 Sepsis due to Streptococcus pneumoniae: Secondary | ICD-10-CM | POA: Diagnosis not present

## 2023-10-22 DIAGNOSIS — J209 Acute bronchitis, unspecified: Secondary | ICD-10-CM | POA: Diagnosis not present

## 2023-10-28 DIAGNOSIS — R2681 Unsteadiness on feet: Secondary | ICD-10-CM | POA: Diagnosis not present

## 2023-10-28 DIAGNOSIS — M6281 Muscle weakness (generalized): Secondary | ICD-10-CM | POA: Diagnosis not present

## 2023-10-28 DIAGNOSIS — A403 Sepsis due to Streptococcus pneumoniae: Secondary | ICD-10-CM | POA: Diagnosis not present

## 2023-10-28 DIAGNOSIS — E119 Type 2 diabetes mellitus without complications: Secondary | ICD-10-CM | POA: Diagnosis not present

## 2023-10-29 DIAGNOSIS — R2681 Unsteadiness on feet: Secondary | ICD-10-CM | POA: Diagnosis not present

## 2023-10-29 DIAGNOSIS — M6281 Muscle weakness (generalized): Secondary | ICD-10-CM | POA: Diagnosis not present

## 2023-10-29 DIAGNOSIS — E119 Type 2 diabetes mellitus without complications: Secondary | ICD-10-CM | POA: Diagnosis not present

## 2023-10-29 DIAGNOSIS — A403 Sepsis due to Streptococcus pneumoniae: Secondary | ICD-10-CM | POA: Diagnosis not present

## 2023-10-30 DIAGNOSIS — M6281 Muscle weakness (generalized): Secondary | ICD-10-CM | POA: Diagnosis not present

## 2023-10-30 DIAGNOSIS — A403 Sepsis due to Streptococcus pneumoniae: Secondary | ICD-10-CM | POA: Diagnosis not present

## 2023-10-30 DIAGNOSIS — E119 Type 2 diabetes mellitus without complications: Secondary | ICD-10-CM | POA: Diagnosis not present

## 2023-10-30 DIAGNOSIS — R2681 Unsteadiness on feet: Secondary | ICD-10-CM | POA: Diagnosis not present

## 2023-11-04 DIAGNOSIS — R2681 Unsteadiness on feet: Secondary | ICD-10-CM | POA: Diagnosis not present

## 2023-11-04 DIAGNOSIS — M6281 Muscle weakness (generalized): Secondary | ICD-10-CM | POA: Diagnosis not present

## 2023-11-04 DIAGNOSIS — E119 Type 2 diabetes mellitus without complications: Secondary | ICD-10-CM | POA: Diagnosis not present

## 2023-11-04 DIAGNOSIS — A403 Sepsis due to Streptococcus pneumoniae: Secondary | ICD-10-CM | POA: Diagnosis not present

## 2023-11-05 DIAGNOSIS — E119 Type 2 diabetes mellitus without complications: Secondary | ICD-10-CM | POA: Diagnosis not present

## 2023-11-05 DIAGNOSIS — A403 Sepsis due to Streptococcus pneumoniae: Secondary | ICD-10-CM | POA: Diagnosis not present

## 2023-11-05 DIAGNOSIS — M6281 Muscle weakness (generalized): Secondary | ICD-10-CM | POA: Diagnosis not present

## 2023-11-05 DIAGNOSIS — R2681 Unsteadiness on feet: Secondary | ICD-10-CM | POA: Diagnosis not present

## 2023-11-07 DIAGNOSIS — A403 Sepsis due to Streptococcus pneumoniae: Secondary | ICD-10-CM | POA: Diagnosis not present

## 2023-11-07 DIAGNOSIS — M6281 Muscle weakness (generalized): Secondary | ICD-10-CM | POA: Diagnosis not present

## 2023-11-07 DIAGNOSIS — E119 Type 2 diabetes mellitus without complications: Secondary | ICD-10-CM | POA: Diagnosis not present

## 2023-11-07 DIAGNOSIS — R2681 Unsteadiness on feet: Secondary | ICD-10-CM | POA: Diagnosis not present

## 2023-11-11 DIAGNOSIS — E119 Type 2 diabetes mellitus without complications: Secondary | ICD-10-CM | POA: Diagnosis not present

## 2023-11-11 DIAGNOSIS — A403 Sepsis due to Streptococcus pneumoniae: Secondary | ICD-10-CM | POA: Diagnosis not present

## 2023-11-11 DIAGNOSIS — M6281 Muscle weakness (generalized): Secondary | ICD-10-CM | POA: Diagnosis not present

## 2023-11-11 DIAGNOSIS — R2681 Unsteadiness on feet: Secondary | ICD-10-CM | POA: Diagnosis not present

## 2023-11-12 DIAGNOSIS — A403 Sepsis due to Streptococcus pneumoniae: Secondary | ICD-10-CM | POA: Diagnosis not present

## 2023-11-12 DIAGNOSIS — R2681 Unsteadiness on feet: Secondary | ICD-10-CM | POA: Diagnosis not present

## 2023-11-12 DIAGNOSIS — E119 Type 2 diabetes mellitus without complications: Secondary | ICD-10-CM | POA: Diagnosis not present

## 2023-11-12 DIAGNOSIS — M6281 Muscle weakness (generalized): Secondary | ICD-10-CM | POA: Diagnosis not present

## 2023-11-14 DIAGNOSIS — E119 Type 2 diabetes mellitus without complications: Secondary | ICD-10-CM | POA: Diagnosis not present

## 2023-11-14 DIAGNOSIS — R2681 Unsteadiness on feet: Secondary | ICD-10-CM | POA: Diagnosis not present

## 2023-11-14 DIAGNOSIS — M6281 Muscle weakness (generalized): Secondary | ICD-10-CM | POA: Diagnosis not present

## 2023-11-14 DIAGNOSIS — A403 Sepsis due to Streptococcus pneumoniae: Secondary | ICD-10-CM | POA: Diagnosis not present

## 2023-12-08 ENCOUNTER — Ambulatory Visit: Admitting: Podiatry

## 2024-02-02 DIAGNOSIS — N401 Enlarged prostate with lower urinary tract symptoms: Secondary | ICD-10-CM | POA: Diagnosis not present

## 2024-02-02 DIAGNOSIS — R351 Nocturia: Secondary | ICD-10-CM | POA: Diagnosis not present

## 2024-02-10 DIAGNOSIS — D509 Iron deficiency anemia, unspecified: Secondary | ICD-10-CM | POA: Diagnosis not present

## 2024-02-10 DIAGNOSIS — I5032 Chronic diastolic (congestive) heart failure: Secondary | ICD-10-CM | POA: Diagnosis not present

## 2024-02-11 DIAGNOSIS — E785 Hyperlipidemia, unspecified: Secondary | ICD-10-CM | POA: Diagnosis not present

## 2024-02-11 DIAGNOSIS — E119 Type 2 diabetes mellitus without complications: Secondary | ICD-10-CM | POA: Diagnosis not present

## 2024-02-11 DIAGNOSIS — I509 Heart failure, unspecified: Secondary | ICD-10-CM | POA: Diagnosis not present

## 2024-02-18 ENCOUNTER — Encounter (HOSPITAL_COMMUNITY): Payer: Self-pay

## 2024-02-18 ENCOUNTER — Emergency Department (HOSPITAL_COMMUNITY)
Admission: EM | Admit: 2024-02-18 | Discharge: 2024-02-18 | Disposition: A | Discharge status: Home / Self Care | Source: Skilled Nursing Facility | Attending: Emergency Medicine | Admitting: Emergency Medicine

## 2024-02-18 ENCOUNTER — Emergency Department (HOSPITAL_COMMUNITY)

## 2024-02-18 DIAGNOSIS — R9089 Other abnormal findings on diagnostic imaging of central nervous system: Secondary | ICD-10-CM | POA: Diagnosis not present

## 2024-02-18 DIAGNOSIS — S12120S Other displaced dens fracture, sequela: Secondary | ICD-10-CM | POA: Diagnosis not present

## 2024-02-18 DIAGNOSIS — M47816 Spondylosis without myelopathy or radiculopathy, lumbar region: Secondary | ICD-10-CM | POA: Diagnosis not present

## 2024-02-18 DIAGNOSIS — Z96643 Presence of artificial hip joint, bilateral: Secondary | ICD-10-CM | POA: Diagnosis not present

## 2024-02-18 DIAGNOSIS — I2510 Atherosclerotic heart disease of native coronary artery without angina pectoris: Secondary | ICD-10-CM | POA: Insufficient documentation

## 2024-02-18 DIAGNOSIS — W1830XA Fall on same level, unspecified, initial encounter: Secondary | ICD-10-CM

## 2024-02-18 DIAGNOSIS — S0990XA Unspecified injury of head, initial encounter: Secondary | ICD-10-CM | POA: Insufficient documentation

## 2024-02-18 DIAGNOSIS — R7989 Other specified abnormal findings of blood chemistry: Secondary | ICD-10-CM | POA: Diagnosis not present

## 2024-02-18 DIAGNOSIS — I428 Other cardiomyopathies: Secondary | ICD-10-CM | POA: Diagnosis not present

## 2024-02-18 DIAGNOSIS — G9341 Metabolic encephalopathy: Secondary | ICD-10-CM | POA: Diagnosis present

## 2024-02-18 DIAGNOSIS — W19XXXA Unspecified fall, initial encounter: Secondary | ICD-10-CM | POA: Diagnosis not present

## 2024-02-18 DIAGNOSIS — I21A1 Myocardial infarction type 2: Secondary | ICD-10-CM | POA: Diagnosis present

## 2024-02-18 DIAGNOSIS — I509 Heart failure, unspecified: Secondary | ICD-10-CM | POA: Insufficient documentation

## 2024-02-18 DIAGNOSIS — S12112A Nondisplaced Type II dens fracture, initial encounter for closed fracture: Secondary | ICD-10-CM | POA: Diagnosis present

## 2024-02-18 DIAGNOSIS — S12400A Unspecified displaced fracture of fifth cervical vertebra, initial encounter for closed fracture: Secondary | ICD-10-CM | POA: Diagnosis not present

## 2024-02-18 DIAGNOSIS — I4891 Unspecified atrial fibrillation: Secondary | ICD-10-CM | POA: Diagnosis not present

## 2024-02-18 DIAGNOSIS — I959 Hypotension, unspecified: Secondary | ICD-10-CM | POA: Diagnosis not present

## 2024-02-18 DIAGNOSIS — S0031XA Abrasion of nose, initial encounter: Secondary | ICD-10-CM | POA: Insufficient documentation

## 2024-02-18 DIAGNOSIS — Z23 Encounter for immunization: Secondary | ICD-10-CM | POA: Diagnosis not present

## 2024-02-18 DIAGNOSIS — R0989 Other specified symptoms and signs involving the circulatory and respiratory systems: Secondary | ICD-10-CM | POA: Diagnosis not present

## 2024-02-18 DIAGNOSIS — E1110 Type 2 diabetes mellitus with ketoacidosis without coma: Secondary | ICD-10-CM | POA: Diagnosis present

## 2024-02-18 DIAGNOSIS — Z9181 History of falling: Secondary | ICD-10-CM | POA: Diagnosis not present

## 2024-02-18 DIAGNOSIS — S12500A Unspecified displaced fracture of sixth cervical vertebra, initial encounter for closed fracture: Secondary | ICD-10-CM | POA: Diagnosis not present

## 2024-02-18 DIAGNOSIS — Y95 Nosocomial condition: Secondary | ICD-10-CM | POA: Diagnosis present

## 2024-02-18 DIAGNOSIS — I480 Paroxysmal atrial fibrillation: Secondary | ICD-10-CM | POA: Diagnosis present

## 2024-02-18 DIAGNOSIS — S12100A Unspecified displaced fracture of second cervical vertebra, initial encounter for closed fracture: Secondary | ICD-10-CM | POA: Diagnosis not present

## 2024-02-18 DIAGNOSIS — E66811 Obesity, class 1: Secondary | ICD-10-CM | POA: Diagnosis present

## 2024-02-18 DIAGNOSIS — E785 Hyperlipidemia, unspecified: Secondary | ICD-10-CM | POA: Diagnosis present

## 2024-02-18 DIAGNOSIS — Y92129 Unspecified place in nursing home as the place of occurrence of the external cause: Secondary | ICD-10-CM | POA: Insufficient documentation

## 2024-02-18 DIAGNOSIS — Z043 Encounter for examination and observation following other accident: Secondary | ICD-10-CM | POA: Diagnosis not present

## 2024-02-18 DIAGNOSIS — S0081XA Abrasion of other part of head, initial encounter: Secondary | ICD-10-CM | POA: Diagnosis not present

## 2024-02-18 DIAGNOSIS — Y92009 Unspecified place in unspecified non-institutional (private) residence as the place of occurrence of the external cause: Secondary | ICD-10-CM | POA: Diagnosis not present

## 2024-02-18 DIAGNOSIS — I214 Non-ST elevation (NSTEMI) myocardial infarction: Secondary | ICD-10-CM | POA: Diagnosis not present

## 2024-02-18 DIAGNOSIS — Z7982 Long term (current) use of aspirin: Secondary | ICD-10-CM | POA: Insufficient documentation

## 2024-02-18 DIAGNOSIS — F0390 Unspecified dementia without behavioral disturbance: Secondary | ICD-10-CM | POA: Diagnosis present

## 2024-02-18 DIAGNOSIS — F05 Delirium due to known physiological condition: Secondary | ICD-10-CM | POA: Diagnosis not present

## 2024-02-18 DIAGNOSIS — A419 Sepsis, unspecified organism: Secondary | ICD-10-CM | POA: Diagnosis present

## 2024-02-18 DIAGNOSIS — N179 Acute kidney failure, unspecified: Secondary | ICD-10-CM | POA: Diagnosis present

## 2024-02-18 DIAGNOSIS — E0810 Diabetes mellitus due to underlying condition with ketoacidosis without coma: Secondary | ICD-10-CM | POA: Diagnosis not present

## 2024-02-18 DIAGNOSIS — I452 Bifascicular block: Secondary | ICD-10-CM | POA: Diagnosis present

## 2024-02-18 DIAGNOSIS — S0081XD Abrasion of other part of head, subsequent encounter: Secondary | ICD-10-CM | POA: Diagnosis not present

## 2024-02-18 DIAGNOSIS — I700 Atherosclerosis of aorta: Secondary | ICD-10-CM | POA: Diagnosis not present

## 2024-02-18 DIAGNOSIS — I110 Hypertensive heart disease with heart failure: Secondary | ICD-10-CM | POA: Diagnosis present

## 2024-02-18 DIAGNOSIS — S0003XA Contusion of scalp, initial encounter: Secondary | ICD-10-CM | POA: Diagnosis not present

## 2024-02-18 DIAGNOSIS — K5730 Diverticulosis of large intestine without perforation or abscess without bleeding: Secondary | ICD-10-CM | POA: Diagnosis not present

## 2024-02-18 DIAGNOSIS — H401110 Primary open-angle glaucoma, right eye, stage unspecified: Secondary | ICD-10-CM | POA: Diagnosis not present

## 2024-02-18 DIAGNOSIS — R42 Dizziness and giddiness: Secondary | ICD-10-CM | POA: Diagnosis not present

## 2024-02-18 DIAGNOSIS — I10 Essential (primary) hypertension: Secondary | ICD-10-CM | POA: Diagnosis not present

## 2024-02-18 DIAGNOSIS — R6520 Severe sepsis without septic shock: Secondary | ICD-10-CM | POA: Diagnosis present

## 2024-02-18 DIAGNOSIS — M549 Dorsalgia, unspecified: Secondary | ICD-10-CM | POA: Diagnosis not present

## 2024-02-18 DIAGNOSIS — Z1152 Encounter for screening for COVID-19: Secondary | ICD-10-CM | POA: Diagnosis not present

## 2024-02-18 DIAGNOSIS — K449 Diaphragmatic hernia without obstruction or gangrene: Secondary | ICD-10-CM | POA: Diagnosis not present

## 2024-02-18 DIAGNOSIS — R1310 Dysphagia, unspecified: Secondary | ICD-10-CM | POA: Diagnosis present

## 2024-02-18 DIAGNOSIS — Z7984 Long term (current) use of oral hypoglycemic drugs: Secondary | ICD-10-CM | POA: Insufficient documentation

## 2024-02-18 DIAGNOSIS — R000 Tachycardia, unspecified: Secondary | ICD-10-CM | POA: Diagnosis not present

## 2024-02-18 DIAGNOSIS — S12112D Nondisplaced Type II dens fracture, subsequent encounter for fracture with routine healing: Secondary | ICD-10-CM | POA: Diagnosis not present

## 2024-02-18 DIAGNOSIS — J189 Pneumonia, unspecified organism: Secondary | ICD-10-CM | POA: Diagnosis not present

## 2024-02-18 DIAGNOSIS — N400 Enlarged prostate without lower urinary tract symptoms: Secondary | ICD-10-CM | POA: Diagnosis present

## 2024-02-18 DIAGNOSIS — E119 Type 2 diabetes mellitus without complications: Secondary | ICD-10-CM | POA: Insufficient documentation

## 2024-02-18 DIAGNOSIS — Z7401 Bed confinement status: Secondary | ICD-10-CM | POA: Diagnosis not present

## 2024-02-18 DIAGNOSIS — F39 Unspecified mood [affective] disorder: Secondary | ICD-10-CM | POA: Diagnosis present

## 2024-02-18 DIAGNOSIS — I5032 Chronic diastolic (congestive) heart failure: Secondary | ICD-10-CM | POA: Diagnosis not present

## 2024-02-18 DIAGNOSIS — I5A Non-ischemic myocardial injury (non-traumatic): Secondary | ICD-10-CM | POA: Diagnosis not present

## 2024-02-18 DIAGNOSIS — E872 Acidosis, unspecified: Secondary | ICD-10-CM | POA: Diagnosis not present

## 2024-02-18 DIAGNOSIS — E860 Dehydration: Secondary | ICD-10-CM | POA: Diagnosis not present

## 2024-02-18 DIAGNOSIS — E118 Type 2 diabetes mellitus with unspecified complications: Secondary | ICD-10-CM | POA: Diagnosis not present

## 2024-02-18 DIAGNOSIS — S12101A Unspecified nondisplaced fracture of second cervical vertebra, initial encounter for closed fracture: Secondary | ICD-10-CM | POA: Diagnosis not present

## 2024-02-18 DIAGNOSIS — R531 Weakness: Secondary | ICD-10-CM | POA: Diagnosis not present

## 2024-02-18 LAB — COMPREHENSIVE METABOLIC PANEL WITH GFR
ALT: 12 U/L (ref 0–44)
AST: 13 U/L — ABNORMAL LOW (ref 15–41)
Albumin: 4 g/dL (ref 3.5–5.0)
Alkaline Phosphatase: 113 U/L (ref 38–126)
Anion gap: 17 — ABNORMAL HIGH (ref 5–15)
BUN: 28 mg/dL — ABNORMAL HIGH (ref 8–23)
CO2: 19 mmol/L — ABNORMAL LOW (ref 22–32)
Calcium: 9.1 mg/dL (ref 8.9–10.3)
Chloride: 105 mmol/L (ref 98–111)
Creatinine, Ser: 1.02 mg/dL (ref 0.61–1.24)
GFR, Estimated: >60 mL/min (ref >60)
Glucose, Bld: 176 mg/dL — ABNORMAL HIGH (ref 70–99)
Potassium: 4.4 mmol/L (ref 3.5–5.1)
Sodium: 141 mmol/L (ref 135–145)
Total Bilirubin: 0.7 mg/dL (ref 0.0–1.2)
Total Protein: 6.6 g/dL (ref 6.5–8.1)

## 2024-02-18 LAB — CBC WITH DIFFERENTIAL/PLATELET
Abs Immature Granulocytes: 0.03 K/uL (ref 0.00–0.07)
Basophils Absolute: 0 K/uL (ref 0.0–0.1)
Basophils Relative: 1 %
Eosinophils Absolute: 0.5 K/uL (ref 0.0–0.5)
Eosinophils Relative: 6 %
HCT: 43 % (ref 39.0–52.0)
Hemoglobin: 14.1 g/dL (ref 13.0–17.0)
Immature Granulocytes: 0 %
Lymphocytes Relative: 14 %
Lymphs Abs: 1.1 K/uL (ref 0.7–4.0)
MCH: 29 pg (ref 26.0–34.0)
MCHC: 32.8 g/dL (ref 30.0–36.0)
MCV: 88.3 fL (ref 80.0–100.0)
Monocytes Absolute: 0.7 K/uL (ref 0.1–1.0)
Monocytes Relative: 9 %
Neutro Abs: 5.5 K/uL (ref 1.7–7.7)
Neutrophils Relative %: 70 %
Platelets: 163 K/uL (ref 150–400)
RBC: 4.87 MIL/uL (ref 4.22–5.81)
RDW: 13.2 % (ref 11.5–15.5)
WBC: 7.9 K/uL (ref 4.0–10.5)
nRBC: 0 % (ref 0.0–0.2)

## 2024-02-18 MED ORDER — CARVEDILOL 3.125 MG PO TABS: 6.2500 mg | ORAL_TABLET | Freq: Two times a day (BID) | ORAL | Status: DC

## 2024-02-18 MED ORDER — BACITRACIN ZINC 500 UNIT/GM EX OINT
TOPICAL_OINTMENT | Freq: Once | CUTANEOUS | Status: AC
  Filled 2024-02-18: qty 0.9

## 2024-02-18 MED ORDER — FINASTERIDE 5 MG PO TABS: 5.0000 mg | ORAL_TABLET | Freq: Every day | ORAL | Status: DC

## 2024-02-18 MED ORDER — SERTRALINE HCL 50 MG PO TABS: 50.0000 mg | ORAL_TABLET | Freq: Every day | ORAL | Status: DC

## 2024-02-18 MED ORDER — ACETAMINOPHEN 325 MG PO TABS: 650.0000 mg | ORAL_TABLET | Freq: Two times a day (BID) | ORAL | Status: DC | PRN

## 2024-02-18 MED ORDER — BACITRACIN ZINC 500 UNIT/GM EX OINT: 1.0000 | TOPICAL_OINTMENT | Freq: Two times a day (BID) | CUTANEOUS | 0 refills | Status: DC

## 2024-02-18 MED ORDER — POTASSIUM CHLORIDE 20 MEQ PO PACK: 20.0000 meq | PACK | Freq: Every day | ORAL | Status: DC

## 2024-02-18 MED ORDER — TAMSULOSIN HCL 0.4 MG PO CAPS: 0.4000 mg | ORAL_CAPSULE | Freq: Every day | ORAL | Status: DC

## 2024-02-18 MED ORDER — ACETAMINOPHEN 325 MG PO TABS
650.0000 mg | ORAL_TABLET | Freq: Once | ORAL | Status: AC
  Administered 2024-02-18: 650 mg via ORAL
  Filled 2024-02-18: qty 2

## 2024-02-18 MED ORDER — TETANUS-DIPHTH-ACELL PERTUSSIS 5-2.5-18.5 LF-MCG/0.5 IM SUSY
0.5000 mL | PREFILLED_SYRINGE | Freq: Once | INTRAMUSCULAR | Status: AC
  Administered 2024-02-18: 0.5 mL via INTRAMUSCULAR
  Filled 2024-02-18: qty 0.5

## 2024-02-18 MED ORDER — ASPIRIN 81 MG PO CHEW: 81.0000 mg | CHEWABLE_TABLET | Freq: Once | ORAL | Status: DC

## 2024-02-18 MED ORDER — ATORVASTATIN CALCIUM 40 MG PO TABS: 40.0000 mg | ORAL_TABLET | Freq: Every evening | ORAL | Status: DC

## 2024-02-18 MED ORDER — SACUBITRIL-VALSARTAN 24-26 MG PO TABS: 1.0000 | ORAL_TABLET | Freq: Two times a day (BID) | ORAL | Status: DC

## 2024-02-18 MED ORDER — BRIMONIDINE TARTRATE 0.15 % OP SOLN
1.0000 [drp] | Freq: Every day | OPHTHALMIC | Status: DC
  Filled 2024-02-18: qty 5

## 2024-02-18 NOTE — ED Triage Notes (Signed)
 Pt BIB EMS from [facility] due to an unwitnessed fall. Abrasion and brusing to forehead and chin. Pt c/o neck discomfort; no pain. Hx of DM2 CBG 210. No blood thinners. AAOx4. Unknown LOC. Pt uses walker.  BP 148/72 20 ga LAC SpO2 97%

## 2024-02-18 NOTE — ED Provider Notes (Signed)
 [place] EMERGENCY DEPARTMENT AT [facility] Provider Note   CSN: [id number] Arrival date & time: 02/18/24  9053     History Chief Complaint  Patient presents with   Fall     Fall   HPI: Matthew Calderon is a 89 y.o. male with history perinent for diabetes, CAD, CHF, osteoporosis, disequilibrium with multiple prior falls who presents complaining of falling. Patient arrived via EMS from [facility].  History provided by patient.  No interpreter required during this encounter.  Patient reports that he was at his living facility when he sustained a fall.  Does not remember the specifics of the fall, but however reports that he was told that he was not using his walker which he usually requires to ambulate.  Reports that he recalls hitting his face on the ground.  Does not believe he has any loss of consciousness.  Reports that he has some neck discomfort, however denies any back pain, chest pain, upper extremity pain, bilateral lower extremity pain.  Denies use of anticoagulants.  Prior to Admission medications   Medication Sig Start Date End Date Taking? Authorizing Provider  acetaminophen  (TYLENOL ) 500 MG tablet Take 650 mg by mouth 2 (two) times daily as needed for mild pain (pain score 1-3) or moderate pain (pain score 4-6). 11/17/13   [provider]  aspirin  81 MG chewable tablet Chew 81 mg by mouth daily. 01/14/20   [provider]  atorvastatin  (LIPITOR) 40 MG tablet Take 1 tablet (40 mg total) by mouth daily at 6 PM. Patient taking differently: Take 40 mg by mouth every evening. 06/26/18   Elgergawy, Brayton RAMAN, MD  brimonidine  (ALPHAGAN ) 0.2 % ophthalmic solution Place 1 drop into both eyes 2 (two) times daily. 09/22/17   [provider]  carvedilol  (COREG ) 6.25 MG tablet Take 6.25 mg by mouth 2 (two) times daily with a meal.    [provider]  cetirizine (ZYRTEC) 10 MG tablet Take 10 mg by mouth daily.    [provider]   CHOLECALCIFEROL  PO Take 1,000 mcg by mouth every morning.    [provider]  Empagliflozin-Linaglip-Metform (TRIJARDY XR) 25-10-998 MG TB24 Take 1 tablet by mouth daily.    [provider]  Ferrous Sulfate  Dried 143 (45 Fe) MG TBCR Take 45 mg by mouth daily.    [provider]  finasteride  (PROSCAR ) 5 MG tablet Take 5 mg by mouth every evening.  03/11/19   [provider]  furosemide  (LASIX ) 20 MG tablet Take 20 mg by mouth daily as needed for fluid or edema.    [provider]  guaiFENesin  (MUCINEX ) 600 MG 12 hr tablet Take 1 tablet (600 mg total) by mouth 2 (two) times daily. 08/15/23   Christobal Guadalajara, MD  mirabegron  ER (MYRBETRIQ ) 50 MG TB24 tablet Take 50 mg by mouth daily.    [provider]  Multiple Vitamins-Minerals (PRESERVISION AREDS 2) CAPS Take 1 capsule by mouth 2 (two) times daily.     [provider]  potassium chloride  (KLOR-CON ) 10 MEQ tablet Take 10 mEq by mouth daily.    [provider]  sacubitril -valsartan  (ENTRESTO ) 24-26 MG Take 1 tablet by mouth 2 (two) times daily.    [provider]  sertraline  (ZOLOFT ) 50 MG tablet Take 50 mg by mouth every evening.    [provider]  SYNJARDY XR 25-1000 MG TB24 Take 1 tablet by mouth daily.  04/22/19   [provider]  tamsulosin  (FLOMAX ) 0.4  MG CAPS capsule Take 0.4 mg by mouth at bedtime.    [provider]  timolol  (TIMOPTIC ) 0.5 % ophthalmic solution Place 1 drop into both eyes 2 (two) times daily. 09/22/17   [provider]     Allergies: Alendronate sodium, Penicillin g, Penicillins, Sulfa antibiotics, and Sulfacetamide sodium   Review of Systems   ROS as per HPI  Physical Exam Updated Vital Signs BP (!) 149/77   Pulse 89   Temp 98.1 F (36.7 C) (Oral)   Resp 16   SpO2 96%  Physical Exam Vitals and nursing note reviewed.  Constitutional:      General: He is not in acute distress.    Appearance: He is  well-developed.  HENT:     Head: Normocephalic.     Comments: Abrasions to forehead, nose, chin, see image Eyes:     Conjunctiva/sclera: Conjunctivae normal.  Neck:     Comments: Cervical collar in place Cardiovascular:     Rate and Rhythm: Normal rate and regular rhythm.     Heart sounds: No murmur heard. Pulmonary:     Effort: Pulmonary effort is normal. No respiratory distress.     Breath sounds: Normal breath sounds.  Abdominal:     Palpations: Abdomen is soft.     Tenderness: There is no abdominal tenderness.  Musculoskeletal:        General: No swelling.     Comments: Chest wall stable, nontender to AP and lateral compression.  Pelvis stable and nontender to AP and lateral compression.  All 4 extremities appear atraumatic, no focal tenderness to palpation  Skin:    General: Skin is warm and dry.     Capillary Refill: Capillary refill takes less than 2 seconds.  Neurological:     Mental Status: He is alert.  Psychiatric:        Mood and Affect: Mood normal.     ED Course/ Medical Decision Making/ A&P    Procedures Procedures   Medications Ordered in ED Medications  acetaminophen  (TYLENOL ) tablet 650 mg (has no administration in time range)  aspirin  chewable tablet 81 mg (has no administration in time range)  atorvastatin  (LIPITOR) tablet 40 mg (has no administration in time range)  brimonidine  (ALPHAGAN ) 0.15 % ophthalmic solution 1 drop (has no administration in time range)  carvedilol  (COREG ) tablet 6.25 mg (has no administration in time range)  finasteride  (PROSCAR ) tablet 5 mg (has no administration in time range)  potassium chloride  (KLOR-CON ) packet 20 mEq (has no administration in time range)  sacubitril -valsartan  (ENTRESTO ) 24-26 mg per tablet (has no administration in time range)  sertraline  (ZOLOFT ) tablet 50 mg (has no administration in time range)  tamsulosin  (FLOMAX ) capsule 0.4 mg (has no administration in time range)  Tdap (BOOSTRIX ) injection 0.5  mL (0.5 mLs Intramuscular Given 02/18/24 1203)  acetaminophen  (TYLENOL ) tablet 650 mg (650 mg Oral Given 02/18/24 1204)  bacitracin  ointment ( Topical Given 02/18/24 1205)    Medical Decision Making:   ADE STMARIE is a 89 y.o. male who presents for unwitnessed fall as per above.  Physical exam is pertinent for neck pain in c-collar, abrasions to face.   The differential includes but is not limited to laceration, abrasion ICH, TBI, skull fracture, spinal fracture/dislocation, blunt thoracic trauma, hemothorax, pneumothorax, rib fractures, blunt abdominal trauma, hemorrhage, extremity fracture, dislocation.  Independent historian: None  External data reviewed: Notes: Reviewed prior transpired medical history, most recent Tdap in 2019  Labs: Ordered and Independent interpretation CBC: Without leukocytosis,  anemia, thrombocytopenia CMP: No AKI, maternal type derangement, emergent LFT abnormality, mildly low bicarb is at baseline the patient  Radiology: Ordered, Independent interpretation, and All images reviewed independently. Agree with radiology report at this time.   CXR: No acute cardiac or pulmonary abnormality. No appreciable rib fx. No PTX.  Pelvis XR: Pelvic ring intact. No acute fx. Both hips located.  CT head: No ICH or displaced skull fracture CT C-spine: No displaced fracture or dislocation CT HEAD WO CONTRAST Result Date: 02/18/2024 EXAM: CT HEAD AND CERVICAL SPINE 02/18/2024 10:42:31 AM TECHNIQUE: CT of the head and cervical spine was performed without the administration of intravenous contrast. Multiplanar reformatted images are provided for review. Automated exposure control, iterative reconstruction, and/or weight based adjustment of the mA/kV was utilized to reduce the radiation dose to as low as reasonably achievable. COMPARISON: CT head and cervical spine 04/09/2021. CLINICAL HISTORY: Polytrauma, blunt. unwitnessed fall. Abrasion and brusing to forehead and chin. Pt c/o neck  discomfort; no pain. Hx of DM2 CBG 210. No blood thinners. AAOx4. Unknown LOC. Pt uses walker. FINDINGS: CT HEAD BRAIN AND VENTRICLES: No acute intracranial hemorrhage. No mass effect or midline shift. No abnormal extra-axial fluid collection. Gray-white differentiation is maintained. No hydrocephalus. Cerebral white matter hypodensities, similar to the prior CT and nonspecific but compatible with mild chronic small vessel ischemic disease. 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 Moderate global cerebral atrophy as well as asymmetrically severe volume loss in the right mesial temporal lobe. Calcified atherosclerosis at the skull base. ORBITS: Bilateral cataract extraction. SINUSES AND MASTOIDS: Minimal mucosal thickening in right ethmoid air cells. Clear mastoid air cells. SOFT TISSUES AND SKULL: Small right-sided forehead scalp hematoma. No acute skull fracture. CT CERVICAL SPINE BONES AND ALIGNMENT: Unchanged grade 1 anterolisthesis of C5 on C6 and C6 on C7. Nondisplaced type 2 dens fracture, new from 2022 but of indeterminate age and potentially nonacute as some of the margins may be partially corticated. Intact posterior elements. DEGENERATIVE CHANGES: Advanced diffuse facet arthrosis. Focally advanced disc degeneration at C7-T1 with mild degeneration elsewhere. SOFT TISSUES: No significant prevertebral soft tissue swelling or fluid. IMPRESSION: 1. No acute intracranial abnormality. 2. Nondisplaced type 2 dens fracture, new from 2022 but of indeterminate age. No significant prevertebral soft tissue swelling or fluid. Electronically signed by: Dasie Hamburg MD 02/18/2024 11:11 AM EDT RP Workstation: [id number]   CT CERVICAL SPINE WO CONTRAST Result Date: 02/18/2024 EXAM: CT HEAD AND CERVICAL SPINE 02/18/2024 10:42:31 AM TECHNIQUE: CT of the head and cervical spine was performed without the administration of intravenous contrast. Multiplanar reformatted images are provided for review. Automated  exposure control, iterative reconstruction, and/or weight based adjustment of the mA/kV was utilized to reduce the radiation dose to as low as reasonably achievable. COMPARISON: CT head and cervical spine 04/09/2021. CLINICAL HISTORY: Polytrauma, blunt. unwitnessed fall. Abrasion and brusing to forehead and chin. Pt c/o neck discomfort; no pain. Hx of DM2 CBG 210. No blood thinners. AAOx4. Unknown LOC. Pt uses walker. FINDINGS: CT HEAD BRAIN AND VENTRICLES: No acute intracranial hemorrhage. No mass effect or midline shift. No abnormal extra-axial fluid collection. Gray-white differentiation is maintained. No hydrocephalus. Cerebral white matter hypodensities, similar to the prior CT and nonspecific but compatible with mild chronic small vessel ischemic disease. 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 Moderate global cerebral atrophy as well as asymmetrically severe volume loss in the right mesial temporal lobe. Calcified atherosclerosis at the skull base. ORBITS: Bilateral cataract extraction. SINUSES AND MASTOIDS: Minimal mucosal thickening in  right ethmoid air cells. Clear mastoid air cells. SOFT TISSUES AND SKULL: Small right-sided forehead scalp hematoma. No acute skull fracture. CT CERVICAL SPINE BONES AND ALIGNMENT: Unchanged grade 1 anterolisthesis of C5 on C6 and C6 on C7. Nondisplaced type 2 dens fracture, new from 2022 but of indeterminate age and potentially nonacute as some of the margins may be partially corticated. Intact posterior elements. DEGENERATIVE CHANGES: Advanced diffuse facet arthrosis. Focally advanced disc degeneration at C7-T1 with mild degeneration elsewhere. SOFT TISSUES: No significant prevertebral soft tissue swelling or fluid. IMPRESSION: 1. No acute intracranial abnormality. 2. Nondisplaced type 2 dens fracture, new from 2022 but of indeterminate age. No significant prevertebral soft tissue swelling or fluid. Electronically signed by: Dasie Hamburg MD 02/18/2024 11:11 AM EDT  RP Workstation: [id number]   DG Pelvis 1-2 Views Result Date: 02/18/2024 CLINICAL DATA:  [place]. EXAM: PELVIS - 1-2 VIEW COMPARISON:  04/09/2021 FINDINGS: Stable bilateral hip prostheses. No fracture or dislocation. Lower lumbar spine degenerative changes. IMPRESSION: No fracture or dislocation. Electronically Signed   By: Elspeth Bathe M.D.   On: 02/18/2024 11:11   DG Chest 1 View Result Date: 02/18/2024 CLINICAL DATA:  [place]. EXAM: CHEST  1 VIEW COMPARISON:  08/12/2023 FINDINGS: Again demonstrated is a very poor depth of inspiration. Grossly normal sized heart and clear lungs. No fracture or pneumothorax seen. Mild bilateral shoulder degenerative changes. Lower thoracic spine degenerative changes. IMPRESSION: No acute abnormality. Electronically Signed   By: Elspeth Bathe M.D.   On: 02/18/2024 11:10   EKG/Medicine tests: Ordered and Independent interpretation EKG Interpretation: Normal sinus rhythm Left axis deviation Low voltage QRS Incomplete right bundle branch block Nonspecific T wave changes in leads V1 through V4 had been previously demonstrated on EKG from January 2020 When compared with ECG of 18-Jan-2020 03:27, PREVIOUS ECG IS PRESENT Confirmed by Rogelia Satterfield ([id number]) on 02/18/2024 6:42:09 PM     Interventions: Tdap, Tylenol , bacitracin   See the EMR for full details regarding lab and imaging results.  Currently, patient is awake, alert, and protecting own airway and is hemodynamically stable.  Patient with neck pain and abrasions to the face without any underlying bony instability, is alert and oriented, however does not recall the exact mechanism of fall, however he believes it was related to not utilizing his walker.  He does not have any pain outside of his neck.  Given he is at risk for the [place] CT head and CT C-spine rules, do believe that he requires screening CT imaging of this area.  Additionally given age and unwitnessed fall, will obtain screening x-rays of the chest and pelvis,  as well as screening CBC, CMP, EKG.  Facial abrasions do not require any suture repair, however given patient is not up-to-date with tetanus, this will be updated, and bacitracin  was ordered to be applied to face.  Screening CBC, CMP, EKG reassuring.  Patient without focal abnormality on chest and pelvic x-rays, given these areas are clinically nontender, do not feel that patient requires additional imaging.  CT head is WNL.  CT of the C-spine does show age-indeterminate type II dens fracture.  Patient has normal neurologic exam, and pain is primarily in his lower neck, and paraspinal area on palpation.  I did consult neurosurgery and speak with Dr. Gillie who recommended hard collar at all times and follow-up in clinic in 2 weeks.  These findings were discussed with patient at bedside.  Patient expressed understanding.  Patient discharged, and is pending transportation back to facility at the time  of handoff, therefore diet and medications ordered.  Presentation is most consistent with acute complicated illness  Discussion of management or test interpretations with external provider(s): Neurosurgery, Dr. Gillie  Risk Drugs:OTC drugs and Prescription drug management  Disposition: DISCHARGE: I believe that the patient is safe for discharge home with outpatient follow-up. Patient was informed of all pertinent physical exam, laboratory, and imaging findings including type II dens fracture.  Patient's suspected etiology of their symptom presentation was discussed with the patient and all questions were answered. We discussed following up with PCP, neurosurgery. I provided thorough ED return precautions. The patient feels safe and comfortable with this plan.  MDM generated using voice dictation software and may contain dictation errors.  Please contact me for any clarification or with any questions.  Clinical Impression:  1. Closed nondisplaced odontoid fracture with type II morphology, initial  encounter (HCC)   2. Ground-level fall   3. Abrasion of face, initial encounter      Discharge   Final Clinical Impression(s) / ED Diagnoses Final diagnoses:  Ground-level fall  Abrasion of face, initial encounter  Closed nondisplaced odontoid fracture with type II morphology, initial encounter (HCC)    Rx / DC Orders ED Discharge Orders          Ordered    Ambulatory referral to Neurosurgery       Comments: Please Select To Department: [facility] for Nerve or Spine  Please select To Department: [facility] AT [place] for Cranial or Neurovascular   02/18/24 1351             Rogelia Jerilynn RAMAN, MD 02/18/24 1911

## 2024-02-18 NOTE — Discharge Instructions (Addendum)
 Vaughan KATHEE Sor  Thank you for allowing us  to take care of you today.  You came to the Emergency Department today because a fall at your facility earlier today.  Your EKG and labs were reassuring.  You are having some pain in your neck.  You have no bleeding in your head, you do have some scrapes on your face.  We are prescribing you an ointment called bacitracin  that you can put on your scrapes on your face every day.  Your chest x-ray and the x-ray of your hips did not show any abnormalities.  We did do a scan of your neck that shows a broken bone in your neck (type II dens fracture).  We put you in a hard cervical collar.  You need to wear this at all times, 24/7 including while bathing and while sleeping to help keep your neck bones in place.  We talked to the neurosurgeon (spine doctor), and they will see you in clinic in 2 weeks.  They should call you to schedule you in clinic, if you do not hear from them in 3 days, you can call their clinic at [phone].  To-Do: 1. Please follow-up with your primary doctor within 2 days / as soon as possible.   Please return to the Emergency Department or call 911 if you experience have worsening of your symptoms, or do not get better, difficulty moving or feeling your arms or legs, chest pain, shortness of breath, severe or significantly worsening pain, high fever, severe confusion, pass out or have any reason to think that you need emergency medical care.   We hope you feel better soon.   Mitzie Later, MD Department of Emergency Medicine [facility]

## 2024-02-19 ENCOUNTER — Inpatient Hospital Stay (HOSPITAL_COMMUNITY)
Admission: EM | Admit: 2024-02-19 | Discharge: 2024-03-01 | DRG: 871 | Disposition: A | Discharge status: Skilled Nursing Facility | Source: Skilled Nursing Facility | Attending: Hospitalist | Admitting: Hospitalist

## 2024-02-19 ENCOUNTER — Emergency Department (HOSPITAL_COMMUNITY)

## 2024-02-19 ENCOUNTER — Other Ambulatory Visit: Payer: Self-pay

## 2024-02-19 ENCOUNTER — Encounter (HOSPITAL_COMMUNITY): Payer: Self-pay

## 2024-02-19 DIAGNOSIS — R0989 Other specified symptoms and signs involving the circulatory and respiratory systems: Secondary | ICD-10-CM | POA: Diagnosis not present

## 2024-02-19 DIAGNOSIS — Z6831 Body mass index (BMI) 31.0-31.9, adult: Secondary | ICD-10-CM

## 2024-02-19 DIAGNOSIS — R296 Repeated falls: Secondary | ICD-10-CM | POA: Diagnosis present

## 2024-02-19 DIAGNOSIS — I21A1 Myocardial infarction type 2: Secondary | ICD-10-CM | POA: Diagnosis present

## 2024-02-19 DIAGNOSIS — K5730 Diverticulosis of large intestine without perforation or abscess without bleeding: Secondary | ICD-10-CM | POA: Diagnosis present

## 2024-02-19 DIAGNOSIS — I4891 Unspecified atrial fibrillation: Secondary | ICD-10-CM

## 2024-02-19 DIAGNOSIS — E785 Hyperlipidemia, unspecified: Secondary | ICD-10-CM | POA: Diagnosis present

## 2024-02-19 DIAGNOSIS — E860 Dehydration: Secondary | ICD-10-CM | POA: Diagnosis not present

## 2024-02-19 DIAGNOSIS — D72829 Elevated white blood cell count, unspecified: Secondary | ICD-10-CM | POA: Diagnosis present

## 2024-02-19 DIAGNOSIS — R1310 Dysphagia, unspecified: Secondary | ICD-10-CM | POA: Diagnosis present

## 2024-02-19 DIAGNOSIS — Z880 Allergy status to penicillin: Secondary | ICD-10-CM

## 2024-02-19 DIAGNOSIS — K219 Gastro-esophageal reflux disease without esophagitis: Secondary | ICD-10-CM | POA: Diagnosis present

## 2024-02-19 DIAGNOSIS — K8020 Calculus of gallbladder without cholecystitis without obstruction: Secondary | ICD-10-CM | POA: Diagnosis present

## 2024-02-19 DIAGNOSIS — Z888 Allergy status to other drugs, medicaments and biological substances status: Secondary | ICD-10-CM

## 2024-02-19 DIAGNOSIS — N400 Enlarged prostate without lower urinary tract symptoms: Secondary | ICD-10-CM | POA: Diagnosis present

## 2024-02-19 DIAGNOSIS — R404 Transient alteration of awareness: Secondary | ICD-10-CM | POA: Diagnosis not present

## 2024-02-19 DIAGNOSIS — T50995A Adverse effect of other drugs, medicaments and biological substances, initial encounter: Secondary | ICD-10-CM | POA: Diagnosis present

## 2024-02-19 DIAGNOSIS — Z1152 Encounter for screening for COVID-19: Secondary | ICD-10-CM

## 2024-02-19 DIAGNOSIS — Z79899 Other long term (current) drug therapy: Secondary | ICD-10-CM

## 2024-02-19 DIAGNOSIS — R000 Tachycardia, unspecified: Secondary | ICD-10-CM | POA: Diagnosis not present

## 2024-02-19 DIAGNOSIS — Z7902 Long term (current) use of antithrombotics/antiplatelets: Secondary | ICD-10-CM

## 2024-02-19 DIAGNOSIS — A419 Sepsis, unspecified organism: Principal | ICD-10-CM | POA: Diagnosis present

## 2024-02-19 DIAGNOSIS — F0390 Unspecified dementia without behavioral disturbance: Secondary | ICD-10-CM | POA: Diagnosis present

## 2024-02-19 DIAGNOSIS — F419 Anxiety disorder, unspecified: Secondary | ICD-10-CM | POA: Diagnosis present

## 2024-02-19 DIAGNOSIS — F05 Delirium due to known physiological condition: Secondary | ICD-10-CM | POA: Diagnosis not present

## 2024-02-19 DIAGNOSIS — W1830XA Fall on same level, unspecified, initial encounter: Secondary | ICD-10-CM | POA: Diagnosis present

## 2024-02-19 DIAGNOSIS — I700 Atherosclerosis of aorta: Secondary | ICD-10-CM | POA: Diagnosis present

## 2024-02-19 DIAGNOSIS — S0081XA Abrasion of other part of head, initial encounter: Secondary | ICD-10-CM | POA: Diagnosis present

## 2024-02-19 DIAGNOSIS — M810 Age-related osteoporosis without current pathological fracture: Secondary | ICD-10-CM | POA: Diagnosis present

## 2024-02-19 DIAGNOSIS — I110 Hypertensive heart disease with heart failure: Secondary | ICD-10-CM | POA: Diagnosis present

## 2024-02-19 DIAGNOSIS — I480 Paroxysmal atrial fibrillation: Secondary | ICD-10-CM | POA: Diagnosis present

## 2024-02-19 DIAGNOSIS — N179 Acute kidney failure, unspecified: Secondary | ICD-10-CM | POA: Diagnosis present

## 2024-02-19 DIAGNOSIS — R9089 Other abnormal findings on diagnostic imaging of central nervous system: Secondary | ICD-10-CM | POA: Diagnosis not present

## 2024-02-19 DIAGNOSIS — W19XXXA Unspecified fall, initial encounter: Secondary | ICD-10-CM | POA: Diagnosis not present

## 2024-02-19 DIAGNOSIS — I5A Non-ischemic myocardial injury (non-traumatic): Secondary | ICD-10-CM

## 2024-02-19 DIAGNOSIS — Z87891 Personal history of nicotine dependence: Secondary | ICD-10-CM

## 2024-02-19 DIAGNOSIS — Z87898 Personal history of other specified conditions: Secondary | ICD-10-CM

## 2024-02-19 DIAGNOSIS — Z9841 Cataract extraction status, right eye: Secondary | ICD-10-CM

## 2024-02-19 DIAGNOSIS — Z8261 Family history of arthritis: Secondary | ICD-10-CM

## 2024-02-19 DIAGNOSIS — Z9842 Cataract extraction status, left eye: Secondary | ICD-10-CM

## 2024-02-19 DIAGNOSIS — R410 Disorientation, unspecified: Secondary | ICD-10-CM | POA: Diagnosis not present

## 2024-02-19 DIAGNOSIS — Z7982 Long term (current) use of aspirin: Secondary | ICD-10-CM

## 2024-02-19 DIAGNOSIS — I2510 Atherosclerotic heart disease of native coronary artery without angina pectoris: Secondary | ICD-10-CM | POA: Diagnosis present

## 2024-02-19 DIAGNOSIS — Z793 Long term (current) use of hormonal contraceptives: Secondary | ICD-10-CM

## 2024-02-19 DIAGNOSIS — Y95 Nosocomial condition: Secondary | ICD-10-CM | POA: Diagnosis present

## 2024-02-19 DIAGNOSIS — Z23 Encounter for immunization: Secondary | ICD-10-CM

## 2024-02-19 DIAGNOSIS — S0990XA Unspecified injury of head, initial encounter: Secondary | ICD-10-CM | POA: Diagnosis not present

## 2024-02-19 DIAGNOSIS — R6520 Severe sepsis without septic shock: Secondary | ICD-10-CM | POA: Diagnosis present

## 2024-02-19 DIAGNOSIS — K449 Diaphragmatic hernia without obstruction or gangrene: Secondary | ICD-10-CM | POA: Diagnosis present

## 2024-02-19 DIAGNOSIS — Z882 Allergy status to sulfonamides status: Secondary | ICD-10-CM

## 2024-02-19 DIAGNOSIS — F39 Unspecified mood [affective] disorder: Secondary | ICD-10-CM | POA: Diagnosis present

## 2024-02-19 DIAGNOSIS — G9341 Metabolic encephalopathy: Secondary | ICD-10-CM | POA: Diagnosis present

## 2024-02-19 DIAGNOSIS — R21 Rash and other nonspecific skin eruption: Secondary | ICD-10-CM | POA: Diagnosis present

## 2024-02-19 DIAGNOSIS — Z96641 Presence of right artificial hip joint: Secondary | ICD-10-CM | POA: Diagnosis present

## 2024-02-19 DIAGNOSIS — S12101A Unspecified nondisplaced fracture of second cervical vertebra, initial encounter for closed fracture: Secondary | ICD-10-CM | POA: Diagnosis not present

## 2024-02-19 DIAGNOSIS — R1111 Vomiting without nausea: Secondary | ICD-10-CM | POA: Diagnosis not present

## 2024-02-19 DIAGNOSIS — E66811 Obesity, class 1: Secondary | ICD-10-CM | POA: Diagnosis present

## 2024-02-19 DIAGNOSIS — I214 Non-ST elevation (NSTEMI) myocardial infarction: Secondary | ICD-10-CM | POA: Diagnosis present

## 2024-02-19 DIAGNOSIS — G4733 Obstructive sleep apnea (adult) (pediatric): Secondary | ICD-10-CM | POA: Diagnosis present

## 2024-02-19 DIAGNOSIS — J189 Pneumonia, unspecified organism: Principal | ICD-10-CM | POA: Diagnosis present

## 2024-02-19 DIAGNOSIS — E119 Type 2 diabetes mellitus without complications: Secondary | ICD-10-CM

## 2024-02-19 DIAGNOSIS — S12112A Nondisplaced Type II dens fracture, initial encounter for closed fracture: Secondary | ICD-10-CM | POA: Diagnosis present

## 2024-02-19 DIAGNOSIS — I5032 Chronic diastolic (congestive) heart failure: Secondary | ICD-10-CM | POA: Diagnosis present

## 2024-02-19 DIAGNOSIS — E1110 Type 2 diabetes mellitus with ketoacidosis without coma: Secondary | ICD-10-CM | POA: Diagnosis present

## 2024-02-19 DIAGNOSIS — Y92009 Unspecified place in unspecified non-institutional (private) residence as the place of occurrence of the external cause: Secondary | ICD-10-CM

## 2024-02-19 DIAGNOSIS — I452 Bifascicular block: Secondary | ICD-10-CM | POA: Diagnosis present

## 2024-02-19 LAB — COMPREHENSIVE METABOLIC PANEL WITH GFR
ALT: 15 U/L (ref 0–44)
AST: 17 U/L (ref 15–41)
Albumin: 3.5 g/dL (ref 3.5–5.0)
Alkaline Phosphatase: 90 U/L (ref 38–126)
Anion gap: 22 — ABNORMAL HIGH (ref 5–15)
BUN: 26 mg/dL — ABNORMAL HIGH (ref 8–23)
CO2: 10 mmol/L — ABNORMAL LOW (ref 22–32)
Calcium: 8.9 mg/dL (ref 8.9–10.3)
Chloride: 109 mmol/L (ref 98–111)
Creatinine, Ser: 1.59 mg/dL — ABNORMAL HIGH (ref 0.61–1.24)
GFR, Estimated: 41 mL/min — ABNORMAL LOW (ref >60)
Glucose, Bld: 190 mg/dL — ABNORMAL HIGH (ref 70–99)
Potassium: 4.9 mmol/L (ref 3.5–5.1)
Sodium: 141 mmol/L (ref 135–145)
Total Bilirubin: 1.8 mg/dL — ABNORMAL HIGH (ref 0.0–1.2)
Total Protein: 6.9 g/dL (ref 6.5–8.1)

## 2024-02-19 LAB — URINALYSIS, W/ REFLEX TO CULTURE (INFECTION SUSPECTED)
Bilirubin Urine: NEGATIVE
Glucose, UA: >=500 mg/dL — AB
Hgb urine dipstick: NEGATIVE
Ketones, ur: 80 mg/dL — AB
Leukocytes,Ua: NEGATIVE
Nitrite: NEGATIVE
Protein, ur: NEGATIVE mg/dL
Specific Gravity, Urine: 1.021 (ref 1.005–1.030)
pH: 5 (ref 5.0–8.0)

## 2024-02-19 LAB — CBC WITH DIFFERENTIAL/PLATELET
Abs Immature Granulocytes: 0.08 K/uL — ABNORMAL HIGH (ref 0.00–0.07)
Basophils Absolute: 0 K/uL (ref 0.0–0.1)
Basophils Relative: 0 %
Eosinophils Absolute: 0 K/uL (ref 0.0–0.5)
Eosinophils Relative: 0 %
HCT: 44.3 % (ref 39.0–52.0)
Hemoglobin: 14.2 g/dL (ref 13.0–17.0)
Immature Granulocytes: 1 %
Lymphocytes Relative: 5 %
Lymphs Abs: 0.9 K/uL (ref 0.7–4.0)
MCH: 30 pg (ref 26.0–34.0)
MCHC: 32.1 g/dL (ref 30.0–36.0)
MCV: 93.7 fL (ref 80.0–100.0)
Monocytes Absolute: 1.4 K/uL — ABNORMAL HIGH (ref 0.1–1.0)
Monocytes Relative: 8 %
Neutro Abs: 14.3 K/uL — ABNORMAL HIGH (ref 1.7–7.7)
Neutrophils Relative %: 86 %
Platelets: 213 K/uL (ref 150–400)
RBC: 4.73 MIL/uL (ref 4.22–5.81)
RDW: 13.7 % (ref 11.5–15.5)
WBC: 16.6 K/uL — ABNORMAL HIGH (ref 4.0–10.5)
nRBC: 0 % (ref 0.0–0.2)

## 2024-02-19 LAB — PROTIME-INR
INR: 1.4 — ABNORMAL HIGH (ref 0.8–1.2)
Prothrombin Time: 17.7 s — ABNORMAL HIGH (ref 11.4–15.2)

## 2024-02-19 LAB — RESP PANEL BY RT-PCR (RSV, FLU A&B, COVID)  RVPGX2
Influenza A by PCR: NEGATIVE
Influenza B by PCR: NEGATIVE
Resp Syncytial Virus by PCR: NEGATIVE
SARS Coronavirus 2 by RT PCR: NEGATIVE

## 2024-02-19 LAB — I-STAT CG4 LACTIC ACID, ED: Lactic Acid, Venous: 1.8 mmol/L (ref 0.5–1.9)

## 2024-02-19 MED ORDER — VANCOMYCIN HCL 1250 MG/250ML IV SOLN
1250.0000 mg | Freq: Once | INTRAVENOUS | Status: AC
  Administered 2024-02-19: 1250 mg via INTRAVENOUS
  Filled 2024-02-19: qty 250

## 2024-02-19 MED ORDER — LACTATED RINGERS IV SOLN: INTRAVENOUS | Status: DC

## 2024-02-19 MED ORDER — ACETAMINOPHEN 325 MG PO TABS
975.0000 mg | ORAL_TABLET | Freq: Once | ORAL | Status: AC
  Administered 2024-02-19: 975 mg via ORAL
  Filled 2024-02-19: qty 3

## 2024-02-19 MED ORDER — LACTATED RINGERS IV BOLUS (SEPSIS)
1000.0000 mL | Freq: Once | INTRAVENOUS | Status: AC
  Administered 2024-02-19: 1000 mL via INTRAVENOUS

## 2024-02-19 MED ORDER — SODIUM CHLORIDE 0.9 % IV SOLN
2.0000 g | Freq: Once | INTRAVENOUS | Status: AC
  Administered 2024-02-19: 2 g via INTRAVENOUS
  Filled 2024-02-19: qty 12.5

## 2024-02-19 MED ORDER — LACTATED RINGERS IV BOLUS (SEPSIS)
250.0000 mL | Freq: Once | INTRAVENOUS | Status: AC
  Administered 2024-02-19: 250 mL via INTRAVENOUS

## 2024-02-19 MED ORDER — VANCOMYCIN HCL IN DEXTROSE 1-5 GM/200ML-% IV SOLN: 1000.0000 mg | Freq: Once | INTRAVENOUS | Status: DC

## 2024-02-19 MED ORDER — SODIUM CHLORIDE 0.9 % IV SOLN
2.0000 g | Freq: Once | INTRAVENOUS | Status: DC
  Administered 2024-02-19: 2 g via INTRAVENOUS
  Filled 2024-02-19: qty 10

## 2024-02-19 MED ORDER — IOHEXOL 350 MG/ML SOLN
75.0000 mL | Freq: Once | INTRAVENOUS | Status: AC | PRN
  Administered 2024-02-19: 75 mL via INTRAVENOUS

## 2024-02-19 MED ORDER — NOREPINEPHRINE 4 MG/250ML-% IV SOLN: 0.0000 ug/min | INTRAVENOUS | Status: DC

## 2024-02-19 NOTE — ED Triage Notes (Addendum)
 Pt arrives EMS from [facility] with reports of altered mental status today. Pt was seen yesterday for fall and arrives in c-collar. Per facility pt was taking clothes off and not acting himself. Pt is opreinted x3 which is baseline per facility. Pt had one episode of emesis with EMS.  Pt received 4mg  zofran  and 500mL NS.

## 2024-02-19 NOTE — ED Notes (Signed)
 EDP at bedside

## 2024-02-19 NOTE — ED Provider Notes (Signed)
 [place] EMERGENCY DEPARTMENT AT [facility] Provider Note   CSN: [id number] Arrival date & time: 02/19/24  8057     Patient presents with: Altered Mental Status and Fever   Matthew Calderon is a 89 y.o. male.With a past medical history of repeated falls, CAD, CHF diabetes who returns to the ED for concern of infection.  Patient was seen yesterday in the [facility] emergency department after a fall at his skilled nursing facility [facility]).  He was found to have a type II dens fracture at that time and remains in a cervical collar.  The staff at [facility] voiced concern for fever and altered mental status today.  No overt infectious symptoms.  The patient himself is unable to provide additional history but denies pain in the chest, back, abdomen and extremities.    Altered Mental Status Associated symptoms: fever   Fever      Prior to Admission medications   Medication Sig Start Date End Date Taking? Authorizing Provider  acetaminophen  (TYLENOL ) 500 MG tablet Take 650 mg by mouth 2 (two) times daily as needed for mild pain (pain score 1-3) or moderate pain (pain score 4-6). 11/17/13   [provider]  aspirin  81 MG chewable tablet Chew 81 mg by mouth daily. 01/14/20   [provider]  atorvastatin  (LIPITOR) 40 MG tablet Take 1 tablet (40 mg total) by mouth daily at 6 PM. Patient taking differently: Take 40 mg by mouth every evening. 06/26/18   Elgergawy, Brayton RAMAN, MD  bacitracin  ointment Apply 1 Application topically 2 (two) times daily. 02/18/24   Rogelia Jerilynn RAMAN, MD  brimonidine  (ALPHAGAN ) 0.2 % ophthalmic solution Place 1 drop into both eyes 2 (two) times daily. 09/22/17   [provider]  carvedilol  (COREG ) 6.25 MG tablet Take 6.25 mg by mouth 2 (two) times daily with a meal.    [provider]  cetirizine (ZYRTEC) 10 MG tablet Take 10 mg by mouth daily.    [provider]  CHOLECALCIFEROL  PO Take 1,000 mcg by mouth every  morning.    [provider]  Empagliflozin-Linaglip-Metform (TRIJARDY XR) 25-10-998 MG TB24 Take 1 tablet by mouth daily.    [provider]  Ferrous Sulfate  Dried 143 (45 Fe) MG TBCR Take 45 mg by mouth daily.    [provider]  finasteride  (PROSCAR ) 5 MG tablet Take 5 mg by mouth every evening.  03/11/19   [provider]  furosemide  (LASIX ) 20 MG tablet Take 20 mg by mouth daily as needed for fluid or edema.    [provider]  guaiFENesin  (MUCINEX ) 600 MG 12 hr tablet Take 1 tablet (600 mg total) by mouth 2 (two) times daily. 08/15/23   Christobal Guadalajara, MD  mirabegron  ER (MYRBETRIQ ) 50 MG TB24 tablet Take 50 mg by mouth daily.    [provider]  Multiple Vitamins-Minerals (PRESERVISION AREDS 2) CAPS Take 1 capsule by mouth 2 (two) times daily.     [provider]  potassium chloride  (KLOR-CON ) 10 MEQ tablet Take 10 mEq by mouth daily.    [provider]  sacubitril -valsartan  (ENTRESTO ) 24-26 MG Take 1 tablet by mouth 2 (two) times daily.    [provider]  sertraline  (ZOLOFT ) 50 MG tablet Take 50 mg by mouth every evening.    [provider]  SYNJARDY XR 25-1000 MG TB24 Take 1 tablet by mouth daily.  04/22/19   [provider]  tamsulosin  (FLOMAX ) 0.4 MG CAPS capsule Take 0.4  mg by mouth at bedtime.    [provider]  timolol  (TIMOPTIC ) 0.5 % ophthalmic solution Place 1 drop into both eyes 2 (two) times daily. 09/22/17   [provider]    Allergies: Alendronate sodium, Penicillin g, Penicillins, Sulfa antibiotics, and Sulfacetamide sodium    Review of Systems  Constitutional:  Positive for fever.    Updated Vital Signs BP 115/62   Pulse (!) 114   Temp 98.4 F (36.9 C)   Resp (!) 32   SpO2 98%   Physical Exam Vitals and nursing note reviewed.  HENT:     Head:     Comments: Scattered abrasions over frontal region Eyes:     Extraocular Movements: Extraocular movements  intact.     Pupils: Pupils are equal, round, and reactive to light.  Neck:     Comments: Cervical collar in place Cardiovascular:     Rate and Rhythm: Normal rate and regular rhythm.  Pulmonary:     Effort: Pulmonary effort is normal.     Breath sounds: Normal breath sounds.  Abdominal:     Palpations: Abdomen is soft.     Tenderness: There is no abdominal tenderness.  Musculoskeletal:     Comments: No decubitus ulcer  Skin:    General: Skin is warm and dry.  Neurological:     Mental Status: He is alert.     Comments: Oriented to to self and place 5 out of 5 motor strength bilateral upper and lower extremities Sensation tact light touch throughout   Psychiatric:        Mood and Affect: Mood normal.     (all labs ordered are listed, but only abnormal results are displayed) Labs Reviewed  COMPREHENSIVE METABOLIC PANEL WITH GFR - Abnormal; Notable for the following components:      Result Value   CO2 10 (*)    Glucose, Bld 190 (*)    BUN 26 (*)    Creatinine, Ser 1.59 (*)    Total Bilirubin 1.8 (*)    GFR, Estimated 41 (*)    Anion gap 22 (*)    All other components within normal limits  CBC WITH DIFFERENTIAL/PLATELET - Abnormal; Notable for the following components:   WBC 16.6 (*)    Neutro Abs 14.3 (*)    Monocytes Absolute 1.4 (*)    Abs Immature Granulocytes 0.08 (*)    All other components within normal limits  PROTIME-INR - Abnormal; Notable for the following components:   Prothrombin Time 17.7 (*)    INR 1.4 (*)    All other components within normal limits  URINALYSIS, W/ REFLEX TO CULTURE (INFECTION SUSPECTED) - Abnormal; Notable for the following components:   Glucose, UA >=500 (*)    Ketones, ur 80 (*)    Bacteria, UA RARE (*)    All other components within normal limits  RESP PANEL BY RT-PCR (RSV, FLU A&B, COVID)  RVPGX2  CULTURE, BLOOD (ROUTINE X 2)  CULTURE, BLOOD (ROUTINE X 2)  I-STAT CG4 LACTIC ACID, ED    EKG: None  Radiology: CT CHEST  ABDOMEN PELVIS W CONTRAST Result Date: 02/19/2024 CLINICAL DATA:  Sepsis Head trauma, recent fall w/ type 2 dens fx, sepsis EXAM: CT CHEST, ABDOMEN, AND PELVIS WITH CONTRAST TECHNIQUE: Multidetector CT imaging of the chest, abdomen and pelvis was performed following the standard protocol during bolus administration of intravenous contrast. RADIATION DOSE REDUCTION: This exam was performed according to the departmental dose-optimization program which includes automated exposure control, adjustment of  the mA and/or kV according to patient size and/or use of iterative reconstruction technique. CONTRAST:  75mL OMNIPAQUE  IOHEXOL  350 MG/ML SOLN COMPARISON:  None Available. FINDINGS: CHEST: Cardiovascular: No aortic injury. The thoracic aorta is normal in caliber. The heart is normal in size. No significant pericardial effusion. Four-vessel coronary artery calcification. Aortic valve leaflet calcification. Moderate severe atherosclerotic plaque. The main pulmonary artery is normal in caliber. No central pulmonary embolus. Mediastinum/Nodes: No pneumomediastinum. No mediastinal hematoma. The esophagus is unremarkable.  Small hiatal hernia. The thyroid  is unremarkable. The central airways are patent. No mediastinal, hilar, or axillary lymphadenopathy. Lungs/Pleura: Bilateral lower lobe patchy airspace opacities. No pulmonary nodule. No pulmonary mass. No pulmonary contusion or laceration. No pneumatocele formation. No pleural effusion. No pneumothorax. No hemothorax. Musculoskeletal/Chest wall: No chest wall mass. Old healed right rib fracture. No acute rib or sternal fracture. No spinal fracture. Severe left shoulder degenerative changes. ABDOMEN / PELVIS: Hepatobiliary: Not enlarged. No focal lesion. No laceration or subcapsular hematoma. Calcified gallstones within the gallbladder lumen. No gallbladder wall thickening or pericholecystic fluid. No biliary ductal dilatation. Pancreas: Diffusely atrophic. Otherwise normal  pancreatic contour. No main pancreatic duct dilatation. Spleen: Not enlarged. No focal lesion. No laceration, subcapsular hematoma, or vascular injury. Splenule noted. Adrenals/Urinary Tract: No nodularity bilaterally. Bilateral kidneys enhance symmetrically. No hydronephrosis. No contusion, laceration, or subcapsular hematoma. No injury to the vascular structures or collecting systems. No hydroureter. The urinary bladder is unremarkable. On delayed imaging, there is no urothelial wall thickening and there are no filling defects in the opacified portions of the bilateral collecting systems or ureters. Stomach/Bowel: No small or large bowel wall thickening or dilatation. Colonic diverticulosis. The appendix is unremarkable. Vasculature/Lymphatics: No abdominal aorta or iliac aneurysm. No active contrast extravasation or pseudoaneurysm. No abdominal, pelvic, inguinal lymphadenopathy. Reproductive: Prostate is enlarged measuring up to 5 cm. Other: No simple free fluid ascites. No pneumoperitoneum. No hemoperitoneum. No mesenteric hematoma identified. No organized fluid collection. Musculoskeletal: No significant soft tissue hematoma. No acute pelvic fracture. No spinal fracture. Multilevel severe degenerative changes of the spine. Mild retrolisthesis of L5 on S1. Total bilateral hip arthroplasty partially visualized. Other ports and devices: None. IMPRESSION: 1. Bilateral lower lobe patchy airspace opacities. Finding may represent atelectasis with superimposed infection. 2. No acute intrathoracic, intra-abdominal, intrapelvic traumatic injury. 3. No acute fracture or traumatic malalignment of the thoracic or lumbar spine. 4. Aortic Atherosclerosis (ICD10-I70.0) including four-vessel coronary artery and aortic valve leaflet calcifications-correlate for aortic stenosis. Cholelithiasis with no CT evidence of acute cholecystitis. Colonic diverticulosis with no acute diverticulitis. Small hiatal hernia. Prostatomegaly.  Electronically Signed   By: Morgane  Naveau M.D.   On: 02/19/2024 23:09   CT Head Wo Contrast Result Date: 02/19/2024 CLINICAL DATA:  Head trauma, minor (Age >= 65y); recent fall w type 2 dens fx EXAM: CT HEAD WITHOUT CONTRAST CT CERVICAL SPINE WITHOUT CONTRAST TECHNIQUE: Multidetector CT imaging of the head and cervical spine was performed following the standard protocol without intravenous contrast. Multiplanar CT image reconstructions of the cervical spine were also generated. RADIATION DOSE REDUCTION: This exam was performed according to the departmental dose-optimization program which includes automated exposure control, adjustment of the mA and/or kV according to patient size and/or use of iterative reconstruction technique. COMPARISON:  CT head and C-spine 02/18/2024 FINDINGS: CT HEAD FINDINGS Brain: Cerebral ventricle sizes are concordant with the degree of cerebral volume loss. Trace patchy and confluent areas of decreased attenuation are noted throughout the deep and periventricular white matter of the cerebral hemispheres  bilaterally, compatible with chronic microvascular ischemic disease. No evidence of large-territorial acute infarction. No parenchymal hemorrhage. No mass lesion. No extra-axial collection. No mass effect or midline shift. No hydrocephalus. Basilar cisterns are patent. Vascular: No hyperdense vessel. Atherosclerotic calcifications are present within the cavernous internal carotid and vertebral arteries. Skull: No acute fracture or focal lesion. Sinuses/Orbits: Paranasal sinuses and mastoid air cells are clear. Bilateral lens replacement. Otherwise the orbits are unremarkable. Other: None. CT CERVICAL SPINE FINDINGS Alignment: Stable grade 1 anterolisthesis of C5 on C6 and C6 on C7. Skull base and vertebrae: Redemonstration of acute C2 type 2 dens fracture slightly more displaced with a 2 mm distraction. Multilevel severe degenerative changes of the spine with associated moderate to  severe osseous neural foraminal stenosis. No severe osseous central canal stenosis. No aggressive appearing focal osseous lesion or focal pathologic process. Soft tissues and spinal canal: No prevertebral fluid or swelling. No visible canal hematoma. Upper chest: Unremarkable. Other: None. IMPRESSION: 1.  No acute intracranial abnormality. 2. Redemonstration of acute C2 type 2 dens fracture slightly more displaced with a 2 mm distraction. Electronically Signed   By: Morgane  Naveau M.D.   On: 02/19/2024 22:59   CT Cervical Spine Wo Contrast Result Date: 02/19/2024 CLINICAL DATA:  Head trauma, minor (Age >= 65y); recent fall w type 2 dens fx EXAM: CT HEAD WITHOUT CONTRAST CT CERVICAL SPINE WITHOUT CONTRAST TECHNIQUE: Multidetector CT imaging of the head and cervical spine was performed following the standard protocol without intravenous contrast. Multiplanar CT image reconstructions of the cervical spine were also generated. RADIATION DOSE REDUCTION: This exam was performed according to the departmental dose-optimization program which includes automated exposure control, adjustment of the mA and/or kV according to patient size and/or use of iterative reconstruction technique. COMPARISON:  CT head and C-spine 02/18/2024 FINDINGS: CT HEAD FINDINGS Brain: Cerebral ventricle sizes are concordant with the degree of cerebral volume loss. Trace patchy and confluent areas of decreased attenuation are noted throughout the deep and periventricular white matter of the cerebral hemispheres bilaterally, compatible with chronic microvascular ischemic disease. No evidence of large-territorial acute infarction. No parenchymal hemorrhage. No mass lesion. No extra-axial collection. No mass effect or midline shift. No hydrocephalus. Basilar cisterns are patent. Vascular: No hyperdense vessel. Atherosclerotic calcifications are present within the cavernous internal carotid and vertebral arteries. Skull: No acute fracture or focal  lesion. Sinuses/Orbits: Paranasal sinuses and mastoid air cells are clear. Bilateral lens replacement. Otherwise the orbits are unremarkable. Other: None. CT CERVICAL SPINE FINDINGS Alignment: Stable grade 1 anterolisthesis of C5 on C6 and C6 on C7. Skull base and vertebrae: Redemonstration of acute C2 type 2 dens fracture slightly more displaced with a 2 mm distraction. Multilevel severe degenerative changes of the spine with associated moderate to severe osseous neural foraminal stenosis. No severe osseous central canal stenosis. No aggressive appearing focal osseous lesion or focal pathologic process. Soft tissues and spinal canal: No prevertebral fluid or swelling. No visible canal hematoma. Upper chest: Unremarkable. Other: None. IMPRESSION: 1.  No acute intracranial abnormality. 2. Redemonstration of acute C2 type 2 dens fracture slightly more displaced with a 2 mm distraction. Electronically Signed   By: Morgane  Naveau M.D.   On: 02/19/2024 22:59   DG Chest Port 1 View Result Date: 02/19/2024 CLINICAL DATA:  Questionable sepsis - evaluate for abnormality EXAM: PORTABLE CHEST 1 VIEW COMPARISON:  Chest x-ray 02/18/2024 FINDINGS: The heart and mediastinal contours are unchanged. Atherosclerotic plaque. Low lung volumes. No focal consolidation. No pulmonary edema. No  pleural effusion. No pneumothorax. No acute osseous abnormality.  Old healed right rib fracture. IMPRESSION: 1. No active disease. 2.  Aortic Atherosclerosis (ICD10-I70.0). Electronically Signed   By: Morgane  Naveau M.D.   On: 02/19/2024 21:21   CT HEAD WO CONTRAST Result Date: 02/18/2024 EXAM: CT HEAD AND CERVICAL SPINE 02/18/2024 10:42:31 AM TECHNIQUE: CT of the head and cervical spine was performed without the administration of intravenous contrast. Multiplanar reformatted images are provided for review. Automated exposure control, iterative reconstruction, and/or weight based adjustment of the mA/kV was utilized to reduce the radiation  dose to as low as reasonably achievable. COMPARISON: CT head and cervical spine 04/09/2021. CLINICAL HISTORY: Polytrauma, blunt. unwitnessed fall. Abrasion and brusing to forehead and chin. Pt c/o neck discomfort; no pain. Hx of DM2 CBG 210. No blood thinners. AAOx4. Unknown LOC. Pt uses walker. FINDINGS: CT HEAD BRAIN AND VENTRICLES: No acute intracranial hemorrhage. No mass effect or midline shift. No abnormal extra-axial fluid collection. Gray-white differentiation is maintained. No hydrocephalus. Cerebral white matter hypodensities, similar to the prior CT and nonspecific but compatible with mild chronic small vessel ischemic disease. 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 Moderate global cerebral atrophy as well as asymmetrically severe volume loss in the right mesial temporal lobe. Calcified atherosclerosis at the skull base. ORBITS: Bilateral cataract extraction. SINUSES AND MASTOIDS: Minimal mucosal thickening in right ethmoid air cells. Clear mastoid air cells. SOFT TISSUES AND SKULL: Small right-sided forehead scalp hematoma. No acute skull fracture. CT CERVICAL SPINE BONES AND ALIGNMENT: Unchanged grade 1 anterolisthesis of C5 on C6 and C6 on C7. Nondisplaced type 2 dens fracture, new from 2022 but of indeterminate age and potentially nonacute as some of the margins may be partially corticated. Intact posterior elements. DEGENERATIVE CHANGES: Advanced diffuse facet arthrosis. Focally advanced disc degeneration at C7-T1 with mild degeneration elsewhere. SOFT TISSUES: No significant prevertebral soft tissue swelling or fluid. IMPRESSION: 1. No acute intracranial abnormality. 2. Nondisplaced type 2 dens fracture, new from 2022 but of indeterminate age. No significant prevertebral soft tissue swelling or fluid. Electronically signed by: Dasie Hamburg MD 02/18/2024 11:11 AM EDT RP Workstation: [id number]   CT CERVICAL SPINE WO CONTRAST Result Date: 02/18/2024 EXAM: CT HEAD AND CERVICAL SPINE  02/18/2024 10:42:31 AM TECHNIQUE: CT of the head and cervical spine was performed without the administration of intravenous contrast. Multiplanar reformatted images are provided for review. Automated exposure control, iterative reconstruction, and/or weight based adjustment of the mA/kV was utilized to reduce the radiation dose to as low as reasonably achievable. COMPARISON: CT head and cervical spine 04/09/2021. CLINICAL HISTORY: Polytrauma, blunt. unwitnessed fall. Abrasion and brusing to forehead and chin. Pt c/o neck discomfort; no pain. Hx of DM2 CBG 210. No blood thinners. AAOx4. Unknown LOC. Pt uses walker. FINDINGS: CT HEAD BRAIN AND VENTRICLES: No acute intracranial hemorrhage. No mass effect or midline shift. No abnormal extra-axial fluid collection. Gray-white differentiation is maintained. No hydrocephalus. Cerebral white matter hypodensities, similar to the prior CT and nonspecific but compatible with mild chronic small vessel ischemic disease. 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 Moderate global cerebral atrophy as well as asymmetrically severe volume loss in the right mesial temporal lobe. Calcified atherosclerosis at the skull base. ORBITS: Bilateral cataract extraction. SINUSES AND MASTOIDS: Minimal mucosal thickening in right ethmoid air cells. Clear mastoid air cells. SOFT TISSUES AND SKULL: Small right-sided forehead scalp hematoma. No acute skull fracture. CT CERVICAL SPINE BONES AND ALIGNMENT: Unchanged grade 1 anterolisthesis of C5 on C6 and C6 on C7.  Nondisplaced type 2 dens fracture, new from 2022 but of indeterminate age and potentially nonacute as some of the margins may be partially corticated. Intact posterior elements. DEGENERATIVE CHANGES: Advanced diffuse facet arthrosis. Focally advanced disc degeneration at C7-T1 with mild degeneration elsewhere. SOFT TISSUES: No significant prevertebral soft tissue swelling or fluid. IMPRESSION: 1. No acute intracranial abnormality.  2. Nondisplaced type 2 dens fracture, new from 2022 but of indeterminate age. No significant prevertebral soft tissue swelling or fluid. Electronically signed by: Dasie Hamburg MD 02/18/2024 11:11 AM EDT RP Workstation: [id number]   DG Pelvis 1-2 Views Result Date: 02/18/2024 CLINICAL DATA:  [place]. EXAM: PELVIS - 1-2 VIEW COMPARISON:  04/09/2021 FINDINGS: Stable bilateral hip prostheses. No fracture or dislocation. Lower lumbar spine degenerative changes. IMPRESSION: No fracture or dislocation. Electronically Signed   By: Elspeth Bathe M.D.   On: 02/18/2024 11:11   DG Chest 1 View Result Date: 02/18/2024 CLINICAL DATA:  [place]. EXAM: CHEST  1 VIEW COMPARISON:  08/12/2023 FINDINGS: Again demonstrated is a very poor depth of inspiration. Grossly normal sized heart and clear lungs. No fracture or pneumothorax seen. Mild bilateral shoulder degenerative changes. Lower thoracic spine degenerative changes. IMPRESSION: No acute abnormality. Electronically Signed   By: Elspeth Bathe M.D.   On: 02/18/2024 11:10     .Critical Care  Performed by: Pamella Ozell LABOR, DO Authorized by: Pamella Ozell LABOR, DO   Critical care provider statement:    Critical care time (minutes):  30   Critical care was necessary to treat or prevent imminent or life-threatening deterioration of the following conditions:  Sepsis   Critical care was time spent personally by me on the following activities:  Development of treatment plan with patient or surrogate, discussions with consultants, evaluation of patient's response to treatment, examination of patient, ordering and review of laboratory studies, ordering and review of radiographic studies, ordering and performing treatments and interventions, pulse oximetry, re-evaluation of patient's condition, review of old charts and obtaining history from patient or surrogate   I assumed direction of critical care for this patient from another provider in my specialty: no     Care discussed with:  admitting provider      Medications Ordered in the ED  lactated ringers  infusion ( Intravenous New Bag/Given 02/19/24 2305)  vancomycin  (VANCOREADY) IVPB 1250 mg/250 mL (1,250 mg Intravenous New Bag/Given 02/19/24 2204)  lactated ringers  bolus 1,000 mL (0 mLs Intravenous Stopped 02/19/24 2128)    And  lactated ringers  bolus 1,000 mL (0 mLs Intravenous Stopped 02/19/24 2128)    And  lactated ringers  bolus 250 mL (0 mLs Intravenous Stopped 02/19/24 2249)  acetaminophen  (TYLENOL ) tablet 975 mg (975 mg Oral Given 02/19/24 2126)  ceFEPIme  (MAXIPIME ) 2 g in sodium chloride  0.9 % 100 mL IVPB (0 g Intravenous Stopped 02/19/24 2200)  iohexol  (OMNIPAQUE ) 350 MG/ML injection 75 mL (75 mLs Intravenous Contrast Given 02/19/24 2249)    Clinical Course as of 02/19/24 2358  Thu Feb 19, 2024  2324 Laboratory workup notable for leukocytosis of 16.6 up significantly from yesterday.  Slight elevation in creatinine.  UA without evidence of UTI.  Venous lactic not elevated.  CT head without acute findings.  CT C-spine again redemonstrates type II dens fracture.  Chest abdomen pelvis concerning for pneumonia as is likely source.  Has received 30 cc/kg bolus.  MAP is held above 65.  Will admit to medicine [MP]  2356 Discussed with admitting hospitalist who accepts patient for admission [MP]    Clinical Course User  Index [MP] Pamella Ozell LABOR, DO                                 Medical Decision Making 89 year old male with history as above seen yesterday after mechanical fall at skilled nursing facility.  Found to have type II dens fracture now back in the ED given concern for acute infection.  Febrile tachycardic global confusion.  No obvious source.  Will obtain infectious workup provide IV fluids.  Meeting code sepsis based on tachycardia tachypnea fever.  Will cover for suspected respiratory source as he has a history of sepsis due to pneumonia.  Considering recent mental status changes and yesterday's fall with known  type II dens fracture will obtain CT head C-spine along with chest abdomen pelvis to look for evidence of thoracic or abdominal infectious process.  Admission likely  Amount and/or Complexity of Data Reviewed Labs: ordered. Radiology: ordered.  Risk OTC drugs. Prescription drug management.        Final diagnoses:  Healthcare-associated pneumonia  Sepsis, due to unspecified organism, unspecified whether acute organ dysfunction present [facility])    ED Discharge Orders     None          Pamella Ozell LABOR, DO 02/19/24 2328

## 2024-02-19 NOTE — Sepsis Progress Note (Signed)
 Elink monitoring for the code sepsis protocol.

## 2024-02-19 NOTE — ED Notes (Signed)
 Pt to CT

## 2024-02-20 ENCOUNTER — Inpatient Hospital Stay (HOSPITAL_COMMUNITY)

## 2024-02-20 DIAGNOSIS — I214 Non-ST elevation (NSTEMI) myocardial infarction: Secondary | ICD-10-CM

## 2024-02-20 DIAGNOSIS — J189 Pneumonia, unspecified organism: Secondary | ICD-10-CM

## 2024-02-20 DIAGNOSIS — R1310 Dysphagia, unspecified: Secondary | ICD-10-CM | POA: Diagnosis present

## 2024-02-20 DIAGNOSIS — N400 Enlarged prostate without lower urinary tract symptoms: Secondary | ICD-10-CM | POA: Diagnosis present

## 2024-02-20 DIAGNOSIS — I21A1 Myocardial infarction type 2: Secondary | ICD-10-CM | POA: Diagnosis not present

## 2024-02-20 DIAGNOSIS — E785 Hyperlipidemia, unspecified: Secondary | ICD-10-CM | POA: Diagnosis present

## 2024-02-20 DIAGNOSIS — I5A Non-ischemic myocardial injury (non-traumatic): Secondary | ICD-10-CM | POA: Diagnosis not present

## 2024-02-20 DIAGNOSIS — I4891 Unspecified atrial fibrillation: Secondary | ICD-10-CM | POA: Diagnosis not present

## 2024-02-20 DIAGNOSIS — E1110 Type 2 diabetes mellitus with ketoacidosis without coma: Secondary | ICD-10-CM

## 2024-02-20 DIAGNOSIS — G9341 Metabolic encephalopathy: Secondary | ICD-10-CM | POA: Diagnosis not present

## 2024-02-20 DIAGNOSIS — I2510 Atherosclerotic heart disease of native coronary artery without angina pectoris: Secondary | ICD-10-CM | POA: Diagnosis present

## 2024-02-20 DIAGNOSIS — R7989 Other specified abnormal findings of blood chemistry: Secondary | ICD-10-CM | POA: Diagnosis not present

## 2024-02-20 DIAGNOSIS — Y95 Nosocomial condition: Secondary | ICD-10-CM | POA: Diagnosis present

## 2024-02-20 DIAGNOSIS — S12112A Nondisplaced Type II dens fracture, initial encounter for closed fracture: Secondary | ICD-10-CM | POA: Diagnosis not present

## 2024-02-20 DIAGNOSIS — F05 Delirium due to known physiological condition: Secondary | ICD-10-CM | POA: Diagnosis not present

## 2024-02-20 DIAGNOSIS — I480 Paroxysmal atrial fibrillation: Secondary | ICD-10-CM | POA: Diagnosis not present

## 2024-02-20 DIAGNOSIS — F39 Unspecified mood [affective] disorder: Secondary | ICD-10-CM | POA: Diagnosis present

## 2024-02-20 DIAGNOSIS — F0390 Unspecified dementia without behavioral disturbance: Secondary | ICD-10-CM | POA: Diagnosis present

## 2024-02-20 DIAGNOSIS — I452 Bifascicular block: Secondary | ICD-10-CM | POA: Diagnosis present

## 2024-02-20 DIAGNOSIS — Z23 Encounter for immunization: Secondary | ICD-10-CM | POA: Diagnosis not present

## 2024-02-20 DIAGNOSIS — E860 Dehydration: Secondary | ICD-10-CM | POA: Diagnosis not present

## 2024-02-20 DIAGNOSIS — E872 Acidosis, unspecified: Secondary | ICD-10-CM | POA: Diagnosis not present

## 2024-02-20 DIAGNOSIS — I428 Other cardiomyopathies: Secondary | ICD-10-CM

## 2024-02-20 DIAGNOSIS — R6520 Severe sepsis without septic shock: Secondary | ICD-10-CM | POA: Diagnosis present

## 2024-02-20 DIAGNOSIS — A419 Sepsis, unspecified organism: Secondary | ICD-10-CM

## 2024-02-20 DIAGNOSIS — E66811 Obesity, class 1: Secondary | ICD-10-CM | POA: Diagnosis present

## 2024-02-20 DIAGNOSIS — Z1152 Encounter for screening for COVID-19: Secondary | ICD-10-CM | POA: Diagnosis not present

## 2024-02-20 DIAGNOSIS — I700 Atherosclerosis of aorta: Secondary | ICD-10-CM | POA: Diagnosis present

## 2024-02-20 DIAGNOSIS — Y92009 Unspecified place in unspecified non-institutional (private) residence as the place of occurrence of the external cause: Secondary | ICD-10-CM | POA: Diagnosis not present

## 2024-02-20 DIAGNOSIS — N179 Acute kidney failure, unspecified: Secondary | ICD-10-CM | POA: Diagnosis present

## 2024-02-20 DIAGNOSIS — I110 Hypertensive heart disease with heart failure: Secondary | ICD-10-CM | POA: Diagnosis present

## 2024-02-20 DIAGNOSIS — I5032 Chronic diastolic (congestive) heart failure: Secondary | ICD-10-CM | POA: Diagnosis not present

## 2024-02-20 DIAGNOSIS — W1830XA Fall on same level, unspecified, initial encounter: Secondary | ICD-10-CM | POA: Diagnosis present

## 2024-02-20 LAB — GLUCOSE, CAPILLARY
Glucose-Capillary: 128 mg/dL — ABNORMAL HIGH (ref 70–99)
Glucose-Capillary: 131 mg/dL — ABNORMAL HIGH (ref 70–99)
Glucose-Capillary: 132 mg/dL — ABNORMAL HIGH (ref 70–99)
Glucose-Capillary: 132 mg/dL — ABNORMAL HIGH (ref 70–99)
Glucose-Capillary: 136 mg/dL — ABNORMAL HIGH (ref 70–99)
Glucose-Capillary: 146 mg/dL — ABNORMAL HIGH (ref 70–99)
Glucose-Capillary: 154 mg/dL — ABNORMAL HIGH (ref 70–99)
Glucose-Capillary: 160 mg/dL — ABNORMAL HIGH (ref 70–99)
Glucose-Capillary: 183 mg/dL — ABNORMAL HIGH (ref 70–99)
Glucose-Capillary: 220 mg/dL — ABNORMAL HIGH (ref 70–99)
Glucose-Capillary: 229 mg/dL — ABNORMAL HIGH (ref 70–99)
Glucose-Capillary: 230 mg/dL — ABNORMAL HIGH (ref 70–99)
Glucose-Capillary: 238 mg/dL — ABNORMAL HIGH (ref 70–99)
Glucose-Capillary: 246 mg/dL — ABNORMAL HIGH (ref 70–99)
Glucose-Capillary: 248 mg/dL — ABNORMAL HIGH (ref 70–99)
Glucose-Capillary: 278 mg/dL — ABNORMAL HIGH (ref 70–99)

## 2024-02-20 LAB — BETA-HYDROXYBUTYRIC ACID
Beta-Hydroxybutyric Acid: >8 mmol/L — ABNORMAL HIGH (ref 0.05–0.27)
Beta-Hydroxybutyric Acid: 6.92 mmol/L — ABNORMAL HIGH (ref 0.05–0.27)
Beta-Hydroxybutyric Acid: 3.09 mmol/L — ABNORMAL HIGH (ref 0.05–0.27)
Beta-Hydroxybutyric Acid: 2.28 mmol/L — ABNORMAL HIGH (ref 0.05–0.27)
Beta-Hydroxybutyric Acid: 1.26 mmol/L — ABNORMAL HIGH (ref 0.05–0.27)
Beta-Hydroxybutyric Acid: 0.78 mmol/L — ABNORMAL HIGH (ref 0.05–0.27)

## 2024-02-20 LAB — ECHOCARDIOGRAM COMPLETE
AR max vel: 2.01 cm2
AV Area VTI: 2.17 cm2
AV Area mean vel: 1.9 cm2
AV Mean grad: 5 mmHg
AV Peak grad: 10.4 mmHg
Ao pk vel: 1.61 m/s
Area-P 1/2: 2.95 cm2
Calc EF: 65.9 %
Height: 59 in
MV VTI: 1.94 cm2
S' Lateral: 3.1 cm
Single Plane A2C EF: 66.2 %
Single Plane A4C EF: 62.9 %
Weight: 2464 [oz_av]

## 2024-02-20 LAB — RESPIRATORY PANEL BY PCR

## 2024-02-20 LAB — BLOOD GAS, VENOUS
Acid-base deficit: 21.4 mmol/L — ABNORMAL HIGH (ref 0.0–2.0)
Acid-base deficit: 16.3 mmol/L — ABNORMAL HIGH (ref 0.0–2.0)
Bicarbonate: 7.1 mmol/L — ABNORMAL LOW (ref 20.0–28.0)
Bicarbonate: 10.9 mmol/L — ABNORMAL LOW (ref 20.0–28.0)
Drawn by: 67894
O2 Saturation: 92.3 %
O2 Saturation: 41.5 %
Patient temperature: 37
Patient temperature: 36.8
pCO2, Ven: 24 mmHg — ABNORMAL LOW (ref 44–60)
pCO2, Ven: 30 mmHg — ABNORMAL LOW (ref 44–60)
pH, Ven: 7.08 — CL (ref 7.25–7.43)
pH, Ven: 7.17 — CL (ref 7.25–7.43)
pO2, Ven: 61 mmHg — ABNORMAL HIGH (ref 32–45)
pO2, Ven: <31 mmHg — CL (ref 32–45)

## 2024-02-20 LAB — BASIC METABOLIC PANEL WITH GFR
Anion gap: 25 — ABNORMAL HIGH (ref 5–15)
Anion gap: 11 (ref 5–15)
Anion gap: 13 (ref 5–15)
Anion gap: 14 (ref 5–15)
BUN: 26 mg/dL — ABNORMAL HIGH (ref 8–23)
BUN: 27 mg/dL — ABNORMAL HIGH (ref 8–23)
BUN: 23 mg/dL (ref 8–23)
BUN: 22 mg/dL (ref 8–23)
BUN: 20 mg/dL (ref 8–23)
CO2: <7 mmol/L — ABNORMAL LOW (ref 22–32)
CO2: 9 mmol/L — ABNORMAL LOW (ref 22–32)
CO2: 15 mmol/L — ABNORMAL LOW (ref 22–32)
CO2: 17 mmol/L — ABNORMAL LOW (ref 22–32)
CO2: 17 mmol/L — ABNORMAL LOW (ref 22–32)
Calcium: 8.4 mg/dL — ABNORMAL LOW (ref 8.9–10.3)
Calcium: 8.6 mg/dL — ABNORMAL LOW (ref 8.9–10.3)
Calcium: 7.9 mg/dL — ABNORMAL LOW (ref 8.9–10.3)
Calcium: 8.1 mg/dL — ABNORMAL LOW (ref 8.9–10.3)
Calcium: 8.2 mg/dL — ABNORMAL LOW (ref 8.9–10.3)
Chloride: 109 mmol/L (ref 98–111)
Chloride: 107 mmol/L (ref 98–111)
Chloride: 115 mmol/L — ABNORMAL HIGH (ref 98–111)
Chloride: 110 mmol/L (ref 98–111)
Chloride: 108 mmol/L (ref 98–111)
Creatinine, Ser: 1.6 mg/dL — ABNORMAL HIGH (ref 0.61–1.24)
Creatinine, Ser: 1.68 mg/dL — ABNORMAL HIGH (ref 0.61–1.24)
Creatinine, Ser: 1.29 mg/dL — ABNORMAL HIGH (ref 0.61–1.24)
Creatinine, Ser: 1.18 mg/dL (ref 0.61–1.24)
Creatinine, Ser: 1.07 mg/dL (ref 0.61–1.24)
GFR, Estimated: 41 mL/min — ABNORMAL LOW (ref >60)
GFR, Estimated: 39 mL/min — ABNORMAL LOW (ref >60)
GFR, Estimated: 53 mL/min — ABNORMAL LOW (ref >60)
GFR, Estimated: 59 mL/min — ABNORMAL LOW (ref >60)
GFR, Estimated: >60 mL/min (ref >60)
Glucose, Bld: 217 mg/dL — ABNORMAL HIGH (ref 70–99)
Glucose, Bld: 243 mg/dL — ABNORMAL HIGH (ref 70–99)
Glucose, Bld: 247 mg/dL — ABNORMAL HIGH (ref 70–99)
Glucose, Bld: 191 mg/dL — ABNORMAL HIGH (ref 70–99)
Glucose, Bld: 142 mg/dL — ABNORMAL HIGH (ref 70–99)
Potassium: 4.6 mmol/L (ref 3.5–5.1)
Potassium: 4.6 mmol/L (ref 3.5–5.1)
Potassium: 3.7 mmol/L (ref 3.5–5.1)
Potassium: 3.8 mmol/L (ref 3.5–5.1)
Potassium: 3.5 mmol/L (ref 3.5–5.1)
Sodium: 140 mmol/L (ref 135–145)
Sodium: 141 mmol/L (ref 135–145)
Sodium: 141 mmol/L (ref 135–145)
Sodium: 140 mmol/L (ref 135–145)
Sodium: 139 mmol/L (ref 135–145)

## 2024-02-20 LAB — TROPONIN I (HIGH SENSITIVITY)
Troponin I (High Sensitivity): 310 ng/L (ref <18)
Troponin I (High Sensitivity): 792 ng/L (ref <18)
Troponin I (High Sensitivity): 1002 ng/L (ref <18)
Troponin I (High Sensitivity): 1119 ng/L (ref <18)

## 2024-02-20 LAB — CBC
HCT: 41 % (ref 39.0–52.0)
Hemoglobin: 12.7 g/dL — ABNORMAL LOW (ref 13.0–17.0)
MCH: 29.6 pg (ref 26.0–34.0)
MCHC: 31 g/dL (ref 30.0–36.0)
MCV: 95.6 fL (ref 80.0–100.0)
Platelets: 192 K/uL (ref 150–400)
RBC: 4.29 MIL/uL (ref 4.22–5.81)
RDW: 14 % (ref 11.5–15.5)
WBC: 20.3 K/uL — ABNORMAL HIGH (ref 4.0–10.5)
nRBC: 0 % (ref 0.0–0.2)

## 2024-02-20 LAB — COMPREHENSIVE METABOLIC PANEL WITH GFR
ALT: 10 U/L (ref 0–44)
AST: 19 U/L (ref 15–41)
Albumin: 3 g/dL — ABNORMAL LOW (ref 3.5–5.0)
Alkaline Phosphatase: 78 U/L (ref 38–126)
Anion gap: 17 — ABNORMAL HIGH (ref 5–15)
BUN: 25 mg/dL — ABNORMAL HIGH (ref 8–23)
CO2: 13 mmol/L — ABNORMAL LOW (ref 22–32)
Calcium: 8.1 mg/dL — ABNORMAL LOW (ref 8.9–10.3)
Chloride: 112 mmol/L — ABNORMAL HIGH (ref 98–111)
Creatinine, Ser: 1.36 mg/dL — ABNORMAL HIGH (ref 0.61–1.24)
GFR, Estimated: 50 mL/min — ABNORMAL LOW (ref >60)
Glucose, Bld: 256 mg/dL — ABNORMAL HIGH (ref 70–99)
Potassium: 4.3 mmol/L (ref 3.5–5.1)
Sodium: 142 mmol/L (ref 135–145)
Total Bilirubin: 1.2 mg/dL (ref 0.0–1.2)
Total Protein: 6.1 g/dL — ABNORMAL LOW (ref 6.5–8.1)

## 2024-02-20 LAB — CBG MONITORING, ED: Glucose-Capillary: 190 mg/dL — ABNORMAL HIGH (ref 70–99)

## 2024-02-20 LAB — PHOSPHORUS: Phosphorus: 4.7 mg/dL — ABNORMAL HIGH (ref 2.5–4.6)

## 2024-02-20 LAB — MAGNESIUM: Magnesium: 1.9 mg/dL (ref 1.7–2.4)

## 2024-02-20 LAB — HEPARIN LEVEL (UNFRACTIONATED): Heparin Unfractionated: <0.1 [IU]/mL — ABNORMAL LOW (ref 0.30–0.70)

## 2024-02-20 LAB — MRSA NEXT GEN BY PCR, NASAL: MRSA by PCR Next Gen: NOT DETECTED

## 2024-02-20 LAB — VITAMIN B12: Vitamin B-12: 1917 pg/mL — ABNORMAL HIGH (ref 180–914)

## 2024-02-20 LAB — HEMOGLOBIN A1C
Hgb A1c MFr Bld: 10.1 % — ABNORMAL HIGH (ref 4.8–5.6)
Mean Plasma Glucose: 243 mg/dL

## 2024-02-20 LAB — AMMONIA: Ammonia: 17 umol/L (ref 9–35)

## 2024-02-20 LAB — LACTIC ACID, PLASMA
Lactic Acid, Venous: 2.1 mmol/L (ref 0.5–1.9)
Lactic Acid, Venous: 2.6 mmol/L (ref 0.5–1.9)
Lactic Acid, Venous: 2.3 mmol/L (ref 0.5–1.9)

## 2024-02-20 LAB — PROCALCITONIN: Procalcitonin: 3.72 ng/mL

## 2024-02-20 LAB — TSH: TSH: 1.125 u[IU]/mL (ref 0.350–4.500)

## 2024-02-20 MED ORDER — HEPARIN BOLUS VIA INFUSION
2000.0000 [IU] | Freq: Once | INTRAVENOUS | Status: AC
  Administered 2024-02-20: 2000 [IU] via INTRAVENOUS
  Filled 2024-02-20: qty 2000

## 2024-02-20 MED ORDER — INSULIN ASPART 100 UNIT/ML IJ SOLN
0.0000 [IU] | Freq: Three times a day (TID) | INTRAMUSCULAR | Status: DC
  Administered 2024-02-20: 2 [IU] via SUBCUTANEOUS

## 2024-02-20 MED ORDER — HEPARIN BOLUS VIA INFUSION
3000.0000 [IU] | Freq: Once | INTRAVENOUS | Status: DC
  Filled 2024-02-20: qty 3000

## 2024-02-20 MED ORDER — SODIUM BICARBONATE 8.4 % IV SOLN
INTRAVENOUS | Status: DC
  Filled 2024-02-20: qty 1000

## 2024-02-20 MED ORDER — TAMSULOSIN HCL 0.4 MG PO CAPS
0.4000 mg | ORAL_CAPSULE | Freq: Every day | ORAL | Status: DC
  Administered 2024-02-20 – 2024-02-29 (×6): 0.4 mg via ORAL
  Filled 2024-02-20 (×7): qty 1

## 2024-02-20 MED ORDER — INSULIN REGULAR(HUMAN) IN NACL 100-0.9 UT/100ML-% IV SOLN
INTRAVENOUS | Status: DC
  Administered 2024-02-20: 9 [IU]/h via INTRAVENOUS
  Administered 2024-02-20: 7 [IU]/h via INTRAVENOUS
  Administered 2024-02-20: 3 [IU]/h via INTRAVENOUS
  Administered 2024-02-20: 6.5 [IU]/h via INTRAVENOUS
  Administered 2024-02-21: 1.1 [IU]/h via INTRAVENOUS
  Filled 2024-02-20: qty 100

## 2024-02-20 MED ORDER — SERTRALINE HCL 50 MG PO TABS
50.0000 mg | ORAL_TABLET | Freq: Every evening | ORAL | Status: DC
  Administered 2024-02-20 – 2024-02-29 (×6): 50 mg via ORAL
  Filled 2024-02-20 (×7): qty 1

## 2024-02-20 MED ORDER — TIMOLOL MALEATE 0.5 % OP SOLN
1.0000 [drp] | Freq: Two times a day (BID) | OPHTHALMIC | Status: DC
  Administered 2024-02-20 – 2024-03-01 (×21): 1 [drp] via OPHTHALMIC
  Filled 2024-02-20 (×2): qty 5

## 2024-02-20 MED ORDER — HALOPERIDOL LACTATE 5 MG/ML IJ SOLN: 1.0000 mg | Freq: Four times a day (QID) | INTRAMUSCULAR | Status: DC | PRN

## 2024-02-20 MED ORDER — SODIUM CHLORIDE 0.9 % IV SOLN
2.0000 g | INTRAVENOUS | Status: DC
  Administered 2024-02-20: 2 g via INTRAVENOUS
  Filled 2024-02-20: qty 12.5

## 2024-02-20 MED ORDER — ASPIRIN 300 MG RE SUPP
300.0000 mg | Freq: Once | RECTAL | Status: AC
  Filled 2024-02-20: qty 1

## 2024-02-20 MED ORDER — FINASTERIDE 5 MG PO TABS
5.0000 mg | ORAL_TABLET | Freq: Every evening | ORAL | Status: DC
  Administered 2024-02-20 – 2024-02-29 (×6): 5 mg via ORAL
  Filled 2024-02-20 (×7): qty 1

## 2024-02-20 MED ORDER — SODIUM CHLORIDE 0.9 % IV SOLN: INTRAVENOUS | Status: AC

## 2024-02-20 MED ORDER — DEXTROSE 50 % IV SOLN: 0.0000 mL | INTRAVENOUS | Status: DC | PRN

## 2024-02-20 MED ORDER — BRIMONIDINE TARTRATE 0.2 % OP SOLN
1.0000 [drp] | Freq: Two times a day (BID) | OPHTHALMIC | Status: DC
  Administered 2024-02-20 – 2024-03-01 (×21): 1 [drp] via OPHTHALMIC
  Filled 2024-02-20: qty 5

## 2024-02-20 MED ORDER — ALBUTEROL SULFATE (2.5 MG/3ML) 0.083% IN NEBU: 2.5000 mg | INHALATION_SOLUTION | RESPIRATORY_TRACT | Status: DC | PRN

## 2024-02-20 MED ORDER — ATORVASTATIN CALCIUM 40 MG PO TABS
40.0000 mg | ORAL_TABLET | Freq: Every evening | ORAL | Status: DC
  Administered 2024-02-20 – 2024-02-29 (×6): 40 mg via ORAL
  Filled 2024-02-20 (×7): qty 1

## 2024-02-20 MED ORDER — HEPARIN (PORCINE) 25000 UT/250ML-% IV SOLN
1200.0000 [IU]/h | INTRAVENOUS | Status: DC
  Administered 2024-02-20: 850 [IU]/h via INTRAVENOUS
  Administered 2024-02-21 – 2024-02-23 (×3): 1200 [IU]/h via INTRAVENOUS
  Filled 2024-02-20 (×4): qty 250

## 2024-02-20 MED ORDER — SODIUM CHLORIDE 0.9% FLUSH
3.0000 mL | Freq: Two times a day (BID) | INTRAVENOUS | Status: DC
  Administered 2024-02-20 – 2024-02-28 (×16): 3 mL via INTRAVENOUS

## 2024-02-20 MED ORDER — ASPIRIN 81 MG PO CHEW
81.0000 mg | CHEWABLE_TABLET | Freq: Every day | ORAL | Status: DC
  Filled 2024-02-20 (×2): qty 1

## 2024-02-20 MED ORDER — DEXTROSE-SODIUM CHLORIDE 5-0.45 % IV SOLN
INTRAVENOUS | Status: AC
Start: 2024-02-20 — End: 2024-02-21

## 2024-02-20 MED ORDER — VANCOMYCIN HCL 750 MG/150ML IV SOLN: 750.0000 mg | INTRAVENOUS | Status: DC

## 2024-02-20 MED ORDER — ASPIRIN 81 MG PO CHEW
324.0000 mg | CHEWABLE_TABLET | Freq: Once | ORAL | Status: AC
  Administered 2024-02-20: 324 mg via ORAL
  Filled 2024-02-20: qty 4

## 2024-02-20 MED ORDER — ENOXAPARIN SODIUM 30 MG/0.3ML IJ SOSY
30.0000 mg | PREFILLED_SYRINGE | INTRAMUSCULAR | Status: DC
  Administered 2024-02-20: 30 mg via SUBCUTANEOUS
  Filled 2024-02-20: qty 0.3

## 2024-02-20 MED ORDER — POTASSIUM CHLORIDE 10 MEQ/100ML IV SOLN
10.0000 meq | INTRAVENOUS | Status: AC
  Administered 2024-02-20 (×2): 10 meq via INTRAVENOUS
  Filled 2024-02-20 (×2): qty 100

## 2024-02-20 NOTE — Evaluation (Signed)
 Physical Therapy Evaluation Patient Details Name: Matthew Calderon MRN: [id number] DOB: December 17, 1932 Today's Date: 02/20/2024  History of Present Illness  89 y.o. male presents to [facility] hospital on 02/19/2024 with recent falls and type II dens fx, brought back in with fever and AMS. PMH includes CAD, CHF, DMII, HTN, HLD, OSA, mood disorder.  Clinical Impression  Pt presents to PT with deficits in strength, power, endurance, gait, balance, cognition. Pt expresses a fear of falling during session and demonstrates generalized weakness and impaired activity tolerance at this time. Pt is oriented to person and place but otherwise appears to have poor memory. PT will follow in an effort to improve mobility quality and to reduce risk for falls. Patient will benefit from continued inpatient follow up therapy, <3 hours/day.        If plan is discharge home, recommend the following: A lot of help with walking and/or transfers;A lot of help with bathing/dressing/bathroom;Assistance with cooking/housework;Assist for transportation;Help with stairs or ramp for entrance;Direct supervision/assist for medications management;Direct supervision/assist for financial management;Supervision due to cognitive status   Can travel by private vehicle   Yes    Equipment Recommendations BSC/3in1  Recommendations for Other Services       Functional Status Assessment Patient has had a recent decline in their functional status and demonstrates the ability to make significant improvements in function in a reasonable and predictable amount of time.     Precautions / Restrictions Precautions Precautions: Fall;Cervical Precaution Booklet Issued: No (verbally reviewed neck precautions) Recall of Precautions/Restrictions: Impaired Required Braces or Orthoses: Cervical Brace Cervical Brace: Hard collar;At all times Restrictions Weight Bearing Restrictions Per Provider Order: No      Mobility  Bed Mobility Overal bed  mobility: Needs Assistance Bed Mobility: Rolling, Sidelying to Sit Rolling: Min assist Sidelying to sit: Min assist, HOB elevated, Used rails            Transfers Overall transfer level: Needs assistance Equipment used: Rolling walker (2 wheels) Transfers: Sit to/from Stand Sit to Stand: Contact guard assist                Ambulation/Gait Ambulation/Gait assistance: Min assist Gait Distance (Feet): 15 Feet Assistive device: Rolling walker (2 wheels) Gait Pattern/deviations: Step-to pattern Gait velocity: reduced Gait velocity interpretation: <1.31 ft/sec, indicative of household ambulator   General Gait Details: slowed step-to gait, reduced foot clearance bilaterally  Stairs            Wheelchair Mobility     Tilt Bed    Modified Rankin (Stroke Patients Only)       Balance Overall balance assessment: Needs assistance Sitting-balance support: No upper extremity supported, Feet supported Sitting balance-Leahy Scale: Fair     Standing balance support: Bilateral upper extremity supported, Reliant on assistive device for balance Standing balance-Leahy Scale: Poor                               Pertinent Vitals/Pain Pain Assessment Pain Assessment: Faces Faces Pain Scale: Hurts little more Pain Location: neck Pain Descriptors / Indicators: Grimacing Pain Intervention(s): Monitored during session    Home Living Family/patient expects to be discharged to:: Skilled nursing facility                 Home Equipment: Grab bars - tub/shower;Hand held shower head;Shower seat - built in;Rollator (4 wheels);Adaptive equipment Additional Comments: pt living at [facility] prior to admission    Prior Function Prior Level  of Function : Independent/Modified Independent             Mobility Comments: using a rollator for mobility ADLs Comments: reports needing assist for ADLs     Extremity/Trunk Assessment   Upper Extremity  Assessment Upper Extremity Assessment: Generalized weakness    Lower Extremity Assessment Lower Extremity Assessment: Generalized weakness    Cervical / Trunk Assessment Cervical / Trunk Assessment: Other exceptions Cervical / Trunk Exceptions: cervical collar due to C2 fx  Communication   Communication Communication: Impaired Factors Affecting Communication: Hearing impaired    Cognition Arousal: Alert Behavior During Therapy: WFL for tasks assessed/performed   PT - Cognitive impairments: No family/caregiver present to determine baseline                       PT - Cognition Comments: pt often repeating similar phrases, reports recognizing this PT from [place] however this PT has never been to [place]. Pt is oriented to person and place. does recall recent fall and expresses a fear of falling Following commands: Intact       Cueing Cueing Techniques: Verbal cues     General Comments General comments (skin integrity, edema, etc.): pt on RA, SpO2 stable in mid-90s with activity    Exercises     Assessment/Plan    PT Assessment Patient needs continued PT services  PT Problem List Decreased strength;Decreased balance;Decreased mobility;Decreased activity tolerance;Decreased cognition;Decreased knowledge of use of DME;Decreased safety awareness;Decreased knowledge of precautions;Pain       PT Treatment Interventions DME instruction;Gait training;Functional mobility training;Therapeutic activities;Therapeutic exercise;Balance training;Neuromuscular re-education;Patient/family education;Cognitive remediation    PT Goals (Current goals can be found in the Care Plan section)  Acute Rehab PT Goals Patient Stated Goal: to reduce risk for falls PT Goal Formulation: With patient Time For Goal Achievement: 03/05/24 Potential to Achieve Goals: Fair    Frequency Min 2X/week     Co-evaluation               AM-PAC PT 6 Clicks Mobility  Outcome Measure  Help needed turning from your back to your side while in a flat bed without using bedrails?: A Little Help needed moving from lying on your back to sitting on the side of a flat bed without using bedrails?: A Little Help needed moving to and from a bed to a chair (including a wheelchair)?: A Little Help needed standing up from a chair using your arms (e.g., wheelchair or bedside chair)?: A Little Help needed to walk in hospital room?: A Lot Help needed climbing 3-5 steps with a railing? : Total 6 Click Score: 15    End of Session Equipment Utilized During Treatment: Gait belt;Cervical collar Activity Tolerance: Patient tolerated treatment well Patient left: in chair;with call bell/phone within reach;with chair alarm set Nurse Communication: Mobility status PT Visit Diagnosis: Other abnormalities of gait and mobility (R26.89);Muscle weakness (generalized) (M62.81);History of falling (Z91.81)    Time: 9166-9141 PT Time Calculation (min) (ACUTE ONLY): 25 min   Charges:   PT Evaluation $PT Eval Low Complexity: 1 Low   PT General Charges $$ ACUTE PT VISIT: 1 Visit         Bernardino JINNY Ruth, PT, DPT Acute Rehabilitation Office [phone]   Bernardino JINNY Ruth 02/20/2024, 9:09 AM

## 2024-02-20 NOTE — Inpatient Diabetes Management (Signed)
 Inpatient Diabetes Program Recommendations  AACE/ADA: New Consensus Statement on Inpatient Glycemic Control (2015)  Target Ranges:  Prepandial:   less than 140 mg/dL      Peak postprandial:   less than 180 mg/dL (1-2 hours)      Critically ill patients:  140 - 180 mg/dL   Lab Results  Component Value Date   GLUCAP 220 (H) 02/20/2024   HGBA1C 8.0 (H) 08/13/2023    Review of Glycemic Control  Latest Reference Range & Units 02/20/24 04:34 02/20/24 08:15  Glucose-Capillary 70 - 99 mg/dL 809 (H) 779 (H)   Diabetes history: DM 2 Outpatient Diabetes medications: Trijardy (Jardiance-tradjenta-metformin ) 25-10-998 mg Daily Current orders for Inpatient glycemic control: IV insulin  Endotool DKA  Inpatient Diabetes Program Recommendations:    Remain on IV insulin  - may consider increase Dextrose  fluids to d10 to support he IV insulin  dose in Euglycemic DKA in order to clear the acidosis. I agree with current diet orders and allow pt to eat if able  Would d/c jardiance portion of home trijardy and continue the tradjenta 5 mg and potentially metformin  but will wait until we see how renal function recovers.  Follow up with PCP  Last A1c 8% in 07/2023 Would not have tight control especially if pt lives alone at ALF.  Thanks, Clotilda Bull RN, MSN, BC-ADM Inpatient Diabetes Coordinator Team Pager [phone] (8a-5p)

## 2024-02-20 NOTE — ED Notes (Signed)
 Pt is currently sundowning. He pulled out his iv, removed his cardiac leads and removed c-collar. Pt redirected and door is open

## 2024-02-20 NOTE — Progress Notes (Signed)
 PHARMACY - ANTICOAGULATION CONSULT NOTE  Pharmacy Consult for IV heparin  Indication: chest pain/ACS  Allergies  Allergen Reactions   Alendronate Sodium     Other reaction(s): diarrhea   Penicillin G     Other reaction(s): Unknown   Penicillins Hives    DID THE REACTION INVOLVE: Swelling of the face/tongue/throat, SOB, or low BP? No Sudden or severe rash/hives, skin peeling, or the inside of the mouth or nose? No Did it require medical treatment? No When did it last happen?childhood allergy       If all above answers are "NO", may proceed with cephalosporin use.    Sulfa Antibiotics Hives   Sulfacetamide Sodium     Other reaction(s): Unknown    Patient Measurements: Height: 4' 11 (149.9 cm) Weight: 69.9 kg (154 lb) (per dicsussion with RN) IBW/kg (Calculated) : 47.7 HEPARIN  DW (KG): 62.7  Vital Signs: Temp: 98.2 F (36.8 C) (08/29 1212) Temp Source: Axillary (08/29 1212) BP: 107/60 (08/29 1212) Pulse Rate: 90 (08/29 1212)  Labs: Recent Labs    02/18/24 1146 02/19/24 2010 02/20/24 0322 02/20/24 0600 02/20/24 0858  HGB 14.1 14.2 12.7*  --   --   HCT 43.0 44.3 41.0  --   --   PLT 163 213 192  --   --   LABPROT  --  17.7*  --   --   --   INR  --  1.4*  --   --   --   CREATININE 1.02 1.59*  --  1.60* 1.68*  TROPONINIHS  --   --  310*  --  792*    Estimated Creatinine Clearance: 23.9 mL/min (A) (by C-G formula based on SCr of 1.68 mg/dL (H)).   Medical History: Past Medical History:  Diagnosis Date   Anemia    takes iron pill   Anxiety    Arthritis    Depression    Diabetes mellitus without complication (HCC)    GERD (gastroesophageal reflux disease)    pepto   H/O hiatal hernia    Pneumonia    4 years ago   PONV (postoperative nausea and vomiting) 05/14/2019   Post-nasal drip    Prostate hyperplasia, benign localized, without urinary obstruction    Seasonal allergies    Sleep apnea    no CPAP    Medications:  Infusions:   sodium chloride       ceFEPime  (MAXIPIME ) IV     dextrose  5 % and 0.45 % NaCl 125 mL/hr at 02/20/24 1027   heparin  850 Units/hr (02/20/24 1213)   insulin  9 Units/hr (02/20/24 1251)   sodium bicarbonate  150 mEq in dextrose  5 % 1,150 mL infusion 100 mL/hr at 02/20/24 0534   [START ON 02/21/2024] vancomycin       Assessment: 89 yo male admitted with falls/AMS, found with elevated troponins and pharmacy asked to start IV heparin .  No known anticoagulants PTA or hx bleeding.  Received SQ lovenox  30 this AM ~ 0930.  Goal of Therapy:  Heparin  level 0.3-0.7 units/ml Monitor platelets by anticoagulation protocol: Yes   Plan:  Start IV heparin  with small bolus of 2000 units x 1 given recent enoxaparin  dose. Then start heparin  drip at 850 units/hr. Check heparin  level 8 hrs after drip starts. Daily heparin  level and CBC.  Harlene Barlow, [employer], BCCP Clinical Pharmacist  02/20/2024 1:09 PM   [facility] pharmacy phone numbers are listed on [email or web address]

## 2024-02-20 NOTE — NC FL2 (Signed)
 [place]  MEDICAID FL2 LEVEL OF CARE FORM     IDENTIFICATION  Patient Name: Matthew Calderon Birthdate: March 09, 1933 Sex: male Admission Date (Current Location): 02/19/2024  [facility] and [place] Number:  Producer, television/film/video and Address:  [facility] [place], [address] [place], [place] [address]      Provider Number: [id number]  Attending Physician Name and Address:  Caleen Burgess BROCKS, MD  Relative Name and Phone Number:  DELORES SEWER AND MARYFRANCES (Daughter)  [phone] (Mobile)    Current Level of Care: Hospital Recommended Level of Care: Skilled Nursing Facility Prior Approval Number:    Date Approved/Denied:   PASRR Number: [id number]  Discharge Plan: SNF    Current Diagnoses: Patient Active Problem List   Diagnosis Date Noted   CAP (community acquired pneumonia) 02/20/2024   Sepsis (HCC) 02/20/2024   Sepsis due to pneumonia (HCC) 08/12/2023   Age-related osteoporosis without current pathological fracture 03/17/2021   Penicillin allergy 03/17/2021   Androgen deficiency 03/17/2021   Disorder of lipid metabolism 03/17/2021   Diverticular disease of colon 03/17/2021   Drowsiness 03/17/2021   Encounter for general adult medical examination with abnormal findings 03/17/2021   Essential hypertension 03/17/2021   Gout 03/17/2021   History of psychiatric disorder 03/17/2021   Hypocalcemia 03/17/2021   Intertrigo 03/17/2021   Iron deficiency anemia 03/17/2021   Macular degeneration 03/17/2021   Peripheral arterial disease (HCC) 03/17/2021   Physical deconditioning 03/17/2021   Scoliosis 03/17/2021   Unequal leg length 03/17/2021   Vitamin B12 deficiency 03/17/2021   Vitamin D  deficiency 03/17/2021   Macular pucker of both eyes 11/07/2020   Retinal edema 11/07/2020   Fall 01/14/2020   Chronic diastolic CHF (congestive heart failure) (HCC) 01/12/2020   Multiple rib fractures 01/11/2020   Dysequilibrium 01/11/2020   Fall at home, initial encounter  01/11/2020   Head trauma, subsequent encounter 01/11/2020   Arthritis 05/14/2019   Enlarged prostate 05/14/2019   History of tobacco abuse 05/14/2019   Interstitial cystitis 05/14/2019   Obesity 05/14/2019   OSA (obstructive sleep apnea) 05/14/2019   PONV (postoperative nausea and vomiting) 05/14/2019   Post-nasal drip 05/14/2019   Seasonal allergies 05/14/2019   Chronic cough 07/21/2018   GERD (gastroesophageal reflux disease) 07/21/2018   NSTEMI (non-ST elevated myocardial infarction) (HCC) 06/20/2018   Prostate hyperplasia, benign localized, without urinary obstruction    Hyperglycemia    Syncope, cardiogenic    Degenerative joint disease (DJD) of hip 01/13/2014   Diabetes (HCC) 01/13/2014   Pseudophakia, both eyes 12/01/2013   Nonexudative age-related macular degeneration 01/16/2013   Primary open angle glaucoma of right eye, severe stage 01/13/2013   Allergic rhinitis 09/17/2010   Anemia, unspecified 09/17/2010   Gastro-esophageal reflux disease with esophagitis 12/27/2009   Other and unspecified hyperlipidemia 12/27/2009   Hypertrophy of prostate without urinary obstruction and other lower urinary tract symptoms (LUTS) 07/11/2009   Insomnia 07/11/2009   Osteoporosis 07/11/2009   Other malaise and fatigue 06/15/2008   Depressive disorder, not elsewhere classified 05/04/2008   Unspecified glaucoma 05/04/2008    Orientation RESPIRATION BLADDER Height & Weight     Self, Place  Normal Continent Weight: 154 lb (69.9 kg) (per dicsussion with RN) Height:  4' 11 (149.9 cm)  BEHAVIORAL SYMPTOMS/MOOD NEUROLOGICAL BOWEL NUTRITION STATUS      Continent Diet (See discharge summary)  AMBULATORY STATUS COMMUNICATION OF NEEDS Skin   Extensive Assist Verbally Normal  Personal Care Assistance Level of Assistance  Bathing, Dressing, Feeding Bathing Assistance: Limited assistance Feeding assistance: Limited assistance Dressing Assistance: Limited  assistance     Functional Limitations Info  Sight, Hearing, Speech Sight Info: Adequate Hearing Info: Adequate Speech Info: Adequate    SPECIAL CARE FACTORS FREQUENCY  PT (By licensed PT), OT (By licensed OT)     PT Frequency: 5x week OT Frequency: 5x week            Contractures Contractures Info: Not present    Additional Factors Info  Code Status, Allergies, Psychotropic, Isolation Precautions, Insulin  Sliding Scale Code Status Info: Full Allergies Info: Alendronate Sodium, Penicillin G, Penicillins, Sulfa Antibiotics, Sulfacetamide Sodium Psychotropic Info: Zoloft  Insulin  Sliding Scale Info: See discharge summary Isolation Precautions Info: Droplet pre     Current Medications (02/20/2024):  This is the current hospital active medication list Current Facility-Administered Medications  Medication Dose Route Frequency Provider Last Rate Last Admin   0.9 %  sodium chloride  infusion   Intravenous Continuous Amin, Ankit C, MD       albuterol  (PROVENTIL ) (2.5 MG/3ML) 0.083% nebulizer solution 2.5 mg  2.5 mg Nebulization Q4H PRN Keturah Carrier, MD       aspirin  chewable tablet 81 mg  81 mg Oral Daily Segars, Carrier, MD       atorvastatin  (LIPITOR) tablet 40 mg  40 mg Oral QPM Segars, Carrier, MD       brimonidine  (ALPHAGAN ) 0.2 % ophthalmic solution 1 drop  1 drop Both Eyes BID Segars, Jonathan, MD   1 drop at 02/20/24 1137   ceFEPIme  (MAXIPIME ) 2 g in sodium chloride  0.9 % 100 mL IVPB  2 g Intravenous Q24H Keturah Carrier, MD       dextrose  5 % and 0.45 % NaCl infusion   Intravenous Continuous Amin, Ankit C, MD 125 mL/hr at 02/20/24 1027 New Bag at 02/20/24 1027   dextrose  50 % solution 0-50 mL  0-50 mL Intravenous PRN Amin, Ankit C, MD       finasteride  (PROSCAR ) tablet 5 mg  5 mg Oral QPM Segars, Carrier, MD       haloperidol  lactate (HALDOL ) injection 1 mg  1 mg Intravenous Q6H PRN Keturah Carrier, MD       heparin  ADULT infusion 100 units/mL (25000 units/250mL)   850 Units/hr Intravenous Continuous Carney, Jessica C, RPH 8.5 mL/hr at 02/20/24 1213 850 Units/hr at 02/20/24 1213   insulin  regular, human (MYXREDLIN ) 100 units/ 100 mL infusion   Intravenous Continuous Amin, Ankit C, MD 6 mL/hr at 02/20/24 1450 6 Units/hr at 02/20/24 1450   sertraline  (ZOLOFT ) tablet 50 mg  50 mg Oral QPM Segars, Carrier, MD       sodium chloride  flush (NS) 0.9 % injection 3 mL  3 mL Intravenous Q12H Segars, Carrier, MD   3 mL at 02/20/24 0950   tamsulosin  (FLOMAX ) capsule 0.4 mg  0.4 mg Oral QHS Segars, Carrier, MD       timolol  (TIMOPTIC ) 0.5 % ophthalmic solution 1 drop  1 drop Both Eyes BID Segars, Jonathan, MD   1 drop at 02/20/24 1137   [START ON 02/21/2024] vancomycin  (VANCOREADY) IVPB 750 mg/150 mL  750 mg Intravenous Q48H Laron Agent, [facility]         Discharge Medications: Please see discharge summary for a list of discharge medications.  Relevant Imaging Results:  Relevant Lab Results:   Additional Information SSN: [id number]  Luise JAYSON Pan, LCSWA

## 2024-02-20 NOTE — Progress Notes (Signed)
 Update:   Notified by RN of elevated HS trop 310 (checked due to abnormal EKG with ST depression inferolaterally), patient denies any chest pain. Suspect this is demand in the setting of underlying infection. Although he does have severe CAD. Will load with aspirin  PO v rectal if unable to take PO, hold on AC at this time. Will trend HS troponin with next lab draw at 8 AM. Also has elevated lactate at 2.1. He has received 30 ml/kg fluid, and on gtt. Suspect elevated due to ongoing infection, will hold on additional rebolus and continue on mivf currently on a bicarb gtt. Trend lactate also with next labs   Dorn Dawson, MD  [facility]

## 2024-02-20 NOTE — Progress Notes (Signed)
 PHARMACY - ANTICOAGULATION CONSULT NOTE  Pharmacy Consult for IV heparin  Indication: chest pain/ACS  Allergies  Allergen Reactions   Alendronate Sodium     Other reaction(s): diarrhea   Penicillin G     Other reaction(s): Unknown   Penicillins Hives    DID THE REACTION INVOLVE: Swelling of the face/tongue/throat, SOB, or low BP? No Sudden or severe rash/hives, skin peeling, or the inside of the mouth or nose? No Did it require medical treatment? No When did it last happen?childhood allergy       If all above answers are "NO", may proceed with cephalosporin use.    Sulfa Antibiotics Hives   Sulfacetamide Sodium     Other reaction(s): Unknown    Patient Measurements: Height: 4' 11 (149.9 cm) Weight: 69.9 kg (154 lb) (per dicsussion with RN) IBW/kg (Calculated) : 47.7 HEPARIN  DW (KG): 62.7  Vital Signs: Temp: 98.2 F (36.8 C) (08/29 1900) Temp Source: Oral (08/29 1900) BP: 126/70 (08/29 1900) Pulse Rate: 88 (08/29 1900)  Labs: Recent Labs     0000 02/18/24 1146 02/19/24 2010 02/20/24 0322 02/20/24 0600 02/20/24 0858 02/20/24 1309 02/20/24 1341 02/20/24 1620 02/20/24 2051  HGB  --  14.1 14.2 12.7*  --   --   --   --   --   --   HCT  --  43.0 44.3 41.0  --   --   --   --   --   --   PLT  --  163 213 192  --   --   --   --   --   --   LABPROT  --   --  17.7*  --   --   --   --   --   --   --   INR  --   --  1.4*  --   --   --   --   --   --   --   HEPARINUNFRC  --   --   --   --   --   --   --   --   --  <0.10*  CREATININE  --  1.02 1.59*  --    < > 1.68* 1.36* 1.29* 1.18 1.07  TROPONINIHS   < >  --   --  310*  --  792* 1,002* 1,119*  --   --    < > = values in this interval not displayed.    Estimated Creatinine Clearance: 37.5 mL/min (by C-G formula based on SCr of 1.07 mg/dL).   Assessment: 89 yo male admitted with falls/AMS, found with elevated troponins and pharmacy asked to start IV heparin .  No known anticoagulants PTA or hx bleeding.  Received SQ  lovenox  30 this AM ~ 0930.  Heparin  level undetectable on infusion at 850 units/hr. No issues with line or bleeding reported per RN.  Goal of Therapy:  Heparin  level 0.3-0.7 units/ml Monitor platelets by anticoagulation protocol: Yes   Plan:  Rebolus heparin  2000 units Increase heparin  drip to 1100 units/hr. F/u 8 hr heparin  level  Vito Ralph, PharmD, BCPS Please see amion for complete clinical pharmacist phone list  02/20/2024 9:34 PM

## 2024-02-20 NOTE — ED Notes (Signed)
 CCMD contacted for pt monitoring. CCMD set up in progress

## 2024-02-20 NOTE — Progress Notes (Signed)
 Pt. Is unable to cough up any sputum at this time. Will pass on to day shift to continue following up

## 2024-02-20 NOTE — Evaluation (Addendum)
 Occupational Therapy Evaluation Patient Details Name: Matthew Calderon MRN: [id number] DOB: 1933-04-08 Today's Date: 02/20/2024   History of Present Illness   89 y.o. male presents to [facility] hospital on 02/19/2024 with recent falls and type II dens fx, brought back in with fever and AMS. PMH includes CAD, CHF, DMII, HTN, HLD, OSA, mood disorder.     Clinical Impressions Patient admitted for above and presents with problem list below, including impaired cognition, generalized weakness, cervical precautions, impaired balance and decreased activity tolerance.  Pt oriented to self and place only, following simple commands but demonstrating decreased problem solving, awareness.  He is able to complete transfers using RW with min assist, Adls with setup to max assist.  Cueing for safety throughout.  Difficult to assess vision but anticipate low vision deficits, from hx of cataracts.Spoke to RN/NT about possible soft call bell to optimize safety as he was having a difficult time locating call assist button. Based on performance today, believe patient will best benefit from continued OT services acutely and after dc at inpatient setting with <3hrs/day to optimize independence, safety with ADLs and mobility.      If plan is discharge home, recommend the following:   A little help with walking and/or transfers;A lot of help with bathing/dressing/bathroom;Assistance with cooking/housework;Direct supervision/assist for medications management;Direct supervision/assist for financial management;Assist for transportation;Help with stairs or ramp for entrance     Functional Status Assessment   Patient has had a recent decline in their functional status and demonstrates the ability to make significant improvements in function in a reasonable and predictable amount of time.     Equipment Recommendations   Other (comment) (defer)     Recommendations for Other Services          Precautions/Restrictions   Precautions Precautions: Fall;Cervical Precaution Booklet Issued: No (verbally reviewed neck precautions) Recall of Precautions/Restrictions: Impaired Required Braces or Orthoses: Cervical Brace Cervical Brace: Hard collar;At all times Restrictions Weight Bearing Restrictions Per Provider Order: No     Mobility Bed Mobility               General bed mobility comments: OOB in recliner    Transfers Overall transfer level: Needs assistance Equipment used: Rolling walker (2 wheels) Transfers: Sit to/from Stand Sit to Stand: Min assist           General transfer comment: mild posterior bias, min assist for balance; cueing for hand placement      Balance Overall balance assessment: Needs assistance Sitting-balance support: No upper extremity supported, Feet supported Sitting balance-Leahy Scale: Fair     Standing balance support: Bilateral upper extremity supported, Reliant on assistive device for balance, Single extremity supported Standing balance-Leahy Scale: Poor Standing balance comment: ADls with 1-2 hand support, preference to bil support                           ADL either performed or assessed with clinical judgement   ADL Overall ADL's : Needs assistance/impaired     Grooming: Set up;Sitting           Upper Body Dressing : Sitting;Moderate assistance   Lower Body Dressing: Sit to/from stand;Maximal assistance   Toilet Transfer: Minimal assistance;Ambulation;Rolling walker (2 wheels) Toilet Transfer Details (indicate cue type and reason): simulated in room         Functional mobility during ADLs: Minimal assistance;Rolling walker (2 wheels);Cueing for safety       Vision Baseline Vision/History: 1 Wears glasses  Ability to See in Adequate Light: 1 Impaired Patient Visual Report: No change from baseline Additional Comments: hx of cataracts, pt with difficulty finding call bell and assist button.   He is having a difficult time describing his vision.  Able to locate # of fingers but with increased time.     Perception         Praxis         Pertinent Vitals/Pain Pain Assessment Pain Assessment: Faces Faces Pain Scale: Hurts little more Pain Location: neck Pain Descriptors / Indicators: Grimacing Pain Intervention(s): Monitored during session, Repositioned, Limited activity within patient's tolerance     Extremity/Trunk Assessment Upper Extremity Assessment Upper Extremity Assessment: Generalized weakness (within cervical precautions)   Lower Extremity Assessment Lower Extremity Assessment: Defer to PT evaluation   Cervical / Trunk Assessment Cervical / Trunk Assessment: Other exceptions Cervical / Trunk Exceptions: cervical collar due to C2 fx   Communication Communication Communication: Impaired Factors Affecting Communication: Hearing impaired   Cognition Arousal: Alert Behavior During Therapy: WFL for tasks assessed/performed Cognition: Cognition impaired   Orientation impairments: Person, Place Awareness: Intellectual awareness impaired Memory impairment (select all impairments): Short-term memory, Working [employer] fast streamer, Conservation officer, historic buildings Attention impairment (select first level of impairment): Sustained attention Executive functioning impairment (select all impairments): Initiation, Sequencing, Reasoning, Problem solving, Organization OT - Cognition Comments: pt oriented to self and place only (city, hospital but no specific hospital).  Follows simple commands with increased time, but decreased attention, probelm solving and awareness to deficits.                 Following commands: Impaired Following commands impaired: Follows one step commands with increased time, Follows multi-step commands inconsistently     Cueing  General Comments   Cueing Techniques: Verbal cues  VSS on RA   Exercises     Shoulder Instructions      Home Living  Family/patient expects to be discharged to:: Assisted living                             Home Equipment: Grab bars - tub/shower;Hand held shower head;Shower seat - built in;Rollator (4 wheels);Adaptive equipment   Additional Comments: pt living at [facility] prior to admission      Prior Functioning/Environment Prior Level of Function : Independent/Modified Independent             Mobility Comments: ambulates with rollator at baseline, multiple recent falls per chart review ADLs Comments: pt reports recently having assist for ADLs, but prior to fall was getting assist from staff with med management, goes to dining hall for meals, and was able to [facility] bathing/dressing/toileting using BSC over toilet.    OT Problem List: Decreased strength;Decreased activity tolerance;Impaired balance (sitting and/or standing);Impaired vision/perception;Decreased cognition;Decreased safety awareness;Decreased knowledge of use of DME or AE;Decreased knowledge of precautions   OT Treatment/Interventions: Self-care/ADL training;Therapeutic exercise;DME and/or AE instruction;Therapeutic activities;Cognitive remediation/compensation;Patient/family education;Balance training;Visual/perceptual remediation/compensation      OT Goals(Current goals can be found in the care plan section)   Acute Rehab OT Goals Patient Stated Goal: get better OT Goal Formulation: With patient Time For Goal Achievement: 03/05/24 Potential to Achieve Goals: Good   OT Frequency:  Min 2X/week    Co-evaluation              AM-PAC OT 6 Clicks Daily Activity     Outcome Measure Help from another person eating meals?: A Little Help from another person taking care  of personal grooming?: A Little Help from another person toileting, which includes using toliet, bedpan, or urinal?: A Lot Help from another person bathing (including washing, rinsing, drying)?: A Lot Help from another person to put on and taking  off regular upper body clothing?: A Lot Help from another person to put on and taking off regular lower body clothing?: A Lot 6 Click Score: 14   End of Session Equipment Utilized During Treatment: Rolling walker (2 wheels) Nurse Communication: Mobility status;Other (comment);Precautions (spoke to NT/RN about soft call bell)  Activity Tolerance: Patient tolerated treatment well Patient left: in chair;with call bell/phone within reach;with chair alarm set  OT Visit Diagnosis: Other abnormalities of gait and mobility (R26.89);Muscle weakness (generalized) (M62.81);History of falling (Z91.81);Other symptoms and signs involving cognitive function                Time: 9140-9078 OT Time Calculation (min): 22 min Charges:  OT General Charges $OT Visit: 1 Visit OT Evaluation $OT Eval Moderate Complexity: 1 Mod  Etta NOVAK, OT Acute Rehabilitation Services Office [phone] Secure Chat Preferred    Etta GORMAN Hope 02/20/2024, 11:03 AM

## 2024-02-20 NOTE — Consult Note (Addendum)
 Cardiology Consultation   Patient ID: QUAY SIMKIN MRN: [id number]; DOB: [birth year]  Admit date: 02/19/2024 Date of Consult: 02/20/2024  PCP:  Caleen Dirks, MD   [facility] Providers Cardiologist:  Lynwood Schilling, MD     Patient Profile: Matthew Calderon is a 89 y.o. male with a hx of heart failure with improved EF, type 2 diabetes, hypertension, hyperlipidemia, OSA, mood disorders, recurrent fall who is being seen 02/20/2024 for the evaluation of elevated troponin at the request of Dr. Caleen.  History of Present Illness: Matthew Calderon is an 89 year old male.  Previously followed by Dr. Claudene but has since been seen by Dr. Schilling.  Patient had previously been admitted in 06/12/2018 - 07/13/2018 with pneumonia and found to have non-STEMI.  Echocardiogram on 06/21/18 showed EF 25-30% with severe hypokinesis of the mid-apical anteroseptal and anterior myocardium.  Per review of cardiology notes, patient and family wanted medical treatment only.  He was started on GDMT.  Repeat echocardiogram in 12/2019 showed EF 55-60%, grade 1 DD, normal RV systolic function, trivial AI, mild dilatation of the aortic root measuring 37 mm.  Patient was seen by Dr. Schilling on 05/10/2021.  Patient was doing well at that time.  Remains on GDMT with carvedilol , Entresto .  Was also on aspirin  and Lipitor.  He has not been seen by cardiology since that time.  Patient had been seen in the ED on 8/27 after ground-level fall which was complicated by a type II dens fracture.  Neurosurgery was consulted and patient was placed in a hard collar with plan to follow-up in 2 weeks.  He was discharged back to his assisted living facility.  Patient presented to the ED on 8/28 from his assisted living facility.  Per facility, patient was taking his clothes off and was not acting like himself.  He had an episode of emesis with EMS.  Initial vital signs in the ED showed that patient was febrile with a temp of 102.2 F.  He  was tachycardic with heart rate 113 bpm, BP 137/82.  CBC showed that patient had elevated WBC to 16.6. Blood cultures were obtained.  Lactic acid 1.8>2.1.  Found to have severe metabolic acidosis with bicarb 10, AG 22.  UA positive for ketones.  Patient was admitted to the internal medicine service for sepsis, multifocal pneumonia, severe metabolic acidosis, suspected DKA.  Troponin was checked was elevated to 310.  Cardiology consulted.  Patient is confused at the time of my evaluation. Denies being in any pain   Past Medical History:  Diagnosis Date   Anemia    takes iron pill   Anxiety    Arthritis    Depression    Diabetes mellitus without complication (HCC)    GERD (gastroesophageal reflux disease)    pepto   H/O hiatal hernia    Pneumonia    4 years ago   PONV (postoperative nausea and vomiting) 05/14/2019   Post-nasal drip    Prostate hyperplasia, benign localized, without urinary obstruction    Seasonal allergies    Sleep apnea    no CPAP    Past Surgical History:  Procedure Laterality Date   BACK SURGERY  1995   discectomy   COLONOSCOPY W/ BIOPSIES AND POLYPECTOMY     EYE SURGERY Bilateral    cataracts   EYE SURGERY Bilateral    glaucoma shunts   JOINT REPLACEMENT Left 1992   knee   JOINT REPLACEMENT Right 1996   knee   JOINT REPLACEMENT Right  2009   hip   JOINT REPLACEMENT Left 2005   hip   KNEE ARTHROSCOPY Left 1981   KNEE ARTHROSCOPY WITH PATELLA RECONSTRUCTION Right 2010   KNEE SURGERY Bilateral 1954   knee surgery and placed in a cast for 6 weeks   KNEE SURGERY  1961   cartilage removed   LEG SURGERY Bilateral 1974, 1975   straighten legs   SHOULDER OPEN ROTATOR CUFF REPAIR Right 2010   TONSILLECTOMY     TOTAL HIP ARTHROPLASTY Right 01/13/2014   Procedure: TOTAL HIP ARTHROPLASTY ANTERIOR APPROACH;  Surgeon: Maude KANDICE Herald, MD;  Location: [facility] OR;  Service: Orthopedics;  Laterality: Right;     Scheduled Meds:  aspirin   81 mg Oral Daily    atorvastatin   40 mg Oral QPM   brimonidine   1 drop Both Eyes BID   enoxaparin  (LOVENOX ) injection  30 mg Subcutaneous Q24H   finasteride   5 mg Oral QPM   sertraline   50 mg Oral QPM   sodium chloride  flush  3 mL Intravenous Q12H   tamsulosin   0.4 mg Oral QHS   timolol   1 drop Both Eyes BID   Continuous Infusions:  sodium chloride      ceFEPime  (MAXIPIME ) IV     dextrose  5 % and 0.45 % NaCl     insulin  7 Units/hr (02/20/24 1019)   potassium chloride  10 mEq (02/20/24 0950)   sodium bicarbonate  150 mEq in dextrose  5 % 1,150 mL infusion 100 mL/hr at 02/20/24 0534   [START ON 02/21/2024] vancomycin      PRN Meds: albuterol , dextrose , haloperidol  lactate  Allergies:    Allergies  Allergen Reactions   Alendronate Sodium     Other reaction(s): diarrhea   Penicillin G     Other reaction(s): Unknown   Penicillins Hives    DID THE REACTION INVOLVE: Swelling of the face/tongue/throat, SOB, or low BP? No Sudden or severe rash/hives, skin peeling, or the inside of the mouth or nose? No Did it require medical treatment? No When did it last happen?childhood allergy       If all above answers are "NO", may proceed with cephalosporin use.    Sulfa Antibiotics Hives   Sulfacetamide Sodium     Other reaction(s): Unknown    Social History:   Social History   Socioeconomic History   Marital status: Married    Spouse name: Not on file   Number of children: Not on file   Years of education: Not on file   Highest education level: Not on file  Occupational History   Not on file  Tobacco Use   Smoking status: Former    Current packs/day: 0.00    Average packs/day: 1 pack/day for 18.0 years (18.0 ttl pk-yrs)    Types: Cigarettes    Start date: 65    Quit date: 36    Years since quitting: 47.6   Smokeless tobacco: Never   Tobacco comments:    stopped 45 years ago  Vaping Use   Vaping status: Never Used  Substance and Sexual Activity   Alcohol use: Yes    Comment: social   Drug  use: No   Sexual activity: Not on file  Other Topics Concern   Not on file  Social History Narrative   Married, retired Sport and exercise psychologist.  Three step children. Four grands.     Social Drivers of Corporate investment banker Strain: Not on file  Food Insecurity: No Food Insecurity (08/12/2023)   Hunger Vital Sign  Worried About Programme researcher, broadcasting/film/video in the Last Year: Never true    Ran Out of Food in the Last Year: Never true  Transportation Needs: No Transportation Needs (08/12/2023)   PRAPARE - Administrator, Civil Service (Medical): No    Lack of Transportation (Non-Medical): No  Physical Activity: Not on file  Stress: Not on file  Social Connections: Unknown (08/12/2023)   Social Connection and Isolation Panel    Frequency of Communication with Friends and Family: More than three times a week    Frequency of Social Gatherings with Friends and Family: More than three times a week    Attends Religious Services: More than 4 times per year    Active Member of Clubs or Organizations: Patient unable to answer    Attends Banker Meetings: Patient unable to answer    Marital Status: Widowed  Intimate Partner Violence: Not At Risk (08/12/2023)   Humiliation, Afraid, Rape, and Kick questionnaire    Fear of Current or Ex-Partner: No    Emotionally Abused: No    Physically Abused: No    Sexually Abused: No    Family History:   Family History  Problem Relation Age of Onset   Arthritis Mother    Arthritis Father      ROS:  Please see the history of present illness.  All other ROS reviewed and negative.     Physical Exam/Data: Vitals:   02/20/24 0645 02/20/24 0654 02/20/24 0700 02/20/24 0755  BP: 113/62  (!) 97/55 124/63  Pulse: 95  93 98  Resp: (!) 21  (!) 21 20  Temp:  98.3 F (36.8 C)  98.5 F (36.9 C)  TempSrc:  Axillary  Axillary  SpO2: 100%  99% 97%  Weight:      Height:        Intake/Output Summary (Last 24 hours) at 02/20/2024 1020 Last data  filed at 02/20/2024 0021 Gross per 24 hour  Intake 2581.12 ml  Output --  Net 2581.12 ml      02/20/2024    3:29 AM 09/04/2023    1:27 PM 08/12/2023    6:52 PM  Last 3 Weights  Weight (lbs) 154 lb 153 lb 153 lb 10.6 oz  Weight (kg) 69.854 kg 69.4 kg 69.7 kg     Body mass index is 31.1 kg/m.  General:  Elderly male, sitting upright in the armchair. Wearing C-collar. No acute distress  HEENT: multiple superficial abrasions noted on face  Cardiac:  normal S1, S2; RRR; no murmur  Lungs:  clear to auscultation bilaterally, no wheezing, rhonchi or rales. Normal WOB on room air   Abd: soft, nontender  Ext: no edema in BLE Musculoskeletal:  No deformities  Skin: warm and dry   EKG:  The EKG was personally reviewed and demonstrates:  EKG in the ED showed sinus tachycardia with heart rate 107 bpm, RBBB and LAFB Telemetry:  Telemetry was personally reviewed and demonstrates:  NSR  Relevant CV Studies: Cardiac Studies & Procedures   ______________________________________________________________________________________________     ECHOCARDIOGRAM  ECHOCARDIOGRAM COMPLETE 01/12/2020  Narrative ECHOCARDIOGRAM REPORT    Patient Name:   BRANTON EINSTEIN Date of Exam: 01/12/2020 Medical Rec #:  [id number]      Height:       60.0 in Accession #:    [id number]     Weight:       158.6 lb Date of Birth:  Jun 24, 1933      BSA:  1.691 m Patient Age:    87 years       BP:           123/67 mmHg Patient Gender: M              HR:           87 bpm. Exam Location:  Inpatient  Procedure: 2D Echo  Indications:    CHF-Acute Systolic I50.21  History:        Patient has prior history of Echocardiogram examinations, most recent 06/21/2018. Previous Myocardial Infarction; Risk Factors:Diabetes.  Sonographer:    Augustin Seals RDCS (AE) Referring Phys: [id number] AVA SWAYZE  IMPRESSIONS   1. Left ventricular ejection fraction, by estimation, is 55 to 60%. The left ventricle has normal  function. Left ventricular endocardial border not optimally defined to evaluate regional wall motion. Left ventricular diastolic parameters are consistent with Grade I diastolic dysfunction (impaired relaxation). 2. Right ventricular systolic function is normal. The right ventricular size is normal. Tricuspid regurgitation signal is inadequate for assessing PA pressure. 3. The mitral valve is normal in structure. No evidence of mitral valve regurgitation. No evidence of mitral stenosis. 4. The aortic valve is tricuspid. Aortic valve regurgitation is trivial. Mild to moderate aortic valve sclerosis/calcification is present, without any evidence of aortic stenosis. 5. Aortic dilatation noted. There is mild dilatation of the aortic root measuring 37 mm. 6. The inferior vena cava is normal in size with greater than 50% respiratory variability, suggesting right atrial pressure of 3 mmHg. 7. Technically difficult study with poor acoustic windows. Overall LV EF is normal but difficult to comment on motion of individual wall segments.  FINDINGS Left Ventricle: Left ventricular ejection fraction, by estimation, is 55 to 60%. The left ventricle has normal function. Left ventricular endocardial border not optimally defined to evaluate regional wall motion. The left ventricular internal cavity size was normal in size. There is no left ventricular hypertrophy. Left ventricular diastolic parameters are consistent with Grade I diastolic dysfunction (impaired relaxation).  Right Ventricle: The right ventricular size is normal. No increase in right ventricular wall thickness. Right ventricular systolic function is normal. Tricuspid regurgitation signal is inadequate for assessing PA pressure.  Left Atrium: Left atrial size was normal in size.  Right Atrium: Right atrial size was normal in size.  Pericardium: There is no evidence of pericardial effusion.  Mitral Valve: The mitral valve is normal in structure.  There is mild calcification of the mitral valve leaflet(s). Mild mitral annular calcification. No evidence of mitral valve regurgitation. No evidence of mitral valve stenosis.  Tricuspid Valve: The tricuspid valve is normal in structure. Tricuspid valve regurgitation is not demonstrated.  Aortic Valve: The aortic valve is tricuspid. Aortic valve regurgitation is trivial. Aortic regurgitation PHT measures 434 msec. Mild to moderate aortic valve sclerosis/calcification is present, without any evidence of aortic stenosis.  Pulmonic Valve: The pulmonic valve was normal in structure. Pulmonic valve regurgitation is not visualized.  Aorta: Aortic dilatation noted. There is mild dilatation of the aortic root measuring 37 mm.  Venous: The inferior vena cava is normal in size with greater than 50% respiratory variability, suggesting right atrial pressure of 3 mmHg.  IAS/Shunts: No atrial level shunt detected by color flow Doppler.   LEFT VENTRICLE PLAX 2D LVIDd:         4.40 cm  Diastology LVIDs:         3.00 cm  LV e' lateral:   7.72 cm/s LV PW:  0.90 cm  LV E/e' lateral: 9.8 LV IVS:        1.00 cm  LV e' medial:    3.59 cm/s LVOT diam:     2.40 cm  LV E/e' medial:  21.0 LV SV:         79 LV SV Index:   47 LVOT Area:     4.52 cm   RIGHT VENTRICLE RV S prime:     17.20 cm/s TAPSE (M-mode): 1.8 cm  LEFT ATRIUM             Index       RIGHT ATRIUM          Index LA diam:        3.30 cm 1.95 cm/m  RA Area:     8.42 cm LA Vol (A2C):   21.4 ml 12.65 ml/m RA Volume:   14.60 ml 8.63 ml/m LA Vol (A4C):   22.9 ml 13.54 ml/m LA Biplane Vol: 24.3 ml 14.37 ml/m AORTIC VALVE LVOT Vmax:   82.50 cm/s LVOT Vmean:  55.500 cm/s LVOT VTI:    0.175 m AI PHT:      434 msec  AORTA Ao Root diam: 3.70 cm Ao Asc diam:  3.30 cm  MITRAL VALVE MV Area (PHT): 2.91 cm     SHUNTS MV Decel Time: 261 msec     Systemic VTI:  0.18 m MV E velocity: 75.50 cm/s   Systemic Diam: 2.40 cm MV A  velocity: 101.00 cm/s MV E/A ratio:  0.75  Ezra Shuck MD Electronically signed by Ezra Shuck MD Signature Date/Time: 01/12/2020/4:00:07 PM    Final          ______________________________________________________________________________________________       Laboratory Data: High Sensitivity Troponin:   Recent Labs  Lab 02/20/24 0322  TROPONINIHS 310*     Chemistry Recent Labs  Lab 02/18/24 1146 02/19/24 2010 02/20/24 0600  NA 141 141 140  K 4.4 4.9 4.6  CL 105 109 109  CO2 19* 10* <7*  GLUCOSE 176* 190* 217*  BUN 28* 26* 26*  CREATININE 1.02 1.59* 1.60*  CALCIUM  9.1 8.9 8.4*  MG  --   --  1.9  GFRNONAA >60 41* 41*  ANIONGAP 17* 22* NOT CALCULATED    Recent Labs  Lab 02/18/24 1146 02/19/24 2010  PROT 6.6 6.9  ALBUMIN 4.0 3.5  AST 13* 17  ALT 12 15  ALKPHOS 113 90  BILITOT 0.7 1.8*   Lipids No results for input(s): CHOL, TRIG, HDL, LABVLDL, LDLCALC, CHOLHDL in the last 168 hours.  Hematology Recent Labs  Lab 02/18/24 1146 02/19/24 2010 02/20/24 0322  WBC 7.9 16.6* 20.3*  RBC 4.87 4.73 4.29  HGB 14.1 14.2 12.7*  HCT 43.0 44.3 41.0  MCV 88.3 93.7 95.6  MCH 29.0 30.0 29.6  MCHC 32.8 32.1 31.0  RDW 13.2 13.7 14.0  PLT 163 213 192   Thyroid  No results for input(s): TSH, FREET4 in the last 168 hours.  BNPNo results for input(s): BNP, PROBNP in the last 168 hours.  DDimer No results for input(s): DDIMER in the last 168 hours.  Radiology/Studies:  CT CHEST ABDOMEN PELVIS W CONTRAST Result Date: 02/19/2024 CLINICAL DATA:  Sepsis Head trauma, recent fall w/ type 2 dens fx, sepsis EXAM: CT CHEST, ABDOMEN, AND PELVIS WITH CONTRAST TECHNIQUE: Multidetector CT imaging of the chest, abdomen and pelvis was performed following the standard protocol during bolus administration of intravenous contrast. RADIATION DOSE REDUCTION: This exam was performed according  to the departmental dose-optimization program which includes automated  exposure control, adjustment of the mA and/or kV according to patient size and/or use of iterative reconstruction technique. CONTRAST:  75mL OMNIPAQUE  IOHEXOL  350 MG/ML SOLN COMPARISON:  None Available. FINDINGS: CHEST: Cardiovascular: No aortic injury. The thoracic aorta is normal in caliber. The heart is normal in size. No significant pericardial effusion. Four-vessel coronary artery calcification. Aortic valve leaflet calcification. Moderate severe atherosclerotic plaque. The main pulmonary artery is normal in caliber. No central pulmonary embolus. Mediastinum/Nodes: No pneumomediastinum. No mediastinal hematoma. The esophagus is unremarkable.  Small hiatal hernia. The thyroid  is unremarkable. The central airways are patent. No mediastinal, hilar, or axillary lymphadenopathy. Lungs/Pleura: Bilateral lower lobe patchy airspace opacities. No pulmonary nodule. No pulmonary mass. No pulmonary contusion or laceration. No pneumatocele formation. No pleural effusion. No pneumothorax. No hemothorax. Musculoskeletal/Chest wall: No chest wall mass. Old healed right rib fracture. No acute rib or sternal fracture. No spinal fracture. Severe left shoulder degenerative changes. ABDOMEN / PELVIS: Hepatobiliary: Not enlarged. No focal lesion. No laceration or subcapsular hematoma. Calcified gallstones within the gallbladder lumen. No gallbladder wall thickening or pericholecystic fluid. No biliary ductal dilatation. Pancreas: Diffusely atrophic. Otherwise normal pancreatic contour. No main pancreatic duct dilatation. Spleen: Not enlarged. No focal lesion. No laceration, subcapsular hematoma, or vascular injury. Splenule noted. Adrenals/Urinary Tract: No nodularity bilaterally. Bilateral kidneys enhance symmetrically. No hydronephrosis. No contusion, laceration, or subcapsular hematoma. No injury to the vascular structures or collecting systems. No hydroureter. The urinary bladder is unremarkable. On delayed imaging, there is no  urothelial wall thickening and there are no filling defects in the opacified portions of the bilateral collecting systems or ureters. Stomach/Bowel: No small or large bowel wall thickening or dilatation. Colonic diverticulosis. The appendix is unremarkable. Vasculature/Lymphatics: No abdominal aorta or iliac aneurysm. No active contrast extravasation or pseudoaneurysm. No abdominal, pelvic, inguinal lymphadenopathy. Reproductive: Prostate is enlarged measuring up to 5 cm. Other: No simple free fluid ascites. No pneumoperitoneum. No hemoperitoneum. No mesenteric hematoma identified. No organized fluid collection. Musculoskeletal: No significant soft tissue hematoma. No acute pelvic fracture. No spinal fracture. Multilevel severe degenerative changes of the spine. Mild retrolisthesis of L5 on S1. Total bilateral hip arthroplasty partially visualized. Other ports and devices: None. IMPRESSION: 1. Bilateral lower lobe patchy airspace opacities. Finding may represent atelectasis with superimposed infection. 2. No acute intrathoracic, intra-abdominal, intrapelvic traumatic injury. 3. No acute fracture or traumatic malalignment of the thoracic or lumbar spine. 4. Aortic Atherosclerosis (ICD10-I70.0) including four-vessel coronary artery and aortic valve leaflet calcifications-correlate for aortic stenosis. Cholelithiasis with no CT evidence of acute cholecystitis. Colonic diverticulosis with no acute diverticulitis. Small hiatal hernia. Prostatomegaly. Electronically Signed   By: Morgane  Naveau M.D.   On: 02/19/2024 23:09   CT Head Wo Contrast Result Date: 02/19/2024 CLINICAL DATA:  Head trauma, minor (Age >= 65y); recent fall w type 2 dens fx EXAM: CT HEAD WITHOUT CONTRAST CT CERVICAL SPINE WITHOUT CONTRAST TECHNIQUE: Multidetector CT imaging of the head and cervical spine was performed following the standard protocol without intravenous contrast. Multiplanar CT image reconstructions of the cervical spine were also  generated. RADIATION DOSE REDUCTION: This exam was performed according to the departmental dose-optimization program which includes automated exposure control, adjustment of the mA and/or kV according to patient size and/or use of iterative reconstruction technique. COMPARISON:  CT head and C-spine 02/18/2024 FINDINGS: CT HEAD FINDINGS Brain: Cerebral ventricle sizes are concordant with the degree of cerebral volume loss. Trace patchy and confluent areas of decreased attenuation  are noted throughout the deep and periventricular white matter of the cerebral hemispheres bilaterally, compatible with chronic microvascular ischemic disease. No evidence of large-territorial acute infarction. No parenchymal hemorrhage. No mass lesion. No extra-axial collection. No mass effect or midline shift. No hydrocephalus. Basilar cisterns are patent. Vascular: No hyperdense vessel. Atherosclerotic calcifications are present within the cavernous internal carotid and vertebral arteries. Skull: No acute fracture or focal lesion. Sinuses/Orbits: Paranasal sinuses and mastoid air cells are clear. Bilateral lens replacement. Otherwise the orbits are unremarkable. Other: None. CT CERVICAL SPINE FINDINGS Alignment: Stable grade 1 anterolisthesis of C5 on C6 and C6 on C7. Skull base and vertebrae: Redemonstration of acute C2 type 2 dens fracture slightly more displaced with a 2 mm distraction. Multilevel severe degenerative changes of the spine with associated moderate to severe osseous neural foraminal stenosis. No severe osseous central canal stenosis. No aggressive appearing focal osseous lesion or focal pathologic process. Soft tissues and spinal canal: No prevertebral fluid or swelling. No visible canal hematoma. Upper chest: Unremarkable. Other: None. IMPRESSION: 1.  No acute intracranial abnormality. 2. Redemonstration of acute C2 type 2 dens fracture slightly more displaced with a 2 mm distraction. Electronically Signed   By: Morgane   Naveau M.D.   On: 02/19/2024 22:59   CT Cervical Spine Wo Contrast Result Date: 02/19/2024 CLINICAL DATA:  Head trauma, minor (Age >= 65y); recent fall w type 2 dens fx EXAM: CT HEAD WITHOUT CONTRAST CT CERVICAL SPINE WITHOUT CONTRAST TECHNIQUE: Multidetector CT imaging of the head and cervical spine was performed following the standard protocol without intravenous contrast. Multiplanar CT image reconstructions of the cervical spine were also generated. RADIATION DOSE REDUCTION: This exam was performed according to the departmental dose-optimization program which includes automated exposure control, adjustment of the mA and/or kV according to patient size and/or use of iterative reconstruction technique. COMPARISON:  CT head and C-spine 02/18/2024 FINDINGS: CT HEAD FINDINGS Brain: Cerebral ventricle sizes are concordant with the degree of cerebral volume loss. Trace patchy and confluent areas of decreased attenuation are noted throughout the deep and periventricular white matter of the cerebral hemispheres bilaterally, compatible with chronic microvascular ischemic disease. No evidence of large-territorial acute infarction. No parenchymal hemorrhage. No mass lesion. No extra-axial collection. No mass effect or midline shift. No hydrocephalus. Basilar cisterns are patent. Vascular: No hyperdense vessel. Atherosclerotic calcifications are present within the cavernous internal carotid and vertebral arteries. Skull: No acute fracture or focal lesion. Sinuses/Orbits: Paranasal sinuses and mastoid air cells are clear. Bilateral lens replacement. Otherwise the orbits are unremarkable. Other: None. CT CERVICAL SPINE FINDINGS Alignment: Stable grade 1 anterolisthesis of C5 on C6 and C6 on C7. Skull base and vertebrae: Redemonstration of acute C2 type 2 dens fracture slightly more displaced with a 2 mm distraction. Multilevel severe degenerative changes of the spine with associated moderate to severe osseous neural  foraminal stenosis. No severe osseous central canal stenosis. No aggressive appearing focal osseous lesion or focal pathologic process. Soft tissues and spinal canal: No prevertebral fluid or swelling. No visible canal hematoma. Upper chest: Unremarkable. Other: None. IMPRESSION: 1.  No acute intracranial abnormality. 2. Redemonstration of acute C2 type 2 dens fracture slightly more displaced with a 2 mm distraction. Electronically Signed   By: Morgane  Naveau M.D.   On: 02/19/2024 22:59   DG Chest Port 1 View Result Date: 02/19/2024 CLINICAL DATA:  Questionable sepsis - evaluate for abnormality EXAM: PORTABLE CHEST 1 VIEW COMPARISON:  Chest x-ray 02/18/2024 FINDINGS: The heart and mediastinal contours are  unchanged. Atherosclerotic plaque. Low lung volumes. No focal consolidation. No pulmonary edema. No pleural effusion. No pneumothorax. No acute osseous abnormality.  Old healed right rib fracture. IMPRESSION: 1. No active disease. 2.  Aortic Atherosclerosis (ICD10-I70.0). Electronically Signed   By: Morgane  Naveau M.D.   On: 02/19/2024 21:21   CT HEAD WO CONTRAST Result Date: 02/18/2024 EXAM: CT HEAD AND CERVICAL SPINE 02/18/2024 10:42:31 AM TECHNIQUE: CT of the head and cervical spine was performed without the administration of intravenous contrast. Multiplanar reformatted images are provided for review. Automated exposure control, iterative reconstruction, and/or weight based adjustment of the mA/kV was utilized to reduce the radiation dose to as low as reasonably achievable. COMPARISON: CT head and cervical spine 04/09/2021. CLINICAL HISTORY: Polytrauma, blunt. unwitnessed fall. Abrasion and brusing to forehead and chin. Pt c/o neck discomfort; no pain. Hx of DM2 CBG 210. No blood thinners. AAOx4. Unknown LOC. Pt uses walker. FINDINGS: CT HEAD BRAIN AND VENTRICLES: No acute intracranial hemorrhage. No mass effect or midline shift. No abnormal extra-axial fluid collection. Gray-white differentiation is  maintained. No hydrocephalus. Cerebral white matter hypodensities, similar to the prior CT and nonspecific but compatible with mild chronic small vessel ischemic disease. 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 Moderate global cerebral atrophy as well as asymmetrically severe volume loss in the right mesial temporal lobe. Calcified atherosclerosis at the skull base. ORBITS: Bilateral cataract extraction. SINUSES AND MASTOIDS: Minimal mucosal thickening in right ethmoid air cells. Clear mastoid air cells. SOFT TISSUES AND SKULL: Small right-sided forehead scalp hematoma. No acute skull fracture. CT CERVICAL SPINE BONES AND ALIGNMENT: Unchanged grade 1 anterolisthesis of C5 on C6 and C6 on C7. Nondisplaced type 2 dens fracture, new from 2022 but of indeterminate age and potentially nonacute as some of the margins may be partially corticated. Intact posterior elements. DEGENERATIVE CHANGES: Advanced diffuse facet arthrosis. Focally advanced disc degeneration at C7-T1 with mild degeneration elsewhere. SOFT TISSUES: No significant prevertebral soft tissue swelling or fluid. IMPRESSION: 1. No acute intracranial abnormality. 2. Nondisplaced type 2 dens fracture, new from 2022 but of indeterminate age. No significant prevertebral soft tissue swelling or fluid. Electronically signed by: Dasie Hamburg MD 02/18/2024 11:11 AM EDT RP Workstation: [id number]   CT CERVICAL SPINE WO CONTRAST Result Date: 02/18/2024 EXAM: CT HEAD AND CERVICAL SPINE 02/18/2024 10:42:31 AM TECHNIQUE: CT of the head and cervical spine was performed without the administration of intravenous contrast. Multiplanar reformatted images are provided for review. Automated exposure control, iterative reconstruction, and/or weight based adjustment of the mA/kV was utilized to reduce the radiation dose to as low as reasonably achievable. COMPARISON: CT head and cervical spine 04/09/2021. CLINICAL HISTORY: Polytrauma, blunt. unwitnessed fall. Abrasion  and brusing to forehead and chin. Pt c/o neck discomfort; no pain. Hx of DM2 CBG 210. No blood thinners. AAOx4. Unknown LOC. Pt uses walker. FINDINGS: CT HEAD BRAIN AND VENTRICLES: No acute intracranial hemorrhage. No mass effect or midline shift. No abnormal extra-axial fluid collection. Gray-white differentiation is maintained. No hydrocephalus. Cerebral white matter hypodensities, similar to the prior CT and nonspecific but compatible with mild chronic small vessel ischemic disease. 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 Moderate global cerebral atrophy as well as asymmetrically severe volume loss in the right mesial temporal lobe. Calcified atherosclerosis at the skull base. ORBITS: Bilateral cataract extraction. SINUSES AND MASTOIDS: Minimal mucosal thickening in right ethmoid air cells. Clear mastoid air cells. SOFT TISSUES AND SKULL: Small right-sided forehead scalp hematoma. No acute skull fracture. CT CERVICAL SPINE BONES AND  ALIGNMENT: Unchanged grade 1 anterolisthesis of C5 on C6 and C6 on C7. Nondisplaced type 2 dens fracture, new from 2022 but of indeterminate age and potentially nonacute as some of the margins may be partially corticated. Intact posterior elements. DEGENERATIVE CHANGES: Advanced diffuse facet arthrosis. Focally advanced disc degeneration at C7-T1 with mild degeneration elsewhere. SOFT TISSUES: No significant prevertebral soft tissue swelling or fluid. IMPRESSION: 1. No acute intracranial abnormality. 2. Nondisplaced type 2 dens fracture, new from 2022 but of indeterminate age. No significant prevertebral soft tissue swelling or fluid. Electronically signed by: Dasie Hamburg MD 02/18/2024 11:11 AM EDT RP Workstation: [id number]   DG Pelvis 1-2 Views Result Date: 02/18/2024 CLINICAL DATA:  [place]lemens. EXAM: PELVIS - 1-2 VIEW COMPARISON:  04/09/2021 FINDINGS: Stable bilateral hip prostheses. No fracture or dislocation. Lower lumbar spine degenerative changes. IMPRESSION: No  fracture or dislocation. Electronically Signed   By: Elspeth Bathe M.D.   On: 02/18/2024 11:11   DG Chest 1 View Result Date: 02/18/2024 CLINICAL DATA:  [place]lemens. EXAM: CHEST  1 VIEW COMPARISON:  08/12/2023 FINDINGS: Again demonstrated is a very poor depth of inspiration. Grossly normal sized heart and clear lungs. No fracture or pneumothorax seen. Mild bilateral shoulder degenerative changes. Lower thoracic spine degenerative changes. IMPRESSION: No acute abnormality. Electronically Signed   By: Elspeth Bathe M.D.   On: 02/18/2024 11:10     Assessment and Plan:  Elevated Troponin - EKG in the ED showed sinus tachycardia with heart rate 107 bpm, RBBB and LAFB - High-sensitivity troponin 310>792. Continue to trend to peak. Agree with IV heparin  for now. If trop trend flattens, will likely be able to discontinue  - Patient admitted with sepsis, multifocal pneumonia, severe metabolic acidosis with possible DKA. - Patient denies chest pain - Suspect demand ischemia. Echocardiogram pending. Very unlikely we will pursue ischemic evaluation at this time, but will follow trop trend and echo results  - Patient does have four-vessel coronary artery calcification on CT chest.  Continue aspirin  81 mg daily and Lipitor 40 mg daily  Chronic HFimpEF  - EF previously 25-30% in 05/2018 in the setting of sepsis.  Treated with GDMT.  Repeat echo in 12/2019 showed EF 55-60%, grade 1 DD, normal RV systolic function - Okay to hold GDMT while patient septic. He is euvolemic on exam.  - Echocardiogram pending this admission   Otherwise per primary  - Sepsis - Multifocal pneumonia - Recent ground-level fall with type II dens fracture - Encephalopathy, suspected be metabolic in setting of underlying infection - Severe metabolic acidosis, suspected DKA - AKI - Isolated elevation in T. bili   Risk Assessment/Risk Scores:     For questions or updates, please contact [facility] Please consult  [email or web address] for contact info under    Signed, Rollo FABIENE Louder, PA-C  02/20/2024 10:20 AM  I have personally seen and examined the patient.  My HPI, Exam, and assessment and plan are below, independent of the NPP above.  Mr. Matthew Calderon is an 89 year old with heart failure, hypertension, diabetes, and hyperlipidemia who presents with altered mental status and fever.  He has a history of heart failure with improved ejection fraction, hypertension, diabetes, and hyperlipidemia. Recently, he experienced an elevated troponin level in the context of sepsis and altered mental status, along with multifocal pneumonia, metabolic acidosis, and diabetic ketoacidosis.  He had a fall resulting in a fracture, for which neurosurgery placed a hard collar on February 18, 2024. He returned to the emergency department after being  found altered, taking off his clothes, and not acting like himself. He also experienced emesis and was found to have a fever of 102F and a gap of 22.  He knows who he is.  He responds that he feels fine.  AOX Person.  Exam notable for  Gen: elderly male    Neck: neck brace Cardiac: No Rubs or Gallops, no Murmur, +2 radial pulses Respiratory: Decreased breath sounds bilaterally GI: Soft, nontender, non-distended  MS: No  edema  Labs notable for troponin 100-> 1000s EKG Sinus Tachy with BIFB Tele: sinus tachycardia DIAGNOSTIC Echocardiogram: No evidence of wall motion abnormalities, full study pending  In assessment and plan:   Type 2 NSTEMI secondary to sepsis Type 2 NSTEMI in the setting of sepsis with sinus tachycardia and bifascicular block. Telemetry shows sinus rhythm to sinus tachycardia. No wall motion abnormalities on echocardiogram. Full study pending. Currently chest pain free and unable to consent for heart catheterization. - Await full echocardiogram study results and likely stop heparin  02/21/24  Heart failure with improved ejection fraction - Heart failure with  previously reduced ejection fraction, now improved.  Sepsis with multifocal pneumonia Sepsis with multifocal pneumonia, fever of 102F, and altered mental status. Requires long-term support for pneumonia. - as per TRH  Diabetic ketoacidosis Diabetic ketoacidosis in the setting of sepsis and altered mental status. Metabolic acidosis with a gap of 22.  Altered mental status Altered mental status in the context of sepsis and diabetic ketoacidosis. Unresponsive during examination but knows his name. Previously found disoriented and undressing inappropriately. - as per TRH  If no events 8/30 will finalize recommendations  Stanly Leavens, MD FASE [facility] Cardiologist [facility]  [facility]  [address] [place], [address] [place], [place] [address] [phone]  3:48 PM

## 2024-02-20 NOTE — Hospital Course (Addendum)
 Brief Narrative:   89 year old with history of CAD, CHF, DM2, HTN, HLD, OSA, disorder, recurrent falls comes to the ED from [facility] came to the ED on 8/27 from a ground-level fall found to have type II dens fracture seen by neurosurgery and placed in hard collar with plans to follow-up outpatient in 2 weeks.  But now brought back to the hospital due to concerns of fever and change in mental status.  COVID/RSV/flu were negative.  CT chest abdomen pelvis shows concerns of bilateral patchy opacity, CT head negative for acute pathology, CT cervical spine is also negative.  Patient initially was in euglycemic DKA secondary Trijardy requiring insulin  drip and was transitioned to subcu but again on 9/1 went back into euglycemic DKA requiring Endo tool.  Assessment & Plan:  Principal Problem:   CAP (community acquired pneumonia) Active Problems:   Sepsis (HCC)    Euglycemic diabetic ketoacidosis Uncontrolled DM 2 secondary to hyperglycemia Severe acidosis and euglycemic DKA suspected secondary to Trijardy.  Required insulin  drip which has now been transitioned to subcu sliding scale and long-acting.  Patient went back into euglycemic DKA on 9/1 requiring DKA protocol/Endo tool.  Although anion gap is closed, beta hydroxy remains elevated.  Will allow extra time this time for DKA protocol to run as there may be some lingering metabolites of Trijardy.  -A1c 10.1.  Discussed with daughter that patient will require insulin  upon discharge.  Diabetic coordinator has been consulted  Dysphagia - Speech and swallow evaluation once his mentation has better.  Currently he is n.p.o.  Sepsis, multifocal pneumonia -Concerns of multifocal pneumonia seen on CT chest - Completed 3 days of azithromycin , completed 7 days of Rocephin  - COVID/flu/RSV negative - Bronchodilators, I-S/flutter valve.  Supportive care   Recent ground-level fall complicated by type II dens fracture Repeat CT C-spine demonstrates  slightly more displaced type II dens with 2 mm distraction. Clinically neurologically intact in all extremities Seen by neurosurgery, Dr. [name].  Hard c-collar adjusted to correct fitting.  Outpatient follow-up.   Encephalopathy, metabolic suspected setting of underlying infection Dementia CT head with no acute intracranial abnormality.  Likely in the setting of infection and acidosis.  B12 normal - TSH and ammonia are normal  Atrial fibrillation with RVR - Likely from undergoing stress briefly requiring amiodarone  drip.  Now transition to Coreg .  Discussed with daughter that we will not use long-term anticoagulation due to risk of fall  Elevated troponin Coronary artery disease -Initially significantly elevated troponins CT showing evidence of 4 vessel disease.  Currently suspect demand ischemia.  Echocardiogram shows preserved EF, grade 3 aortic root plaque.  Completed 48 hours of heparin  drip.  Aspirin  has been changed to Plavix .  Acute kidney injury resolved Baseline creatinine 0.7 - 1, elevated to 1.5 on admission.  Continue IV fluids per DKA protocol   Isolated elevation in T Bili  -Trend LFT   Incidental findings: Aortic atherosclerosis, 4 vessel CAD, aortic valve calcification Cholelithiasis, diverticulosis, small hiatal hernia, prostamegaly  CHF with EF 55% Resume Coreg .  Hold Entresto .  Hypertension Slowly resuming home medications Hyperlipidemia See CAD  OSA  Not on CPAP  Mood disorder  Continue home sertraline   Recurrent falls PT/OT-SNF  DVT prophylaxis: Lovenox .     Code Status: Full Code Family Communication: Daughter up to date during the hospitalization Status is:    Subjective: No complaints.  Pleasantly confused.  Examination:  General exam: Appears calm and comfortable, hard c-collar in place.  Elderly frail Respiratory system: Even  regularly irregular Cardiovascular system: S1 & S2 heard, RRR. No JVD, murmurs, rubs, gallops or clicks. No  pedal edema. Gastrointestinal system: Abdomen is nondistended, soft and nontender. No organomegaly or masses felt. Normal bowel sounds heard. Central nervous system: Alert and oriented to name only. No focal neurological deficits. Extremities: Symmetric 5 x 5 power. Skin: No rashes, lesions or ulcers Psychiatry: Judgement and insight appear poor

## 2024-02-20 NOTE — ED Notes (Signed)
 Floor made aware Pt is being transported.

## 2024-02-20 NOTE — TOC Initial Note (Signed)
 Transition of Care [facility]) - Initial/Assessment Note    Patient Details  Name: Matthew Calderon MRN: [id number] Date of Birth: 09-14-1932  [facility] CM/SW Contact:    Luise JAYSON Pan, LCSWA Phone Number: 02/20/2024, 3:16 PM  Clinical Narrative:  CSW spoke with patients daughter, Ginnie, about PT recs for SNF. Per Ginnie, patient lives at [facility] but was at [facility] prior to admission.   CSW contacted [place] with [facility] to confirm. [place] stated patient can return pending insurance auth.  CSW will continue to follow.              Expected Discharge Plan: Skilled Nursing Facility Barriers to Discharge: Continued Medical Work up, English as a second language teacher   Patient Goals and CMS Choice Patient states their goals for this hospitalization and ongoing recovery are:: To return to [facility]          Expected Discharge Plan and Services In-house Referral: Clinical Social Work     Living arrangements for the past 2 months: Assisted Living Facility                                      Prior Living Arrangements/Services Living arrangements for the past 2 months: Assisted Living Facility Lives with:: Facility Resident Patient language and need for interpreter reviewed:: Yes Do you feel safe going back to the place where you live?: Yes      Need for Family Participation in Patient Care: Yes (Comment) Care giver support system in place?: Yes (comment)   Criminal Activity/Legal Involvement Pertinent to Current Situation/Hospitalization: No - Comment as needed  Activities of Daily Living      Permission Sought/Granted Permission sought to share information with : Family Supports, Oceanographer granted to share information with : Yes, Verbal Permission Granted  Share Information with NAME: BROWN,SALLY  Permission granted to share info w AGENCY: [facility]  Permission granted to share info w Relationship:  daughter  Permission granted to share info w Contact Information: [phone]  Emotional Assessment Appearance:: Appears stated age Attitude/Demeanor/Rapport: Unable to Assess Affect (typically observed): Unable to Assess Orientation: : Oriented to Self, Oriented to Place Alcohol / Substance Use: Not Applicable Psych Involvement: No (comment)  Admission diagnosis:  Healthcare-associated pneumonia [J18.9] CAP (community acquired pneumonia) [J18.9] Sepsis (HCC) [A41.9] Sepsis, due to unspecified organism, unspecified whether acute organ dysfunction present [facility]) [A41.9] Patient Active Problem List   Diagnosis Date Noted   CAP (community acquired pneumonia) 02/20/2024   Sepsis (HCC) 02/20/2024   Sepsis due to pneumonia (HCC) 08/12/2023   Age-related osteoporosis without current pathological fracture 03/17/2021   Penicillin allergy 03/17/2021   Androgen deficiency 03/17/2021   Disorder of lipid metabolism 03/17/2021   Diverticular disease of colon 03/17/2021   Drowsiness 03/17/2021   Encounter for general adult medical examination with abnormal findings 03/17/2021   Essential hypertension 03/17/2021   Gout 03/17/2021   History of psychiatric disorder 03/17/2021   Hypocalcemia 03/17/2021   Intertrigo 03/17/2021   Iron deficiency anemia 03/17/2021   Macular degeneration 03/17/2021   Peripheral arterial disease (HCC) 03/17/2021   Physical deconditioning 03/17/2021   Scoliosis 03/17/2021   Unequal leg length 03/17/2021   Vitamin B12 deficiency 03/17/2021   Vitamin D  deficiency 03/17/2021   Macular pucker of both eyes 11/07/2020   Retinal edema 11/07/2020   Fall 01/14/2020   Chronic diastolic CHF (congestive heart failure) (HCC) 01/12/2020  Multiple rib fractures 01/11/2020   Dysequilibrium 01/11/2020   Fall at home, initial encounter 01/11/2020   Head trauma, subsequent encounter 01/11/2020   Arthritis 05/14/2019   Enlarged prostate 05/14/2019   History of tobacco abuse  05/14/2019   Interstitial cystitis 05/14/2019   Obesity 05/14/2019   OSA (obstructive sleep apnea) 05/14/2019   PONV (postoperative nausea and vomiting) 05/14/2019   Post-nasal drip 05/14/2019   Seasonal allergies 05/14/2019   Chronic cough 07/21/2018   GERD (gastroesophageal reflux disease) 07/21/2018   NSTEMI (non-ST elevated myocardial infarction) (HCC) 06/20/2018   Prostate hyperplasia, benign localized, without urinary obstruction    Hyperglycemia    Syncope, cardiogenic    Degenerative joint disease (DJD) of hip 01/13/2014   Diabetes (HCC) 01/13/2014   Pseudophakia, both eyes 12/01/2013   Nonexudative age-related macular degeneration 01/16/2013   Primary open angle glaucoma of right eye, severe stage 01/13/2013   Allergic rhinitis 09/17/2010   Anemia, unspecified 09/17/2010   Gastro-esophageal reflux disease with esophagitis 12/27/2009   Other and unspecified hyperlipidemia 12/27/2009   Hypertrophy of prostate without urinary obstruction and other lower urinary tract symptoms (LUTS) 07/11/2009   Insomnia 07/11/2009   Osteoporosis 07/11/2009   Other malaise and fatigue 06/15/2008   Depressive disorder, not elsewhere classified 05/04/2008   Unspecified glaucoma 05/04/2008   PCP:  Caleen Dirks, MD Pharmacy:   [facility] - [place], [place] - [address] [place] [address] Phone: [phone] Fax: [phone]  [facility] - [place], [place] - [address] [place] [address] Phone: [phone] Fax: [phone]     Social Drivers of Health (SDOH) Social History: SDOH Screenings   Food Insecurity: No Food Insecurity (08/12/2023)  Housing: Low Risk  (08/12/2023)  Transportation Needs: No Transportation Needs (08/12/2023)  Utilities: Not At Risk (08/12/2023)  Social Connections: Unknown (08/12/2023)  Tobacco Use: Medium Risk (02/19/2024)   SDOH Interventions:     Readmission Risk Interventions     No data to display

## 2024-02-20 NOTE — Evaluation (Signed)
 Clinical/Bedside Swallow Evaluation Patient Details  Name: Matthew FRUTIGER MRN: [id number] Date of Birth: Jan 07, 1933  Today's Date: 02/20/2024 Time: SLP Start Time (ACUTE ONLY): 9062 SLP Stop Time (ACUTE ONLY): 0946 SLP Time Calculation (min) (ACUTE ONLY): 9 min  Past Medical History:  Past Medical History:  Diagnosis Date   Anemia    takes iron pill   Anxiety    Arthritis    Depression    Diabetes mellitus without complication (HCC)    GERD (gastroesophageal reflux disease)    pepto   H/O hiatal hernia    Pneumonia    4 years ago   PONV (postoperative nausea and vomiting) 05/14/2019   Post-nasal drip    Prostate hyperplasia, benign localized, without urinary obstruction    Seasonal allergies    Sleep apnea    no CPAP   Past Surgical History:  Past Surgical History:  Procedure Laterality Date   BACK SURGERY  1995   discectomy   COLONOSCOPY W/ BIOPSIES AND POLYPECTOMY     EYE SURGERY Bilateral    cataracts   EYE SURGERY Bilateral    glaucoma shunts   JOINT REPLACEMENT Left 1992   knee   JOINT REPLACEMENT Right 1996   knee   JOINT REPLACEMENT Right 2009   hip   JOINT REPLACEMENT Left 2005   hip   KNEE ARTHROSCOPY Left 1981   KNEE ARTHROSCOPY WITH PATELLA RECONSTRUCTION Right 2010   KNEE SURGERY Bilateral 1954   knee surgery and placed in a cast for 6 weeks   KNEE SURGERY  1961   cartilage removed   LEG SURGERY Bilateral 1974, 1975   straighten legs   SHOULDER OPEN ROTATOR CUFF REPAIR Right 2010   TONSILLECTOMY     TOTAL HIP ARTHROPLASTY Right 01/13/2014   Procedure: TOTAL HIP ARTHROPLASTY ANTERIOR APPROACH;  Surgeon: Maude KANDICE Herald, MD;  Location: [facility] OR;  Service: Orthopedics;  Laterality: Right;   HPI:  Matthew Calderon is an 89 year old male who presented to the ED from [facility] came to the ED on 8/27 from a ground-level fall found to have type II dens fracture seen by neurosurgery and placed in hard collar with plans to follow-up outpatient in 2 weeks.   But now brought back to the hospital 8/28 due to concerns of fever and change in mental status. CT chest 8/28: Bilateral lower lobe patchy airspace opacities. Finding may represent atelectasis with superimposed infection.  COVID/RSV/influenza negative. Pt reporting nonproductive cough and dysphagia in ED.  Pt with history of CAD, CHF, DM2, HTN, HLD, OSA, disorder, recurrent falls.    Assessment / Plan / Recommendation  Clinical Impression  Pt presents with clinical indicators of pharyngeal dysphagia.  Pt tolerated thin liquid by serial straw sip without any clinical s/s of aspiration.  He exhibited good clearance of puree and solid textures.  With solid there was delayed throat clearing observed.  Pt endorses dysphagia since having to wear hard cervical collar.  Chest imaging reflects possible BLL infection.  Will plan for instrumental for further assessment of pharyngeal swallow function.  Pt may continue current diet at this time.    Recommend regular solids and thin liquid pending results of instrumental swallow evaluation.   SLP Visit Diagnosis: Dysphagia, unspecified (R13.10)    Aspiration Risk  Mild aspiration risk    Diet Recommendation Regular;Thin liquid    Liquid Administration via: Cup;Straw Medication Administration:  (As tolerated) Supervision: Staff to assist with self feeding Compensations: Slow rate;Small sips/bites Postural Changes: Seated upright at  90 degrees    Other  Recommendations Oral Care Recommendations: Oral care BID     Assistance Recommended at Discharge  N/A  Functional Status Assessment  (TBD)  Frequency and Duration  (TBD)          Prognosis Prognosis for improved oropharyngeal function:  (TBD)      Swallow Study   General Date of Onset: 02/19/24 HPI: Matthew Calderon is an 89 year old male who presented to the ED from [facility] came to the ED on 8/27 from a ground-level fall found to have type II dens fracture seen by neurosurgery and placed  in hard collar with plans to follow-up outpatient in 2 weeks.  But now brought back to the hospital 8/28 due to concerns of fever and change in mental status. CT chest 8/28: Bilateral lower lobe patchy airspace opacities. Finding may represent atelectasis with superimposed infection.  COVID/RSV/influenza negative. Pt reporting nonproductive cough and dysphagia in ED.  Pt with history of CAD, CHF, DM2, HTN, HLD, OSA, disorder, recurrent falls. Type of Study: Bedside Swallow Evaluation Previous Swallow Assessment: None Diet Prior to this Study: Regular;Thin liquids (Level 0) Temperature Spikes Noted: No Respiratory Status: Nasal cannula History of Recent Intubation: No Behavior/Cognition: Alert;Cooperative;Pleasant mood Oral Cavity Assessment: Within Functional Limits Oral Care Completed by SLP: No Oral Cavity - Dentition: Adequate natural dentition Vision: Functional for self-feeding Patient Positioning: Upright in chair Baseline Vocal Quality: Normal Volitional Cough:  (Fair) Volitional Swallow: Able to elicit    Oral/Motor/Sensory Function Overall Oral Motor/Sensory Function: Mild impairment Facial ROM: Within Functional Limits Facial Symmetry: Within Functional Limits Facial Strength: Within Functional Limits Lingual ROM: Reduced right;Reduced left Lingual Symmetry: Within Functional Limits Velum: Within Functional Limits Mandible: Impaired (reduce 2/2 hard collar)   Ice Chips Ice chips: Not tested   Thin Liquid Thin Liquid: Within functional limits    Nectar Thick Nectar Thick Liquid: Not tested   Honey Thick Honey Thick Liquid: Not tested   Puree Puree: Within functional limits Presentation: Spoon   Solid     Solid: Impaired Pharyngeal Phase Impairments: Throat Clearing - Delayed      Anette FORBES Grippe, MA, CCC-SLP Acute Rehabilitation Services Office: [phone] 02/20/2024,10:05 AM

## 2024-02-20 NOTE — ED Notes (Signed)
 Notified RN that lab is trying to contact her about this patient

## 2024-02-20 NOTE — Progress Notes (Signed)
 Pharmacy Antibiotic Note  Matthew Calderon is a 89 y.o. male admitted on 02/19/2024 with AMS/fever.  Pharmacy has been consulted for vancomycin  dosing for pneumonia.  -CTa: bilat lower lobe patchy airspace opacities -WBC 16, sCr 1.59 (bl~1), Tmax 102.2 -COVID/flu negative, blood cultures collected -First doses of Abx given  Plan: -Cefepime  2g IV every 24 hours -Vancomycin  1250mg  IV x1 -Vancomycin  750mg  IV every 48 hours (AUC 486, IBW, Vd 0.5, sCr 1.59) -Follow up MRSA PCR -Monitor renal function for dose adjustments  -Follow up signs of clinical improvement, LOT, de-escalation of antibiotics     Temp (24hrs), Avg:100 F (37.8 C), Min:98.4 F (36.9 C), Max:102.2 F (39 C)  Recent Labs  Lab 02/18/24 1146 02/19/24 2010 02/19/24 2057  WBC 7.9 16.6*  --   CREATININE 1.02 1.59*  --   LATICACIDVEN  --   --  1.8    CrCl cannot be calculated (Unknown ideal weight.).    Allergies  Allergen Reactions   Alendronate Sodium     Other reaction(s): diarrhea   Penicillin G     Other reaction(s): Unknown   Penicillins Hives    DID THE REACTION INVOLVE: Swelling of the face/tongue/throat, SOB, or low BP? No Sudden or severe rash/hives, skin peeling, or the inside of the mouth or nose? No Did it require medical treatment? No When did it last happen?childhood allergy       If all above answers are "NO", may proceed with cephalosporin use.    Sulfa Antibiotics Hives   Sulfacetamide Sodium     Other reaction(s): Unknown    Antimicrobials this admission: Aztreonam  x1 Cefepime  8/28 >>  Vancomycin  8/28 >>   Microbiology results: 8/28 BCx:  8/29 MRSA PCR:   Thank you for allowing pharmacy to be a part of this patient's care.  Lynwood Poplar, PharmD, BCPS Clinical Pharmacist 02/20/2024 3:55 AM

## 2024-02-20 NOTE — H&P (Signed)
 History and Physical    Matthew Calderon FMW:[id number] DOB: [birth year] DOA: 02/19/2024  PCP: Caleen Dirks, MD   Patient coming from: Home   Chief Complaint:  Chief Complaint  Patient presents with   Altered Mental Status   Fever    HPI: Hx limited due to mild confusion  Matthew Calderon is a 89 y.o. male, from [facility], with hx of CAD, HFimpEF, diabetes type 2, hypertension, hyperlipidemia, OSA, mood disorder, recurrent falls, with recent ED care on 8/27 after a ground-level fall which was complicated by a type II dens fracture and neurosurgery consulted placed in a hard collar with plan to follow-up in 2 weeks, now brought in from ALF with concern for altered mental status and fever.   He provides limited history recalls his recent fall leading to prior ED visit but does not recall reason for being brought back in today.  Reporting a cough which is nonproductive, some trouble with dysphagia.  No other URI symptoms.  Denies fever, chills.  No chest pain.  Has had recent vomiting.  No abdominal pain.  No other GI symptoms.  Denies any recurrent fall since his recent ED visit.      Review of Systems:  ROS complete and negative except as marked above   Allergies  Allergen Reactions   Alendronate Sodium     Other reaction(s): diarrhea   Penicillin G     Other reaction(s): Unknown   Penicillins Hives    DID THE REACTION INVOLVE: Swelling of the face/tongue/throat, SOB, or low BP? No Sudden or severe rash/hives, skin peeling, or the inside of the mouth or nose? No Did it require medical treatment? No When did it last happen?childhood allergy       If all above answers are "NO", may proceed with cephalosporin use.    Sulfa Antibiotics Hives   Sulfacetamide Sodium     Other reaction(s): Unknown    Prior to Admission medications   Medication Sig Start Date End Date Taking? Authorizing Provider  acetaminophen  (TYLENOL ) 500 MG tablet Take 650 mg by mouth 2 (two) times daily as  needed for mild pain (pain score 1-3) or moderate pain (pain score 4-6). 11/17/13   [provider]  aspirin  81 MG chewable tablet Chew 81 mg by mouth daily. 01/14/20   [provider]  atorvastatin  (LIPITOR) 40 MG tablet Take 1 tablet (40 mg total) by mouth daily at 6 PM. Patient taking differently: Take 40 mg by mouth every evening. 06/26/18   Elgergawy, Brayton RAMAN, MD  bacitracin  ointment Apply 1 Application topically 2 (two) times daily. 02/18/24   Rogelia Jerilynn RAMAN, MD  brimonidine  (ALPHAGAN ) 0.2 % ophthalmic solution Place 1 drop into both eyes 2 (two) times daily. 09/22/17   [provider]  carvedilol  (COREG ) 6.25 MG tablet Take 6.25 mg by mouth 2 (two) times daily with a meal.    [provider]  cetirizine (ZYRTEC) 10 MG tablet Take 10 mg by mouth daily.    [provider]  CHOLECALCIFEROL  PO Take 1,000 mcg by mouth every morning.    [provider]  Empagliflozin-Linaglip-Metform (TRIJARDY XR) 25-10-998 MG TB24 Take 1 tablet by mouth daily.    [provider]  Ferrous Sulfate  Dried 143 (45 Fe) MG TBCR Take 45 mg by mouth daily.    [provider]  finasteride  (PROSCAR ) 5 MG tablet Take 5 mg by mouth every evening.  03/11/19   [provider]  furosemide  (LASIX ) 20 MG tablet  Take 20 mg by mouth daily as needed for fluid or edema.    [provider]  guaiFENesin  (MUCINEX ) 600 MG 12 hr tablet Take 1 tablet (600 mg total) by mouth 2 (two) times daily. 08/15/23   Christobal Guadalajara, MD  mirabegron  ER (MYRBETRIQ ) 50 MG TB24 tablet Take 50 mg by mouth daily.    [provider]  Multiple Vitamins-Minerals (PRESERVISION AREDS 2) CAPS Take 1 capsule by mouth 2 (two) times daily.     [provider]  potassium chloride  (KLOR-CON ) 10 MEQ tablet Take 10 mEq by mouth daily.    [provider]  sacubitril -valsartan  (ENTRESTO ) 24-26 MG Take 1 tablet by mouth 2 (two) times daily.    [provider]   sertraline  (ZOLOFT ) 50 MG tablet Take 50 mg by mouth every evening.    [provider]  SYNJARDY XR 25-1000 MG TB24 Take 1 tablet by mouth daily.  04/22/19   [provider]  tamsulosin  (FLOMAX ) 0.4 MG CAPS capsule Take 0.4 mg by mouth at bedtime.    [provider]  timolol  (TIMOPTIC ) 0.5 % ophthalmic solution Place 1 drop into both eyes 2 (two) times daily. 09/22/17   [provider]    Past Medical History:  Diagnosis Date   Anemia    takes iron pill   Anxiety    Arthritis    Depression    Diabetes mellitus without complication (HCC)    GERD (gastroesophageal reflux disease)    pepto   H/O hiatal hernia    Pneumonia    4 years ago   PONV (postoperative nausea and vomiting) 05/14/2019   Post-nasal drip    Prostate hyperplasia, benign localized, without urinary obstruction    Seasonal allergies    Sleep apnea    no CPAP    Past Surgical History:  Procedure Laterality Date   BACK SURGERY  1995   discectomy   COLONOSCOPY W/ BIOPSIES AND POLYPECTOMY     EYE SURGERY Bilateral    cataracts   EYE SURGERY Bilateral    glaucoma shunts   JOINT REPLACEMENT Left 1992   knee   JOINT REPLACEMENT Right 1996   knee   JOINT REPLACEMENT Right 2009   hip   JOINT REPLACEMENT Left 2005   hip   KNEE ARTHROSCOPY Left 1981   KNEE ARTHROSCOPY WITH PATELLA RECONSTRUCTION Right 2010   KNEE SURGERY Bilateral 1954   knee surgery and placed in a cast for 6 weeks   KNEE SURGERY  1961   cartilage removed   LEG SURGERY Bilateral 1974, 1975   straighten legs   SHOULDER OPEN ROTATOR CUFF REPAIR Right 2010   TONSILLECTOMY     TOTAL HIP ARTHROPLASTY Right 01/13/2014   Procedure: TOTAL HIP ARTHROPLASTY ANTERIOR APPROACH;  Surgeon: Maude KANDICE Herald, MD;  Location: [facility] OR;  Service: Orthopedics;  Laterality: Right;     reports that he quit smoking about 47 years ago. His smoking use included cigarettes. He started smoking about 65 years ago. He has a 18 pack-year  smoking history. He has never used smokeless tobacco. He reports current alcohol use. He reports that he does not use drugs.  Family History  Problem Relation Age of Onset   Arthritis Mother    Arthritis Father      Physical Exam: Vitals:   02/20/24 0145 02/20/24 0200 02/20/24 0258 02/20/24 0300  BP: 116/67 114/67  111/62  Pulse: (!) 109 (!) 109  (!) 108  Resp: (!) 31 18  (!)  26  Temp:   99.3 F (37.4 C)   TempSrc:   Axillary   SpO2: 98% 98%  99%    Gen: Awake, alert, chronically ill-appearing, elderly, frail. HEENT: Abrasion and ecchymosis over the forehead, nose and lips. Neck: He is in a soft cervical collar on my initial evaluation. CV: Regular, normal S1, S2, no murmurs  Resp: Normal WOB, coarse in the bases, but otherwise clear Abd: Flat, normoactive, nontender MSK: Symmetric, no edema  Skin: No rashes or lesions to exposed skin  Neuro: Alert and interactive, oriented to person, hospital (moves count), not to time (2023), not situation.  Motor is 5 of 5 and symmetric, sensation is intact and equal to fine touch. Psych: Mild encephalopathy   Data review:   Labs reviewed, notable for:   Bicarb 10, AG 22, lactate 1.8 T. bili 1.8 Other LFT normal Lactate 1.8 WBC 16, neutrophil predominant UA positive ketone, not consistent with infection  Micro:  Results for orders placed or performed during the hospital encounter of 02/19/24  Resp panel by RT-PCR (RSV, Flu A&B, Covid) Anterior Nasal Swab     Status: None   Collection Time: 02/19/24  8:52 PM   Specimen: Anterior Nasal Swab  Result Value Ref Range Status   SARS Coronavirus 2 by RT PCR NEGATIVE NEGATIVE Final   Influenza A by PCR NEGATIVE NEGATIVE Final   Influenza B by PCR NEGATIVE NEGATIVE Final    Comment: (NOTE) The Xpert Xpress SARS-CoV-2/FLU/RSV plus assay is intended as an aid in the diagnosis of influenza from Nasopharyngeal swab specimens and should not be used as a sole basis for treatment. Nasal  washings and aspirates are unacceptable for Xpert Xpress SARS-CoV-2/FLU/RSV testing.  Fact Sheet for Patients: BloggerCourse.com  Fact Sheet for Healthcare Providers: SeriousBroker.it  This test is not yet approved or cleared by the [place]  FDA and has been authorized for detection and/or diagnosis of SARS-CoV-2 by FDA under an Emergency Use Authorization (EUA). This EUA will remain in effect (meaning this test can be used) for the duration of the COVID-19 declaration under Section 564(b)(1) of the Act, 21 U.S.C. section 360bbb-3(b)(1), unless the authorization is terminated or revoked.     Resp Syncytial Virus by PCR NEGATIVE NEGATIVE Final    Comment: (NOTE) Fact Sheet for Patients: BloggerCourse.com  Fact Sheet for Healthcare Providers: SeriousBroker.it  This test is not yet approved or cleared by the [place]  FDA and has been authorized for detection and/or diagnosis of SARS-CoV-2 by FDA under an Emergency Use Authorization (EUA). This EUA will remain in effect (meaning this test can be used) for the duration of the COVID-19 declaration under Section 564(b)(1) of the Act, 21 U.S.C. section 360bbb-3(b)(1), unless the authorization is terminated or revoked.  Performed at [facility], [address] [place], [place] [address]     Imaging reviewed:  CT CHEST ABDOMEN PELVIS W CONTRAST Result Date: 02/19/2024 CLINICAL DATA:  Sepsis Head trauma, recent fall w/ type 2 dens fx, sepsis EXAM: CT CHEST, ABDOMEN, AND PELVIS WITH CONTRAST TECHNIQUE: Multidetector CT imaging of the chest, abdomen and pelvis was performed following the standard protocol during bolus administration of intravenous contrast. RADIATION DOSE REDUCTION: This exam was performed according to the departmental dose-optimization program which includes automated exposure control, adjustment of the mA  and/or kV according to patient size and/or use of iterative reconstruction technique. CONTRAST:  75mL OMNIPAQUE  IOHEXOL  350 MG/ML SOLN COMPARISON:  None Available. FINDINGS: CHEST: Cardiovascular: No aortic injury. The thoracic aorta  is normal in caliber. The heart is normal in size. No significant pericardial effusion. Four-vessel coronary artery calcification. Aortic valve leaflet calcification. Moderate severe atherosclerotic plaque. The main pulmonary artery is normal in caliber. No central pulmonary embolus. Mediastinum/Nodes: No pneumomediastinum. No mediastinal hematoma. The esophagus is unremarkable.  Small hiatal hernia. The thyroid  is unremarkable. The central airways are patent. No mediastinal, hilar, or axillary lymphadenopathy. Lungs/Pleura: Bilateral lower lobe patchy airspace opacities. No pulmonary nodule. No pulmonary mass. No pulmonary contusion or laceration. No pneumatocele formation. No pleural effusion. No pneumothorax. No hemothorax. Musculoskeletal/Chest wall: No chest wall mass. Old healed right rib fracture. No acute rib or sternal fracture. No spinal fracture. Severe left shoulder degenerative changes. ABDOMEN / PELVIS: Hepatobiliary: Not enlarged. No focal lesion. No laceration or subcapsular hematoma. Calcified gallstones within the gallbladder lumen. No gallbladder wall thickening or pericholecystic fluid. No biliary ductal dilatation. Pancreas: Diffusely atrophic. Otherwise normal pancreatic contour. No main pancreatic duct dilatation. Spleen: Not enlarged. No focal lesion. No laceration, subcapsular hematoma, or vascular injury. Splenule noted. Adrenals/Urinary Tract: No nodularity bilaterally. Bilateral kidneys enhance symmetrically. No hydronephrosis. No contusion, laceration, or subcapsular hematoma. No injury to the vascular structures or collecting systems. No hydroureter. The urinary bladder is unremarkable. On delayed imaging, there is no urothelial wall thickening and there  are no filling defects in the opacified portions of the bilateral collecting systems or ureters. Stomach/Bowel: No small or large bowel wall thickening or dilatation. Colonic diverticulosis. The appendix is unremarkable. Vasculature/Lymphatics: No abdominal aorta or iliac aneurysm. No active contrast extravasation or pseudoaneurysm. No abdominal, pelvic, inguinal lymphadenopathy. Reproductive: Prostate is enlarged measuring up to 5 cm. Other: No simple free fluid ascites. No pneumoperitoneum. No hemoperitoneum. No mesenteric hematoma identified. No organized fluid collection. Musculoskeletal: No significant soft tissue hematoma. No acute pelvic fracture. No spinal fracture. Multilevel severe degenerative changes of the spine. Mild retrolisthesis of L5 on S1. Total bilateral hip arthroplasty partially visualized. Other ports and devices: None. IMPRESSION: 1. Bilateral lower lobe patchy airspace opacities. Finding may represent atelectasis with superimposed infection. 2. No acute intrathoracic, intra-abdominal, intrapelvic traumatic injury. 3. No acute fracture or traumatic malalignment of the thoracic or lumbar spine. 4. Aortic Atherosclerosis (ICD10-I70.0) including four-vessel coronary artery and aortic valve leaflet calcifications-correlate for aortic stenosis. Cholelithiasis with no CT evidence of acute cholecystitis. Colonic diverticulosis with no acute diverticulitis. Small hiatal hernia. Prostatomegaly. Electronically Signed   By: Morgane  Naveau M.D.   On: 02/19/2024 23:09   CT Head Wo Contrast Result Date: 02/19/2024 CLINICAL DATA:  Head trauma, minor (Age >= 65y); recent fall w type 2 dens fx EXAM: CT HEAD WITHOUT CONTRAST CT CERVICAL SPINE WITHOUT CONTRAST TECHNIQUE: Multidetector CT imaging of the head and cervical spine was performed following the standard protocol without intravenous contrast. Multiplanar CT image reconstructions of the cervical spine were also generated. RADIATION DOSE REDUCTION:  This exam was performed according to the departmental dose-optimization program which includes automated exposure control, adjustment of the mA and/or kV according to patient size and/or use of iterative reconstruction technique. COMPARISON:  CT head and C-spine 02/18/2024 FINDINGS: CT HEAD FINDINGS Brain: Cerebral ventricle sizes are concordant with the degree of cerebral volume loss. Trace patchy and confluent areas of decreased attenuation are noted throughout the deep and periventricular white matter of the cerebral hemispheres bilaterally, compatible with chronic microvascular ischemic disease. No evidence of large-territorial acute infarction. No parenchymal hemorrhage. No mass lesion. No extra-axial collection. No mass effect or midline shift. No hydrocephalus. Basilar cisterns are patent. Vascular:  No hyperdense vessel. Atherosclerotic calcifications are present within the cavernous internal carotid and vertebral arteries. Skull: No acute fracture or focal lesion. Sinuses/Orbits: Paranasal sinuses and mastoid air cells are clear. Bilateral lens replacement. Otherwise the orbits are unremarkable. Other: None. CT CERVICAL SPINE FINDINGS Alignment: Stable grade 1 anterolisthesis of C5 on C6 and C6 on C7. Skull base and vertebrae: Redemonstration of acute C2 type 2 dens fracture slightly more displaced with a 2 mm distraction. Multilevel severe degenerative changes of the spine with associated moderate to severe osseous neural foraminal stenosis. No severe osseous central canal stenosis. No aggressive appearing focal osseous lesion or focal pathologic process. Soft tissues and spinal canal: No prevertebral fluid or swelling. No visible canal hematoma. Upper chest: Unremarkable. Other: None. IMPRESSION: 1.  No acute intracranial abnormality. 2. Redemonstration of acute C2 type 2 dens fracture slightly more displaced with a 2 mm distraction. Electronically Signed   By: Morgane  Naveau M.D.   On: 02/19/2024 22:59    CT Cervical Spine Wo Contrast Result Date: 02/19/2024 CLINICAL DATA:  Head trauma, minor (Age >= 65y); recent fall w type 2 dens fx EXAM: CT HEAD WITHOUT CONTRAST CT CERVICAL SPINE WITHOUT CONTRAST TECHNIQUE: Multidetector CT imaging of the head and cervical spine was performed following the standard protocol without intravenous contrast. Multiplanar CT image reconstructions of the cervical spine were also generated. RADIATION DOSE REDUCTION: This exam was performed according to the departmental dose-optimization program which includes automated exposure control, adjustment of the mA and/or kV according to patient size and/or use of iterative reconstruction technique. COMPARISON:  CT head and C-spine 02/18/2024 FINDINGS: CT HEAD FINDINGS Brain: Cerebral ventricle sizes are concordant with the degree of cerebral volume loss. Trace patchy and confluent areas of decreased attenuation are noted throughout the deep and periventricular white matter of the cerebral hemispheres bilaterally, compatible with chronic microvascular ischemic disease. No evidence of large-territorial acute infarction. No parenchymal hemorrhage. No mass lesion. No extra-axial collection. No mass effect or midline shift. No hydrocephalus. Basilar cisterns are patent. Vascular: No hyperdense vessel. Atherosclerotic calcifications are present within the cavernous internal carotid and vertebral arteries. Skull: No acute fracture or focal lesion. Sinuses/Orbits: Paranasal sinuses and mastoid air cells are clear. Bilateral lens replacement. Otherwise the orbits are unremarkable. Other: None. CT CERVICAL SPINE FINDINGS Alignment: Stable grade 1 anterolisthesis of C5 on C6 and C6 on C7. Skull base and vertebrae: Redemonstration of acute C2 type 2 dens fracture slightly more displaced with a 2 mm distraction. Multilevel severe degenerative changes of the spine with associated moderate to severe osseous neural foraminal stenosis. No severe osseous  central canal stenosis. No aggressive appearing focal osseous lesion or focal pathologic process. Soft tissues and spinal canal: No prevertebral fluid or swelling. No visible canal hematoma. Upper chest: Unremarkable. Other: None. IMPRESSION: 1.  No acute intracranial abnormality. 2. Redemonstration of acute C2 type 2 dens fracture slightly more displaced with a 2 mm distraction. Electronically Signed   By: Morgane  Naveau M.D.   On: 02/19/2024 22:59   DG Chest Port 1 View Result Date: 02/19/2024 CLINICAL DATA:  Questionable sepsis - evaluate for abnormality EXAM: PORTABLE CHEST 1 VIEW COMPARISON:  Chest x-ray 02/18/2024 FINDINGS: The heart and mediastinal contours are unchanged. Atherosclerotic plaque. Low lung volumes. No focal consolidation. No pulmonary edema. No pleural effusion. No pneumothorax. No acute osseous abnormality.  Old healed right rib fracture. IMPRESSION: 1. No active disease. 2.  Aortic Atherosclerosis (ICD10-I70.0). Electronically Signed   By: Morgane  Naveau M.D.  On: 02/19/2024 21:21   CT HEAD WO CONTRAST Result Date: 02/18/2024 EXAM: CT HEAD AND CERVICAL SPINE 02/18/2024 10:42:31 AM TECHNIQUE: CT of the head and cervical spine was performed without the administration of intravenous contrast. Multiplanar reformatted images are provided for review. Automated exposure control, iterative reconstruction, and/or weight based adjustment of the mA/kV was utilized to reduce the radiation dose to as low as reasonably achievable. COMPARISON: CT head and cervical spine 04/09/2021. CLINICAL HISTORY: Polytrauma, blunt. unwitnessed fall. Abrasion and brusing to forehead and chin. Pt c/o neck discomfort; no pain. Hx of DM2 CBG 210. No blood thinners. AAOx4. Unknown LOC. Pt uses walker. FINDINGS: CT HEAD BRAIN AND VENTRICLES: No acute intracranial hemorrhage. No mass effect or midline shift. No abnormal extra-axial fluid collection. Gray-white differentiation is maintained. No hydrocephalus. Cerebral  white matter hypodensities, similar to the prior CT and nonspecific but compatible with mild chronic small vessel ischemic disease. 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 Moderate global cerebral atrophy as well as asymmetrically severe volume loss in the right mesial temporal lobe. Calcified atherosclerosis at the skull base. ORBITS: Bilateral cataract extraction. SINUSES AND MASTOIDS: Minimal mucosal thickening in right ethmoid air cells. Clear mastoid air cells. SOFT TISSUES AND SKULL: Small right-sided forehead scalp hematoma. No acute skull fracture. CT CERVICAL SPINE BONES AND ALIGNMENT: Unchanged grade 1 anterolisthesis of C5 on C6 and C6 on C7. Nondisplaced type 2 dens fracture, new from 2022 but of indeterminate age and potentially nonacute as some of the margins may be partially corticated. Intact posterior elements. DEGENERATIVE CHANGES: Advanced diffuse facet arthrosis. Focally advanced disc degeneration at C7-T1 with mild degeneration elsewhere. SOFT TISSUES: No significant prevertebral soft tissue swelling or fluid. IMPRESSION: 1. No acute intracranial abnormality. 2. Nondisplaced type 2 dens fracture, new from 2022 but of indeterminate age. No significant prevertebral soft tissue swelling or fluid. Electronically signed by: Dasie Hamburg MD 02/18/2024 11:11 AM EDT RP Workstation: [id number]   CT CERVICAL SPINE WO CONTRAST Result Date: 02/18/2024 EXAM: CT HEAD AND CERVICAL SPINE 02/18/2024 10:42:31 AM TECHNIQUE: CT of the head and cervical spine was performed without the administration of intravenous contrast. Multiplanar reformatted images are provided for review. Automated exposure control, iterative reconstruction, and/or weight based adjustment of the mA/kV was utilized to reduce the radiation dose to as low as reasonably achievable. COMPARISON: CT head and cervical spine 04/09/2021. CLINICAL HISTORY: Polytrauma, blunt. unwitnessed fall. Abrasion and brusing to forehead and chin. Pt  c/o neck discomfort; no pain. Hx of DM2 CBG 210. No blood thinners. AAOx4. Unknown LOC. Pt uses walker. FINDINGS: CT HEAD BRAIN AND VENTRICLES: No acute intracranial hemorrhage. No mass effect or midline shift. No abnormal extra-axial fluid collection. Gray-white differentiation is maintained. No hydrocephalus. Cerebral white matter hypodensities, similar to the prior CT and nonspecific but compatible with mild chronic small vessel ischemic disease. 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 Moderate global cerebral atrophy as well as asymmetrically severe volume loss in the right mesial temporal lobe. Calcified atherosclerosis at the skull base. ORBITS: Bilateral cataract extraction. SINUSES AND MASTOIDS: Minimal mucosal thickening in right ethmoid air cells. Clear mastoid air cells. SOFT TISSUES AND SKULL: Small right-sided forehead scalp hematoma. No acute skull fracture. CT CERVICAL SPINE BONES AND ALIGNMENT: Unchanged grade 1 anterolisthesis of C5 on C6 and C6 on C7. Nondisplaced type 2 dens fracture, new from 2022 but of indeterminate age and potentially nonacute as some of the margins may be partially corticated. Intact posterior elements. DEGENERATIVE CHANGES: Advanced diffuse facet arthrosis. Focally advanced  disc degeneration at C7-T1 with mild degeneration elsewhere. SOFT TISSUES: No significant prevertebral soft tissue swelling or fluid. IMPRESSION: 1. No acute intracranial abnormality. 2. Nondisplaced type 2 dens fracture, new from 2022 but of indeterminate age. No significant prevertebral soft tissue swelling or fluid. Electronically signed by: Dasie Hamburg MD 02/18/2024 11:11 AM EDT RP Workstation: [id number]   DG Pelvis 1-2 Views Result Date: 02/18/2024 CLINICAL DATA:  [place]. EXAM: PELVIS - 1-2 VIEW COMPARISON:  04/09/2021 FINDINGS: Stable bilateral hip prostheses. No fracture or dislocation. Lower lumbar spine degenerative changes. IMPRESSION: No fracture or dislocation. Electronically  Signed   By: Elspeth Bathe M.D.   On: 02/18/2024 11:11   DG Chest 1 View Result Date: 02/18/2024 CLINICAL DATA:  [place]. EXAM: CHEST  1 VIEW COMPARISON:  08/12/2023 FINDINGS: Again demonstrated is a very poor depth of inspiration. Grossly normal sized heart and clear lungs. No fracture or pneumothorax seen. Mild bilateral shoulder degenerative changes. Lower thoracic spine degenerative changes. IMPRESSION: No acute abnormality. Electronically Signed   By: Elspeth Bathe M.D.   On: 02/18/2024 11:10    EKG: Personally reviewed, sinus tachycardia 107, LAD, RBBB, ST depression inferolaterally  ED Course:  Treated with 2.25 L IV fluid and started on rate 150 cc an hour, treated with vancomycin , cefepime  and aztreonam ., tylenol     Assessment/Plan:  89 y.o. male with hx [facility], with hx of CAD, HFimpEF, diabetes type 2, hypertension, hyperlipidemia, OSA, mood disorder, recurrent falls, with recent ED care on 8/27 after a ground-level fall which was complicated by a type II dens fracture and neurosurgery consulted placed in a hard collar with plan to follow-up in 2 weeks, now brought in from ALF with concern for altered mental status and fever. Found to have sepsis with source suspected pneumonia.   Sepsis, source suspected pneumonia, present on admission ongoing.  Hypotension, distributive/hypovolemic, resolved  Initial evaluation febrile to 39C, tachycardic in the 110s, tachypneic in the mid 20s to low 30s, did develop transient hypotension to 80s over 30s although blood pressure has improved. WBC 16, lactate 1.8. CT C/A/P notable for bilateral lower lobe patchy opacities suspicious for pneumonia in this clinical setting. - Continue on vancomycin , Cefepime  for now with severe sepsis; likely can deescalate to CAP/ aspiration PNA coverage if Cx neg / stop vanc if MRSA nares neg. OK to stop aztreonam   - S/p 2.25 L IVF, continue mIVF at 100 cc /hr. repeat lactate - Follow-up blood cultures.  Check RVP,  sputum culture, MRSA nares  - SLP evaluation suspect risk of aspiration  Recent ground-level fall complicated by type II dens fracture Repeat CT C-spine demonstrates slightly more displaced type II dens with 2 mm distraction. Clinically neurologically intact in all extremities - Maintain in hard cervical collar - Can discuss change in CT with neurosurgery in a.m.  Encephalopathy, metabolic suspected setting of underlying infection CT head with no acute intracranial abnormality. -Management of underlying infection per above -Check VBG, B12, TSH   AGMA -> severe metabolic acidosis Bicarb 10, AG 22.  UA positive ketone.  Glucose 190. Doubt diabetic related although is on SGLT2 may consider euglycemic DKA if not improving; suspect may have starvation ketoacidosis.  -Check VBG, beta hydroxybutyrate; if significant acidosis on VBG will need bicarb --> update VBG with severe metabolic acidosis, pH 7.08/24, last bicarb was 10 on BMP.  Will start on bicarb drip 150 mEq in D5W initiated at 100 cc an hour; adjust pending trend  - Repeat VBG in approximately 4 hours ordered  for 8 AM, BMP every 4 hour, beta-hydroxybutyrate every 4 hour; if no improvement with dextrose  containing fluids would start on insulin  plus dextrose  for possible euglycemic DKA  Acute kidney injury stage I  Baseline creatinine 0.7 - 1, elevated to 1.5 on admission. Likely prerenal in setting of sepsis.  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  Home mirabegron   There is no height or weight on file to calculate BMI.    DVT prophylaxis:  Lovenox  Code Status:  Full Code Diet:  Diet Orders (From admission, onward)     Start     Ordered   02/19/24 2046  Diet NPO time specified  (Septic presentation on arrival (screening labs, nursing and treatment orders for obvious sepsis))  Diet effective now        02/19/24 2046           Family Communication:  None   Consults:  None   Admission status:   Inpatient, Telemetry bed  Severity of Illness: The appropriate patient status for this patient is INPATIENT. Inpatient status is judged to be reasonable and necessary in order to provide the required intensity of service to ensure the patient's safety. The patient's presenting symptoms, physical exam findings, and initial radiographic and laboratory data in the context of their chronic comorbidities is felt to place them at high risk for further clinical deterioration. Furthermore, it is not anticipated that the patient will be medically stable for discharge from the hospital within 2 midnights of admission.   * I certify that at the point of admission it is my clinical judgment that the patient will require inpatient hospital care spanning beyond 2 midnights from the point of admission due to high intensity of service, high risk for further deterioration and high frequency of surveillance required.*   Dorn Dawson, MD [facility]  How to contact the [facility] Attending or Consulting provider 7A - 7P or covering provider during after hours 7P -7A, for this patient.  Check the care team in [facility] and look for a) attending/consulting [facility] provider listed and b) the [facility] team listed Log into [email or web address] and use [place]'s universal password to access. If you do not have the password,  please contact the hospital operator. Locate the [facility] provider you are looking for under [facility] and page to a number that you can be directly reached. If you still have difficulty reaching the provider, please page the [facility] (Director on Call) for the Hospitalists listed on amion for assistance.  02/20/2024, 3:03 AM

## 2024-02-20 NOTE — Progress Notes (Addendum)
 PROGRESS NOTE    Matthew Calderon  FMW:[id number] DOB: 1932-09-19 DOA: 02/19/2024 PCP: Caleen Dirks, MD    Brief Narrative:   89 year old with history of CAD, CHF, DM2, HTN, HLD, OSA, disorder, recurrent falls comes to the ED from [facility] came to the ED on 8/27 from a ground-level fall found to have type II dens fracture seen by neurosurgery and placed in hard collar with plans to follow-up outpatient in 2 weeks.  But now brought back to the hospital due to concerns of fever and change in mental status.  COVID/RSV/flu were negative.  CT chest abdomen pelvis shows concerns of bilateral patchy opacity, CT head negative for acute pathology, CT cervical spine is also negative.  Assessment & Plan:  Principal Problem:   CAP (community acquired pneumonia) Active Problems:   Sepsis (HCC)     Sepsis, multifocal pneumonia -Concerns of multifocal pneumonia seen on CT chest - Broad-spectrum antibiotics - COVID/flu/RSV negative - Bronchodilators, I-S/flutter valve.  Supportive care   Recent ground-level fall complicated by type II dens fracture Repeat CT C-spine demonstrates slightly more displaced type II dens with 2 mm distraction. Clinically neurologically intact in all extremities Maintain hard collar all the time - Follow-up outpatient neurosurgery   Encephalopathy, metabolic suspected setting of underlying infection CT head with no acute intracranial abnormality.  Likely in the setting of infection.  B12 normal - TSH and ammonia are normal   AGMA -> severe metabolic acidosis Suspect DKA Hx of DM2 Currently in severe acidosis.  I suspect this could be related to his home [place].  Will currently hold this as I suspect this could be related to euglycemic DKA with elevated beta hydroxybutyrate.  Ordered repeat stat labs -Will start on insulin  drip.  Home meds on hold.   Elevated troponin Coronary artery disease - Denies any chest pain, rising troponin.  CT showing evidence of 4  vessel disease.  Currently suspect demand ischemia.  Cardiology consulted. Heparin  drip and Echo ordered.  -Aspirin  and statin  Acute kidney injury stage I  Baseline creatinine 0.7 - 1, elevated to 1.5 on admission.  Continue IV fluids   Isolated elevation in T Bili  -Trend LFT   Incidental findings: Aortic atherosclerosis, 4 vessel CAD, aortic valve calcification Cholelithiasis, diverticulosis, small hiatal hernia, prostamegaly   CHF with EF 55% Hold home Entresto , carvedilol , SGLT2i   Hypertension Hold home antihypertensives in the setting of sepsis  Hyperlipidemia See CAD  OSA  Not on CPAP  Mood disorder  Continue home sertraline   Recurrent falls Noted, PT OT evaluation  DVT prophylaxis: Lovenox .     Code Status: Full Code Family Communication:   Status is: Inpatient continue hospital stay for management of multiple medical issues as mentioned above   Subjective: Seen at bedside.  Sitting up in the chair.  Pleasantly confused.  Denying any complaints at this time.   Examination:  General exam: Appears calm and comfortable  Respiratory system: Clear to auscultation. Respiratory effort normal. Cardiovascular system: S1 & S2 heard, RRR. No JVD, murmurs, rubs, gallops or clicks. No pedal edema. Gastrointestinal system: Abdomen is nondistended, soft and nontender. No organomegaly or masses felt. Normal bowel sounds heard. Central nervous system: Alert and oriented. No focal neurological deficits. Extremities: Symmetric 5 x 5 power. Skin: No rashes, lesions or ulcers Psychiatry: Judgement and insight appear normal. Mood & affect appropriate.                Diet Orders (From admission, onward)  Start     Ordered   02/20/24 0857  Diet Carb Modified Fluid consistency: Thin; Room service appropriate? Yes  (Diabetes Ketoacidosis (DKA))  Diet effective now       Question Answer Comment  Diet-HS Snack? Nothing   Calorie Level Medium 1600-2000   Fluid  consistency: Thin   Room service appropriate? Yes      02/20/24 0858            Objective: Vitals:   02/20/24 0645 02/20/24 0654 02/20/24 0700 02/20/24 0755  BP: 113/62  (!) 97/55 124/63  Pulse: 95  93 98  Resp: (!) 21  (!) 21 20  Temp:  98.3 F (36.8 C)  98.5 F (36.9 C)  TempSrc:  Axillary  Axillary  SpO2: 100%  99% 97%  Weight:      Height:        Intake/Output Summary (Last 24 hours) at 02/20/2024 1053 Last data filed at 02/20/2024 0021 Gross per 24 hour  Intake 2581.12 ml  Output --  Net 2581.12 ml   Filed Weights   02/20/24 0329  Weight: 69.9 kg    Scheduled Meds:  aspirin   81 mg Oral Daily   atorvastatin   40 mg Oral QPM   brimonidine   1 drop Both Eyes BID   enoxaparin  (LOVENOX ) injection  30 mg Subcutaneous Q24H   finasteride   5 mg Oral QPM   sertraline   50 mg Oral QPM   sodium chloride  flush  3 mL Intravenous Q12H   tamsulosin   0.4 mg Oral QHS   timolol   1 drop Both Eyes BID   Continuous Infusions:  sodium chloride      ceFEPime  (MAXIPIME ) IV     dextrose  5 % and 0.45 % NaCl 125 mL/hr at 02/20/24 1027   insulin  7 Units/hr (02/20/24 1019)   potassium chloride  10 mEq (02/20/24 0950)   sodium bicarbonate  150 mEq in dextrose  5 % 1,150 mL infusion 100 mL/hr at 02/20/24 0534   [START ON 02/21/2024] vancomycin       Nutritional status     Body mass index is 31.1 kg/m.  Data Reviewed:   CBC: Recent Labs  Lab 02/18/24 1146 02/19/24 2010 02/20/24 0322  WBC 7.9 16.6* 20.3*  NEUTROABS 5.5 14.3*  --   HGB 14.1 14.2 12.7*  HCT 43.0 44.3 41.0  MCV 88.3 93.7 95.6  PLT 163 213 192   Basic Metabolic Panel: Recent Labs  Lab 02/18/24 1146 02/19/24 2010 02/20/24 0600 02/20/24 0858  NA 141 141 140 141  K 4.4 4.9 4.6 4.6  CL 105 109 109 107  CO2 19* 10* <7* 9*  GLUCOSE 176* 190* 217* 243*  BUN 28* 26* 26* 27*  CREATININE 1.02 1.59* 1.60* 1.68*  CALCIUM  9.1 8.9 8.4* 8.6*  MG  --   --  1.9  --   PHOS  --   --  4.7*  --    GFR: Estimated  Creatinine Clearance: 23.9 mL/min (A) (by C-G formula based on SCr of 1.68 mg/dL (H)). Liver Function Tests: Recent Labs  Lab 02/18/24 1146 02/19/24 2010  AST 13* 17  ALT 12 15  ALKPHOS 113 90  BILITOT 0.7 1.8*  PROT 6.6 6.9  ALBUMIN 4.0 3.5   No results for input(s): LIPASE, AMYLASE in the last 168 hours. Recent Labs  Lab 02/20/24 0911  AMMONIA 17   Coagulation Profile: Recent Labs  Lab 02/19/24 2010  INR 1.4*   Cardiac Enzymes: No results for input(s): CKTOTAL, CKMB, CKMBINDEX, TROPONINI in the  last 168 hours. BNP (last 3 results) No results for input(s): PROBNP in the last 8760 hours. HbA1C: No results for input(s): HGBA1C in the last 72 hours. CBG: Recent Labs  Lab 02/20/24 0434 02/20/24 0815  GLUCAP 190* 220*   Lipid Profile: No results for input(s): CHOL, HDL, LDLCALC, TRIG, CHOLHDL, LDLDIRECT in the last 72 hours. Thyroid  Function Tests: Recent Labs    02/20/24 0500  TSH 1.125   Anemia Panel: Recent Labs    02/20/24 0500  VITAMINB12 1,917*   Sepsis Labs: Recent Labs  Lab 02/19/24 2057 02/20/24 0322 02/20/24 0858  LATICACIDVEN 1.8 2.1* 2.6*    Recent Results (from the past 240 hours)  Blood Culture (routine x 2)     Status: None (Preliminary result)   Collection Time: 02/19/24  8:09 PM   Specimen: BLOOD RIGHT FOREARM  Result Value Ref Range Status   Specimen Description BLOOD RIGHT FOREARM  Final   Special Requests   Final    BOTTLES DRAWN AEROBIC AND ANAEROBIC Blood Culture adequate volume   Culture   Final    NO GROWTH < 12 HOURS Performed at [facility], [address] [place], [place] [address]    Report Status PENDING  Incomplete  Blood Culture (routine x 2)     Status: None (Preliminary result)   Collection Time: 02/19/24  8:51 PM   Specimen: BLOOD  Result Value Ref Range Status   Specimen Description BLOOD RIGHT ANTECUBITAL  Final   Special Requests   Final    BOTTLES DRAWN AEROBIC AND  ANAEROBIC Blood Culture adequate volume   Culture   Final    NO GROWTH < 12 HOURS Performed at [facility], [address], [place], [place] [address]    Report Status PENDING  Incomplete  Resp panel by RT-PCR (RSV, Flu A&B, Covid) Anterior Nasal Swab     Status: None   Collection Time: 02/19/24  8:52 PM   Specimen: Anterior Nasal Swab  Result Value Ref Range Status   SARS Coronavirus 2 by RT PCR NEGATIVE NEGATIVE Final   Influenza A by PCR NEGATIVE NEGATIVE Final   Influenza B by PCR NEGATIVE NEGATIVE Final    Comment: (NOTE) The Xpert Xpress SARS-CoV-2/FLU/RSV plus assay is intended as an aid in the diagnosis of influenza from Nasopharyngeal swab specimens and should not be used as a sole basis for treatment. Nasal washings and aspirates are unacceptable for Xpert Xpress SARS-CoV-2/FLU/RSV testing.  Fact Sheet for Patients: BloggerCourse.com  Fact Sheet for Healthcare Providers: SeriousBroker.it  This test is not yet approved or cleared by the [place]  FDA and has been authorized for detection and/or diagnosis of SARS-CoV-2 by FDA under an Emergency Use Authorization (EUA). This EUA will remain in effect (meaning this test can be used) for the duration of the COVID-19 declaration under Section 564(b)(1) of the Act, 21 U.S.C. section 360bbb-3(b)(1), unless the authorization is terminated or revoked.     Resp Syncytial Virus by PCR NEGATIVE NEGATIVE Final    Comment: (NOTE) Fact Sheet for Patients: BloggerCourse.com  Fact Sheet for Healthcare Providers: SeriousBroker.it  This test is not yet approved or cleared by the [place]  FDA and has been authorized for detection and/or diagnosis of SARS-CoV-2 by FDA under an Emergency Use Authorization (EUA). This EUA will remain in effect (meaning this test can be used) for the duration of the COVID-19 declaration  under Section 564(b)(1) of the Act, 21 U.S.C. section 360bbb-3(b)(1), unless the authorization is terminated  or revoked.  Performed at [facility], [address], [place], [place] [address]   Respiratory (~20 pathogens) panel by PCR     Status: None   Collection Time: 02/20/24  3:40 AM   Specimen: Nasopharyngeal Swab; Respiratory  Result Value Ref Range Status   Adenovirus NOT DETECTED NOT DETECTED Final   Coronavirus 229E NOT DETECTED NOT DETECTED Final    Comment: (NOTE) The Coronavirus on the Respiratory Panel, DOES NOT test for the novel  Coronavirus (2019 nCoV)    Coronavirus HKU1 NOT DETECTED NOT DETECTED Final   Coronavirus NL63 NOT DETECTED NOT DETECTED Final   Coronavirus OC43 NOT DETECTED NOT DETECTED Final   Metapneumovirus NOT DETECTED NOT DETECTED Final   Rhinovirus / Enterovirus NOT DETECTED NOT DETECTED Final   Influenza A NOT DETECTED NOT DETECTED Final   Influenza B NOT DETECTED NOT DETECTED Final   Parainfluenza Virus 1 NOT DETECTED NOT DETECTED Final   Parainfluenza Virus 2 NOT DETECTED NOT DETECTED Final   Parainfluenza Virus 3 NOT DETECTED NOT DETECTED Final   Parainfluenza Virus 4 NOT DETECTED NOT DETECTED Final   Respiratory Syncytial Virus NOT DETECTED NOT DETECTED Final   Bordetella pertussis NOT DETECTED NOT DETECTED Final   Bordetella Parapertussis NOT DETECTED NOT DETECTED Final   Chlamydophila pneumoniae NOT DETECTED NOT DETECTED Final   Mycoplasma pneumoniae NOT DETECTED NOT DETECTED Final    Comment: Performed at [facility], [address] [place], [place] [address]  MRSA Next Gen by PCR, Nasal     Status: None   Collection Time: 02/20/24  3:40 AM   Specimen: Nasal Mucosa; Nasal Swab  Result Value Ref Range Status   MRSA by PCR Next Gen NOT DETECTED NOT DETECTED Final    Comment: (NOTE) The GeneXpert MRSA Assay (FDA approved for NASAL specimens only), is one component of a comprehensive MRSA colonization surveillance program.  It is not intended to diagnose MRSA infection nor to guide or monitor treatment for MRSA infections. Test performance is not FDA approved in patients less than 73 years old. Performed at [facility], [address], [place], [place] [address]          Radiology Studies: CT CHEST ABDOMEN PELVIS W CONTRAST Result Date: 02/19/2024 CLINICAL DATA:  Sepsis Head trauma, recent fall w/ type 2 dens fx, sepsis EXAM: CT CHEST, ABDOMEN, AND PELVIS WITH CONTRAST TECHNIQUE: Multidetector CT imaging of the chest, abdomen and pelvis was performed following the standard protocol during bolus administration of intravenous contrast. RADIATION DOSE REDUCTION: This exam was performed according to the departmental dose-optimization program which includes automated exposure control, adjustment of the mA and/or kV according to patient size and/or use of iterative reconstruction technique. CONTRAST:  75mL OMNIPAQUE  IOHEXOL  350 MG/ML SOLN COMPARISON:  None Available. FINDINGS: CHEST: Cardiovascular: No aortic injury. The thoracic aorta is normal in caliber. The heart is normal in size. No significant pericardial effusion. Four-vessel coronary artery calcification. Aortic valve leaflet calcification. Moderate severe atherosclerotic plaque. The main pulmonary artery is normal in caliber. No central pulmonary embolus. Mediastinum/Nodes: No pneumomediastinum. No mediastinal hematoma. The esophagus is unremarkable.  Small hiatal hernia. The thyroid  is unremarkable. The central airways are patent. No mediastinal, hilar, or axillary lymphadenopathy. Lungs/Pleura: Bilateral lower lobe patchy airspace opacities. No pulmonary nodule. No pulmonary mass. No pulmonary contusion or laceration. No pneumatocele formation. No pleural effusion. No pneumothorax. No hemothorax. Musculoskeletal/Chest wall: No chest wall mass. Old healed right rib fracture. No acute rib or sternal  fracture. No spinal fracture. Severe left shoulder  degenerative changes. ABDOMEN / PELVIS: Hepatobiliary: Not enlarged. No focal lesion. No laceration or subcapsular hematoma. Calcified gallstones within the gallbladder lumen. No gallbladder wall thickening or pericholecystic fluid. No biliary ductal dilatation. Pancreas: Diffusely atrophic. Otherwise normal pancreatic contour. No main pancreatic duct dilatation. Spleen: Not enlarged. No focal lesion. No laceration, subcapsular hematoma, or vascular injury. Splenule noted. Adrenals/Urinary Tract: No nodularity bilaterally. Bilateral kidneys enhance symmetrically. No hydronephrosis. No contusion, laceration, or subcapsular hematoma. No injury to the vascular structures or collecting systems. No hydroureter. The urinary bladder is unremarkable. On delayed imaging, there is no urothelial wall thickening and there are no filling defects in the opacified portions of the bilateral collecting systems or ureters. Stomach/Bowel: No small or large bowel wall thickening or dilatation. Colonic diverticulosis. The appendix is unremarkable. Vasculature/Lymphatics: No abdominal aorta or iliac aneurysm. No active contrast extravasation or pseudoaneurysm. No abdominal, pelvic, inguinal lymphadenopathy. Reproductive: Prostate is enlarged measuring up to 5 cm. Other: No simple free fluid ascites. No pneumoperitoneum. No hemoperitoneum. No mesenteric hematoma identified. No organized fluid collection. Musculoskeletal: No significant soft tissue hematoma. No acute pelvic fracture. No spinal fracture. Multilevel severe degenerative changes of the spine. Mild retrolisthesis of L5 on S1. Total bilateral hip arthroplasty partially visualized. Other ports and devices: None. IMPRESSION: 1. Bilateral lower lobe patchy airspace opacities. Finding may represent atelectasis with superimposed infection. 2. No acute intrathoracic, intra-abdominal, intrapelvic traumatic injury. 3. No acute fracture or traumatic malalignment of the thoracic or lumbar  spine. 4. Aortic Atherosclerosis (ICD10-I70.0) including four-vessel coronary artery and aortic valve leaflet calcifications-correlate for aortic stenosis. Cholelithiasis with no CT evidence of acute cholecystitis. Colonic diverticulosis with no acute diverticulitis. Small hiatal hernia. Prostatomegaly. Electronically Signed   By: Morgane  Naveau M.D.   On: 02/19/2024 23:09   CT Head Wo Contrast Result Date: 02/19/2024 CLINICAL DATA:  Head trauma, minor (Age >= 65y); recent fall w type 2 dens fx EXAM: CT HEAD WITHOUT CONTRAST CT CERVICAL SPINE WITHOUT CONTRAST TECHNIQUE: Multidetector CT imaging of the head and cervical spine was performed following the standard protocol without intravenous contrast. Multiplanar CT image reconstructions of the cervical spine were also generated. RADIATION DOSE REDUCTION: This exam was performed according to the departmental dose-optimization program which includes automated exposure control, adjustment of the mA and/or kV according to patient size and/or use of iterative reconstruction technique. COMPARISON:  CT head and C-spine 02/18/2024 FINDINGS: CT HEAD FINDINGS Brain: Cerebral ventricle sizes are concordant with the degree of cerebral volume loss. Trace patchy and confluent areas of decreased attenuation are noted throughout the deep and periventricular white matter of the cerebral hemispheres bilaterally, compatible with chronic microvascular ischemic disease. No evidence of large-territorial acute infarction. No parenchymal hemorrhage. No mass lesion. No extra-axial collection. No mass effect or midline shift. No hydrocephalus. Basilar cisterns are patent. Vascular: No hyperdense vessel. Atherosclerotic calcifications are present within the cavernous internal carotid and vertebral arteries. Skull: No acute fracture or focal lesion. Sinuses/Orbits: Paranasal sinuses and mastoid air cells are clear. Bilateral lens replacement. Otherwise the orbits are unremarkable. Other:  None. CT CERVICAL SPINE FINDINGS Alignment: Stable grade 1 anterolisthesis of C5 on C6 and C6 on C7. Skull base and vertebrae: Redemonstration of acute C2 type 2 dens fracture slightly more displaced with a 2 mm distraction. Multilevel severe degenerative changes of the spine with associated moderate to severe osseous neural foraminal stenosis. No severe osseous central canal stenosis. No aggressive appearing focal osseous lesion or focal pathologic process.  Soft tissues and spinal canal: No prevertebral fluid or swelling. No visible canal hematoma. Upper chest: Unremarkable. Other: None. IMPRESSION: 1.  No acute intracranial abnormality. 2. Redemonstration of acute C2 type 2 dens fracture slightly more displaced with a 2 mm distraction. Electronically Signed   By: Morgane  Naveau M.D.   On: 02/19/2024 22:59   CT Cervical Spine Wo Contrast Result Date: 02/19/2024 CLINICAL DATA:  Head trauma, minor (Age >= 65y); recent fall w type 2 dens fx EXAM: CT HEAD WITHOUT CONTRAST CT CERVICAL SPINE WITHOUT CONTRAST TECHNIQUE: Multidetector CT imaging of the head and cervical spine was performed following the standard protocol without intravenous contrast. Multiplanar CT image reconstructions of the cervical spine were also generated. RADIATION DOSE REDUCTION: This exam was performed according to the departmental dose-optimization program which includes automated exposure control, adjustment of the mA and/or kV according to patient size and/or use of iterative reconstruction technique. COMPARISON:  CT head and C-spine 02/18/2024 FINDINGS: CT HEAD FINDINGS Brain: Cerebral ventricle sizes are concordant with the degree of cerebral volume loss. Trace patchy and confluent areas of decreased attenuation are noted throughout the deep and periventricular white matter of the cerebral hemispheres bilaterally, compatible with chronic microvascular ischemic disease. No evidence of large-territorial acute infarction. No parenchymal  hemorrhage. No mass lesion. No extra-axial collection. No mass effect or midline shift. No hydrocephalus. Basilar cisterns are patent. Vascular: No hyperdense vessel. Atherosclerotic calcifications are present within the cavernous internal carotid and vertebral arteries. Skull: No acute fracture or focal lesion. Sinuses/Orbits: Paranasal sinuses and mastoid air cells are clear. Bilateral lens replacement. Otherwise the orbits are unremarkable. Other: None. CT CERVICAL SPINE FINDINGS Alignment: Stable grade 1 anterolisthesis of C5 on C6 and C6 on C7. Skull base and vertebrae: Redemonstration of acute C2 type 2 dens fracture slightly more displaced with a 2 mm distraction. Multilevel severe degenerative changes of the spine with associated moderate to severe osseous neural foraminal stenosis. No severe osseous central canal stenosis. No aggressive appearing focal osseous lesion or focal pathologic process. Soft tissues and spinal canal: No prevertebral fluid or swelling. No visible canal hematoma. Upper chest: Unremarkable. Other: None. IMPRESSION: 1.  No acute intracranial abnormality. 2. Redemonstration of acute C2 type 2 dens fracture slightly more displaced with a 2 mm distraction. Electronically Signed   By: Morgane  Naveau M.D.   On: 02/19/2024 22:59   DG Chest Port 1 View Result Date: 02/19/2024 CLINICAL DATA:  Questionable sepsis - evaluate for abnormality EXAM: PORTABLE CHEST 1 VIEW COMPARISON:  Chest x-ray 02/18/2024 FINDINGS: The heart and mediastinal contours are unchanged. Atherosclerotic plaque. Low lung volumes. No focal consolidation. No pulmonary edema. No pleural effusion. No pneumothorax. No acute osseous abnormality.  Old healed right rib fracture. IMPRESSION: 1. No active disease. 2.  Aortic Atherosclerosis (ICD10-I70.0). Electronically Signed   By: Morgane  Naveau M.D.   On: 02/19/2024 21:21           LOS: 0 days   Critical care time spent : 45 minutes examining the patient,  discussing with RN, consultants as needed, coordinating care and management.The medical decision making on this patient was of high complexity, the critically ill patient is at high risk for clinical deterioration.  Critical care time was exclusive of separately billable procedures and treating other patients. Critical care was necessary to treat or prevent imminent or life-threatening deterioration. Critical care was time spent personally by me on the following activities: development of treatment plan with patient and/or surrogate as well as nursing, discussions  with consultants, evaluation of patient's response to treatment, examination of patient, obtaining history from patient or surrogate, ordering and performing treatments and interventions, ordering and review of laboratory studies, ordering and review of radiographic studies, pulse oximetry and re-evaluation of patient's condition.    Matthew JAYSON Dare, MD [facility]  If 7PM-7AM, please contact night-coverage  02/20/2024, 10:53 AM

## 2024-02-20 NOTE — Progress Notes (Signed)
 SLP Cancellation Note  Patient Details Name: Matthew Calderon MRN: [id number] DOB: 07-27-1932   Cancelled treatment:       Reason Eval/Treat Not Completed: Other (comment) MBS was scheduled with radiology, but per radiology staff, RN not able to come with pt at that time. Testing will have to be rescheduled for subsequent date. Will f/u as able.     Leita SAILOR., M.A. CCC-SLP Acute Rehabilitation Services Office: [phone]  Secure chat preferred  02/20/2024, 12:45 PM

## 2024-02-21 DIAGNOSIS — I480 Paroxysmal atrial fibrillation: Secondary | ICD-10-CM | POA: Diagnosis not present

## 2024-02-21 DIAGNOSIS — J189 Pneumonia, unspecified organism: Secondary | ICD-10-CM | POA: Diagnosis not present

## 2024-02-21 DIAGNOSIS — I4891 Unspecified atrial fibrillation: Secondary | ICD-10-CM

## 2024-02-21 DIAGNOSIS — I5032 Chronic diastolic (congestive) heart failure: Secondary | ICD-10-CM | POA: Diagnosis not present

## 2024-02-21 DIAGNOSIS — I214 Non-ST elevation (NSTEMI) myocardial infarction: Secondary | ICD-10-CM | POA: Diagnosis not present

## 2024-02-21 LAB — BASIC METABOLIC PANEL WITH GFR
Anion gap: 10 (ref 5–15)
BUN: 17 mg/dL (ref 8–23)
CO2: 18 mmol/L — ABNORMAL LOW (ref 22–32)
Calcium: 7.7 mg/dL — ABNORMAL LOW (ref 8.9–10.3)
Chloride: 111 mmol/L (ref 98–111)
Creatinine, Ser: 0.92 mg/dL (ref 0.61–1.24)
GFR, Estimated: >60 mL/min (ref >60)
Glucose, Bld: 141 mg/dL — ABNORMAL HIGH (ref 70–99)
Potassium: 3.4 mmol/L — ABNORMAL LOW (ref 3.5–5.1)
Sodium: 139 mmol/L (ref 135–145)

## 2024-02-21 LAB — HEPARIN LEVEL (UNFRACTIONATED)
Heparin Unfractionated: 0.32 [IU]/mL (ref 0.30–0.70)
Heparin Unfractionated: 0.43 [IU]/mL (ref 0.30–0.70)

## 2024-02-21 LAB — GLUCOSE, CAPILLARY
Glucose-Capillary: 124 mg/dL — ABNORMAL HIGH (ref 70–99)
Glucose-Capillary: 133 mg/dL — ABNORMAL HIGH (ref 70–99)
Glucose-Capillary: 139 mg/dL — ABNORMAL HIGH (ref 70–99)
Glucose-Capillary: 146 mg/dL — ABNORMAL HIGH (ref 70–99)
Glucose-Capillary: 147 mg/dL — ABNORMAL HIGH (ref 70–99)
Glucose-Capillary: 148 mg/dL — ABNORMAL HIGH (ref 70–99)
Glucose-Capillary: 148 mg/dL — ABNORMAL HIGH (ref 70–99)
Glucose-Capillary: 149 mg/dL — ABNORMAL HIGH (ref 70–99)
Glucose-Capillary: 158 mg/dL — ABNORMAL HIGH (ref 70–99)
Glucose-Capillary: 179 mg/dL — ABNORMAL HIGH (ref 70–99)

## 2024-02-21 LAB — CBC
HCT: 33.4 % — ABNORMAL LOW (ref 39.0–52.0)
Hemoglobin: 11.2 g/dL — ABNORMAL LOW (ref 13.0–17.0)
MCH: 29.9 pg (ref 26.0–34.0)
MCHC: 33.5 g/dL (ref 30.0–36.0)
MCV: 89.1 fL (ref 80.0–100.0)
Platelets: 141 K/uL — ABNORMAL LOW (ref 150–400)
RBC: 3.75 MIL/uL — ABNORMAL LOW (ref 4.22–5.81)
RDW: 13.8 % (ref 11.5–15.5)
WBC: 10.1 K/uL (ref 4.0–10.5)
nRBC: 0 % (ref 0.0–0.2)

## 2024-02-21 LAB — BETA-HYDROXYBUTYRIC ACID
Beta-Hydroxybutyric Acid: 0.25 mmol/L (ref 0.05–0.27)
Beta-Hydroxybutyric Acid: 0.78 mmol/L — ABNORMAL HIGH (ref 0.05–0.27)

## 2024-02-21 LAB — TROPONIN I (HIGH SENSITIVITY)
Troponin I (High Sensitivity): 512 ng/L (ref <18)
Troponin I (High Sensitivity): 363 ng/L (ref <18)

## 2024-02-21 MED ORDER — AMIODARONE HCL IN DEXTROSE 360-4.14 MG/200ML-% IV SOLN
30.0000 mg/h | INTRAVENOUS | Status: DC
  Administered 2024-02-21 – 2024-02-23 (×5): 30 mg/h via INTRAVENOUS
  Filled 2024-02-21 (×5): qty 200

## 2024-02-21 MED ORDER — AMIODARONE HCL IN DEXTROSE 360-4.14 MG/200ML-% IV SOLN
60.0000 mg/h | INTRAVENOUS | Status: AC
  Administered 2024-02-21 (×2): 60 mg/h via INTRAVENOUS
  Filled 2024-02-21: qty 200

## 2024-02-21 MED ORDER — SODIUM CHLORIDE 0.9 % IV SOLN
500.0000 mg | INTRAVENOUS | Status: DC
  Administered 2024-02-21 – 2024-02-22 (×2): 500 mg via INTRAVENOUS
  Filled 2024-02-21 (×2): qty 5

## 2024-02-21 MED ORDER — INSULIN ASPART 100 UNIT/ML IJ SOLN
0.0000 [IU] | Freq: Three times a day (TID) | INTRAMUSCULAR | Status: DC
  Administered 2024-02-21: 1 [IU] via SUBCUTANEOUS
  Administered 2024-02-21: 2 [IU] via SUBCUTANEOUS
  Administered 2024-02-22 – 2024-02-23 (×2): 1 [IU] via SUBCUTANEOUS

## 2024-02-21 MED ORDER — INSULIN ASPART 100 UNIT/ML IJ SOLN: 0.0000 [IU] | Freq: Every day | INTRAMUSCULAR | Status: DC

## 2024-02-21 MED ORDER — INSULIN GLARGINE 100 UNIT/ML ~~LOC~~ SOLN
15.0000 [IU] | Freq: Every day | SUBCUTANEOUS | Status: DC
  Administered 2024-02-21 – 2024-02-22 (×2): 15 [IU] via SUBCUTANEOUS
  Filled 2024-02-21 (×4): qty 0.15

## 2024-02-21 MED ORDER — METOPROLOL TARTRATE 5 MG/5ML IV SOLN
5.0000 mg | INTRAVENOUS | Status: DC | PRN
  Administered 2024-02-21 – 2024-02-24 (×6): 5 mg via INTRAVENOUS
  Filled 2024-02-21 (×7): qty 5

## 2024-02-21 MED ORDER — DILTIAZEM HCL 25 MG/5ML IV SOLN: 10.0000 mg | Freq: Once | INTRAVENOUS | Status: DC

## 2024-02-21 MED ORDER — SODIUM CHLORIDE 0.9 % IV SOLN
1.0000 g | INTRAVENOUS | Status: AC
  Administered 2024-02-21 – 2024-02-27 (×7): 1 g via INTRAVENOUS
  Filled 2024-02-21 (×7): qty 10

## 2024-02-21 MED ORDER — POTASSIUM CHLORIDE CRYS ER 20 MEQ PO TBCR: 40.0000 meq | EXTENDED_RELEASE_TABLET | Freq: Once | ORAL | Status: DC

## 2024-02-21 MED ORDER — AMIODARONE LOAD VIA INFUSION
150.0000 mg | Freq: Once | INTRAVENOUS | Status: AC
  Administered 2024-02-21: 150 mg via INTRAVENOUS
  Filled 2024-02-21: qty 83.34

## 2024-02-21 NOTE — Progress Notes (Addendum)
 PHARMACY - ANTICOAGULATION CONSULT NOTE  Pharmacy Consult for IV heparin  Indication: chest pain/ACS  Allergies  Allergen Reactions   Alendronate Sodium     Other reaction(s): diarrhea   Penicillin G     Other reaction(s): Unknown   Penicillins Hives    DID THE REACTION INVOLVE: Swelling of the face/tongue/throat, SOB, or low BP? No Sudden or severe rash/hives, skin peeling, or the inside of the mouth or nose? No Did it require medical treatment? No When did it last happen?childhood allergy       If all above answers are "NO", may proceed with cephalosporin use.    Sulfa Antibiotics Hives   Sulfacetamide Sodium     Other reaction(s): Unknown    Patient Measurements: Height: 4' 11 (149.9 cm) Weight: 72 kg (158 lb 11.7 oz) IBW/kg (Calculated) : 47.7 HEPARIN  DW (KG): 62.7  Vital Signs: Temp: 98.7 F (37.1 C) (08/30 0739) Temp Source: Oral (08/30 0739) BP: 114/63 (08/30 0739) Pulse Rate: 90 (08/30 0739)  Labs: Recent Labs    02/19/24 2010 02/20/24 0322 02/20/24 0600 02/20/24 1309 02/20/24 1341 02/20/24 1620 02/20/24 2051 02/21/24 0313 02/21/24 0845  HGB 14.2 12.7*  --   --   --   --   --  11.2*  --   HCT 44.3 41.0  --   --   --   --   --  33.4*  --   PLT 213 192  --   --   --   --   --  141*  --   LABPROT 17.7*  --   --   --   --   --   --   --   --   INR 1.4*  --   --   --   --   --   --   --   --   HEPARINUNFRC  --   --   --   --   --   --  <0.10*  --  0.32  CREATININE 1.59*  --    < > 1.36* 1.29* 1.18 1.07 0.92  --   TROPONINIHS  --  310*   < > 1,002* 1,119*  --   --   --  512*   < > = values in this interval not displayed.    Estimated Creatinine Clearance: 44.2 mL/min (by C-G formula based on SCr of 0.92 mg/dL).   Medical History: Past Medical History:  Diagnosis Date   Anemia    takes iron pill   Anxiety    Arthritis    Depression    Diabetes mellitus without complication (HCC)    GERD (gastroesophageal reflux disease)    pepto   H/O hiatal  hernia    Pneumonia    4 years ago   PONV (postoperative nausea and vomiting) 05/14/2019   Post-nasal drip    Prostate hyperplasia, benign localized, without urinary obstruction    Seasonal allergies    Sleep apnea    no CPAP    Medications:  Infusions:   amiodarone      Followed by   amiodarone      azithromycin  500 mg (02/21/24 0816)   cefTRIAXone  (ROCEPHIN )  IV 1 g (02/21/24 0832)   heparin  1,100 Units/hr (02/20/24 2144)   insulin  Stopped (02/21/24 9072)    Assessment: 89 yo male admitted with falls/AMS, found with elevated troponins and pharmacy asked to dose IV heparin .  No known anticoagulants PTA.  -heparin  level= 0.32 and at goal on 1100 units/hr -  he is now noted with afib  Goal of Therapy:  Heparin  level 0.3-0.7 units/ml Monitor platelets by anticoagulation protocol: Yes   Plan:  -Increase heparin  to 1200 units.hr to keep in goal -Recheck heparin  level in 8  hrs  Prentice Poisson, PharmD Clinical Pharmacist **Pharmacist phone directory can now be found on [email or web address] ([email or web address]).  Listed under [facility].    [facility] pharmacy phone numbers are listed on [email or web address]

## 2024-02-21 NOTE — Progress Notes (Signed)
 Progress Note  Patient Name: Matthew Calderon Date of Encounter: 02/21/2024 Primary Cardiologist: Lynwood Schilling, MD   Subjective   This AM new onset AF RVR. Patient notes no symptoms.  Vital Signs    Vitals:   02/20/24 2345 02/21/24 0334 02/21/24 0732 02/21/24 0739  BP: (!) 98/53 (!) 98/57 135/84 114/63  Pulse: 91 90 91 90  Resp: (!) 22 18 (!) 21 (!) 25  Temp: 99.2 F (37.3 C) 97.8 F (36.6 C) 98.7 F (37.1 C) 98.7 F (37.1 C)  TempSrc: Oral Oral Oral Oral  SpO2: 95% 95% 93% 95%  Weight:  72 kg    Height:        Intake/Output Summary (Last 24 hours) at 02/21/2024 0814 Last data filed at 02/20/2024 1933 Gross per 24 hour  Intake 2072.79 ml  Output 250 ml  Net 1822.79 ml   Filed Weights   02/20/24 0329 02/21/24 0334  Weight: 69.9 kg 72 kg    Physical Exam   GEN: No acute distress.   Neck: neck collar Cardiac: IRIR tachycardia, no murmurs, rubs, or gallops.  Respiratory: Clear to auscultation bilaterally. GI: Soft, nontender, non-distended  MS: No edema  Labs   Telemetry: AF RVR onset 932 AM   Chemistry Recent Labs  Lab 02/18/24 1146 02/19/24 2010 02/20/24 0600 02/20/24 1309 02/20/24 1341 02/20/24 1620 02/20/24 2051 02/21/24 0313  NA 141 141   < > 142   < > 140 139 139  K 4.4 4.9   < > 4.3   < > 3.8 3.5 3.4*  CL 105 109   < > 112*   < > 110 108 111  CO2 19* 10*   < > 13*   < > 17* 17* 18*  GLUCOSE 176* 190*   < > 256*   < > 191* 142* 141*  BUN 28* 26*   < > 25*   < > 22 20 17   CREATININE 1.02 1.59*   < > 1.36*   < > 1.18 1.07 0.92  CALCIUM  9.1 8.9   < > 8.1*   < > 8.1* 8.2* 7.7*  PROT 6.6 6.9  --  6.1*  --   --   --   --   ALBUMIN 4.0 3.5  --  3.0*  --   --   --   --   AST 13* 17  --  19  --   --   --   --   ALT 12 15  --  10  --   --   --   --   ALKPHOS 113 90  --  78  --   --   --   --   BILITOT 0.7 1.8*  --  1.2  --   --   --   --   GFRNONAA >60 41*   < > 50*   < > 59* >60 >60  ANIONGAP 17* 22*   < > 17*   < > 13 14 10    < > = values in  this interval not displayed.     Hematology Recent Labs  Lab 02/19/24 2010 02/20/24 0322 02/21/24 0313  WBC 16.6* 20.3* 10.1  RBC 4.73 4.29 3.75*  HGB 14.2 12.7* 11.2*  HCT 44.3 41.0 33.4*  MCV 93.7 95.6 89.1  MCH 30.0 29.6 29.9  MCHC 32.1 31.0 33.5  RDW 13.7 14.0 13.8  PLT 213 192 141*      Cardiac Studies   Cardiac Studies &  Procedures   ______________________________________________________________________________________________     ECHOCARDIOGRAM  ECHOCARDIOGRAM COMPLETE 02/20/2024  Narrative ECHOCARDIOGRAM REPORT    Patient Name:   SADLER TESCHNER Date of Exam: 02/20/2024 Medical Rec #:  [id number]      Height:       59.0 in Accession #:    [id number]     Weight:       154.0 lb Date of Birth:  1933-03-04      BSA:          1.650 m Patient Age:    89 years       BP:           107/60 mmHg Patient Gender: M              HR:           87 bpm. Exam Location:  Inpatient  Procedure: 2D Echo, Color Doppler and Cardiac Doppler (Both Spectral and Color Flow Doppler were utilized during procedure).  Indications:    Cardiomyopathy I42.9  History:        Patient has prior history of Echocardiogram examinations, most recent 01/12/2020. Cardiomyopathy; Risk Factors:Diabetes.  Sonographer:    BERNARDA ROCKS Referring Phys: ANKIT C AMIN  IMPRESSIONS   1. Left ventricular ejection fraction, by estimation, is 60 to 65%. The left ventricle has normal function. The left ventricle has no regional wall motion abnormalities. Left ventricular diastolic parameters are consistent with Grade I diastolic dysfunction (impaired relaxation). 2. Right ventricular systolic function is normal. The right ventricular size is normal. Tricuspid regurgitation signal is inadequate for assessing PA pressure. 3. The mitral valve is degenerative. Trivial mitral valve regurgitation. 4. The aortic valve is tricuspid. Aortic valve regurgitation is trivial. Aortic valve sclerosis/calcification is  present, without any evidence of aortic stenosis. 5. There is Moderate (Grade III) calcified plaque involving the aortic root. 6. The inferior vena cava is normal in size with <50% respiratory variability, suggesting right atrial pressure of 8 mmHg.  Comparison(s): Changes from prior study are noted. 01/12/2020: LVEF 55-60%.  FINDINGS Left Ventricle: Left ventricular ejection fraction, by estimation, is 60 to 65%. The left ventricle has normal function. The left ventricle has no regional wall motion abnormalities. The left ventricular internal cavity size was normal in size. There is no left ventricular hypertrophy. Left ventricular diastolic parameters are consistent with Grade I diastolic dysfunction (impaired relaxation). Indeterminate filling pressures.  Right Ventricle: The right ventricular size is normal. No increase in right ventricular wall thickness. Right ventricular systolic function is normal. Tricuspid regurgitation signal is inadequate for assessing PA pressure.  Left Atrium: Left atrial size was normal in size.  Right Atrium: Right atrial size was normal in size.  Pericardium: There is no evidence of pericardial effusion. Presence of epicardial fat layer.  Mitral Valve: The mitral valve is degenerative in appearance. Mild mitral annular calcification. Trivial mitral valve regurgitation. MV peak gradient, 4.9 mmHg. The mean mitral valve gradient is 2.0 mmHg.  Tricuspid Valve: The tricuspid valve is grossly normal. Tricuspid valve regurgitation is trivial.  Aortic Valve: The aortic valve is tricuspid. Aortic valve regurgitation is trivial. Aortic valve sclerosis/calcification is present, without any evidence of aortic stenosis. Aortic valve mean gradient measures 5.0 mmHg. Aortic valve peak gradient measures 10.4 mmHg. Aortic valve area, by VTI measures 2.17 cm.  Pulmonic Valve: The pulmonic valve was grossly normal. Pulmonic valve regurgitation is trivial.  Aorta: The aortic  root and ascending aorta are structurally normal, with no evidence of dilitation. There  is moderate (Grade III) calcified plaque involving the aortic root.  Venous: The inferior vena cava is normal in size with less than 50% respiratory variability, suggesting right atrial pressure of 8 mmHg.  IAS/Shunts: No atrial level shunt detected by color flow Doppler.   LEFT VENTRICLE PLAX 2D LVIDd:         4.40 cm      Diastology LVIDs:         3.10 cm      LV e' medial:    7.94 cm/s LV PW:         0.90 cm      LV E/e' medial:  12.8 LV IVS:        1.00 cm      LV e' lateral:   11.40 cm/s LVOT diam:     2.00 cm      LV E/e' lateral: 8.9 LV SV:         63 LV SV Index:   38 LVOT Area:     3.14 cm  LV Volumes (MOD) LV vol d, MOD A2C: 79.1 ml LV vol d, MOD A4C: 140.0 ml LV vol s, MOD A2C: 26.7 ml LV vol s, MOD A4C: 52.0 ml LV SV MOD A2C:     52.4 ml LV SV MOD A4C:     140.0 ml LV SV MOD BP:      72.0 ml  RIGHT VENTRICLE RV Basal diam:  3.60 cm RV S prime:     14.10 cm/s TAPSE (M-mode): 1.9 cm  LEFT ATRIUM             Index        RIGHT ATRIUM           Index LA diam:        3.70 cm 2.24 cm/m   RA Area:     10.90 cm LA Vol (A2C):   29.0 ml 17.57 ml/m  RA Volume:   20.10 ml  12.18 ml/m LA Vol (A4C):   34.2 ml 20.72 ml/m LA Biplane Vol: 31.6 ml 19.15 ml/m AORTIC VALVE                     PULMONIC VALVE AV Area (Vmax):    2.01 cm      PV Vmax:       0.72 m/s AV Area (Vmean):   1.90 cm      PV Peak grad:  2.1 mmHg AV Area (VTI):     2.17 cm AV Vmax:           161.00 cm/s AV Vmean:          105.000 cm/s AV VTI:            0.290 m AV Peak Grad:      10.4 mmHg AV Mean Grad:      5.0 mmHg LVOT Vmax:         103.00 cm/s LVOT Vmean:        63.400 cm/s LVOT VTI:          0.200 m LVOT/AV VTI ratio: 0.69  AORTA Ao Root diam: 3.30 cm Ao Asc diam:  3.60 cm  MITRAL VALVE MV Area (PHT): 2.95 cm     SHUNTS MV Area VTI:   1.94 cm     Systemic VTI:  0.20 m MV Peak grad:  4.9 mmHg      Systemic Diam: 2.00 cm MV Mean grad:  2.0 mmHg MV Vmax:  1.11 m/s MV Vmean:      73.4 cm/s MV Decel Time: 257 msec MV E velocity: 102.00 cm/s MV A velocity: 111.00 cm/s MV E/A ratio:  0.92  Vinie Maxcy MD Electronically signed by Vinie Maxcy MD Signature Date/Time: 02/20/2024/3:20:49 PM    Final          ______________________________________________________________________________________________        Assessment & Plan   PAF RVR - in the setting of PNA with CHADSVASC 5+ - continue heparin  - low BP; start IV amodarone bolus and drip - EKG ordered - he is asymptomatic of RVR  NSTEMI - asymptomatic with trop trend flattening and no wall motion abnormalities in the setting of sepsis with PNA AGMA and DKA - this is most consistent with a Type II NSTEMI with demand ischemia  - Patient does have four-vessel coronary artery calcification on CT chest.  Continue aspirin  81 mg daily and Lipitor 40 mg daily   Chronic HFimpEF  - EF previously 25-30% in 05/2018 in the setting of sepsis.  Treated with GDMT.  Repeat echo in 12/2019 showed EF 55-60%, grade 1 DD, normal RV systolic function - Okay to hold GDMT while patient septic with low BP. He is euvolemic on exam. Dynamic function now is likely related to sepsis   Otherwise per primary  - Sepsis - Multifocal pneumonia - Recent ground-level fall with type II dens fracture - Encephalopathy, suspected be metabolic in setting of underlying infection - Severe metabolic acidosis, suspected DKA - AKI - Isolated elevation in T. bili   For questions or updates, please contact [facility] Please consult [email or web address] for contact info under Cardiology/STEMI.      Stanly Leavens, MD FASE [facility] Cardiologist [facility]  [address] [place], [address] [place], [place] [address] [phone]  8:15 AM

## 2024-02-21 NOTE — Progress Notes (Signed)
 ANTICOAGULATION CONSULT NOTE  Pharmacy Consult for Heparin  Indication: chest pain/ACS  Allergies  Allergen Reactions   Alendronate Sodium     Other reaction(s): diarrhea   Penicillin G     Other reaction(s): Unknown   Penicillins Hives    DID THE REACTION INVOLVE: Swelling of the face/tongue/throat, SOB, or low BP? No Sudden or severe rash/hives, skin peeling, or the inside of the mouth or nose? No Did it require medical treatment? No When did it last happen?childhood allergy       If all above answers are "NO", may proceed with cephalosporin use.    Sulfa Antibiotics Hives   Sulfacetamide Sodium     Other reaction(s): Unknown    Patient Measurements: Height: 4' 11 (149.9 cm) Weight: 72 kg (158 lb 11.7 oz) IBW/kg (Calculated) : 47.7 Heparin  Dosing Weight: 62.7 kg  Vital Signs: Temp: 97.6 F (36.4 C) (08/30 2025) Temp Source: Oral (08/30 2025) BP: 130/82 (08/30 2025) Pulse Rate: 140 (08/30 2025)  Labs: Recent Labs    02/19/24 2010 02/20/24 0322 02/20/24 0600 02/20/24 1341 02/20/24 1620 02/20/24 2051 02/21/24 0313 02/21/24 0845 02/21/24 1219 02/21/24 1931  HGB 14.2 12.7*  --   --   --   --  11.2*  --   --   --   HCT 44.3 41.0  --   --   --   --  33.4*  --   --   --   PLT 213 192  --   --   --   --  141*  --   --   --   LABPROT 17.7*  --   --   --   --   --   --   --   --   --   INR 1.4*  --   --   --   --   --   --   --   --   --   HEPARINUNFRC  --   --   --   --   --  <0.10*  --  0.32  --  0.43  CREATININE 1.59*  --    < > 1.29* 1.18 1.07 0.92  --   --   --   TROPONINIHS  --  310*   < > 1,119*  --   --   --  512* 363*  --    < > = values in this interval not displayed.    Estimated Creatinine Clearance: 44.2 mL/min (by C-G formula based on SCr of 0.92 mg/dL).   Medical History: Past Medical History:  Diagnosis Date   Anemia    takes iron pill   Anxiety    Arthritis    Depression    Diabetes mellitus without complication (HCC)    GERD  (gastroesophageal reflux disease)    pepto   H/O hiatal hernia    Pneumonia    4 years ago   PONV (postoperative nausea and vomiting) 05/14/2019   Post-nasal drip    Prostate hyperplasia, benign localized, without urinary obstruction    Seasonal allergies    Sleep apnea    no CPAP    Medications:  Medications Prior to Admission  Medication Sig Dispense Refill Last Dose/Taking   acetaminophen  (TYLENOL ) 500 MG tablet Take 650 mg by mouth 2 (two) times daily as needed for mild pain (pain score 1-3) or moderate pain (pain score 4-6).   Unknown   aspirin  81 MG chewable tablet Chew 81 mg by mouth daily.  Unknown   atorvastatin  (LIPITOR) 40 MG tablet Take 1 tablet (40 mg total) by mouth daily at 6 PM.   Unknown   brimonidine  (ALPHAGAN ) 0.2 % ophthalmic solution Place 1 drop into both eyes 2 (two) times daily.   Unknown   carvedilol  (COREG ) 6.25 MG tablet Take 6.25 mg by mouth 2 (two) times daily with a meal.   Unknown   cetirizine (ZYRTEC) 10 MG tablet Take 10 mg by mouth daily.   Unknown   cholecalciferol  (VITAMIN D3) 25 MCG (1000 UNIT) tablet Take 1,000 Units by mouth daily.   Unknown   Empagliflozin-Linaglip-Metform (TRIJARDY XR) 25-10-998 MG TB24 Take 1 tablet by mouth daily.   Unknown   Ferrous Sulfate  Dried 143 (45 Fe) MG TBCR Take 45 mg by mouth daily.   Unknown   finasteride  (PROSCAR ) 5 MG tablet Take 5 mg by mouth every evening.    Unknown   furosemide  (LASIX ) 20 MG tablet Take 20 mg by mouth daily as needed for fluid or edema.   Unknown   guaiFENesin  (MUCINEX ) 600 MG 12 hr tablet Take 1 tablet (600 mg total) by mouth 2 (two) times daily.   Unknown   mirabegron  ER (MYRBETRIQ ) 50 MG TB24 tablet Take 50 mg by mouth daily.   Unknown   Multiple Vitamins-Minerals (PRESERVISION AREDS 2) CAPS Take 1 capsule by mouth 2 (two) times daily.    Unknown   potassium chloride  (KLOR-CON ) 10 MEQ tablet Take 10 mEq by mouth daily.   Unknown   sacubitril -valsartan  (ENTRESTO ) 24-26 MG Take 1 tablet by  mouth 2 (two) times daily.   Unknown   sertraline  (ZOLOFT ) 50 MG tablet Take 50 mg by mouth every evening.   Unknown   SYNJARDY XR 25-1000 MG TB24 Take 1 tablet by mouth daily.    Unknown   tamsulosin  (FLOMAX ) 0.4 MG CAPS capsule Take 0.4 mg by mouth at bedtime.   Unknown   timolol  (TIMOPTIC ) 0.5 % ophthalmic solution Place 1 drop into both eyes 2 (two) times daily.   Unknown   Scheduled:   aspirin   81 mg Oral Daily   atorvastatin   40 mg Oral QPM   brimonidine   1 drop Both Eyes BID   finasteride   5 mg Oral QPM   insulin  aspart  0-5 Units Subcutaneous QHS   insulin  aspart  0-9 Units Subcutaneous TID WC   insulin  glargine  15 Units Subcutaneous Daily   potassium chloride   40 mEq Oral Once   sertraline   50 mg Oral QPM   sodium chloride  flush  3 mL Intravenous Q12H   tamsulosin   0.4 mg Oral QHS   timolol   1 drop Both Eyes BID   Infusions:   amiodarone  30 mg/hr (02/21/24 2033)   azithromycin  500 mg (02/21/24 0816)   cefTRIAXone  (ROCEPHIN )  IV 1 g (02/21/24 0832)   heparin  1,200 Units/hr (02/21/24 1057)   insulin  Stopped (02/21/24 0927)   PRN: albuterol , dextrose , haloperidol  lactate, metoprolol  tartrate  Assessment: 89 yo male admitted with falls/AMS, found with elevated troponins and pharmacy asked to dose IV heparin .  No known anticoagulants PTA.   Levels: 8/30 0845 0.32 >> Rate increased to 1200 units/hr 8/30 1931 0.43 >> Therapeutic  Hgb 11.2; plt 141  Goal of Therapy:  Heparin  level 0.3-0.7 units/ml Monitor platelets by anticoagulation protocol: Yes   Plan:  Continue heparin  infusion at 1200 units/hr Check anti-Xa level at 5am  Daily heparin  level while on heparin  Continue to monitor H&H and platelets  Dorn Buttner, PharmD, BCPS  02/21/2024 9:29 PM ED Clinical Pharmacist -  [phone]

## 2024-02-21 NOTE — Progress Notes (Signed)
 PROGRESS NOTE    Matthew Calderon  FMW:[id number] DOB: [birth year] DOA: 02/19/2024 PCP: Caleen Dirks, MD    Brief Narrative:   89 year old with history of CAD, CHF, DM2, HTN, HLD, OSA, disorder, recurrent falls comes to the ED from [facility] came to the ED on 8/27 from a ground-level fall found to have type II dens fracture seen by neurosurgery and placed in hard collar with plans to follow-up outpatient in 2 weeks.  But now brought back to the hospital due to concerns of fever and change in mental status.  COVID/RSV/flu were negative.  CT chest abdomen pelvis shows concerns of bilateral patchy opacity, CT head negative for acute pathology, CT cervical spine is also negative.  Assessment & Plan:  Principal Problem:   CAP (community acquired pneumonia) Active Problems:   Sepsis (HCC)     Sepsis, multifocal pneumonia -Concerns of multifocal pneumonia seen on CT chest - Change antibiotics to Rocephin  and azithromycin . - COVID/flu/RSV negative - Bronchodilators, I-S/flutter valve.  Supportive care   Recent ground-level fall complicated by type II dens fracture Repeat CT C-spine demonstrates slightly more displaced type II dens with 2 mm distraction. Clinically neurologically intact in all extremities Maintain hard collar all the time - Follow-up outpatient neurosurgery   Encephalopathy, metabolic suspected setting of underlying infection CT head with no acute intracranial abnormality.  Likely in the setting of infection.  B12 normal - TSH and ammonia are normal  Intermittent A-fib with RVR - Likely from underlying physiologic stress.  Getting electrolyte repletion.  IV Lopressor /Cardizem  ordered depending on his blood pressure.  Continue heparin  drip.  Cardiology is already following the patient.  EKG ordered   AGMA -> severe metabolic acidosis Diabetic ketoacidosis, resolved Hx of DM2 Currently in severe acidosis.  I suspect this could be related to his home [place].  Will  currently hold this as I suspect this could be related to euglycemic DKA with elevated beta hydroxybutyrate.  DKA now has resolved therefore transition to subcu insulin  along with sliding scale.  Will eventually determine his home regimen Home meds on hold.   Elevated troponin Coronary artery disease - Denies any chest pain, rising troponin.  CT showing evidence of 4 vessel disease.  Currently suspect demand ischemia.  Echocardiogram shows preserved EF, grade 3 aortic root plaque.  Cardiology team following.  Defer further workup to their service.  Continue heparin  drip for now.  Acute kidney injury resolved Baseline creatinine 0.7 - 1, elevated to 1.5 on admission.  Continue IV fluids   Isolated elevation in T Bili  -Trend LFT   Incidental findings: Aortic atherosclerosis, 4 vessel CAD, aortic valve calcification Cholelithiasis, diverticulosis, small hiatal hernia, prostamegaly   CHF with EF 55% Hold home Entresto , carvedilol , SGLT2i     Hypertension Hold home antihypertensives in the setting of sepsis  Hyperlipidemia See CAD  OSA  Not on CPAP  Mood disorder  Continue home sertraline   Recurrent falls PT/OT-SNF  DVT prophylaxis: Lovenox .     Code Status: Full Code Family Communication:   Status is: Inpatient continue hospital stay for management of multiple medical issues as mentioned above   Subjective: Leaving this morning later went into atrial fibrillation with RVR.  No other complaints. Examination:  General exam: Appears calm and comfortable, hard c-collar in place.  Elderly frail Respiratory system: Clear to auscultation. Respiratory effort normal. Cardiovascular system: S1 & S2 heard, RRR. No JVD, murmurs, rubs, gallops or clicks. No pedal edema. Gastrointestinal system: Abdomen is nondistended, soft and nontender.  No organomegaly or masses felt. Normal bowel sounds heard. Central nervous system: Alert and oriented. No focal neurological  deficits. Extremities: Symmetric 5 x 5 power. Skin: No rashes, lesions or ulcers Psychiatry: Judgement and insight appear normal. Mood & affect appropriate.                Diet Orders (From admission, onward)     Start     Ordered   02/20/24 0857  Diet Carb Modified Fluid consistency: Thin; Room service appropriate? Yes  (Diabetes Ketoacidosis (DKA))  Diet effective now       Question Answer Comment  Diet-HS Snack? Nothing   Calorie Level Medium 1600-2000   Fluid consistency: Thin   Room service appropriate? Yes      02/20/24 0858            Objective: Vitals:   02/20/24 2345 02/21/24 0334 02/21/24 0732 02/21/24 0739  BP: (!) 98/53 (!) 98/57 135/84 114/63  Pulse: 91 90 91 90  Resp: (!) 22 18 (!) 21 (!) 25  Temp: 99.2 F (37.3 C) 97.8 F (36.6 C) 98.7 F (37.1 C) 98.7 F (37.1 C)  TempSrc: Oral Oral Oral Oral  SpO2: 95% 95% 93% 95%  Weight:  72 kg    Height:        Intake/Output Summary (Last 24 hours) at 02/21/2024 0949 Last data filed at 02/20/2024 1933 Gross per 24 hour  Intake 2072.79 ml  Output 250 ml  Net 1822.79 ml   Filed Weights   02/20/24 0329 02/21/24 0334  Weight: 69.9 kg 72 kg    Scheduled Meds:  aspirin   81 mg Oral Daily   atorvastatin   40 mg Oral QPM   brimonidine   1 drop Both Eyes BID   finasteride   5 mg Oral QPM   insulin  aspart  0-5 Units Subcutaneous QHS   insulin  aspart  0-9 Units Subcutaneous TID WC   insulin  glargine  15 Units Subcutaneous Daily   potassium chloride   40 mEq Oral Once   sertraline   50 mg Oral QPM   sodium chloride  flush  3 mL Intravenous Q12H   tamsulosin   0.4 mg Oral QHS   timolol   1 drop Both Eyes BID   Continuous Infusions:  azithromycin  500 mg (02/21/24 0816)   cefTRIAXone  (ROCEPHIN )  IV 1 g (02/21/24 0832)   heparin  1,100 Units/hr (02/20/24 2144)   insulin  Stopped (02/21/24 9072)    Nutritional status     Body mass index is 32.06 kg/m.  Data Reviewed:   CBC: Recent Labs  Lab  02/18/24 1146 02/19/24 2010 02/20/24 0322 02/21/24 0313  WBC 7.9 16.6* 20.3* 10.1  NEUTROABS 5.5 14.3*  --   --   HGB 14.1 14.2 12.7* 11.2*  HCT 43.0 44.3 41.0 33.4*  MCV 88.3 93.7 95.6 89.1  PLT 163 213 192 141*   Basic Metabolic Panel: Recent Labs  Lab 02/20/24 0600 02/20/24 0858 02/20/24 1309 02/20/24 1341 02/20/24 1620 02/20/24 2051 02/21/24 0313  NA 140   < > 142 141 140 139 139  K 4.6   < > 4.3 3.7 3.8 3.5 3.4*  CL 109   < > 112* 115* 110 108 111  CO2 <7*   < > 13* 15* 17* 17* 18*  GLUCOSE 217*   < > 256* 247* 191* 142* 141*  BUN 26*   < > 25* 23 22 20 17   CREATININE 1.60*   < > 1.36* 1.29* 1.18 1.07 0.92  CALCIUM  8.4*   < >  8.1* 7.9* 8.1* 8.2* 7.7*  MG 1.9  --   --   --   --   --   --   PHOS 4.7*  --   --   --   --   --   --    < > = values in this interval not displayed.   GFR: Estimated Creatinine Clearance: 44.2 mL/min (by C-G formula based on SCr of 0.92 mg/dL). Liver Function Tests: Recent Labs  Lab 02/18/24 1146 02/19/24 2010 02/20/24 1309  AST 13* 17 19  ALT 12 15 10   ALKPHOS 113 90 78  BILITOT 0.7 1.8* 1.2  PROT 6.6 6.9 6.1*  ALBUMIN 4.0 3.5 3.0*   No results for input(s): LIPASE, AMYLASE in the last 168 hours. Recent Labs  Lab 02/20/24 0911  AMMONIA 17   Coagulation Profile: Recent Labs  Lab 02/19/24 2010  INR 1.4*   Cardiac Enzymes: No results for input(s): CKTOTAL, CKMB, CKMBINDEX, TROPONINI in the last 168 hours. BNP (last 3 results) No results for input(s): PROBNP in the last 8760 hours. HbA1C: Recent Labs    02/20/24 0322  HGBA1C 10.1*   CBG: Recent Labs  Lab 02/21/24 0234 02/21/24 0333 02/21/24 0431 02/21/24 0555 02/21/24 0756  GLUCAP 158* 146* 149* 133* 148*   Lipid Profile: No results for input(s): CHOL, HDL, LDLCALC, TRIG, CHOLHDL, LDLDIRECT in the last 72 hours. Thyroid  Function Tests: Recent Labs    02/20/24 0500  TSH 1.125   Anemia Panel: Recent Labs    02/20/24 0500   VITAMINB12 1,917*   Sepsis Labs: Recent Labs  Lab 02/19/24 2057 02/20/24 0322 02/20/24 0858 02/20/24 1309  PROCALCITON  --   --   --  3.72  LATICACIDVEN 1.8 2.1* 2.6* 2.3*    Recent Results (from the past 240 hours)  Blood Culture (routine x 2)     Status: None (Preliminary result)   Collection Time: 02/19/24  8:09 PM   Specimen: BLOOD RIGHT FOREARM  Result Value Ref Range Status   Specimen Description BLOOD RIGHT FOREARM  Final   Special Requests   Final    BOTTLES DRAWN AEROBIC AND ANAEROBIC Blood Culture adequate volume   Culture   Final    NO GROWTH 2 DAYS Performed at [facility], [address] [place], [place] [address]    Report Status PENDING  Incomplete  Blood Culture (routine x 2)     Status: None (Preliminary result)   Collection Time: 02/19/24  8:51 PM   Specimen: BLOOD  Result Value Ref Range Status   Specimen Description BLOOD RIGHT ANTECUBITAL  Final   Special Requests   Final    BOTTLES DRAWN AEROBIC AND ANAEROBIC Blood Culture adequate volume   Culture   Final    NO GROWTH 2 DAYS Performed at [facility], [address] [place], [place] [address]    Report Status PENDING  Incomplete  Resp panel by RT-PCR (RSV, Flu A&B, Covid) Anterior Nasal Swab     Status: None   Collection Time: 02/19/24  8:52 PM   Specimen: Anterior Nasal Swab  Result Value Ref Range Status   SARS Coronavirus 2 by RT PCR NEGATIVE NEGATIVE Final   Influenza A by PCR NEGATIVE NEGATIVE Final   Influenza B by PCR NEGATIVE NEGATIVE Final    Comment: (NOTE) The Xpert Xpress SARS-CoV-2/FLU/RSV plus assay is intended as an aid in the diagnosis of influenza from Nasopharyngeal swab specimens and should not be used as a sole  basis for treatment. Nasal washings and aspirates are unacceptable for Xpert Xpress SARS-CoV-2/FLU/RSV testing.  Fact Sheet for Patients: BloggerCourse.com  Fact Sheet for Healthcare  Providers: SeriousBroker.it  This test is not yet approved or cleared by the [place]  FDA and has been authorized for detection and/or diagnosis of SARS-CoV-2 by FDA under an Emergency Use Authorization (EUA). This EUA will remain in effect (meaning this test can be used) for the duration of the COVID-19 declaration under Section 564(b)(1) of the Act, 21 U.S.C. section 360bbb-3(b)(1), unless the authorization is terminated or revoked.     Resp Syncytial Virus by PCR NEGATIVE NEGATIVE Final    Comment: (NOTE) Fact Sheet for Patients: BloggerCourse.com  Fact Sheet for Healthcare Providers: SeriousBroker.it  This test is not yet approved or cleared by the [place]  FDA and has been authorized for detection and/or diagnosis of SARS-CoV-2 by FDA under an Emergency Use Authorization (EUA). This EUA will remain in effect (meaning this test can be used) for the duration of the COVID-19 declaration under Section 564(b)(1) of the Act, 21 U.S.C. section 360bbb-3(b)(1), unless the authorization is terminated or revoked.  Performed at [facility], [address] [place], [place] [address]   Respiratory (~20 pathogens) panel by PCR     Status: None   Collection Time: 02/20/24  3:40 AM   Specimen: Nasopharyngeal Swab; Respiratory  Result Value Ref Range Status   Adenovirus NOT DETECTED NOT DETECTED Final   Coronavirus 229E NOT DETECTED NOT DETECTED Final    Comment: (NOTE) The Coronavirus on the Respiratory Panel, DOES NOT test for the novel  Coronavirus (2019 nCoV)    Coronavirus HKU1 NOT DETECTED NOT DETECTED Final   Coronavirus NL63 NOT DETECTED NOT DETECTED Final   Coronavirus OC43 NOT DETECTED NOT DETECTED Final   Metapneumovirus NOT DETECTED NOT DETECTED Final   Rhinovirus / Enterovirus NOT DETECTED NOT DETECTED Final   Influenza A NOT DETECTED NOT DETECTED Final   Influenza B NOT DETECTED  NOT DETECTED Final   Parainfluenza Virus 1 NOT DETECTED NOT DETECTED Final   Parainfluenza Virus 2 NOT DETECTED NOT DETECTED Final   Parainfluenza Virus 3 NOT DETECTED NOT DETECTED Final   Parainfluenza Virus 4 NOT DETECTED NOT DETECTED Final   Respiratory Syncytial Virus NOT DETECTED NOT DETECTED Final   Bordetella pertussis NOT DETECTED NOT DETECTED Final   Bordetella Parapertussis NOT DETECTED NOT DETECTED Final   Chlamydophila pneumoniae NOT DETECTED NOT DETECTED Final   Mycoplasma pneumoniae NOT DETECTED NOT DETECTED Final    Comment: Performed at [facility], [address] [place], [place] [address]  MRSA Next Gen by PCR, Nasal     Status: None   Collection Time: 02/20/24  3:40 AM   Specimen: Nasal Mucosa; Nasal Swab  Result Value Ref Range Status   MRSA by PCR Next Gen NOT DETECTED NOT DETECTED Final    Comment: (NOTE) The GeneXpert MRSA Assay (FDA approved for NASAL specimens only), is one component of a comprehensive MRSA colonization surveillance program. It is not intended to diagnose MRSA infection nor to guide or monitor treatment for MRSA infections. Test performance is not FDA approved in patients less than 38 years old. Performed at [facility], [address] [place], [place] [address]          Radiology Studies: ECHOCARDIOGRAM COMPLETE Result Date: 02/20/2024    ECHOCARDIOGRAM REPORT   Patient Name:   Matthew Calderon Date of Exam: 02/20/2024 Medical Rec #:  [id number]  Height:       59.0 in Accession #:    [id number]     Weight:       154.0 lb Date of Birth:  [birth year]      BSA:          1.650 m Patient Age:    89 years       BP:           107/60 mmHg Patient Gender: M              HR:           87 bpm. Exam Location:  Inpatient Procedure: 2D Echo, Color Doppler and Cardiac Doppler (Both Spectral and Color            Flow Doppler were utilized during procedure). Indications:    Cardiomyopathy I42.9  History:        Patient has prior history of  Echocardiogram examinations, most                 recent 01/12/2020. Cardiomyopathy; Risk Factors:Diabetes.  Sonographer:    BERNARDA ROCKS Referring Phys: Naveyah Iacovelli C Zack Crager IMPRESSIONS  1. Left ventricular ejection fraction, by estimation, is 60 to 65%. The left ventricle has normal function. The left ventricle has no regional wall motion abnormalities. Left ventricular diastolic parameters are consistent with Grade I diastolic dysfunction (impaired relaxation).  2. Right ventricular systolic function is normal. The right ventricular size is normal. Tricuspid regurgitation signal is inadequate for assessing PA pressure.  3. The mitral valve is degenerative. Trivial mitral valve regurgitation.  4. The aortic valve is tricuspid. Aortic valve regurgitation is trivial. Aortic valve sclerosis/calcification is present, without any evidence of aortic stenosis.  5. There is Moderate (Grade III) calcified plaque involving the aortic root.  6. The inferior vena cava is normal in size with <50% respiratory variability, suggesting right atrial pressure of 8 mmHg. Comparison(s): Changes from prior study are noted. 01/12/2020: LVEF 55-60%. FINDINGS  Left Ventricle: Left ventricular ejection fraction, by estimation, is 60 to 65%. The left ventricle has normal function. The left ventricle has no regional wall motion abnormalities. The left ventricular internal cavity size was normal in size. There is  no left ventricular hypertrophy. Left ventricular diastolic parameters are consistent with Grade I diastolic dysfunction (impaired relaxation). Indeterminate filling pressures. Right Ventricle: The right ventricular size is normal. No increase in right ventricular wall thickness. Right ventricular systolic function is normal. Tricuspid regurgitation signal is inadequate for assessing PA pressure. Left Atrium: Left atrial size was normal in size. Right Atrium: Right atrial size was normal in size. Pericardium: There is no evidence of  pericardial effusion. Presence of epicardial fat layer. Mitral Valve: The mitral valve is degenerative in appearance. Mild mitral annular calcification. Trivial mitral valve regurgitation. MV peak gradient, 4.9 mmHg. The mean mitral valve gradient is 2.0 mmHg. Tricuspid Valve: The tricuspid valve is grossly normal. Tricuspid valve regurgitation is trivial. Aortic Valve: The aortic valve is tricuspid. Aortic valve regurgitation is trivial. Aortic valve sclerosis/calcification is present, without any evidence of aortic stenosis. Aortic valve mean gradient measures 5.0 mmHg. Aortic valve peak gradient measures 10.4 mmHg. Aortic valve area, by VTI measures 2.17 cm. Pulmonic Valve: The pulmonic valve was grossly normal. Pulmonic valve regurgitation is trivial. Aorta: The aortic root and ascending aorta are structurally normal, with no evidence of dilitation. There is moderate (Grade III) calcified plaque involving the aortic root. Venous: The inferior vena cava is normal in size with less than  50% respiratory variability, suggesting right atrial pressure of 8 mmHg. IAS/Shunts: No atrial level shunt detected by color flow Doppler.  LEFT VENTRICLE PLAX 2D LVIDd:         4.40 cm      Diastology LVIDs:         3.10 cm      LV e' medial:    7.94 cm/s LV PW:         0.90 cm      LV E/e' medial:  12.8 LV IVS:        1.00 cm      LV e' lateral:   11.40 cm/s LVOT diam:     2.00 cm      LV E/e' lateral: 8.9 LV SV:         63 LV SV Index:   38 LVOT Area:     3.14 cm  LV Volumes (MOD) LV vol d, MOD A2C: 79.1 ml LV vol d, MOD A4C: 140.0 ml LV vol s, MOD A2C: 26.7 ml LV vol s, MOD A4C: 52.0 ml LV SV MOD A2C:     52.4 ml LV SV MOD A4C:     140.0 ml LV SV MOD BP:      72.0 ml RIGHT VENTRICLE RV Basal diam:  3.60 cm RV S prime:     14.10 cm/s TAPSE (M-mode): 1.9 cm LEFT ATRIUM             Index        RIGHT ATRIUM           Index LA diam:        3.70 cm 2.24 cm/m   RA Area:     10.90 cm LA Vol (A2C):   29.0 ml 17.57 ml/m  RA Volume:    20.10 ml  12.18 ml/m LA Vol (A4C):   34.2 ml 20.72 ml/m LA Biplane Vol: 31.6 ml 19.15 ml/m  AORTIC VALVE                     PULMONIC VALVE AV Area (Vmax):    2.01 cm      PV Vmax:       0.72 m/s AV Area (Vmean):   1.90 cm      PV Peak grad:  2.1 mmHg AV Area (VTI):     2.17 cm AV Vmax:           161.00 cm/s AV Vmean:          105.000 cm/s AV VTI:            0.290 m AV Peak Grad:      10.4 mmHg AV Mean Grad:      5.0 mmHg LVOT Vmax:         103.00 cm/s LVOT Vmean:        63.400 cm/s LVOT VTI:          0.200 m LVOT/AV VTI ratio: 0.69  AORTA Ao Root diam: 3.30 cm Ao Asc diam:  3.60 cm MITRAL VALVE MV Area (PHT): 2.95 cm     SHUNTS MV Area VTI:   1.94 cm     Systemic VTI:  0.20 m MV Peak grad:  4.9 mmHg     Systemic Diam: 2.00 cm MV Mean grad:  2.0 mmHg MV Vmax:       1.11 m/s MV Vmean:      73.4 cm/s MV Decel Time: 257 msec MV E velocity: 102.00 cm/s MV A  velocity: 111.00 cm/s MV E/A ratio:  0.92 Vinie Maxcy MD Electronically signed by Vinie Maxcy MD Signature Date/Time: 02/20/2024/3:20:49 PM    Final    CT CHEST ABDOMEN PELVIS W CONTRAST Result Date: 02/19/2024 CLINICAL DATA:  Sepsis Head trauma, recent fall w/ type 2 dens fx, sepsis EXAM: CT CHEST, ABDOMEN, AND PELVIS WITH CONTRAST TECHNIQUE: Multidetector CT imaging of the chest, abdomen and pelvis was performed following the standard protocol during bolus administration of intravenous contrast. RADIATION DOSE REDUCTION: This exam was performed according to the departmental dose-optimization program which includes automated exposure control, adjustment of the mA and/or kV according to patient size and/or use of iterative reconstruction technique. CONTRAST:  75mL OMNIPAQUE  IOHEXOL  350 MG/ML SOLN COMPARISON:  None Available. FINDINGS: CHEST: Cardiovascular: No aortic injury. The thoracic aorta is normal in caliber. The heart is normal in size. No significant pericardial effusion. Four-vessel coronary artery calcification. Aortic valve leaflet  calcification. Moderate severe atherosclerotic plaque. The main pulmonary artery is normal in caliber. No central pulmonary embolus. Mediastinum/Nodes: No pneumomediastinum. No mediastinal hematoma. The esophagus is unremarkable.  Small hiatal hernia. The thyroid  is unremarkable. The central airways are patent. No mediastinal, hilar, or axillary lymphadenopathy. Lungs/Pleura: Bilateral lower lobe patchy airspace opacities. No pulmonary nodule. No pulmonary mass. No pulmonary contusion or laceration. No pneumatocele formation. No pleural effusion. No pneumothorax. No hemothorax. Musculoskeletal/Chest wall: No chest wall mass. Old healed right rib fracture. No acute rib or sternal fracture. No spinal fracture. Severe left shoulder degenerative changes. ABDOMEN / PELVIS: Hepatobiliary: Not enlarged. No focal lesion. No laceration or subcapsular hematoma. Calcified gallstones within the gallbladder lumen. No gallbladder wall thickening or pericholecystic fluid. No biliary ductal dilatation. Pancreas: Diffusely atrophic. Otherwise normal pancreatic contour. No main pancreatic duct dilatation. Spleen: Not enlarged. No focal lesion. No laceration, subcapsular hematoma, or vascular injury. Splenule noted. Adrenals/Urinary Tract: No nodularity bilaterally. Bilateral kidneys enhance symmetrically. No hydronephrosis. No contusion, laceration, or subcapsular hematoma. No injury to the vascular structures or collecting systems. No hydroureter. The urinary bladder is unremarkable. On delayed imaging, there is no urothelial wall thickening and there are no filling defects in the opacified portions of the bilateral collecting systems or ureters. Stomach/Bowel: No small or large bowel wall thickening or dilatation. Colonic diverticulosis. The appendix is unremarkable. Vasculature/Lymphatics: No abdominal aorta or iliac aneurysm. No active contrast extravasation or pseudoaneurysm. No abdominal, pelvic, inguinal lymphadenopathy.  Reproductive: Prostate is enlarged measuring up to 5 cm. Other: No simple free fluid ascites. No pneumoperitoneum. No hemoperitoneum. No mesenteric hematoma identified. No organized fluid collection. Musculoskeletal: No significant soft tissue hematoma. No acute pelvic fracture. No spinal fracture. Multilevel severe degenerative changes of the spine. Mild retrolisthesis of L5 on S1. Total bilateral hip arthroplasty partially visualized. Other ports and devices: None. IMPRESSION: 1. Bilateral lower lobe patchy airspace opacities. Finding may represent atelectasis with superimposed infection. 2. No acute intrathoracic, intra-abdominal, intrapelvic traumatic injury. 3. No acute fracture or traumatic malalignment of the thoracic or lumbar spine. 4. Aortic Atherosclerosis (ICD10-I70.0) including four-vessel coronary artery and aortic valve leaflet calcifications-correlate for aortic stenosis. Cholelithiasis with no CT evidence of acute cholecystitis. Colonic diverticulosis with no acute diverticulitis. Small hiatal hernia. Prostatomegaly. Electronically Signed   By: Morgane  Naveau M.D.   On: 02/19/2024 23:09   CT Head Wo Contrast Result Date: 02/19/2024 CLINICAL DATA:  Head trauma, minor (Age >= 65y); recent fall w type 2 dens fx EXAM: CT HEAD WITHOUT CONTRAST CT CERVICAL SPINE WITHOUT CONTRAST TECHNIQUE: Multidetector CT imaging of the head and  cervical spine was performed following the standard protocol without intravenous contrast. Multiplanar CT image reconstructions of the cervical spine were also generated. RADIATION DOSE REDUCTION: This exam was performed according to the departmental dose-optimization program which includes automated exposure control, adjustment of the mA and/or kV according to patient size and/or use of iterative reconstruction technique. COMPARISON:  CT head and C-spine 02/18/2024 FINDINGS: CT HEAD FINDINGS Brain: Cerebral ventricle sizes are concordant with the degree of cerebral volume  loss. Trace patchy and confluent areas of decreased attenuation are noted throughout the deep and periventricular white matter of the cerebral hemispheres bilaterally, compatible with chronic microvascular ischemic disease. No evidence of large-territorial acute infarction. No parenchymal hemorrhage. No mass lesion. No extra-axial collection. No mass effect or midline shift. No hydrocephalus. Basilar cisterns are patent. Vascular: No hyperdense vessel. Atherosclerotic calcifications are present within the cavernous internal carotid and vertebral arteries. Skull: No acute fracture or focal lesion. Sinuses/Orbits: Paranasal sinuses and mastoid air cells are clear. Bilateral lens replacement. Otherwise the orbits are unremarkable. Other: None. CT CERVICAL SPINE FINDINGS Alignment: Stable grade 1 anterolisthesis of C5 on C6 and C6 on C7. Skull base and vertebrae: Redemonstration of acute C2 type 2 dens fracture slightly more displaced with a 2 mm distraction. Multilevel severe degenerative changes of the spine with associated moderate to severe osseous neural foraminal stenosis. No severe osseous central canal stenosis. No aggressive appearing focal osseous lesion or focal pathologic process. Soft tissues and spinal canal: No prevertebral fluid or swelling. No visible canal hematoma. Upper chest: Unremarkable. Other: None. IMPRESSION: 1.  No acute intracranial abnormality. 2. Redemonstration of acute C2 type 2 dens fracture slightly more displaced with a 2 mm distraction. Electronically Signed   By: Morgane  Naveau M.D.   On: 02/19/2024 22:59   CT Cervical Spine Wo Contrast Result Date: 02/19/2024 CLINICAL DATA:  Head trauma, minor (Age >= 65y); recent fall w type 2 dens fx EXAM: CT HEAD WITHOUT CONTRAST CT CERVICAL SPINE WITHOUT CONTRAST TECHNIQUE: Multidetector CT imaging of the head and cervical spine was performed following the standard protocol without intravenous contrast. Multiplanar CT image reconstructions  of the cervical spine were also generated. RADIATION DOSE REDUCTION: This exam was performed according to the departmental dose-optimization program which includes automated exposure control, adjustment of the mA and/or kV according to patient size and/or use of iterative reconstruction technique. COMPARISON:  CT head and C-spine 02/18/2024 FINDINGS: CT HEAD FINDINGS Brain: Cerebral ventricle sizes are concordant with the degree of cerebral volume loss. Trace patchy and confluent areas of decreased attenuation are noted throughout the deep and periventricular white matter of the cerebral hemispheres bilaterally, compatible with chronic microvascular ischemic disease. No evidence of large-territorial acute infarction. No parenchymal hemorrhage. No mass lesion. No extra-axial collection. No mass effect or midline shift. No hydrocephalus. Basilar cisterns are patent. Vascular: No hyperdense vessel. Atherosclerotic calcifications are present within the cavernous internal carotid and vertebral arteries. Skull: No acute fracture or focal lesion. Sinuses/Orbits: Paranasal sinuses and mastoid air cells are clear. Bilateral lens replacement. Otherwise the orbits are unremarkable. Other: None. CT CERVICAL SPINE FINDINGS Alignment: Stable grade 1 anterolisthesis of C5 on C6 and C6 on C7. Skull base and vertebrae: Redemonstration of acute C2 type 2 dens fracture slightly more displaced with a 2 mm distraction. Multilevel severe degenerative changes of the spine with associated moderate to severe osseous neural foraminal stenosis. No severe osseous central canal stenosis. No aggressive appearing focal osseous lesion or focal pathologic process. Soft tissues and spinal canal:  No prevertebral fluid or swelling. No visible canal hematoma. Upper chest: Unremarkable. Other: None. IMPRESSION: 1.  No acute intracranial abnormality. 2. Redemonstration of acute C2 type 2 dens fracture slightly more displaced with a 2 mm distraction.  Electronically Signed   By: Morgane  Naveau M.D.   On: 02/19/2024 22:59   DG Chest Port 1 View Result Date: 02/19/2024 CLINICAL DATA:  Questionable sepsis - evaluate for abnormality EXAM: PORTABLE CHEST 1 VIEW COMPARISON:  Chest x-ray 02/18/2024 FINDINGS: The heart and mediastinal contours are unchanged. Atherosclerotic plaque. Low lung volumes. No focal consolidation. No pulmonary edema. No pleural effusion. No pneumothorax. No acute osseous abnormality.  Old healed right rib fracture. IMPRESSION: 1. No active disease. 2.  Aortic Atherosclerosis (ICD10-I70.0). Electronically Signed   By: Morgane  Naveau M.D.   On: 02/19/2024 21:21           LOS: 1 day   Time spent= 35 mins    Burgess JAYSON Dare, MD [facility]  If 7PM-7AM, please contact night-coverage  02/21/2024, 9:49 AM

## 2024-02-22 DIAGNOSIS — J189 Pneumonia, unspecified organism: Secondary | ICD-10-CM | POA: Diagnosis not present

## 2024-02-22 DIAGNOSIS — I480 Paroxysmal atrial fibrillation: Secondary | ICD-10-CM | POA: Diagnosis not present

## 2024-02-22 LAB — GLUCOSE, CAPILLARY
Glucose-Capillary: 130 mg/dL — ABNORMAL HIGH (ref 70–99)
Glucose-Capillary: 139 mg/dL — ABNORMAL HIGH (ref 70–99)
Glucose-Capillary: 112 mg/dL — ABNORMAL HIGH (ref 70–99)
Glucose-Capillary: 107 mg/dL — ABNORMAL HIGH (ref 70–99)
Glucose-Capillary: 114 mg/dL — ABNORMAL HIGH (ref 70–99)

## 2024-02-22 LAB — CBC
HCT: 37 % — ABNORMAL LOW (ref 39.0–52.0)
Hemoglobin: 12.2 g/dL — ABNORMAL LOW (ref 13.0–17.0)
MCH: 29.4 pg (ref 26.0–34.0)
MCHC: 33 g/dL (ref 30.0–36.0)
MCV: 89.2 fL (ref 80.0–100.0)
Platelets: 143 K/uL — ABNORMAL LOW (ref 150–400)
RBC: 4.15 MIL/uL — ABNORMAL LOW (ref 4.22–5.81)
RDW: 13.7 % (ref 11.5–15.5)
WBC: 9.1 K/uL (ref 4.0–10.5)
nRBC: 0 % (ref 0.0–0.2)

## 2024-02-22 LAB — HEPARIN LEVEL (UNFRACTIONATED): Heparin Unfractionated: 0.38 [IU]/mL (ref 0.30–0.70)

## 2024-02-22 MED ORDER — VITAMIN D 25 MCG (1000 UNIT) PO TABS
1000.0000 [IU] | ORAL_TABLET | Freq: Every day | ORAL | Status: DC
  Administered 2024-02-27 – 2024-03-01 (×4): 1000 [IU] via ORAL
  Filled 2024-02-22 (×5): qty 1

## 2024-02-22 NOTE — Plan of Care (Signed)

## 2024-02-22 NOTE — Progress Notes (Signed)
 PROGRESS NOTE    [name]  FMW:[id number] DOB: 10-30-1932 DOA: 02/19/2024 PCP: Caleen Dirks, MD    Brief Narrative:   89 year old with history of CAD, CHF, DM2, HTN, HLD, OSA, disorder, recurrent falls comes to the ED from [facility] came to the ED on 8/27 from a ground-level fall found to have type II dens fracture seen by neurosurgery and placed in hard collar with plans to follow-up outpatient in 2 weeks.  But now brought back to the hospital due to concerns of fever and change in mental status.  COVID/RSV/flu were negative.  CT chest abdomen pelvis shows concerns of bilateral patchy opacity, CT head negative for acute pathology, CT cervical spine is also negative.  Assessment & Plan:  Principal Problem:   CAP (community acquired pneumonia) Active Problems:   Sepsis (HCC)     Sepsis, multifocal pneumonia -Concerns of multifocal pneumonia seen on CT chest - Change antibiotics to Rocephin  and azithromycin . - COVID/flu/RSV negative - Bronchodilators, I-S/flutter valve.  Supportive care   Recent ground-level fall complicated by type II dens fracture Repeat CT C-spine demonstrates slightly more displaced type II dens with 2 mm distraction. Clinically neurologically intact in all extremities Maintain hard collar all the time - Follow-up outpatient neurosurgery   Encephalopathy, metabolic suspected setting of underlying infection CT head with no acute intracranial abnormality.  Likely in the setting of infection.  B12 normal - TSH and ammonia are normal  Atrial fibrillation with RVR - From undergoing stress.  Currently on amiodarone  drip and heparin .  IV PRNs have been ordered - Cardiology team is following.   AGMA -> severe metabolic acidosis Diabetic ketoacidosis, resolved Hx of DM2 Currently in severe acidosis.  I suspect this could be related to his home [place].  Will currently hold this as I suspect this could be related to euglycemic DKA with elevated beta  hydroxybutyrate.  DKA now has resolved therefore transition to subcu insulin  along with sliding scale.  Will eventually determine his home regimen Home meds on hold.   Elevated troponin Coronary artery disease - Denies any chest pain, rising troponin.  CT showing evidence of 4 vessel disease.  Currently suspect demand ischemia.  Echocardiogram shows preserved EF, grade 3 aortic root plaque.  Cardiology team following.  Defer further workup to their service.  Continue heparin  drip for now.  Acute kidney injury resolved Baseline creatinine 0.7 - 1, elevated to 1.5 on admission.  Continue IV fluids   Isolated elevation in T Bili  -Trend LFT   Incidental findings: Aortic atherosclerosis, 4 vessel CAD, aortic valve calcification Cholelithiasis, diverticulosis, small hiatal hernia, prostamegaly   CHF with EF 55% Hold home Entresto , carvedilol , SGLT2i     Hypertension Hold home antihypertensives in the setting of sepsis  Hyperlipidemia See CAD  OSA  Not on CPAP  Mood disorder  Continue home sertraline   Recurrent falls PT/OT-SNF  DVT prophylaxis: Lovenox .     Code Status: Full Code Family Communication:   Status is: Inpatient continue hospital stay for management of multiple medical issues as mentioned above   Subjective: Remains pleasantly confused.  Denies any complaints. Remains in atrial fibrillation with RVR Examination:  General exam: Appears calm and comfortable, hard c-collar in place.  Elderly frail Respiratory system: Even regularly irregular Cardiovascular system: S1 & S2 heard, RRR. No JVD, murmurs, rubs, gallops or clicks. No pedal edema. Gastrointestinal system: Abdomen is nondistended, soft and nontender. No organomegaly or masses felt. Normal bowel sounds heard. Central nervous system: Alert and oriented  to name only. No focal neurological deficits. Extremities: Symmetric 5 x 5 power. Skin: No rashes, lesions or ulcers Psychiatry: Judgement and insight  appear poor                Diet Orders (From admission, onward)     Start     Ordered   02/20/24 0857  Diet Carb Modified Fluid consistency: Thin; Room service appropriate? Yes  (Diabetes Ketoacidosis (DKA))  Diet effective now       Question Answer Comment  Diet-HS Snack? Nothing   Calorie Level Medium 1600-2000   Fluid consistency: Thin   Room service appropriate? Yes      02/20/24 0858            Objective: Vitals:   02/21/24 2025 02/22/24 0036 02/22/24 0407 02/22/24 0800  BP: 130/82 134/85 (!) 141/94 125/87  Pulse: (!) 140 (!) 136 (!) 139   Resp: 20 20 19 20   Temp: 97.6 F (36.4 C) (!) 97.4 F (36.3 C) 97.7 F (36.5 C) 97.7 F (36.5 C)  TempSrc: Oral Oral Oral Oral  SpO2: 97% 93% 95% 96%  Weight:   68.9 kg   Height:        Intake/Output Summary (Last 24 hours) at 02/22/2024 0928 Last data filed at 02/22/2024 0410 Gross per 24 hour  Intake 290 ml  Output 1350 ml  Net -1060 ml   Filed Weights   02/20/24 0329 02/21/24 0334 02/22/24 0407  Weight: 69.9 kg 72 kg 68.9 kg    Scheduled Meds:  aspirin   81 mg Oral Daily   atorvastatin   40 mg Oral QPM   brimonidine   1 drop Both Eyes BID   finasteride   5 mg Oral QPM   insulin  aspart  0-5 Units Subcutaneous QHS   insulin  aspart  0-9 Units Subcutaneous TID WC   insulin  glargine  15 Units Subcutaneous Daily   potassium chloride   40 mEq Oral Once   sertraline   50 mg Oral QPM   sodium chloride  flush  3 mL Intravenous Q12H   tamsulosin   0.4 mg Oral QHS   timolol   1 drop Both Eyes BID   Continuous Infusions:  amiodarone  30 mg/hr (02/21/24 2033)   azithromycin  500 mg (02/21/24 0816)   cefTRIAXone  (ROCEPHIN )  IV 1 g (02/21/24 0832)   heparin  1,200 Units/hr (02/21/24 1057)   insulin  Stopped (02/21/24 9072)    Nutritional status     Body mass index is 30.68 kg/m.  Data Reviewed:   CBC: Recent Labs  Lab 02/18/24 1146 02/19/24 2010 02/20/24 0322 02/21/24 0313  WBC 7.9 16.6* 20.3* 10.1   NEUTROABS 5.5 14.3*  --   --   HGB 14.1 14.2 12.7* 11.2*  HCT 43.0 44.3 41.0 33.4*  MCV 88.3 93.7 95.6 89.1  PLT 163 213 192 141*   Basic Metabolic Panel: Recent Labs  Lab 02/20/24 0600 02/20/24 0858 02/20/24 1309 02/20/24 1341 02/20/24 1620 02/20/24 2051 02/21/24 0313  NA 140   < > 142 141 140 139 139  K 4.6   < > 4.3 3.7 3.8 3.5 3.4*  CL 109   < > 112* 115* 110 108 111  CO2 <7*   < > 13* 15* 17* 17* 18*  GLUCOSE 217*   < > 256* 247* 191* 142* 141*  BUN 26*   < > 25* 23 22 20 17   CREATININE 1.60*   < > 1.36* 1.29* 1.18 1.07 0.92  CALCIUM  8.4*   < > 8.1* 7.9* 8.1* 8.2* 7.7*  MG 1.9  --   --   --   --   --   --   PHOS 4.7*  --   --   --   --   --   --    < > = values in this interval not displayed.   GFR: Estimated Creatinine Clearance: 43.3 mL/min (by C-G formula based on SCr of 0.92 mg/dL). Liver Function Tests: Recent Labs  Lab 02/18/24 1146 02/19/24 2010 02/20/24 1309  AST 13* 17 19  ALT 12 15 10   ALKPHOS 113 90 78  BILITOT 0.7 1.8* 1.2  PROT 6.6 6.9 6.1*  ALBUMIN 4.0 3.5 3.0*   No results for input(s): LIPASE, AMYLASE in the last 168 hours. Recent Labs  Lab 02/20/24 0911  AMMONIA 17   Coagulation Profile: Recent Labs  Lab 02/19/24 2010  INR 1.4*   Cardiac Enzymes: No results for input(s): CKTOTAL, CKMB, CKMBINDEX, TROPONINI in the last 168 hours. BNP (last 3 results) No results for input(s): PROBNP in the last 8760 hours. HbA1C: Recent Labs    02/20/24 0322  HGBA1C 10.1*   CBG: Recent Labs  Lab 02/21/24 1116 02/21/24 1556 02/21/24 2156 02/22/24 0636 02/22/24 0645  GLUCAP 179* 124* 147* 130* 139*   Lipid Profile: No results for input(s): CHOL, HDL, LDLCALC, TRIG, CHOLHDL, LDLDIRECT in the last 72 hours. Thyroid  Function Tests: Recent Labs    02/20/24 0500  TSH 1.125   Anemia Panel: Recent Labs    02/20/24 0500  VITAMINB12 1,917*   Sepsis Labs: Recent Labs  Lab 02/19/24 2057 02/20/24 0322  02/20/24 0858 02/20/24 1309  PROCALCITON  --   --   --  3.72  LATICACIDVEN 1.8 2.1* 2.6* 2.3*    Recent Results (from the past 240 hours)  Blood Culture (routine x 2)     Status: None (Preliminary result)   Collection Time: 02/19/24  8:09 PM   Specimen: BLOOD RIGHT FOREARM  Result Value Ref Range Status   Specimen Description BLOOD RIGHT FOREARM  Final   Special Requests   Final    BOTTLES DRAWN AEROBIC AND ANAEROBIC Blood Culture adequate volume   Culture   Final    NO GROWTH 3 DAYS Performed at [facility], [address], [place], [place] [address]    Report Status PENDING  Incomplete  Blood Culture (routine x 2)     Status: None (Preliminary result)   Collection Time: 02/19/24  8:51 PM   Specimen: BLOOD  Result Value Ref Range Status   Specimen Description BLOOD RIGHT ANTECUBITAL  Final   Special Requests   Final    BOTTLES DRAWN AEROBIC AND ANAEROBIC Blood Culture adequate volume   Culture   Final    NO GROWTH 3 DAYS Performed at [facility], [address] [place], [place] [address]    Report Status PENDING  Incomplete  Resp panel by RT-PCR (RSV, Flu A&B, Covid) Anterior Nasal Swab     Status: None   Collection Time: 02/19/24  8:52 PM   Specimen: Anterior Nasal Swab  Result Value Ref Range Status   SARS Coronavirus 2 by RT PCR NEGATIVE NEGATIVE Final   Influenza A by PCR NEGATIVE NEGATIVE Final   Influenza B by PCR NEGATIVE NEGATIVE Final    Comment: (NOTE) The Xpert Xpress SARS-CoV-2/FLU/RSV plus assay is intended as an aid in the diagnosis of influenza from Nasopharyngeal swab specimens and should not be used as a sole basis for treatment. Nasal washings and  aspirates are unacceptable for Xpert Xpress SARS-CoV-2/FLU/RSV testing.  Fact Sheet for Patients: BloggerCourse.com  Fact Sheet for Healthcare Providers: SeriousBroker.it  This test is not yet approved or cleared by the [place]  FDA  and has been authorized for detection and/or diagnosis of SARS-CoV-2 by FDA under an Emergency Use Authorization (EUA). This EUA will remain in effect (meaning this test can be used) for the duration of the COVID-19 declaration under Section 564(b)(1) of the Act, 21 U.S.C. section 360bbb-3(b)(1), unless the authorization is terminated or revoked.     Resp Syncytial Virus by PCR NEGATIVE NEGATIVE Final    Comment: (NOTE) Fact Sheet for Patients: BloggerCourse.com  Fact Sheet for Healthcare Providers: SeriousBroker.it  This test is not yet approved or cleared by the [place]  FDA and has been authorized for detection and/or diagnosis of SARS-CoV-2 by FDA under an Emergency Use Authorization (EUA). This EUA will remain in effect (meaning this test can be used) for the duration of the COVID-19 declaration under Section 564(b)(1) of the Act, 21 U.S.C. section 360bbb-3(b)(1), unless the authorization is terminated or revoked.  Performed at [facility], [address] [place], [place] [address]   Respiratory (~20 pathogens) panel by PCR     Status: None   Collection Time: 02/20/24  3:40 AM   Specimen: Nasopharyngeal Swab; Respiratory  Result Value Ref Range Status   Adenovirus NOT DETECTED NOT DETECTED Final   Coronavirus 229E NOT DETECTED NOT DETECTED Final    Comment: (NOTE) The Coronavirus on the Respiratory Panel, DOES NOT test for the novel  Coronavirus (2019 nCoV)    Coronavirus HKU1 NOT DETECTED NOT DETECTED Final   Coronavirus NL63 NOT DETECTED NOT DETECTED Final   Coronavirus OC43 NOT DETECTED NOT DETECTED Final   Metapneumovirus NOT DETECTED NOT DETECTED Final   Rhinovirus / Enterovirus NOT DETECTED NOT DETECTED Final   Influenza A NOT DETECTED NOT DETECTED Final   Influenza B NOT DETECTED NOT DETECTED Final   Parainfluenza Virus 1 NOT DETECTED NOT DETECTED Final   Parainfluenza Virus 2 NOT DETECTED NOT  DETECTED Final   Parainfluenza Virus 3 NOT DETECTED NOT DETECTED Final   Parainfluenza Virus 4 NOT DETECTED NOT DETECTED Final   Respiratory Syncytial Virus NOT DETECTED NOT DETECTED Final   Bordetella pertussis NOT DETECTED NOT DETECTED Final   Bordetella Parapertussis NOT DETECTED NOT DETECTED Final   Chlamydophila pneumoniae NOT DETECTED NOT DETECTED Final   Mycoplasma pneumoniae NOT DETECTED NOT DETECTED Final    Comment: Performed at [facility] Lab, [address], [place], [place] [address]  MRSA Next Gen by PCR, Nasal     Status: None   Collection Time: 02/20/24  3:40 AM   Specimen: Nasal Mucosa; Nasal Swab  Result Value Ref Range Status   MRSA by PCR Next Gen NOT DETECTED NOT DETECTED Final    Comment: (NOTE) The GeneXpert MRSA Assay (FDA approved for NASAL specimens only), is one component of a comprehensive MRSA colonization surveillance program. It is not intended to diagnose MRSA infection nor to guide or monitor treatment for MRSA infections. Test performance is not FDA approved in patients less than 62 years old. Performed at [facility] Lab, [address], [place], [place] [address]          Radiology Studies: ECHOCARDIOGRAM COMPLETE Result Date: 02/20/2024    ECHOCARDIOGRAM REPORT   Patient Name:   Matthew Calderon Date of Exam: 02/20/2024 Medical Rec #:  [id number]      Height:  59.0 in Accession #:    [id number]     Weight:       154.0 lb Date of Birth:  01-23-1933      BSA:          1.650 m Patient Age:    89 years       BP:           107/60 mmHg Patient Gender: M              HR:           87 bpm. Exam Location:  Inpatient Procedure: 2D Echo, Color Doppler and Cardiac Doppler (Both Spectral and Color            Flow Doppler were utilized during procedure). Indications:    Cardiomyopathy I42.9  History:        Patient has prior history of Echocardiogram examinations, most                 recent 01/12/2020. Cardiomyopathy; Risk Factors:Diabetes.  Sonographer:     BERNARDA ROCKS Referring Phys: Cyrilla Durkin C Avo Schlachter IMPRESSIONS  1. Left ventricular ejection fraction, by estimation, is 60 to 65%. The left ventricle has normal function. The left ventricle has no regional wall motion abnormalities. Left ventricular diastolic parameters are consistent with Grade I diastolic dysfunction (impaired relaxation).  2. Right ventricular systolic function is normal. The right ventricular size is normal. Tricuspid regurgitation signal is inadequate for assessing PA pressure.  3. The mitral valve is degenerative. Trivial mitral valve regurgitation.  4. The aortic valve is tricuspid. Aortic valve regurgitation is trivial. Aortic valve sclerosis/calcification is present, without any evidence of aortic stenosis.  5. There is Moderate (Grade III) calcified plaque involving the aortic root.  6. The inferior vena cava is normal in size with <50% respiratory variability, suggesting right atrial pressure of 8 mmHg. Comparison(s): Changes from prior study are noted. 01/12/2020: LVEF 55-60%. FINDINGS  Left Ventricle: Left ventricular ejection fraction, by estimation, is 60 to 65%. The left ventricle has normal function. The left ventricle has no regional wall motion abnormalities. The left ventricular internal cavity size was normal in size. There is  no left ventricular hypertrophy. Left ventricular diastolic parameters are consistent with Grade I diastolic dysfunction (impaired relaxation). Indeterminate filling pressures. Right Ventricle: The right ventricular size is normal. No increase in right ventricular wall thickness. Right ventricular systolic function is normal. Tricuspid regurgitation signal is inadequate for assessing PA pressure. Left Atrium: Left atrial size was normal in size. Right Atrium: Right atrial size was normal in size. Pericardium: There is no evidence of pericardial effusion. Presence of epicardial fat layer. Mitral Valve: The mitral valve is degenerative in appearance. Mild mitral  annular calcification. Trivial mitral valve regurgitation. MV peak gradient, 4.9 mmHg. The mean mitral valve gradient is 2.0 mmHg. Tricuspid Valve: The tricuspid valve is grossly normal. Tricuspid valve regurgitation is trivial. Aortic Valve: The aortic valve is tricuspid. Aortic valve regurgitation is trivial. Aortic valve sclerosis/calcification is present, without any evidence of aortic stenosis. Aortic valve mean gradient measures 5.0 mmHg. Aortic valve peak gradient measures 10.4 mmHg. Aortic valve area, by VTI measures 2.17 cm. Pulmonic Valve: The pulmonic valve was grossly normal. Pulmonic valve regurgitation is trivial. Aorta: The aortic root and ascending aorta are structurally normal, with no evidence of dilitation. There is moderate (Grade III) calcified plaque involving the aortic root. Venous: The inferior vena cava is normal in size with less than 50% respiratory variability, suggesting right atrial pressure  of 8 mmHg. IAS/Shunts: No atrial level shunt detected by color flow Doppler.  LEFT VENTRICLE PLAX 2D LVIDd:         4.40 cm      Diastology LVIDs:         3.10 cm      LV e' medial:    7.94 cm/s LV PW:         0.90 cm      LV E/e' medial:  12.8 LV IVS:        1.00 cm      LV e' lateral:   11.40 cm/s LVOT diam:     2.00 cm      LV E/e' lateral: 8.9 LV SV:         63 LV SV Index:   38 LVOT Area:     3.14 cm  LV Volumes (MOD) LV vol d, MOD A2C: 79.1 ml LV vol d, MOD A4C: 140.0 ml LV vol s, MOD A2C: 26.7 ml LV vol s, MOD A4C: 52.0 ml LV SV MOD A2C:     52.4 ml LV SV MOD A4C:     140.0 ml LV SV MOD BP:      72.0 ml RIGHT VENTRICLE RV Basal diam:  3.60 cm RV S prime:     14.10 cm/s TAPSE (M-mode): 1.9 cm LEFT ATRIUM             Index        RIGHT ATRIUM           Index LA diam:        3.70 cm 2.24 cm/m   RA Area:     10.90 cm LA Vol (A2C):   29.0 ml 17.57 ml/m  RA Volume:   20.10 ml  12.18 ml/m LA Vol (A4C):   34.2 ml 20.72 ml/m LA Biplane Vol: 31.6 ml 19.15 ml/m  AORTIC VALVE                      PULMONIC VALVE AV Area (Vmax):    2.01 cm      PV Vmax:       0.72 m/s AV Area (Vmean):   1.90 cm      PV Peak grad:  2.1 mmHg AV Area (VTI):     2.17 cm AV Vmax:           161.00 cm/s AV Vmean:          105.000 cm/s AV VTI:            0.290 m AV Peak Grad:      10.4 mmHg AV Mean Grad:      5.0 mmHg LVOT Vmax:         103.00 cm/s LVOT Vmean:        63.400 cm/s LVOT VTI:          0.200 m LVOT/AV VTI ratio: 0.69  AORTA Ao Root diam: 3.30 cm Ao Asc diam:  3.60 cm MITRAL VALVE MV Area (PHT): 2.95 cm     SHUNTS MV Area VTI:   1.94 cm     Systemic VTI:  0.20 m MV Peak grad:  4.9 mmHg     Systemic Diam: 2.00 cm MV Mean grad:  2.0 mmHg MV Vmax:       1.11 m/s MV Vmean:      73.4 cm/s MV Decel Time: 257 msec MV E velocity: 102.00 cm/s MV A velocity: 111.00 cm/s MV E/A ratio:  0.92 Vinie Maxcy MD Electronically signed by Vinie Maxcy MD Signature Date/Time: 02/20/2024/3:20:49 PM    Final            LOS: 2 days   Time spent= 35 mins    Burgess JAYSON Dare, MD [facility]  If 7PM-7AM, please contact night-coverage  02/22/2024, 9:28 AM

## 2024-02-22 NOTE — Progress Notes (Signed)
 PHARMACY - ANTICOAGULATION CONSULT NOTE  Pharmacy Consult for IV heparin  Indication: chest pain/ACS  Allergies  Allergen Reactions   Alendronate Sodium     Other reaction(s): diarrhea   Penicillin G     Other reaction(s): Unknown   Penicillins Hives    DID THE REACTION INVOLVE: Swelling of the face/tongue/throat, SOB, or low BP? No Sudden or severe rash/hives, skin peeling, or the inside of the mouth or nose? No Did it require medical treatment? No When did it last happen?childhood allergy       If all above answers are "NO", may proceed with cephalosporin use.    Sulfa Antibiotics Hives   Sulfacetamide Sodium     Other reaction(s): Unknown    Patient Measurements: Height: 4' 11 (149.9 cm) Weight: 68.9 kg (151 lb 14.4 oz) IBW/kg (Calculated) : 47.7 HEPARIN  DW (KG): 62.7  Vital Signs: Temp: 97.7 F (36.5 C) (08/31 0800) Temp Source: Oral (08/31 0800) BP: 125/87 (08/31 0800) Pulse Rate: 139 (08/31 0407)  Labs: Recent Labs    02/19/24 2010 02/20/24 0322 02/20/24 0322 02/20/24 0600 02/20/24 1341 02/20/24 1620 02/20/24 2051 02/21/24 0313 02/21/24 0845 02/21/24 1219 02/21/24 1931 02/22/24 0943  HGB 14.2 12.7*  --   --   --   --   --  11.2*  --   --   --  12.2*  HCT 44.3 41.0  --   --   --   --   --  33.4*  --   --   --  37.0*  PLT 213 192  --   --   --   --   --  141*  --   --   --  143*  LABPROT 17.7*  --   --   --   --   --   --   --   --   --   --   --   INR 1.4*  --   --   --   --   --   --   --   --   --   --   --   HEPARINUNFRC  --   --    < >  --   --   --  <0.10*  --  0.32  --  0.43 0.38  CREATININE 1.59*  --   --    < > 1.29* 1.18 1.07 0.92  --   --   --   --   TROPONINIHS  --  310*  --    < > 1,119*  --   --   --  512* 363*  --   --    < > = values in this interval not displayed.    Estimated Creatinine Clearance: 43.3 mL/min (by C-G formula based on SCr of 0.92 mg/dL).   Medical History: Past Medical History:  Diagnosis Date   Anemia    takes  iron pill   Anxiety    Arthritis    Depression    Diabetes mellitus without complication (HCC)    GERD (gastroesophageal reflux disease)    pepto   H/O hiatal hernia    Pneumonia    4 years ago   PONV (postoperative nausea and vomiting) 05/14/2019   Post-nasal drip    Prostate hyperplasia, benign localized, without urinary obstruction    Seasonal allergies    Sleep apnea    no CPAP    Medications:  Infusions:   amiodarone  30 mg/hr (02/22/24 1013)  azithromycin  500 mg (02/22/24 1004)   cefTRIAXone  (ROCEPHIN )  IV 1 g (02/22/24 1023)   heparin  1,200 Units/hr (02/22/24 1017)   insulin  Stopped (02/21/24 9072)    Assessment: 89 yo male admitted with falls/AMS, found with elevated troponins and pharmacy asked to dose IV heparin .  No known anticoagulants PTA.  -heparin  level= 0.38 and at goal on 1100 units/hr -he is now noted with afib  Goal of Therapy:  Heparin  level 0.3-0.7 units/ml Monitor platelets by anticoagulation protocol: Yes   Plan:  -Continue heparin  1200 units/hr -Daily heparin  level and CBC  Prentice Poisson, PharmD Clinical Pharmacist **Pharmacist phone directory can now be found on [email or web address] ([email or web address]).  Listed under [facility].    [facility] pharmacy phone numbers are listed on [email or web address]

## 2024-02-22 NOTE — Progress Notes (Signed)
 Progress Note  Patient Name: Matthew Calderon Date of Encounter: 02/22/2024 Primary Cardiologist: Lynwood Schilling, MD   Subjective   Converted to SR 937 AM Patient notes no symptoms. HE does nto that there are time that he doesn't remember things so well.  This correlates to his sundowning episodes.  Vital Signs    Vitals:   02/21/24 2025 02/22/24 0036 02/22/24 0407 02/22/24 0800  BP: 130/82 134/85 (!) 141/94 125/87  Pulse: (!) 140 (!) 136 (!) 139   Resp: 20 20 19 20   Temp: 97.6 F (36.4 C) (!) 97.4 F (36.3 C) 97.7 F (36.5 C) 97.7 F (36.5 C)  TempSrc: Oral Oral Oral Oral  SpO2: 97% 93% 95% 96%  Weight:   68.9 kg   Height:        Intake/Output Summary (Last 24 hours) at 02/22/2024 9177 Last data filed at 02/22/2024 0410 Gross per 24 hour  Intake 290 ml  Output 1350 ml  Net -1060 ml   Filed Weights   02/20/24 0329 02/21/24 0334 02/22/24 0407  Weight: 69.9 kg 72 kg 68.9 kg    Physical Exam   GEN: Conversant Neck: neck collar Cardiac: RRR, no murmurs, rubs, or gallops.  GI: Soft, nontender, non-distended  MS: No edema  Labs   Telemetry: SR 937 AM from AF RVR   Chemistry Recent Labs  Lab 02/18/24 1146 02/19/24 2010 02/20/24 0600 02/20/24 1309 02/20/24 1341 02/20/24 1620 02/20/24 2051 02/21/24 0313  NA 141 141   < > 142   < > 140 139 139  K 4.4 4.9   < > 4.3   < > 3.8 3.5 3.4*  CL 105 109   < > 112*   < > 110 108 111  CO2 19* 10*   < > 13*   < > 17* 17* 18*  GLUCOSE 176* 190*   < > 256*   < > 191* 142* 141*  BUN 28* 26*   < > 25*   < > 22 20 17   CREATININE 1.02 1.59*   < > 1.36*   < > 1.18 1.07 0.92  CALCIUM  9.1 8.9   < > 8.1*   < > 8.1* 8.2* 7.7*  PROT 6.6 6.9  --  6.1*  --   --   --   --   ALBUMIN 4.0 3.5  --  3.0*  --   --   --   --   AST 13* 17  --  19  --   --   --   --   ALT 12 15  --  10  --   --   --   --   ALKPHOS 113 90  --  78  --   --   --   --   BILITOT 0.7 1.8*  --  1.2  --   --   --   --   GFRNONAA >60 41*   < > 50*   < > 59*  >60 >60  ANIONGAP 17* 22*   < > 17*   < > 13 14 10    < > = values in this interval not displayed.     Hematology Recent Labs  Lab 02/19/24 2010 02/20/24 0322 02/21/24 0313  WBC 16.6* 20.3* 10.1  RBC 4.73 4.29 3.75*  HGB 14.2 12.7* 11.2*  HCT 44.3 41.0 33.4*  MCV 93.7 95.6 89.1  MCH 30.0 29.6 29.9  MCHC 32.1 31.0 33.5  RDW 13.7 14.0 13.8  PLT  213 192 141*      Cardiac Studies   Cardiac Studies & Procedures   ______________________________________________________________________________________________     ECHOCARDIOGRAM  ECHOCARDIOGRAM COMPLETE 02/20/2024  Narrative ECHOCARDIOGRAM REPORT    Patient Name:   Matthew Calderon Date of Exam: 02/20/2024 Medical Rec #:  [id number]      Height:       59.0 in Accession #:    [id number]     Weight:       154.0 lb Date of Birth:  1932/12/05      BSA:          1.650 m Patient Age:    89 years       BP:           107/60 mmHg Patient Gender: M              HR:           87 bpm. Exam Location:  Inpatient  Procedure: 2D Echo, Color Doppler and Cardiac Doppler (Both Spectral and Color Flow Doppler were utilized during procedure).  Indications:    Cardiomyopathy I42.9  History:        Patient has prior history of Echocardiogram examinations, most recent 01/12/2020. Cardiomyopathy; Risk Factors:Diabetes.  Sonographer:    BERNARDA ROCKS Referring Phys: ANKIT C AMIN  IMPRESSIONS   1. Left ventricular ejection fraction, by estimation, is 60 to 65%. The left ventricle has normal function. The left ventricle has no regional wall motion abnormalities. Left ventricular diastolic parameters are consistent with Grade I diastolic dysfunction (impaired relaxation). 2. Right ventricular systolic function is normal. The right ventricular size is normal. Tricuspid regurgitation signal is inadequate for assessing PA pressure. 3. The mitral valve is degenerative. Trivial mitral valve regurgitation. 4. The aortic valve is tricuspid. Aortic  valve regurgitation is trivial. Aortic valve sclerosis/calcification is present, without any evidence of aortic stenosis. 5. There is Moderate (Grade III) calcified plaque involving the aortic root. 6. The inferior vena cava is normal in size with <50% respiratory variability, suggesting right atrial pressure of 8 mmHg.  Comparison(s): Changes from prior study are noted. 01/12/2020: LVEF 55-60%.  FINDINGS Left Ventricle: Left ventricular ejection fraction, by estimation, is 60 to 65%. The left ventricle has normal function. The left ventricle has no regional wall motion abnormalities. The left ventricular internal cavity size was normal in size. There is no left ventricular hypertrophy. Left ventricular diastolic parameters are consistent with Grade I diastolic dysfunction (impaired relaxation). Indeterminate filling pressures.  Right Ventricle: The right ventricular size is normal. No increase in right ventricular wall thickness. Right ventricular systolic function is normal. Tricuspid regurgitation signal is inadequate for assessing PA pressure.  Left Atrium: Left atrial size was normal in size.  Right Atrium: Right atrial size was normal in size.  Pericardium: There is no evidence of pericardial effusion. Presence of epicardial fat layer.  Mitral Valve: The mitral valve is degenerative in appearance. Mild mitral annular calcification. Trivial mitral valve regurgitation. MV peak gradient, 4.9 mmHg. The mean mitral valve gradient is 2.0 mmHg.  Tricuspid Valve: The tricuspid valve is grossly normal. Tricuspid valve regurgitation is trivial.  Aortic Valve: The aortic valve is tricuspid. Aortic valve regurgitation is trivial. Aortic valve sclerosis/calcification is present, without any evidence of aortic stenosis. Aortic valve mean gradient measures 5.0 mmHg. Aortic valve peak gradient measures 10.4 mmHg. Aortic valve area, by VTI measures 2.17 cm.  Pulmonic Valve: The pulmonic valve was  grossly normal. Pulmonic valve regurgitation is trivial.  Aorta:  The aortic root and ascending aorta are structurally normal, with no evidence of dilitation. There is moderate (Grade III) calcified plaque involving the aortic root.  Venous: The inferior vena cava is normal in size with less than 50% respiratory variability, suggesting right atrial pressure of 8 mmHg.  IAS/Shunts: No atrial level shunt detected by color flow Doppler.   LEFT VENTRICLE PLAX 2D LVIDd:         4.40 cm      Diastology LVIDs:         3.10 cm      LV e' medial:    7.94 cm/s LV PW:         0.90 cm      LV E/e' medial:  12.8 LV IVS:        1.00 cm      LV e' lateral:   11.40 cm/s LVOT diam:     2.00 cm      LV E/e' lateral: 8.9 LV SV:         63 LV SV Index:   38 LVOT Area:     3.14 cm  LV Volumes (MOD) LV vol d, MOD A2C: 79.1 ml LV vol d, MOD A4C: 140.0 ml LV vol s, MOD A2C: 26.7 ml LV vol s, MOD A4C: 52.0 ml LV SV MOD A2C:     52.4 ml LV SV MOD A4C:     140.0 ml LV SV MOD BP:      72.0 ml  RIGHT VENTRICLE RV Basal diam:  3.60 cm RV S prime:     14.10 cm/s TAPSE (M-mode): 1.9 cm  LEFT ATRIUM             Index        RIGHT ATRIUM           Index LA diam:        3.70 cm 2.24 cm/m   RA Area:     10.90 cm LA Vol (A2C):   29.0 ml 17.57 ml/m  RA Volume:   20.10 ml  12.18 ml/m LA Vol (A4C):   34.2 ml 20.72 ml/m LA Biplane Vol: 31.6 ml 19.15 ml/m AORTIC VALVE                     PULMONIC VALVE AV Area (Vmax):    2.01 cm      PV Vmax:       0.72 m/s AV Area (Vmean):   1.90 cm      PV Peak grad:  2.1 mmHg AV Area (VTI):     2.17 cm AV Vmax:           161.00 cm/s AV Vmean:          105.000 cm/s AV VTI:            0.290 m AV Peak Grad:      10.4 mmHg AV Mean Grad:      5.0 mmHg LVOT Vmax:         103.00 cm/s LVOT Vmean:        63.400 cm/s LVOT VTI:          0.200 m LVOT/AV VTI ratio: 0.69  AORTA Ao Root diam: 3.30 cm Ao Asc diam:  3.60 cm  MITRAL VALVE MV Area (PHT): 2.95 cm      SHUNTS MV Area VTI:   1.94 cm     Systemic VTI:  0.20 m MV Peak grad:  4.9 mmHg  Systemic Diam: 2.00 cm MV Mean grad:  2.0 mmHg MV Vmax:       1.11 m/s MV Vmean:      73.4 cm/s MV Decel Time: 257 msec MV E velocity: 102.00 cm/s MV A velocity: 111.00 cm/s MV E/A ratio:  0.92  Vinie Maxcy MD Electronically signed by Vinie Maxcy MD Signature Date/Time: 02/20/2024/3:20:49 PM    Final          ______________________________________________________________________________________________        Assessment & Plan   PAF RVR - in the setting of PNA with CHADSVASC 5+ - continue heparin  - IV amodarone bolus and drip - He could be transitioned to eliquis and oral amiodarone  but would not do this today: he is sundowning and hands are in mits.  He has a neck collar and has been intermittently refusing PO.  Discussed his lethargy with nursing (intermittent).  Elevated aspiration risk. - continue IV heparin  and amiodarone  may be able to switch 02/23/24  NSTEMI - asymptomatic with trop trend flattening and no wall motion abnormalities in the setting of sepsis with PNA AGMA and DKA - this is most consistent with a Type II NSTEMI with demand ischemia  - Patient does have four-vessel coronary artery calcification on CT chest.  Continue aspirin  81 mg daily and Lipitor 40 mg daily   Chronic HFimpEF  - EF previously 25-30% in 05/2018 in the setting of sepsis.  Treated with GDMT.  Repeat echo in 12/2019 showed EF 55-60%, grade 1 DD, normal RV systolic function - Okay to hold GDMT while patient septic with low BP. He is euvolemic on exam. Dynamic function now is likely related to sepsis   Otherwise per primary  - Sepsis - Multifocal pneumonia - Recent ground-level fall with type II dens fracture - Encephalopathy, suspected be metabolic in setting of underlying infection - Severe metabolic acidosis, suspected DKA - AKI - Isolated elevation in T. bili   For questions or updates,  please contact [facility] Please consult [email or web address] for contact info under Cardiology/STEMI.      Stanly Leavens, MD FASE [facility] Cardiologist [facility]  [address] [place], [address] [place], [place] [address] [phone]  8:22 AM

## 2024-02-23 DIAGNOSIS — I5032 Chronic diastolic (congestive) heart failure: Secondary | ICD-10-CM

## 2024-02-23 DIAGNOSIS — I4891 Unspecified atrial fibrillation: Secondary | ICD-10-CM

## 2024-02-23 DIAGNOSIS — I5A Non-ischemic myocardial injury (non-traumatic): Secondary | ICD-10-CM

## 2024-02-23 DIAGNOSIS — J189 Pneumonia, unspecified organism: Secondary | ICD-10-CM

## 2024-02-23 DIAGNOSIS — I700 Atherosclerosis of aorta: Secondary | ICD-10-CM

## 2024-02-23 DIAGNOSIS — G9341 Metabolic encephalopathy: Secondary | ICD-10-CM

## 2024-02-23 DIAGNOSIS — I2510 Atherosclerotic heart disease of native coronary artery without angina pectoris: Secondary | ICD-10-CM

## 2024-02-23 DIAGNOSIS — R296 Repeated falls: Secondary | ICD-10-CM

## 2024-02-23 LAB — BASIC METABOLIC PANEL WITH GFR
Anion gap: 24 — ABNORMAL HIGH (ref 5–15)
Anion gap: 20 — ABNORMAL HIGH (ref 5–15)
Anion gap: 25 — ABNORMAL HIGH (ref 5–15)
Anion gap: 17 — ABNORMAL HIGH (ref 5–15)
BUN: 11 mg/dL (ref 8–23)
BUN: 10 mg/dL (ref 8–23)
BUN: 7 mg/dL — ABNORMAL LOW (ref 8–23)
BUN: 11 mg/dL (ref 8–23)
CO2: 8 mmol/L — ABNORMAL LOW (ref 22–32)
CO2: 11 mmol/L — ABNORMAL LOW (ref 22–32)
CO2: 8 mmol/L — ABNORMAL LOW (ref 22–32)
CO2: 10 mmol/L — ABNORMAL LOW (ref 22–32)
Calcium: 7.6 mg/dL — ABNORMAL LOW (ref 8.9–10.3)
Calcium: 7.8 mg/dL — ABNORMAL LOW (ref 8.9–10.3)
Calcium: 7.5 mg/dL — ABNORMAL LOW (ref 8.9–10.3)
Calcium: 7.4 mg/dL — ABNORMAL LOW (ref 8.9–10.3)
Chloride: 111 mmol/L (ref 98–111)
Chloride: 109 mmol/L (ref 98–111)
Chloride: 110 mmol/L (ref 98–111)
Chloride: 110 mmol/L (ref 98–111)
Creatinine, Ser: 1.21 mg/dL (ref 0.61–1.24)
Creatinine, Ser: 1.18 mg/dL (ref 0.61–1.24)
Creatinine, Ser: 1.25 mg/dL — ABNORMAL HIGH (ref 0.61–1.24)
Creatinine, Ser: 1.14 mg/dL (ref 0.61–1.24)
GFR, Estimated: 57 mL/min — ABNORMAL LOW (ref >60)
GFR, Estimated: 59 mL/min — ABNORMAL LOW (ref >60)
GFR, Estimated: 55 mL/min — ABNORMAL LOW (ref >60)
GFR, Estimated: >60 mL/min (ref >60)
Glucose, Bld: 115 mg/dL — ABNORMAL HIGH (ref 70–99)
Glucose, Bld: 111 mg/dL — ABNORMAL HIGH (ref 70–99)
Glucose, Bld: 127 mg/dL — ABNORMAL HIGH (ref 70–99)
Glucose, Bld: 152 mg/dL — ABNORMAL HIGH (ref 70–99)
Potassium: 3.1 mmol/L — ABNORMAL LOW (ref 3.5–5.1)
Potassium: 2.9 mmol/L — ABNORMAL LOW (ref 3.5–5.1)
Potassium: 3.3 mmol/L — ABNORMAL LOW (ref 3.5–5.1)
Potassium: 3.9 mmol/L (ref 3.5–5.1)
Sodium: 143 mmol/L (ref 135–145)
Sodium: 140 mmol/L (ref 135–145)
Sodium: 143 mmol/L (ref 135–145)
Sodium: 137 mmol/L (ref 135–145)

## 2024-02-23 LAB — GLUCOSE, CAPILLARY
Glucose-Capillary: 123 mg/dL — ABNORMAL HIGH (ref 70–99)
Glucose-Capillary: 96 mg/dL (ref 70–99)
Glucose-Capillary: 119 mg/dL — ABNORMAL HIGH (ref 70–99)
Glucose-Capillary: 134 mg/dL — ABNORMAL HIGH (ref 70–99)
Glucose-Capillary: 147 mg/dL — ABNORMAL HIGH (ref 70–99)
Glucose-Capillary: 134 mg/dL — ABNORMAL HIGH (ref 70–99)
Glucose-Capillary: 125 mg/dL — ABNORMAL HIGH (ref 70–99)
Glucose-Capillary: 124 mg/dL — ABNORMAL HIGH (ref 70–99)

## 2024-02-23 LAB — BETA-HYDROXYBUTYRIC ACID
Beta-Hydroxybutyric Acid: 7.23 mmol/L — ABNORMAL HIGH (ref 0.05–0.27)
Beta-Hydroxybutyric Acid: 7.01 mmol/L — ABNORMAL HIGH (ref 0.05–0.27)
Beta-Hydroxybutyric Acid: 6.51 mmol/L — ABNORMAL HIGH (ref 0.05–0.27)

## 2024-02-23 LAB — CBC
HCT: 33.8 % — ABNORMAL LOW (ref 39.0–52.0)
Hemoglobin: 11.4 g/dL — ABNORMAL LOW (ref 13.0–17.0)
MCH: 29.6 pg (ref 26.0–34.0)
MCHC: 33.7 g/dL (ref 30.0–36.0)
MCV: 87.8 fL (ref 80.0–100.0)
Platelets: 144 K/uL — ABNORMAL LOW (ref 150–400)
RBC: 3.85 MIL/uL — ABNORMAL LOW (ref 4.22–5.81)
RDW: 13.7 % (ref 11.5–15.5)
WBC: 10 K/uL (ref 4.0–10.5)
nRBC: 0 % (ref 0.0–0.2)

## 2024-02-23 LAB — LACTIC ACID, PLASMA
Lactic Acid, Venous: 0.8 mmol/L (ref 0.5–1.9)
Lactic Acid, Venous: 0.9 mmol/L (ref 0.5–1.9)

## 2024-02-23 LAB — BLOOD GAS, ARTERIAL
Acid-base deficit: 13.5 mmol/L — ABNORMAL HIGH (ref 0.0–2.0)
Bicarbonate: 10.8 mmol/L — ABNORMAL LOW (ref 20.0–28.0)
O2 Saturation: 98 %
Patient temperature: 37.2
pCO2 arterial: 22 mmHg — ABNORMAL LOW (ref 32–48)
pH, Arterial: 7.3 — ABNORMAL LOW (ref 7.35–7.45)
pO2, Arterial: 85 mmHg (ref 83–108)

## 2024-02-23 LAB — HEPARIN LEVEL (UNFRACTIONATED): Heparin Unfractionated: 0.44 [IU]/mL (ref 0.30–0.70)

## 2024-02-23 LAB — PROCALCITONIN: Procalcitonin: 0.67 ng/mL

## 2024-02-23 MED ORDER — LACTATED RINGERS IV SOLN: INTRAVENOUS | Status: AC

## 2024-02-23 MED ORDER — INSULIN REGULAR(HUMAN) IN NACL 100-0.9 UT/100ML-% IV SOLN
INTRAVENOUS | Status: DC
  Administered 2024-02-23: 2.2 [IU]/h via INTRAVENOUS
  Administered 2024-02-24: 1.4 [IU]/h via INTRAVENOUS
  Filled 2024-02-23 (×2): qty 100

## 2024-02-23 MED ORDER — CHLORHEXIDINE GLUCONATE CLOTH 2 % EX PADS
6.0000 | MEDICATED_PAD | Freq: Every day | CUTANEOUS | Status: DC
  Administered 2024-02-23 – 2024-03-01 (×8): 6 via TOPICAL

## 2024-02-23 MED ORDER — SODIUM CHLORIDE 0.9 % IV SOLN
500.0000 mg | INTRAVENOUS | Status: AC
  Administered 2024-02-23: 500 mg via INTRAVENOUS
  Filled 2024-02-23: qty 5

## 2024-02-23 MED ORDER — CLOPIDOGREL BISULFATE 75 MG PO TABS: 75.0000 mg | ORAL_TABLET | Freq: Every day | ORAL | Status: DC

## 2024-02-23 MED ORDER — CARVEDILOL 6.25 MG PO TABS
6.2500 mg | ORAL_TABLET | Freq: Two times a day (BID) | ORAL | Status: DC
  Administered 2024-02-26 – 2024-03-01 (×6): 6.25 mg via ORAL
  Filled 2024-02-23 (×7): qty 1

## 2024-02-23 MED ORDER — POTASSIUM CHLORIDE 10 MEQ/100ML IV SOLN: 10.0000 meq | INTRAVENOUS | Status: DC

## 2024-02-23 MED ORDER — AZITHROMYCIN 250 MG PO TABS
500.0000 mg | ORAL_TABLET | Freq: Every day | ORAL | Status: DC
  Filled 2024-02-23: qty 2

## 2024-02-23 MED ORDER — POTASSIUM CHLORIDE 10 MEQ/50ML IV SOLN
10.0000 meq | INTRAVENOUS | Status: AC
  Administered 2024-02-23 (×5): 10 meq via INTRAVENOUS
  Filled 2024-02-23 (×5): qty 50

## 2024-02-23 MED ORDER — LACTATED RINGERS IV BOLUS
20.0000 mL/kg | Freq: Once | INTRAVENOUS | Status: AC
  Administered 2024-02-23: 1388 mL via INTRAVENOUS

## 2024-02-23 MED ORDER — SODIUM CHLORIDE 0.9% FLUSH: 10.0000 mL | INTRAVENOUS | Status: DC | PRN

## 2024-02-23 MED ORDER — DEXTROSE IN LACTATED RINGERS 5 % IV SOLN: INTRAVENOUS | Status: AC

## 2024-02-23 MED ORDER — SODIUM CHLORIDE 0.9% FLUSH
10.0000 mL | Freq: Two times a day (BID) | INTRAVENOUS | Status: DC
  Administered 2024-02-23 – 2024-02-28 (×9): 10 mL

## 2024-02-23 MED ORDER — POTASSIUM CHLORIDE 10 MEQ/100ML IV SOLN
10.0000 meq | INTRAVENOUS | Status: AC
  Administered 2024-02-23 (×2): 10 meq via INTRAVENOUS
  Filled 2024-02-23 (×2): qty 100

## 2024-02-23 MED ORDER — HEPARIN SODIUM (PORCINE) 5000 UNIT/ML IJ SOLN
5000.0000 [IU] | Freq: Three times a day (TID) | INTRAMUSCULAR | Status: DC
  Administered 2024-02-23 – 2024-03-01 (×21): 5000 [IU] via SUBCUTANEOUS
  Filled 2024-02-23 (×19): qty 1

## 2024-02-23 MED ORDER — DEXTROSE-SODIUM CHLORIDE 5-0.45 % IV SOLN: INTRAVENOUS | Status: DC

## 2024-02-23 NOTE — Progress Notes (Addendum)
 Verified ordered bolus and continuous fluid infusions with attending. Ordering picc access.        Ordered   Start Stop  02/23/24 1154  insulin  regular, human (MYXREDLIN ) 100 units/ 100 mL infusion  Intravenous,   Continuous        02/23/24 1245 --  02/23/24 1154  lactated ringers  infusion  Intravenous,   75 mL/hr,   Continuous        02/23/24 1245 02/24/24 1244  02/23/24 1154  dextrose  5 % in lactated ringers  infusion  Intravenous,   75 mL/hr,   Continuous        02/23/24 1245 02/24/24 1244  02/23/24 1104  dextrose  5 % and 0.45 % NaCl infusion  Intravenous,   75 mL/hr,   Continuous        02/23/24 1200 02/24/24 1159  02/20/24 0858  insulin  regular, human (MYXREDLIN ) 100 units/ 100 mL infusion  Intravenous,   Continuous        02/20/24 0945

## 2024-02-23 NOTE — Progress Notes (Signed)
 Peripherally Inserted Central Catheter Placement  The IV Nurse has discussed with the patient and/or persons authorized to consent for the patient, the purpose of this procedure and the potential benefits and risks involved with this procedure.  The benefits include less needle sticks, lab draws from the catheter, and the patient may be discharged home with the catheter. Risks include, but not limited to, infection, bleeding, blood clot (thrombus formation), and puncture of an artery; nerve damage and irregular heartbeat and possibility to perform a PICC exchange if needed/ordered by physician.  Alternatives to this procedure were also discussed.  Bard Power PICC patient education guide, fact sheet on infection prevention and patient information card has been provided to patient /or left at bedside.  PICC inserted by Remonia Brought, RN   PICC Placement Documentation  PICC Triple Lumen 02/23/24 Right Cephalic 35 cm 0 cm (Active)  Indication for Insertion or Continuance of Line Administration of hyperosmolar/irritating solutions (i.e. TPN, Vancomycin , etc.);Limited venous access - need for IV therapy >5 days (PICC only) 02/23/24 1609  Exposed Catheter (cm) 0 cm 02/23/24 1609  Site Assessment Clean, Dry, Intact 02/23/24 1609  Lumen #1 Status Flushed;Saline locked;Blood return noted 02/23/24 1609  Lumen #2 Status Flushed;Saline locked;Blood return noted 02/23/24 1609  Lumen #3 Status Flushed;Saline locked;Blood return noted 02/23/24 1609  Dressing Type Transparent;Securing device 02/23/24 1609  Dressing Status Antimicrobial disc/dressing in place;Clean, Dry, Intact 02/23/24 1609  Line Care Connections checked and tightened 02/23/24 1609  Line Adjustment (NICU/IV Team Only) No 02/23/24 1609  Dressing Intervention New dressing;Adhesive placed at insertion site (IV team only) 02/23/24 1609  Dressing Change Due 03/01/24 02/23/24 1609       Matthew Calderon 02/23/2024, 4:10 PM

## 2024-02-23 NOTE — Progress Notes (Signed)
 Report from RN overnight that the patient had an aspirational episode with water when administering medications.

## 2024-02-23 NOTE — Progress Notes (Signed)
 PHARMACY - ANTICOAGULATION CONSULT NOTE  Pharmacy Consult for IV heparin  Indication: chest pain/ACS  Allergies  Allergen Reactions   Alendronate Sodium     Other reaction(s): diarrhea   Penicillin G     Other reaction(s): Unknown   Penicillins Hives    DID THE REACTION INVOLVE: Swelling of the face/tongue/throat, SOB, or low BP? No Sudden or severe rash/hives, skin peeling, or the inside of the mouth or nose? No Did it require medical treatment? No When did it last happen?childhood allergy       If all above answers are "NO", may proceed with cephalosporin use.    Sulfa Antibiotics Hives   Sulfacetamide Sodium     Other reaction(s): Unknown    Patient Measurements: Height: 4' 11 (149.9 cm) Weight: 69.4 kg (153 lb) IBW/kg (Calculated) : 47.7 HEPARIN  DW (KG): 62.7  Vital Signs: Temp: 98.1 F (36.7 C) (09/01 0500) Temp Source: Oral (09/01 0500) BP: 155/82 (09/01 0500) Pulse Rate: 91 (09/01 0500)  Labs: Recent Labs    02/20/24 1341 02/20/24 1620 02/20/24 2051 02/20/24 2051 02/21/24 0313 02/21/24 0845 02/21/24 1219 02/21/24 1931 02/22/24 0943 02/23/24 0256  HGB  --   --   --    < > 11.2*  --   --   --  12.2* 11.4*  HCT  --   --   --   --  33.4*  --   --   --  37.0* 33.8*  PLT  --   --   --   --  141*  --   --   --  143* 144*  HEPARINUNFRC  --   --  <0.10*   < >  --  0.32  --  0.43 0.38 0.44  CREATININE 1.29* 1.18 1.07  --  0.92  --   --   --   --   --   TROPONINIHS 1,119*  --   --   --   --  512* 363*  --   --   --    < > = values in this interval not displayed.    Estimated Creatinine Clearance: 43.4 mL/min (by C-G formula based on SCr of 0.92 mg/dL).   Medical History: Past Medical History:  Diagnosis Date   Anemia    takes iron pill   Anxiety    Arthritis    Depression    Diabetes mellitus without complication (HCC)    GERD (gastroesophageal reflux disease)    pepto   H/O hiatal hernia    Pneumonia    4 years ago   PONV (postoperative nausea  and vomiting) 05/14/2019   Post-nasal drip    Prostate hyperplasia, benign localized, without urinary obstruction    Seasonal allergies    Sleep apnea    no CPAP    Medications:  Infusions:   amiodarone  30 mg/hr (02/22/24 2159)   azithromycin  500 mg (02/22/24 1004)   cefTRIAXone  (ROCEPHIN )  IV 1 g (02/22/24 1023)   heparin  1,200 Units/hr (02/22/24 1017)   insulin  Stopped (02/21/24 9072)    Assessment: 89 yo male admitted with falls/AMS, found with elevated troponins and pharmacy asked to dose IV heparin .  No known anticoagulants PTA.  Heparin  level is therapeutic at 0.44 on 1200 units/hr. Hgb and platelets are stable with no signs of bleeding or issues with the infusion noted. Of note, the patient is currently in atrial fibrillation.  Goal of Therapy:  Heparin  level 0.3-0.7 units/ml Monitor platelets by anticoagulation protocol: Yes   Plan:  -  Continue heparin  1200 units/hr -Monitor heparin  level, CBC, and signs of bleeding daily  B. Amon Rocher, PharmD PGY-1 Pharmacy Resident [facility] 02/23/2024 7:15 AM   **Pharmacist phone directory can now be found on [email or web address] ([email or web address]).  Listed under [facility].

## 2024-02-23 NOTE — Progress Notes (Signed)
 Progress Note  Patient Name: Matthew Calderon Date of Encounter: 02/23/2024  Primary Cardiologist: Lynwood Schilling, MD  Subjective   Patient continues to be delirious.  Inpatient Medications    Scheduled Meds:  aspirin   81 mg Oral Daily   atorvastatin   40 mg Oral QPM   brimonidine   1 drop Both Eyes BID   carvedilol   6.25 mg Oral BID WC   cholecalciferol   1,000 Units Oral Daily   finasteride   5 mg Oral QPM   insulin  aspart  0-5 Units Subcutaneous QHS   insulin  aspart  0-9 Units Subcutaneous TID WC   insulin  glargine  15 Units Subcutaneous Daily   sertraline   50 mg Oral QPM   sodium chloride  flush  3 mL Intravenous Q12H   tamsulosin   0.4 mg Oral QHS   timolol   1 drop Both Eyes BID   Continuous Infusions:  amiodarone  30 mg/hr (02/23/24 0948)   azithromycin      cefTRIAXone  (ROCEPHIN )  IV 1 g (02/22/24 1023)   dextrose  5 % and 0.45 % NaCl     heparin  1,200 Units/hr (02/23/24 0946)   insulin  Stopped (02/21/24 0927)   potassium chloride      PRN Meds: albuterol , dextrose , haloperidol  lactate, metoprolol  tartrate   Vital Signs    Vitals:   02/22/24 2340 02/23/24 0500 02/23/24 0727 02/23/24 1027  BP: (!) 140/75 (!) 155/82 (!) 132/50 (!) 145/76  Pulse: 85 91 88 87  Resp: (!) 21 (!) 25 (!) 24 20  Temp: 98.1 F (36.7 C) 98.1 F (36.7 C) 97.6 F (36.4 C) 97.8 F (36.6 C)  TempSrc: Oral Oral Oral Oral  SpO2: 95%  96% 96%  Weight:  69.4 kg    Height:        Intake/Output Summary (Last 24 hours) at 02/23/2024 1114 Last data filed at 02/22/2024 2053 Gross per 24 hour  Intake 30 ml  Output 400 ml  Net -370 ml   Filed Weights   02/21/24 0334 02/22/24 0407 02/23/24 0500  Weight: 72 kg 68.9 kg 69.4 kg    Telemetry    Personally reviewed.  NSR  ECG    NSR  Physical Exam   GEN: No acute distress.   Neck: No JVD. Cardiac: RRR, no murmur, rub, or gallop.  Respiratory: Nonlabored. Clear to auscultation bilaterally. GI: Soft, nontender, bowel sounds present. MS:  No edema; No deformity. Neuro:  Nonfocal. Psych: Delirious.  Labs    Chemistry Recent Labs  Lab 02/18/24 1146 02/19/24 2010 02/20/24 0600 02/20/24 1309 02/20/24 1341 02/20/24 2051 02/21/24 0313 02/23/24 0808  NA 141 141   < > 142   < > 139 139 143  K 4.4 4.9   < > 4.3   < > 3.5 3.4* 3.1*  CL 105 109   < > 112*   < > 108 111 111  CO2 19* 10*   < > 13*   < > 17* 18* 8*  GLUCOSE 176* 190*   < > 256*   < > 142* 141* 115*  BUN 28* 26*   < > 25*   < > 20 17 11   CREATININE 1.02 1.59*   < > 1.36*   < > 1.07 0.92 1.21  CALCIUM  9.1 8.9   < > 8.1*   < > 8.2* 7.7* 7.6*  PROT 6.6 6.9  --  6.1*  --   --   --   --   ALBUMIN 4.0 3.5  --  3.0*  --   --   --   --  AST 13* 17  --  19  --   --   --   --   ALT 12 15  --  10  --   --   --   --   ALKPHOS 113 90  --  78  --   --   --   --   BILITOT 0.7 1.8*  --  1.2  --   --   --   --   GFRNONAA >60 41*   < > 50*   < > >60 >60 57*  ANIONGAP 17* 22*   < > 17*   < > 14 10 24*   < > = values in this interval not displayed.     Hematology Recent Labs  Lab 02/21/24 0313 02/22/24 0943 02/23/24 0256  WBC 10.1 9.1 10.0  RBC 3.75* 4.15* 3.85*  HGB 11.2* 12.2* 11.4*  HCT 33.4* 37.0* 33.8*  MCV 89.1 89.2 87.8  MCH 29.9 29.4 29.6  MCHC 33.5 33.0 33.7  RDW 13.8 13.7 13.7  PLT 141* 143* 144*    Cardiac Enzymes Recent Labs  Lab 02/20/24 0858 02/20/24 1309 02/20/24 1341 02/21/24 0845 02/21/24 1219  TROPONINIHS 792* 1,002* 1,119* 512* 363*    BNPNo results for input(s): BNP, PROBNP in the last 168 hours.   DDimerNo results for input(s): DDIMER in the last 168 hours.   Radiology    No results found.  Assessment & Plan   Atrial fibrillation with RVR - New onset this admission in the setting of multifocal pneumonia and acute metabolic encephalopathy. - Continues to be delirious.  Rates controlled. Converted to NSR. - Discontinue amiodarone  drip.  Continue carvedilol  6.25 mg twice daily. - Due to recurrent falls (recent fall  resulting in dens fracture), will avoid systemic AC upon discharge. - Continue heparin  drip for a total duration of 24 to 48 hours after pharmacological conversion and then okay to discontinue. - Echocardiogram this admission showed normal LVEF, G1 DD, normal RV function/size, moderate plaque in the aortic root and CVP 8 mmHg.  Echocardiographic evidence of moderate plaque (grade 3) in the aortic root - Currently on aspirin  81 mg once daily, switch to Plavix  75 mg once daily. - Continue atorvastatin  40 mg nightly. - Echocardiogram did not show any evidence of aortic valve stenosis or aortic regurgitation.  Myocardial injury - Hs troponins elevated, 310>>792>>1002>>1119>>512>>363.  Asymptomatic. - Likely in the setting of multifocal pneumonia.  No further cardiac workup at this time. - He has imaging evidence of four-vessel coronary artery calcifications.  Recommendation was to start aspirin  81 mg once daily, however due to moderate plaque in the aortic root, will switch aspirin  to Plavix  75 mg once daily.  Imaging evidence of coronary calcifications - CT revealed four-vessel coronary artery calcifications.  Switch aspirin  to Plavix  75 mg once daily due to moderate plaque in the aortic root and continue atorvastatin  40 mg nightly.  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  Resume GDMT this a.m. due to improved blood pressures.  Patient continues to be delirious.  Multifocal pneumonia Acute metabolic encephalopathy - On antibiotics per primary.   [facility] will sign off.   Medication Recommendations: Start Plavix  75 mg once daily (switched from aspirin ), discontinue amiodarone  drip, discontinue heparin  drip after 24 to 48 hours.  Continue carvedilol  6.5 mg twice daily, continue remaining medications. Other recommendations (labs, testing, etc): None Follow up as an outpatient: Outpatient  cardiology follow-up in 6  weeks.   Signed, Diannah SHAUNNA Maywood, MD  02/23/2024, 11:14 AM

## 2024-02-23 NOTE — Plan of Care (Signed)
  Problem: Skin Integrity: Goal: Risk for impaired skin integrity will decrease Outcome: Progressing   Problem: Education: Goal: Knowledge of General Education information will improve Description: Including pain rating scale, medication(s)/side effects and non-pharmacologic comfort measures Outcome: Progressing

## 2024-02-23 NOTE — Progress Notes (Signed)
 PROGRESS NOTE    Matthew Calderon  FMW:[id number] DOB: [birth year] DOA: 02/19/2024 PCP: Caleen Dirks, MD    Brief Narrative:   89 year old with history of CAD, CHF, DM2, HTN, HLD, OSA, disorder, recurrent falls comes to the ED from [facility] came to the ED on 8/27 from a ground-level fall found to have type II dens fracture seen by neurosurgery and placed in hard collar with plans to follow-up outpatient in 2 weeks.  But now brought back to the hospital due to concerns of fever and change in mental status.  COVID/RSV/flu were negative.  CT chest abdomen pelvis shows concerns of bilateral patchy opacity, CT head negative for acute pathology, CT cervical spine is also negative.  Assessment & Plan:  Principal Problem:   CAP (community acquired pneumonia) Active Problems:   Sepsis (HCC)    AGMA -> severe metabolic acidosis Diabetic ketoacidosis, resolved Hx of DM2 Severe acidosis and euglycemic DKA suspected secondary to Trijardy.  Required insulin  drip which has now been transitioned to subcu sliding scale and long-acting.  But this morning again lab appears to be acidotic therefore we will check stat ABG, beta hydroxybutyrate and lactic acid.  May need to start insulin  drip again.  Dysphagia - Speech and swallow evaluation  Sepsis, multifocal pneumonia -Concerns of multifocal pneumonia seen on CT chest - Change antibiotics to Rocephin  and azithromycin . - COVID/flu/RSV negative - Bronchodilators, I-S/flutter valve.  Supportive care   Recent ground-level fall complicated by type II dens fracture Repeat CT C-spine demonstrates slightly more displaced type II dens with 2 mm distraction. Clinically neurologically intact in all extremities Seen by neurosurgery, Dr. [name].  Hard c-collar adjusted to correct fitting.  Outpatient follow-up.   Encephalopathy, metabolic suspected setting of underlying infection Dementia CT head with no acute intracranial abnormality.  Likely in the setting  of infection.  B12 normal - TSH and ammonia are normal  Atrial fibrillation with RVR - From undergoing stress.  Required amiodarone  drip and heparin .  Now appears to be normal sinus rhythm.  Discussed anticoagulation risks and benefits with the daughter, we will hold off on anticoagulation due to falls risk.     Elevated troponin Coronary artery disease - Denies any chest pain, rising troponin.  CT showing evidence of 4 vessel disease.  Currently suspect demand ischemia.  Echocardiogram shows preserved EF, grade 3 aortic root plaque.  Cardiology team following.  Defer further workup to their service.  Continue heparin  drip for now.  Acute kidney injury resolved Baseline creatinine 0.7 - 1, elevated to 1.5 on admission.  Continue IV fluids   Isolated elevation in T Bili  -Trend LFT   Incidental findings: Aortic atherosclerosis, 4 vessel CAD, aortic valve calcification Cholelithiasis, diverticulosis, small hiatal hernia, prostamegaly  CHF with EF 55% Resume Coreg .  Hold Entresto .  Hypertension Slowly resuming home medications Hyperlipidemia See CAD  OSA  Not on CPAP  Mood disorder  Continue home sertraline   Recurrent falls PT/OT-SNF  DVT prophylaxis: Lovenox .     Code Status: Full Code Family Communication: Daughter up to date during the hospitalization Status is:    Subjective: Answers basic questions but pleasantly confused.  Examination:  General exam: Appears calm and comfortable, hard c-collar in place.  Elderly frail Respiratory system: Even regularly irregular Cardiovascular system: S1 & S2 heard, RRR. No JVD, murmurs, rubs, gallops or clicks. No pedal edema. Gastrointestinal system: Abdomen is nondistended, soft and nontender. No organomegaly or masses felt. Normal bowel sounds heard. Central nervous system: Alert and oriented  to name only. No focal neurological deficits. Extremities: Symmetric 5 x 5 power. Skin: No rashes, lesions or ulcers Psychiatry:  Judgement and insight appear poor                Diet Orders (From admission, onward)     Start     Ordered   02/20/24 0857  Diet Carb Modified Fluid consistency: Thin; Room service appropriate? Yes  (Diabetes Ketoacidosis (DKA))  Diet effective now       Question Answer Comment  Diet-HS Snack? Nothing   Calorie Level Medium 1600-2000   Fluid consistency: Thin   Room service appropriate? Yes      02/20/24 0858            Objective: Vitals:   02/22/24 2045 02/22/24 2340 02/23/24 0500 02/23/24 0727  BP: (!) 143/80 (!) 140/75 (!) 155/82 (!) 132/50  Pulse: 92 85 91 88  Resp: 20 (!) 21 (!) 25 (!) 24  Temp: 98.2 F (36.8 C) 98.1 F (36.7 C) 98.1 F (36.7 C) 97.6 F (36.4 C)  TempSrc: Oral Oral Oral Oral  SpO2: 97% 95%  96%  Weight:   69.4 kg   Height:        Intake/Output Summary (Last 24 hours) at 02/23/2024 1102 Last data filed at 02/22/2024 2053 Gross per 24 hour  Intake 30 ml  Output 400 ml  Net -370 ml   Filed Weights   02/21/24 0334 02/22/24 0407 02/23/24 0500  Weight: 72 kg 68.9 kg 69.4 kg    Scheduled Meds:  aspirin   81 mg Oral Daily   atorvastatin   40 mg Oral QPM   brimonidine   1 drop Both Eyes BID   carvedilol   6.25 mg Oral BID WC   cholecalciferol   1,000 Units Oral Daily   finasteride   5 mg Oral QPM   insulin  aspart  0-5 Units Subcutaneous QHS   insulin  aspart  0-9 Units Subcutaneous TID WC   insulin  glargine  15 Units Subcutaneous Daily   sertraline   50 mg Oral QPM   sodium chloride  flush  3 mL Intravenous Q12H   tamsulosin   0.4 mg Oral QHS   timolol   1 drop Both Eyes BID   Continuous Infusions:  amiodarone  30 mg/hr (02/23/24 0948)   azithromycin      cefTRIAXone  (ROCEPHIN )  IV 1 g (02/22/24 1023)   heparin  1,200 Units/hr (02/23/24 0946)   insulin  Stopped (02/21/24 9072)   potassium chloride       Nutritional status     Body mass index is 30.9 kg/m.  Data Reviewed:   CBC: Recent Labs  Lab 02/18/24 1146 02/19/24 2010  02/20/24 0322 02/21/24 0313 02/22/24 0943 02/23/24 0256  WBC 7.9 16.6* 20.3* 10.1 9.1 10.0  NEUTROABS 5.5 14.3*  --   --   --   --   HGB 14.1 14.2 12.7* 11.2* 12.2* 11.4*  HCT 43.0 44.3 41.0 33.4* 37.0* 33.8*  MCV 88.3 93.7 95.6 89.1 89.2 87.8  PLT 163 213 192 141* 143* 144*   Basic Metabolic Panel: Recent Labs  Lab 02/20/24 0600 02/20/24 0858 02/20/24 1341 02/20/24 1620 02/20/24 2051 02/21/24 0313 02/23/24 0808  NA 140   < > 141 140 139 139 143  K 4.6   < > 3.7 3.8 3.5 3.4* 3.1*  CL 109   < > 115* 110 108 111 111  CO2 <7*   < > 15* 17* 17* 18* 8*  GLUCOSE 217*   < > 247* 191* 142* 141* 115*  BUN  26*   < > 23 22 20 17 11   CREATININE 1.60*   < > 1.29* 1.18 1.07 0.92 1.21  CALCIUM  8.4*   < > 7.9* 8.1* 8.2* 7.7* 7.6*  MG 1.9  --   --   --   --   --   --   PHOS 4.7*  --   --   --   --   --   --    < > = values in this interval not displayed.   GFR: Estimated Creatinine Clearance: 33 mL/min (by C-G formula based on SCr of 1.21 mg/dL). Liver Function Tests: Recent Labs  Lab 02/18/24 1146 02/19/24 2010 02/20/24 1309  AST 13* 17 19  ALT 12 15 10   ALKPHOS 113 90 78  BILITOT 0.7 1.8* 1.2  PROT 6.6 6.9 6.1*  ALBUMIN 4.0 3.5 3.0*   No results for input(s): LIPASE, AMYLASE in the last 168 hours. Recent Labs  Lab 02/20/24 0911  AMMONIA 17   Coagulation Profile: Recent Labs  Lab 02/19/24 2010  INR 1.4*   Cardiac Enzymes: No results for input(s): CKTOTAL, CKMB, CKMBINDEX, TROPONINI in the last 168 hours. BNP (last 3 results) No results for input(s): PROBNP in the last 8760 hours. HbA1C: No results for input(s): HGBA1C in the last 72 hours. CBG: Recent Labs  Lab 02/22/24 1159 02/22/24 1700 02/22/24 2124 02/23/24 0609 02/23/24 1051  GLUCAP 112* 107* 114* 123* 96   Lipid Profile: No results for input(s): CHOL, HDL, LDLCALC, TRIG, CHOLHDL, LDLDIRECT in the last 72 hours. Thyroid  Function Tests: No results for input(s): TSH,  T4TOTAL, FREET4, T3FREE, THYROIDAB in the last 72 hours. Anemia Panel: No results for input(s): VITAMINB12, FOLATE, FERRITIN, TIBC, IRON, RETICCTPCT in the last 72 hours. Sepsis Labs: Recent Labs  Lab 02/19/24 2057 02/20/24 0322 02/20/24 0858 02/20/24 1309 02/23/24 0256  PROCALCITON  --   --   --  3.72 0.67  LATICACIDVEN 1.8 2.1* 2.6* 2.3*  --     Recent Results (from the past 240 hours)  Blood Culture (routine x 2)     Status: None (Preliminary result)   Collection Time: 02/19/24  8:09 PM   Specimen: BLOOD RIGHT FOREARM  Result Value Ref Range Status   Specimen Description BLOOD RIGHT FOREARM  Final   Special Requests   Final    BOTTLES DRAWN AEROBIC AND ANAEROBIC Blood Culture adequate volume   Culture   Final    NO GROWTH 4 DAYS Performed at [facility], [address] [place], [place] [address]    Report Status PENDING  Incomplete  Blood Culture (routine x 2)     Status: None (Preliminary result)   Collection Time: 02/19/24  8:51 PM   Specimen: BLOOD  Result Value Ref Range Status   Specimen Description BLOOD RIGHT ANTECUBITAL  Final   Special Requests   Final    BOTTLES DRAWN AEROBIC AND ANAEROBIC Blood Culture adequate volume   Culture   Final    NO GROWTH 4 DAYS Performed at [facility], [address], [place], [place] [address]    Report Status PENDING  Incomplete  Resp panel by RT-PCR (RSV, Flu A&B, Covid) Anterior Nasal Swab     Status: None   Collection Time: 02/19/24  8:52 PM   Specimen: Anterior Nasal Swab  Result Value Ref Range Status   SARS Coronavirus 2 by RT PCR NEGATIVE NEGATIVE Final   Influenza A by PCR NEGATIVE NEGATIVE Final   Influenza B  by PCR NEGATIVE NEGATIVE Final    Comment: (NOTE) The Xpert Xpress SARS-CoV-2/FLU/RSV plus assay is intended as an aid in the diagnosis of influenza from Nasopharyngeal swab specimens and should not be used as a sole basis for treatment. Nasal washings and aspirates are  unacceptable for Xpert Xpress SARS-CoV-2/FLU/RSV testing.  Fact Sheet for Patients: BloggerCourse.com  Fact Sheet for Healthcare Providers: SeriousBroker.it  This test is not yet approved or cleared by the [place]  FDA and has been authorized for detection and/or diagnosis of SARS-CoV-2 by FDA under an Emergency Use Authorization (EUA). This EUA will remain in effect (meaning this test can be used) for the duration of the COVID-19 declaration under Section 564(b)(1) of the Act, 21 U.S.C. section 360bbb-3(b)(1), unless the authorization is terminated or revoked.     Resp Syncytial Virus by PCR NEGATIVE NEGATIVE Final    Comment: (NOTE) Fact Sheet for Patients: BloggerCourse.com  Fact Sheet for Healthcare Providers: SeriousBroker.it  This test is not yet approved or cleared by the [place]  FDA and has been authorized for detection and/or diagnosis of SARS-CoV-2 by FDA under an Emergency Use Authorization (EUA). This EUA will remain in effect (meaning this test can be used) for the duration of the COVID-19 declaration under Section 564(b)(1) of the Act, 21 U.S.C. section 360bbb-3(b)(1), unless the authorization is terminated or revoked.  Performed at [facility], [address] [place], [place] [address]   Respiratory (~20 pathogens) panel by PCR     Status: None   Collection Time: 02/20/24  3:40 AM   Specimen: Nasopharyngeal Swab; Respiratory  Result Value Ref Range Status   Adenovirus NOT DETECTED NOT DETECTED Final   Coronavirus 229E NOT DETECTED NOT DETECTED Final    Comment: (NOTE) The Coronavirus on the Respiratory Panel, DOES NOT test for the novel  Coronavirus (2019 nCoV)    Coronavirus HKU1 NOT DETECTED NOT DETECTED Final   Coronavirus NL63 NOT DETECTED NOT DETECTED Final   Coronavirus OC43 NOT DETECTED NOT DETECTED Final   Metapneumovirus NOT  DETECTED NOT DETECTED Final   Rhinovirus / Enterovirus NOT DETECTED NOT DETECTED Final   Influenza A NOT DETECTED NOT DETECTED Final   Influenza B NOT DETECTED NOT DETECTED Final   Parainfluenza Virus 1 NOT DETECTED NOT DETECTED Final   Parainfluenza Virus 2 NOT DETECTED NOT DETECTED Final   Parainfluenza Virus 3 NOT DETECTED NOT DETECTED Final   Parainfluenza Virus 4 NOT DETECTED NOT DETECTED Final   Respiratory Syncytial Virus NOT DETECTED NOT DETECTED Final   Bordetella pertussis NOT DETECTED NOT DETECTED Final   Bordetella Parapertussis NOT DETECTED NOT DETECTED Final   Chlamydophila pneumoniae NOT DETECTED NOT DETECTED Final   Mycoplasma pneumoniae NOT DETECTED NOT DETECTED Final    Comment: Performed at [facility], [address], [place], [place] [address]  MRSA Next Gen by PCR, Nasal     Status: None   Collection Time: 02/20/24  3:40 AM   Specimen: Nasal Mucosa; Nasal Swab  Result Value Ref Range Status   MRSA by PCR Next Gen NOT DETECTED NOT DETECTED Final    Comment: (NOTE) The GeneXpert MRSA Assay (FDA approved for NASAL specimens only), is one component of a comprehensive MRSA colonization surveillance program. It is not intended to diagnose MRSA infection nor to guide or monitor treatment for MRSA infections. Test performance is not FDA approved in patients less than 2 years old. Performed at [facility], [address], [place], [place] [address]  Radiology Studies: No results found.         LOS: 3 days   Time spent= 35 mins    Burgess JAYSON Dare, MD [facility]  If 7PM-7AM, please contact night-coverage  02/23/2024, 11:02 AM

## 2024-02-23 NOTE — Progress Notes (Signed)
 Telephone consent obtained for PICC from [facility], stepson.  Attempted to call daughter [name] on cell and work numbers without answer several times prior to calling Mr. Lavina.  Mr. Lavina states that he is also able to consent for patient.

## 2024-02-23 NOTE — Inpatient Diabetes Management (Addendum)
 Inpatient Diabetes Program Recommendations  AACE/ADA: New Consensus Statement on Inpatient Glycemic Control (2015)  Target Ranges:  Prepandial:   less than 140 mg/dL      Peak postprandial:   less than 180 mg/dL (1-2 hours)      Critically ill patients:  140 - 180 mg/dL   Lab Results  Component Value Date   GLUCAP 96 02/23/2024   HGBA1C 10.1 (H) 02/20/2024    Review of Glycemic Control  Diabetes history: DM2 Outpatient Diabetes medications: Trijardy XR 1000 mg TB24 Current orders for Inpatient glycemic control: Lantus  15 units daily, Novolog  0-9 TID with meals and 0-5 HS  HgbA1C - 10.1% CO2 - 11, AG - 20 Pt still acidotic, will likely restart IV insulin  drip  Inpatient Diabetes Program Recommendations:    IV insulin  drip for Euglycemic DKA, restart.  When criteria met for discontinuation, give basal insulin  1-2H prior to discontinuation of drip.  Would discontinue Jardiance portion of trijardy and continue tradjenta 5 mg and potentially metformin . See how renal function recovers after drip d/ced.   Will continue to follow.  Thank you. Shona Brandy, RD, LDN, CDCES Inpatient Diabetes Coordinator [phone]

## 2024-02-23 NOTE — Progress Notes (Signed)
 Pt's ABG was taken while heparin  running. RT informed by ICU rounder that this was ok. MD aware.

## 2024-02-23 NOTE — Plan of Care (Signed)
  Problem: Metabolic: Goal: Ability to maintain appropriate glucose levels will improve Outcome: Progressing  Potassium runs  Problem: Nutritional: Goal: Maintenance of adequate nutrition will improve Outcome: Progressing   Problem: Skin Integrity: Goal: Risk for impaired skin integrity will decrease Outcome: Progressing

## 2024-02-23 NOTE — Progress Notes (Signed)
   02/23/24 1640  Assess: MEWS Score  Temp 98.3 F (36.8 C)  BP (!) 108/50  MAP (mmHg) (!) 56  Pulse Rate 96  ECG Heart Rate 96  Resp 20  Level of Consciousness Responds to Voice  SpO2 96 %  O2 Device Room Air  Assess: if the MEWS score is Yellow or Red  Were vital signs accurate and taken at a resting state? Yes  Does the patient meet 2 or more of the SIRS criteria? Yes  Does the patient have a confirmed or suspected source of infection? Yes  MEWS guidelines implemented  Yes, yellow  Treat  MEWS Interventions Considered administering scheduled or prn medications/treatments as ordered  Take Vital Signs  Increase Vital Sign Frequency  Yellow: Q2hr x1, continue Q4hrs until patient remains green for 12hrs  Escalate  MEWS: Escalate Yellow: Discuss with charge nurse and consider notifying provider and/or RRT  Notify: Charge Nurse/RN  Name of Charge Nurse/RN Notified Software engineer  Provider Notification  Provider Name/Title Caleen  Date Provider Notified 02/23/24  Time Provider Notified 1600  Method of Notification Page  Notification Reason Change in status;Critical Result  Provider response See new orders  Date of Provider Response 02/23/24  Time of Provider Response 1645  Notify: Rapid Response  Name of Rapid Response RN Notified n/a  Assess: SIRS CRITERIA  SIRS Temperature  0  SIRS Respirations  0  SIRS Pulse 1  SIRS WBC 0  SIRS Score Sum  1   PICC Line inserted LR Bolus started and and runs of Potassium. Indo tool delayed due low potassium.

## 2024-02-23 NOTE — Progress Notes (Signed)
   02/23/24 1855  Vitals  Temp 98.3 F (36.8 C)  Temp Source Oral  BP (!) 149/99  MAP (mmHg) 112  BP Location Left Leg  BP Method Automatic  Patient Position (if appropriate) Lying  Pulse Rate (!) 118  Pulse Rate Source Monitor  ECG Heart Rate (!) 117  Resp (!) 22  Level of Consciousness  Level of Consciousness Alert (confused)  Oxygen Therapy  SpO2 99 %  O2 Device Room Air  Pain Assessment  Pain Scale 0-10  Pain Score 0   Patient is easier to arousal and knows his name, birthday and where he is. States I have not been this sick in a longtime.

## 2024-02-24 ENCOUNTER — Other Ambulatory Visit: Payer: Self-pay

## 2024-02-24 DIAGNOSIS — J189 Pneumonia, unspecified organism: Secondary | ICD-10-CM | POA: Diagnosis not present

## 2024-02-24 LAB — CULTURE, BLOOD (ROUTINE X 2)
Culture: NO GROWTH
Culture: NO GROWTH
Special Requests: ADEQUATE
Special Requests: ADEQUATE

## 2024-02-24 LAB — BASIC METABOLIC PANEL WITH GFR
Anion gap: 12 (ref 5–15)
Anion gap: 13 (ref 5–15)
Anion gap: 18 — ABNORMAL HIGH (ref 5–15)
Anion gap: 15 (ref 5–15)
BUN: <5 mg/dL — ABNORMAL LOW (ref 8–23)
BUN: 7 mg/dL — ABNORMAL LOW (ref 8–23)
BUN: 5 mg/dL — ABNORMAL LOW (ref 8–23)
BUN: 7 mg/dL — ABNORMAL LOW (ref 8–23)
CO2: 14 mmol/L — ABNORMAL LOW (ref 22–32)
CO2: 16 mmol/L — ABNORMAL LOW (ref 22–32)
CO2: 11 mmol/L — ABNORMAL LOW (ref 22–32)
CO2: 13 mmol/L — ABNORMAL LOW (ref 22–32)
Calcium: 7.5 mg/dL — ABNORMAL LOW (ref 8.9–10.3)
Calcium: 7.6 mg/dL — ABNORMAL LOW (ref 8.9–10.3)
Calcium: 7.7 mg/dL — ABNORMAL LOW (ref 8.9–10.3)
Calcium: 7.7 mg/dL — ABNORMAL LOW (ref 8.9–10.3)
Chloride: 112 mmol/L — ABNORMAL HIGH (ref 98–111)
Chloride: 110 mmol/L (ref 98–111)
Chloride: 110 mmol/L (ref 98–111)
Chloride: 110 mmol/L (ref 98–111)
Creatinine, Ser: 1.04 mg/dL (ref 0.61–1.24)
Creatinine, Ser: 0.93 mg/dL (ref 0.61–1.24)
Creatinine, Ser: 0.93 mg/dL (ref 0.61–1.24)
Creatinine, Ser: 0.95 mg/dL (ref 0.61–1.24)
GFR, Estimated: >60 mL/min (ref >60)
GFR, Estimated: >60 mL/min (ref >60)
GFR, Estimated: >60 mL/min (ref >60)
GFR, Estimated: >60 mL/min (ref >60)
Glucose, Bld: 114 mg/dL — ABNORMAL HIGH (ref 70–99)
Glucose, Bld: 116 mg/dL — ABNORMAL HIGH (ref 70–99)
Glucose, Bld: 147 mg/dL — ABNORMAL HIGH (ref 70–99)
Glucose, Bld: 113 mg/dL — ABNORMAL HIGH (ref 70–99)
Potassium: 3.6 mmol/L (ref 3.5–5.1)
Potassium: 3.2 mmol/L — ABNORMAL LOW (ref 3.5–5.1)
Potassium: 3.6 mmol/L (ref 3.5–5.1)
Potassium: 3.7 mmol/L (ref 3.5–5.1)
Sodium: 138 mmol/L (ref 135–145)
Sodium: 139 mmol/L (ref 135–145)
Sodium: 139 mmol/L (ref 135–145)
Sodium: 138 mmol/L (ref 135–145)

## 2024-02-24 LAB — GLUCOSE, CAPILLARY
Glucose-Capillary: 104 mg/dL — ABNORMAL HIGH (ref 70–99)
Glucose-Capillary: 105 mg/dL — ABNORMAL HIGH (ref 70–99)
Glucose-Capillary: 107 mg/dL — ABNORMAL HIGH (ref 70–99)
Glucose-Capillary: 108 mg/dL — ABNORMAL HIGH (ref 70–99)
Glucose-Capillary: 110 mg/dL — ABNORMAL HIGH (ref 70–99)
Glucose-Capillary: 110 mg/dL — ABNORMAL HIGH (ref 70–99)
Glucose-Capillary: 112 mg/dL — ABNORMAL HIGH (ref 70–99)
Glucose-Capillary: 112 mg/dL — ABNORMAL HIGH (ref 70–99)
Glucose-Capillary: 113 mg/dL — ABNORMAL HIGH (ref 70–99)
Glucose-Capillary: 115 mg/dL — ABNORMAL HIGH (ref 70–99)
Glucose-Capillary: 116 mg/dL — ABNORMAL HIGH (ref 70–99)
Glucose-Capillary: 117 mg/dL — ABNORMAL HIGH (ref 70–99)
Glucose-Capillary: 119 mg/dL — ABNORMAL HIGH (ref 70–99)
Glucose-Capillary: 120 mg/dL — ABNORMAL HIGH (ref 70–99)
Glucose-Capillary: 120 mg/dL — ABNORMAL HIGH (ref 70–99)
Glucose-Capillary: 121 mg/dL — ABNORMAL HIGH (ref 70–99)
Glucose-Capillary: 123 mg/dL — ABNORMAL HIGH (ref 70–99)
Glucose-Capillary: 127 mg/dL — ABNORMAL HIGH (ref 70–99)
Glucose-Capillary: 131 mg/dL — ABNORMAL HIGH (ref 70–99)
Glucose-Capillary: 131 mg/dL — ABNORMAL HIGH (ref 70–99)
Glucose-Capillary: 489 mg/dL — ABNORMAL HIGH (ref 70–99)
Glucose-Capillary: 99 mg/dL (ref 70–99)

## 2024-02-24 LAB — CBC
HCT: 30.4 % — ABNORMAL LOW (ref 39.0–52.0)
Hemoglobin: 10.1 g/dL — ABNORMAL LOW (ref 13.0–17.0)
MCH: 29.6 pg (ref 26.0–34.0)
MCHC: 33.2 g/dL (ref 30.0–36.0)
MCV: 89.1 fL (ref 80.0–100.0)
Platelets: 136 K/uL — ABNORMAL LOW (ref 150–400)
RBC: 3.41 MIL/uL — ABNORMAL LOW (ref 4.22–5.81)
RDW: 14 % (ref 11.5–15.5)
WBC: 9.8 K/uL (ref 4.0–10.5)
nRBC: 0 % (ref 0.0–0.2)

## 2024-02-24 LAB — BETA-HYDROXYBUTYRIC ACID
Beta-Hydroxybutyric Acid: 2.88 mmol/L — ABNORMAL HIGH (ref 0.05–0.27)
Beta-Hydroxybutyric Acid: 3.08 mmol/L — ABNORMAL HIGH (ref 0.05–0.27)
Beta-Hydroxybutyric Acid: 3.59 mmol/L — ABNORMAL HIGH (ref 0.05–0.27)
Beta-Hydroxybutyric Acid: 4.16 mmol/L — ABNORMAL HIGH (ref 0.05–0.27)

## 2024-02-24 LAB — HEPARIN LEVEL (UNFRACTIONATED): Heparin Unfractionated: <0.1 [IU]/mL — ABNORMAL LOW (ref 0.30–0.70)

## 2024-02-24 LAB — MAGNESIUM: Magnesium: 1.7 mg/dL (ref 1.7–2.4)

## 2024-02-24 MED ORDER — POTASSIUM CHLORIDE 10 MEQ/50ML IV SOLN
10.0000 meq | INTRAVENOUS | Status: AC
  Administered 2024-02-24 (×5): 10 meq via INTRAVENOUS
  Filled 2024-02-24 (×5): qty 50

## 2024-02-24 MED ORDER — MAGNESIUM SULFATE 2 GM/50ML IV SOLN
2.0000 g | Freq: Once | INTRAVENOUS | Status: AC
  Administered 2024-02-24: 2 g via INTRAVENOUS
  Filled 2024-02-24: qty 50

## 2024-02-24 NOTE — Progress Notes (Signed)
 Speech Language Pathology Treatment: Dysphagia  Patient Details Name: Matthew Calderon MRN: [id number] DOB: 1933-01-25 Today's Date: 02/24/2024 Time: 8462-8398 SLP Time Calculation (min) (ACUTE ONLY): 24 min  Assessment / Plan / Recommendation Clinical Impression  Patient presents with clinical s/s of dysphagia as per this diet check. He was awake, alert, and participated fully in this treatment. Patient was confused about his location throughout the session, sharing that he was at [employer] and needing to go pay. When reminded his location by the SLP, patient would share details about his recent fall. After oral care, SLP assessed patient's swallow via PO's of ice chips and water sips. Patient tolerated the ice chips well and chewed them. Patient demonstrated signs of aspiration with trials of water. Patient had an immediate cough following his sip of water. Patient will need MBS prior to diet initiation. Recommending ice chips with PRN or nuses only. SLP will continue to follow.   HPI HPI: Matthew Calderon is an 89 year old male who presented to the ED from [facility]F came to the ED on 8/27 from a ground-level fall found to have type II dens fracture seen by neurosurgery and placed in hard collar with plans to follow-up outpatient in 2 weeks.  But now brought back to the hospital 8/28 due to concerns of fever and change in mental status. CT chest 8/28: Bilateral lower lobe patchy airspace opacities. Finding may represent atelectasis with superimposed infection.  COVID/RSV/influenza negative. Pt reporting nonproductive cough and dysphagia in ED.  Pt with history of CAD, CHF, DM2, HTN, HLD, OSA, disorder, recurrent falls. BSE completed on 8/29 and was placed on regular thin with a MBS. Per radiology staff, RN was not able to come with pt at the time of his first schedule MBS. On 9/1, Diet NPO due to Diabetes Ketoacidosis (DKA). SLP to assess swallow function.      SLP Plan  Continue with current plan of  care          Recommendations  Diet recommendations: Other(comment);NPO (Ice chips with PRN/Nurse only) Medication Administration: Via alternative means Supervision: Full supervision/cueing for compensatory strategies Compensations: Small sips/bites Postural Changes and/or Swallow Maneuvers: Seated upright 90 degrees                  Oral care BID     Dysphagia, unspecified (R13.10)     Continue with current plan of care     Damien Hy  Graduate SLP Clinican

## 2024-02-24 NOTE — Progress Notes (Signed)
 Physical Therapy Treatment Patient Details Name: Matthew Calderon MRN: [id number] DOB: January 30, 1933 Today's Date: 02/24/2024   History of Present Illness 89 y.o. male presents to [facility] hospital on 02/19/2024 with recent falls and type II dens fx, brought back in with fever and AMS. Admitted for sepsis due to multifocal PNA, metabolic encephalopathy. Hospital course complicated by new onset afib with RVR 8/30 that converted to SR 8/31. PMH includes CAD, CHF, DMII, HTN, HLD, OSA, mood disorder.    PT Comments  Pt agreeable to session with focus on transfer training and resuming gait training. Pt with significant decline in functional status, strength, and level of assist needed over weekend, needing modA for most activity (bed mobility, transfers, and short bout of gait). Pt heavily dependent on BUE support, but also needing modA to power up to standing, manage RW, and prevent posterior LOB. No buckling this session, but will need consistent modA for any OOB mobility. Continue to recommend post-acute therapies <3hours/day to facilitate return to baseline mobility.     If plan is discharge home, recommend the following: A lot of help with bathing/dressing/bathroom;Assistance with cooking/housework;Assist for transportation;Help with stairs or ramp for entrance;Direct supervision/assist for medications management;Direct supervision/assist for financial management;Supervision due to cognitive status;Two people to help with walking and/or transfers   Can travel by private vehicle     No  Equipment Recommendations  BSC/3in1;Wheelchair (measurements PT);Wheelchair cushion (measurements PT)    Recommendations for Other Services       Precautions / Restrictions Precautions Precautions: Fall;Cervical Precaution Booklet Issued: No (verbally reviewed neck precautions) Recall of Precautions/Restrictions: Impaired Required Braces or Orthoses: Cervical Brace Cervical Brace: Hard collar;At all  times Restrictions Weight Bearing Restrictions Per Provider Order: No     Mobility  Bed Mobility Overal bed mobility: Needs Assistance Bed Mobility: Rolling, Sidelying to Sit Rolling: Mod assist, Used rails Sidelying to sit: HOB elevated, Used rails, Mod assist       General bed mobility comments: maxA to initiate roll, modA to complete, modA to elevate trunk and maintain static sitting balance    Transfers Overall transfer level: Needs assistance Equipment used: Rolling walker (2 wheels) Transfers: Sit to/from Stand, Bed to chair/wheelchair/BSC Sit to Stand: Mod assist   Step pivot transfers: Mod assist       General transfer comment: modA to rise from EOB, BSC, and recliner. poor power in LE to rise and heavy dependence on PT to initiate hip lift and to steady while pt moves hands to RW. modA to maintain balance and manage RW while pt pivoting    Ambulation/Gait Ambulation/Gait assistance: Mod assist Gait Distance (Feet): 5 Feet Assistive device: Rolling walker (2 wheels) Gait Pattern/deviations: Step-to pattern Gait velocity: reduced Gait velocity interpretation: <1.31 ft/sec, indicative of household ambulator   General Gait Details: small steps with minimal clearance, trunk flexed, and heavy dependence on UE support. modA to manage RW as well. limited by fatigue and pt reports dizziness (only BP access in LE, elevated once pt seated to take reading)   Stairs             Wheelchair Mobility     Tilt Bed    Modified Rankin (Stroke Patients Only)       Balance Overall balance assessment: Needs assistance Sitting-balance support: No upper extremity supported, Feet supported Sitting balance-Leahy Scale: Poor Sitting balance - Comments: up to modA to maintain static sitting Postural control: Posterior lean Standing balance support: Bilateral upper extremity supported, Reliant on assistive device for balance Standing  balance-Leahy Scale: Poor Standing  balance comment: dependent on BUE support and modA to maintain balance as well as assist with RW management                            Communication Communication Communication: Impaired Factors Affecting Communication: Hearing impaired  Cognition Arousal: Alert Behavior During Therapy: WFL for tasks assessed/performed   PT - Cognitive impairments: No family/caregiver present to determine baseline, Orientation, Memory, Attention, Initiation, Sequencing, Problem solving, Safety/Judgement   Orientation impairments: Time, Situation                   PT - Cognition Comments: pt unable to name month or day, knew he was at hospital but unsure why. Needed frequent and repeated cues to initiate and complete all movement. no initiation to correct LOB and asking for water despite being NPO Following commands: Intact      Cueing Cueing Techniques: Verbal cues  Exercises      General Comments        Pertinent Vitals/Pain Pain Assessment Faces Pain Scale: Hurts little more Pain Location: neck Pain Descriptors / Indicators: Grimacing    Home Living                          Prior Function            PT Goals (current goals can now be found in the care plan section) Acute Rehab PT Goals Patient Stated Goal: to reduce risk for falls PT Goal Formulation: With patient Time For Goal Achievement: 03/05/24 Potential to Achieve Goals: Fair Progress towards PT goals: Progressing toward goals    Frequency    Min 2X/week      PT Plan      Co-evaluation              AM-PAC PT 6 Clicks Mobility   Outcome Measure  Help needed turning from your back to your side while in a flat bed without using bedrails?: A Lot Help needed moving from lying on your back to sitting on the side of a flat bed without using bedrails?: A Lot Help needed moving to and from a bed to a chair (including a wheelchair)?: A Lot Help needed standing up from a chair using  your arms (e.g., wheelchair or bedside chair)?: A Lot Help needed to walk in hospital room?: Total Help needed climbing 3-5 steps with a railing? : Total 6 Click Score: 10    End of Session Equipment Utilized During Treatment: Gait belt;Cervical collar Activity Tolerance: Patient tolerated treatment well Patient left: in chair;with call bell/phone within reach;with chair alarm set Nurse Communication: Mobility status (bowel movement, cervical precautions) PT Visit Diagnosis: Other abnormalities of gait and mobility (R26.89);Muscle weakness (generalized) (M62.81);History of falling (Z91.81)     Time: 8881-8853 PT Time Calculation (min) (ACUTE ONLY): 28 min  Charges:    $Therapeutic Exercise: 8-22 mins $Therapeutic Activity: 8-22 mins PT General Charges $$ ACUTE PT VISIT: 1 Visit                     Izetta Call, PT, DPT   Acute Rehabilitation Department Office [phone] Secure Chat Communication Preferred   Izetta JULIANNA Call 02/24/2024, 12:10 PM

## 2024-02-24 NOTE — TOC Progression Note (Addendum)
 Transition of Care [facility]) - Progression Note    Patient Details  Name: Matthew Calderon MRN: [id number] Date of Birth: 03-02-1933  [facility]) CM/SW Contact  Luise JAYSON Pan, [place] Phone Number: 02/24/2024, 11:35 AM  Clinical Narrative:   Awaiting updated PT note to submit for prior authorization.   4:10 PM Per MD, patient will potentially be medically stable in 48-72 hrs. CSW to submit for auth when patient is closer to being medically stable.   CSW will continue to follow.    Expected Discharge Plan: Skilled Nursing Facility Barriers to Discharge: Continued Medical Work up, English as a second language teacher               Expected Discharge Plan and Services In-house Referral: Clinical Social Work     Living arrangements for the past 2 months: Assisted Living Facility                                       Social Drivers of Health (SDOH) Interventions SDOH Screenings   Food Insecurity: No Food Insecurity (08/12/2023)  Housing: Low Risk  (08/12/2023)  Transportation Needs: No Transportation Needs (08/12/2023)  Utilities: Not At Risk (08/12/2023)  Social Connections: Unknown (08/12/2023)  Tobacco Use: Medium Risk (02/19/2024)    Readmission Risk Interventions     No data to display

## 2024-02-24 NOTE — Progress Notes (Signed)
 PROGRESS NOTE    Matthew Calderon  FMW:[id number] DOB: 12-10-1932 DOA: 02/19/2024 PCP: Caleen Dirks, MD    Brief Narrative:   89 year old with history of CAD, CHF, DM2, HTN, HLD, OSA, disorder, recurrent falls comes to the ED from [facility]F came to the ED on 8/27 from a ground-level fall found to have type II dens fracture seen by neurosurgery and placed in hard collar with plans to follow-up outpatient in 2 weeks.  But now brought back to the hospital due to concerns of fever and change in mental status.  COVID/RSV/flu were negative.  CT chest abdomen pelvis shows concerns of bilateral patchy opacity, CT head negative for acute pathology, CT cervical spine is also negative.  Patient initially was in euglycemic DKA secondary Trijardy requiring insulin  drip and was transitioned to subcu but again on 9/1 went back into euglycemic DKA requiring Endo tool.  Assessment & Plan:  Principal Problem:   CAP (community acquired pneumonia) Active Problems:   Sepsis (HCC)    Euglycemic diabetic ketoacidosis Uncontrolled DM 2 secondary to hyperglycemia Severe acidosis and euglycemic DKA suspected secondary to Trijardy.  Required insulin  drip which has now been transitioned to subcu sliding scale and long-acting.  Patient went back into euglycemic DKA on 9/1 requiring DKA protocol/Endo tool.  Although anion gap is closed, beta hydroxy remains elevated.  Will allow extra time this time for DKA protocol to run as there may be some lingering metabolites of Trijardy.  -A1c 10.1.  Discussed with daughter that patient will require insulin  upon discharge.  Diabetic coordinator has been consulted  Dysphagia - Speech and swallow evaluation once his mentation has better.  Currently he is n.p.o.  Sepsis, multifocal pneumonia -Concerns of multifocal pneumonia seen on CT chest - Completed 3 days of azithromycin , completed 7 days of Rocephin  - COVID/flu/RSV negative - Bronchodilators, I-S/flutter valve.   Supportive care   Recent ground-level fall complicated by type II dens fracture Repeat CT C-spine demonstrates slightly more displaced type II dens with 2 mm distraction. Clinically neurologically intact in all extremities Seen by neurosurgery, Dr. [name].  Hard c-collar adjusted to correct fitting.  Outpatient follow-up.   Encephalopathy, metabolic suspected setting of underlying infection Dementia CT head with no acute intracranial abnormality.  Likely in the setting of infection and acidosis.  B12 normal - TSH and ammonia are normal  Atrial fibrillation with RVR - Likely from undergoing stress briefly requiring amiodarone  drip.  Now transition to Coreg .  Discussed with daughter that we will not use long-term anticoagulation due to risk of fall  Elevated troponin Coronary artery disease -Initially significantly elevated troponins CT showing evidence of 4 vessel disease.  Currently suspect demand ischemia.  Echocardiogram shows preserved EF, grade 3 aortic root plaque.  Completed 48 hours of heparin  drip.  Aspirin  has been changed to Plavix .  Acute kidney injury resolved Baseline creatinine 0.7 - 1, elevated to 1.5 on admission.  Continue IV fluids per DKA protocol   Isolated elevation in T Bili  -Trend LFT   Incidental findings: Aortic atherosclerosis, 4 vessel CAD, aortic valve calcification Cholelithiasis, diverticulosis, small hiatal hernia, prostamegaly  CHF with EF 55% Resume Coreg .  Hold Entresto .  Hypertension Slowly resuming home medications Hyperlipidemia See CAD  OSA  Not on CPAP  Mood disorder  Continue home sertraline   Recurrent falls PT/OT-SNF  DVT prophylaxis: Lovenox .     Code Status: Full Code Family Communication: Daughter up to date during the hospitalization Status is:    Subjective: No complaints.  Pleasantly confused.  Examination:  General exam: Appears calm and comfortable, hard c-collar in place.  Elderly frail Respiratory system: Even  regularly irregular Cardiovascular system: S1 & S2 heard, RRR. No JVD, murmurs, rubs, gallops or clicks. No pedal edema. Gastrointestinal system: Abdomen is nondistended, soft and nontender. No organomegaly or masses felt. Normal bowel sounds heard. Central nervous system: Alert and oriented to name only. No focal neurological deficits. Extremities: Symmetric 5 x 5 power. Skin: No rashes, lesions or ulcers Psychiatry: Judgement and insight appear poor                Diet Orders (From admission, onward)     Start     Ordered   02/23/24 1152  Diet NPO time specified  (Diabetes Ketoacidosis (DKA))  Diet effective now        02/23/24 1154            Objective: Vitals:   02/24/24 0500 02/24/24 0600 02/24/24 0700 02/24/24 0755  BP:      Pulse:    98  Resp: (!) 23 (!) 24 (!) 30 20  Temp:    99.6 F (37.6 C)  TempSrc:    Axillary  SpO2:    95%  Weight:      Height:        Intake/Output Summary (Last 24 hours) at 02/24/2024 1053 Last data filed at 02/24/2024 0700 Gross per 24 hour  Intake 3947.33 ml  Output 1550 ml  Net 2397.33 ml   Filed Weights   02/22/24 0407 02/23/24 0500 02/24/24 0457  Weight: 68.9 kg 69.4 kg 72.2 kg    Scheduled Meds:  atorvastatin   40 mg Oral QPM   brimonidine   1 drop Both Eyes BID   carvedilol   6.25 mg Oral BID WC   Chlorhexidine  Gluconate Cloth  6 each Topical Daily   cholecalciferol   1,000 Units Oral Daily   finasteride   5 mg Oral QPM   heparin  injection (subcutaneous)  5,000 Units Subcutaneous Q8H   sertraline   50 mg Oral QPM   sodium chloride  flush  10-40 mL Intracatheter Q12H   sodium chloride  flush  3 mL Intravenous Q12H   tamsulosin   0.4 mg Oral QHS   timolol   1 drop Both Eyes BID   Continuous Infusions:  cefTRIAXone  (ROCEPHIN )  IV 1 g (02/23/24 1154)   dextrose  5% lactated ringers  75 mL/hr at 02/24/24 0700   insulin  0.4 Units/hr (02/24/24 0700)   lactated ringers      magnesium  sulfate bolus IVPB 2 g (02/24/24 1012)    potassium chloride       Nutritional status     Body mass index is 32.15 kg/m.  Data Reviewed:   CBC: Recent Labs  Lab 02/18/24 1146 02/19/24 2010 02/20/24 0322 02/21/24 0313 02/22/24 0943 02/23/24 0256 02/24/24 0440  WBC 7.9 16.6* 20.3* 10.1 9.1 10.0 9.8  NEUTROABS 5.5 14.3*  --   --   --   --   --   HGB 14.1 14.2 12.7* 11.2* 12.2* 11.4* 10.1*  HCT 43.0 44.3 41.0 33.4* 37.0* 33.8* 30.4*  MCV 88.3 93.7 95.6 89.1 89.2 87.8 89.1  PLT 163 213 192 141* 143* 144* 136*   Basic Metabolic Panel: Recent Labs  Lab 02/20/24 0600 02/20/24 0858 02/23/24 1200 02/23/24 1534 02/23/24 2006 02/24/24 0031 02/24/24 0617  NA 140   < > 140 143 137 138 139  K 4.6   < > 2.9* 3.3* 3.9 3.6 3.2*  CL 109   < > 109 110 110  112* 110  CO2 <7*   < > 11* 8* 10* 14* 16*  GLUCOSE 217*   < > 111* 127* 152* 114* 116*  BUN 26*   < > 10 7* 11 <5* 7*  CREATININE 1.60*   < > 1.18 1.25* 1.14 1.04 0.93  CALCIUM  8.4*   < > 7.8* 7.5* 7.4* 7.5* 7.6*  MG 1.9  --   --   --   --   --  1.7  PHOS 4.7*  --   --   --   --   --   --    < > = values in this interval not displayed.   GFR: Estimated Creatinine Clearance: 43.8 mL/min (by C-G formula based on SCr of 0.93 mg/dL). Liver Function Tests: Recent Labs  Lab 02/18/24 1146 02/19/24 2010 02/20/24 1309  AST 13* 17 19  ALT 12 15 10   ALKPHOS 113 90 78  BILITOT 0.7 1.8* 1.2  PROT 6.6 6.9 6.1*  ALBUMIN 4.0 3.5 3.0*   No results for input(s): LIPASE, AMYLASE in the last 168 hours. Recent Labs  Lab 02/20/24 0911  AMMONIA 17   Coagulation Profile: Recent Labs  Lab 02/19/24 2010  INR 1.4*   Cardiac Enzymes: No results for input(s): CKTOTAL, CKMB, CKMBINDEX, TROPONINI in the last 168 hours. BNP (last 3 results) No results for input(s): PROBNP in the last 8760 hours. HbA1C: No results for input(s): HGBA1C in the last 72 hours. CBG: Recent Labs  Lab 02/24/24 0450 02/24/24 0603 02/24/24 0711 02/24/24 0826 02/24/24 0943   GLUCAP 117* 116* 107* 110* 112*   Lipid Profile: No results for input(s): CHOL, HDL, LDLCALC, TRIG, CHOLHDL, LDLDIRECT in the last 72 hours. Thyroid  Function Tests: No results for input(s): TSH, T4TOTAL, FREET4, T3FREE, THYROIDAB in the last 72 hours. Anemia Panel: No results for input(s): VITAMINB12, FOLATE, FERRITIN, TIBC, IRON, RETICCTPCT in the last 72 hours. Sepsis Labs: Recent Labs  Lab 02/20/24 0858 02/20/24 1309 02/23/24 0256 02/23/24 1159 02/23/24 1534  PROCALCITON  --  3.72 0.67  --   --   LATICACIDVEN 2.6* 2.3*  --  0.8 0.9    Recent Results (from the past 240 hours)  Blood Culture (routine x 2)     Status: None   Collection Time: 02/19/24  8:09 PM   Specimen: BLOOD RIGHT FOREARM  Result Value Ref Range Status   Specimen Description BLOOD RIGHT FOREARM  Final   Special Requests   Final    BOTTLES DRAWN AEROBIC AND ANAEROBIC Blood Culture adequate volume   Culture   Final    NO GROWTH 5 DAYS Performed at [facility], [address], [place], [place] [address]    Report Status 02/24/2024 FINAL  Final  Blood Culture (routine x 2)     Status: None   Collection Time: 02/19/24  8:51 PM   Specimen: BLOOD  Result Value Ref Range Status   Specimen Description BLOOD RIGHT ANTECUBITAL  Final   Special Requests   Final    BOTTLES DRAWN AEROBIC AND ANAEROBIC Blood Culture adequate volume   Culture   Final    NO GROWTH 5 DAYS Performed at [facility], [address] [place], [place] [address]    Report Status 02/24/2024 FINAL  Final  Resp panel by RT-PCR (RSV, Flu A&B, Covid) Anterior Nasal Swab     Status: None   Collection Time: 02/19/24  8:52 PM   Specimen: Anterior Nasal Swab  Result Value Ref Range Status  SARS Coronavirus 2 by RT PCR NEGATIVE NEGATIVE Final   Influenza A by PCR NEGATIVE NEGATIVE Final   Influenza B by PCR NEGATIVE NEGATIVE Final    Comment: (NOTE) The Xpert Xpress SARS-CoV-2/FLU/RSV plus assay  is intended as an aid in the diagnosis of influenza from Nasopharyngeal swab specimens and should not be used as a sole basis for treatment. Nasal washings and aspirates are unacceptable for Xpert Xpress SARS-CoV-2/FLU/RSV testing.  Fact Sheet for Patients: BloggerCourse.com  Fact Sheet for Healthcare Providers: SeriousBroker.it  This test is not yet approved or cleared by the [place]  FDA and has been authorized for detection and/or diagnosis of SARS-CoV-2 by FDA under an Emergency Use Authorization (EUA). This EUA will remain in effect (meaning this test can be used) for the duration of the COVID-19 declaration under Section 564(b)(1) of the Act, 21 U.S.C. section 360bbb-3(b)(1), unless the authorization is terminated or revoked.     Resp Syncytial Virus by PCR NEGATIVE NEGATIVE Final    Comment: (NOTE) Fact Sheet for Patients: BloggerCourse.com  Fact Sheet for Healthcare Providers: SeriousBroker.it  This test is not yet approved or cleared by the [place]  FDA and has been authorized for detection and/or diagnosis of SARS-CoV-2 by FDA under an Emergency Use Authorization (EUA). This EUA will remain in effect (meaning this test can be used) for the duration of the COVID-19 declaration under Section 564(b)(1) of the Act, 21 U.S.C. section 360bbb-3(b)(1), unless the authorization is terminated or revoked.  Performed at [facility], [address], [place], [place] [address]   Respiratory (~20 pathogens) panel by PCR     Status: None   Collection Time: 02/20/24  3:40 AM   Specimen: Nasopharyngeal Swab; Respiratory  Result Value Ref Range Status   Adenovirus NOT DETECTED NOT DETECTED Final   Coronavirus 229E NOT DETECTED NOT DETECTED Final    Comment: (NOTE) The Coronavirus on the Respiratory Panel, DOES NOT test for the novel  Coronavirus (2019 nCoV)     Coronavirus HKU1 NOT DETECTED NOT DETECTED Final   Coronavirus NL63 NOT DETECTED NOT DETECTED Final   Coronavirus OC43 NOT DETECTED NOT DETECTED Final   Metapneumovirus NOT DETECTED NOT DETECTED Final   Rhinovirus / Enterovirus NOT DETECTED NOT DETECTED Final   Influenza A NOT DETECTED NOT DETECTED Final   Influenza B NOT DETECTED NOT DETECTED Final   Parainfluenza Virus 1 NOT DETECTED NOT DETECTED Final   Parainfluenza Virus 2 NOT DETECTED NOT DETECTED Final   Parainfluenza Virus 3 NOT DETECTED NOT DETECTED Final   Parainfluenza Virus 4 NOT DETECTED NOT DETECTED Final   Respiratory Syncytial Virus NOT DETECTED NOT DETECTED Final   Bordetella pertussis NOT DETECTED NOT DETECTED Final   Bordetella Parapertussis NOT DETECTED NOT DETECTED Final   Chlamydophila pneumoniae NOT DETECTED NOT DETECTED Final   Mycoplasma pneumoniae NOT DETECTED NOT DETECTED Final    Comment: Performed at [facility], [address], [place], [place] [address]  MRSA Next Gen by PCR, Nasal     Status: None   Collection Time: 02/20/24  3:40 AM   Specimen: Nasal Mucosa; Nasal Swab  Result Value Ref Range Status   MRSA by PCR Next Gen NOT DETECTED NOT DETECTED Final    Comment: (NOTE) The GeneXpert MRSA Assay (FDA approved for NASAL specimens only), is one component of a comprehensive MRSA colonization surveillance program. It is not intended to diagnose MRSA infection nor to guide or monitor treatment for MRSA infections. Test performance is not FDA  approved in patients less than 63 years old. Performed at [facility], [address] [place], [place] [address]          Radiology Studies: US  EKG SITE RITE Result Date: 02/23/2024 If Site Rite image not attached, placement could not be confirmed due to current cardiac rhythm.          LOS: 4 days   Time spent= 35 mins    Burgess JAYSON Dare, MD [facility]  If 7PM-7AM, please contact night-coverage  02/24/2024, 10:53 AM

## 2024-02-25 ENCOUNTER — Inpatient Hospital Stay (HOSPITAL_COMMUNITY)

## 2024-02-25 DIAGNOSIS — E1110 Type 2 diabetes mellitus with ketoacidosis without coma: Secondary | ICD-10-CM

## 2024-02-25 LAB — BASIC METABOLIC PANEL WITH GFR
Anion gap: 23 — ABNORMAL HIGH (ref 5–15)
Anion gap: 16 — ABNORMAL HIGH (ref 5–15)
Anion gap: 15 (ref 5–15)
BUN: 9 mg/dL (ref 8–23)
BUN: 11 mg/dL (ref 8–23)
BUN: 11 mg/dL (ref 8–23)
CO2: 8 mmol/L — ABNORMAL LOW (ref 22–32)
CO2: 10 mmol/L — ABNORMAL LOW (ref 22–32)
CO2: 12 mmol/L — ABNORMAL LOW (ref 22–32)
Calcium: 7.6 mg/dL — ABNORMAL LOW (ref 8.9–10.3)
Calcium: 7.8 mg/dL — ABNORMAL LOW (ref 8.9–10.3)
Calcium: 7.8 mg/dL — ABNORMAL LOW (ref 8.9–10.3)
Chloride: 110 mmol/L (ref 98–111)
Chloride: 113 mmol/L — ABNORMAL HIGH (ref 98–111)
Chloride: 114 mmol/L — ABNORMAL HIGH (ref 98–111)
Creatinine, Ser: 1.13 mg/dL (ref 0.61–1.24)
Creatinine, Ser: 1.23 mg/dL (ref 0.61–1.24)
Creatinine, Ser: 1.17 mg/dL (ref 0.61–1.24)
GFR, Estimated: >60 mL/min (ref >60)
GFR, Estimated: 56 mL/min — ABNORMAL LOW (ref >60)
GFR, Estimated: 60 mL/min — ABNORMAL LOW (ref >60)
Glucose, Bld: 110 mg/dL — ABNORMAL HIGH (ref 70–99)
Glucose, Bld: 200 mg/dL — ABNORMAL HIGH (ref 70–99)
Glucose, Bld: 200 mg/dL — ABNORMAL HIGH (ref 70–99)
Potassium: 3.5 mmol/L (ref 3.5–5.1)
Potassium: 4.1 mmol/L (ref 3.5–5.1)
Potassium: 3.5 mmol/L (ref 3.5–5.1)
Sodium: 141 mmol/L (ref 135–145)
Sodium: 139 mmol/L (ref 135–145)
Sodium: 141 mmol/L (ref 135–145)

## 2024-02-25 LAB — CBC
HCT: 34.3 % — ABNORMAL LOW (ref 39.0–52.0)
Hemoglobin: 11.2 g/dL — ABNORMAL LOW (ref 13.0–17.0)
MCH: 29.2 pg (ref 26.0–34.0)
MCHC: 32.7 g/dL (ref 30.0–36.0)
MCV: 89.6 fL (ref 80.0–100.0)
Platelets: 166 K/uL (ref 150–400)
RBC: 3.83 MIL/uL — ABNORMAL LOW (ref 4.22–5.81)
RDW: 14.2 % (ref 11.5–15.5)
WBC: 11.7 K/uL — ABNORMAL HIGH (ref 4.0–10.5)
nRBC: 0 % (ref 0.0–0.2)

## 2024-02-25 LAB — BETA-HYDROXYBUTYRIC ACID
Beta-Hydroxybutyric Acid: >8 mmol/L — ABNORMAL HIGH (ref 0.05–0.27)
Beta-Hydroxybutyric Acid: 6.06 mmol/L — ABNORMAL HIGH (ref 0.05–0.27)
Beta-Hydroxybutyric Acid: 3.08 mmol/L — ABNORMAL HIGH (ref 0.05–0.27)

## 2024-02-25 LAB — GLUCOSE, CAPILLARY
Glucose-Capillary: 100 mg/dL — ABNORMAL HIGH (ref 70–99)
Glucose-Capillary: 103 mg/dL — ABNORMAL HIGH (ref 70–99)
Glucose-Capillary: 104 mg/dL — ABNORMAL HIGH (ref 70–99)
Glucose-Capillary: 109 mg/dL — ABNORMAL HIGH (ref 70–99)
Glucose-Capillary: 111 mg/dL — ABNORMAL HIGH (ref 70–99)
Glucose-Capillary: 116 mg/dL — ABNORMAL HIGH (ref 70–99)
Glucose-Capillary: 131 mg/dL — ABNORMAL HIGH (ref 70–99)
Glucose-Capillary: 133 mg/dL — ABNORMAL HIGH (ref 70–99)
Glucose-Capillary: 155 mg/dL — ABNORMAL HIGH (ref 70–99)
Glucose-Capillary: 166 mg/dL — ABNORMAL HIGH (ref 70–99)
Glucose-Capillary: 174 mg/dL — ABNORMAL HIGH (ref 70–99)
Glucose-Capillary: 175 mg/dL — ABNORMAL HIGH (ref 70–99)
Glucose-Capillary: 180 mg/dL — ABNORMAL HIGH (ref 70–99)
Glucose-Capillary: 194 mg/dL — ABNORMAL HIGH (ref 70–99)
Glucose-Capillary: 199 mg/dL — ABNORMAL HIGH (ref 70–99)
Glucose-Capillary: 209 mg/dL — ABNORMAL HIGH (ref 70–99)
Glucose-Capillary: 211 mg/dL — ABNORMAL HIGH (ref 70–99)
Glucose-Capillary: 88 mg/dL (ref 70–99)
Glucose-Capillary: 90 mg/dL (ref 70–99)
Glucose-Capillary: 93 mg/dL (ref 70–99)
Glucose-Capillary: 95 mg/dL (ref 70–99)
Glucose-Capillary: 95 mg/dL (ref 70–99)
Glucose-Capillary: 96 mg/dL (ref 70–99)

## 2024-02-25 LAB — MAGNESIUM: Magnesium: 2 mg/dL (ref 1.7–2.4)

## 2024-02-25 MED ORDER — INSULIN REGULAR(HUMAN) IN NACL 100-0.9 UT/100ML-% IV SOLN
INTRAVENOUS | Status: DC
  Administered 2024-02-25: 1.2 [IU]/h via INTRAVENOUS

## 2024-02-25 MED ORDER — POTASSIUM CHLORIDE 10 MEQ/50ML IV SOLN
10.0000 meq | INTRAVENOUS | Status: AC
  Administered 2024-02-25 (×4): 10 meq via INTRAVENOUS
  Filled 2024-02-25 (×4): qty 50

## 2024-02-25 MED ORDER — DEXTROSE IN LACTATED RINGERS 5 % IV SOLN: INTRAVENOUS | Status: AC

## 2024-02-25 MED ORDER — POTASSIUM CHLORIDE 10 MEQ/100ML IV SOLN
10.0000 meq | INTRAVENOUS | Status: DC
  Filled 2024-02-25: qty 100

## 2024-02-25 NOTE — Progress Notes (Signed)
 Spoke with Dr. Sigdel at the bedside at approx 2345.  Per MD, resume blood draws at 1600.

## 2024-02-25 NOTE — Progress Notes (Signed)
 Occupational Therapy Treatment Patient Details Name: Matthew Calderon MRN: [id number] DOB: Jun 07, 1933 Today's Date: 02/25/2024   History of present illness 89 y.o. male presents to [facility] hospital on 02/19/2024 with recent falls and type II dens fx, brought back in with fever and AMS. Admitted for sepsis due to multifocal PNA, metabolic encephalopathy. Hospital course complicated by new onset afib with RVR 8/30 that converted to SR 8/31. PMH includes CAD, CHF, DMII, HTN, HLD, OSA, mood disorder.   OT comments  Pt progressing towards goals. Pt received in bed without cervical collar one. Educated pt on importance of wearing collar at all times. Progressed to complete functional transfer with mod assist. Pt continues to be limited by decreased BUE/BLE strength and also cog. Continue to recommend <3 hours of skilled rehab daily to optimize independence levels. Will continue to follow acutely.       If plan is discharge home, recommend the following:  A little help with walking and/or transfers;A lot of help with bathing/dressing/bathroom;Assistance with cooking/housework;Direct supervision/assist for medications management;Direct supervision/assist for financial management;Assist for transportation;Help with stairs or ramp for entrance   Equipment Recommendations  Other (comment) (defer to next venue)    Recommendations for Other Services      Precautions / Restrictions Precautions Precautions: Fall;Cervical Precaution Booklet Issued: No Recall of Precautions/Restrictions: Impaired Required Braces or Orthoses: Cervical Brace Cervical Brace: Hard collar;At all times Restrictions Weight Bearing Restrictions Per Provider Order: No       Mobility Bed Mobility Overal bed mobility: Needs Assistance Bed Mobility: Rolling, Sidelying to Sit Rolling: Mod assist, Used rails Sidelying to sit: HOB elevated, Used rails, Mod assist       General bed mobility comments: Pt requiring both verbal and  tactile cues to initate log rolling    Transfers Overall transfer level: Needs assistance Equipment used: Rolling walker (2 wheels) Transfers: Sit to/from Stand, Bed to chair/wheelchair/BSC Sit to Stand: Mod assist     Step pivot transfers: Mod assist     General transfer comment: mod assist to come to stand, once in standing, posterior lean and requiring step by step cues for sequencing     Balance Overall balance assessment: Needs assistance Sitting-balance support: No upper extremity supported, Feet supported Sitting balance-Leahy Scale: Poor Sitting balance - Comments: Posterior lean requiring close CGA Postural control: Posterior lean Standing balance support: Bilateral upper extremity supported, Reliant on assistive device for balance Standing balance-Leahy Scale: Poor Standing balance comment: dependent on BUE         ADL either performed or assessed with clinical judgement   ADL Overall ADL's : Needs assistance/impaired       Toilet Transfer: Moderate assistance;Rolling walker (2 wheels);Stand-pivot Statistician Details (indicate cue type and reason): Simulated in room short step pivot to recliner         Functional mobility during ADLs: Moderate assistance;Rolling walker (2 wheels)      Extremity/Trunk Assessment Upper Extremity Assessment Upper Extremity Assessment: Generalized weakness   Lower Extremity Assessment Lower Extremity Assessment: Defer to PT evaluation        Vision   Additional Comments: hx of catacrats, pt having difficulty locating TV and         Communication Communication Communication: Impaired Factors Affecting Communication: Hearing impaired   Cognition Arousal: Alert Behavior During Therapy: WFL for tasks assessed/performed Cognition: Cognition impaired   Orientation impairments: Time, Situation Awareness: Intellectual awareness impaired Memory impairment (select all impairments): Short-term memory, Working [employer] fast streamer,  Engineer, structural memory Attention impairment (select first  level of impairment): Sustained attention Executive functioning impairment (select all impairments): Initiation, Sequencing, Reasoning, Problem solving, Organization OT - Cognition Comments: Pt oriented to self and place, often perseverating on tasks, needing cues for cessation     Following commands: Impaired Following commands impaired: Follows one step commands with increased time      Cueing   Cueing Techniques: Verbal cues        General Comments Noted redness and skin breakdown in pt's inner thighs, RN notified    Pertinent Vitals/ Pain       Pain Assessment Pain Assessment: Faces Faces Pain Scale: Hurts little more Pain Location: L wrist Pain Descriptors / Indicators: Grimacing Pain Intervention(s): Monitored during session   Frequency  Min 2X/week        Progress Toward Goals  OT Goals(current goals can now be found in the care plan section)  Progress towards OT goals: Progressing toward goals  Acute Rehab OT Goals Patient Stated Goal: To get better OT Goal Formulation: With patient Time For Goal Achievement: 03/05/24 Potential to Achieve Goals: Good ADL Goals Pt Will Perform Grooming: with supervision;standing Pt Will Perform Upper Body Dressing: with set-up;sitting Pt Will Transfer to Toilet: with supervision;ambulating;bedside commode Pt Will Perform Toileting - Clothing Manipulation and hygiene: with supervision;sit to/from stand;sitting/lateral leans Additional ADL Goal #1: Pt will recall and adhere to cervical precautions with supervision during ADL routine.  Plan      AM-PAC OT 6 Clicks Daily Activity     Outcome Measure   Help from another person eating meals?: None Help from another person taking care of personal grooming?: A Little Help from another person toileting, which includes using toliet, bedpan, or urinal?: A Lot Help from another person bathing (including washing,  rinsing, drying)?: A Lot Help from another person to put on and taking off regular upper body clothing?: A Lot Help from another person to put on and taking off regular lower body clothing?: A Lot 6 Click Score: 15    End of Session Equipment Utilized During Treatment: Gait belt;Rolling walker (2 wheels);Cervical collar  OT Visit Diagnosis: Other abnormalities of gait and mobility (R26.89);Muscle weakness (generalized) (M62.81);History of falling (Z91.81);Other symptoms and signs involving cognitive function   Activity Tolerance Patient tolerated treatment well   Patient Left in chair;with call bell/phone within reach;with chair alarm set   Nurse Communication Mobility status;Other (comment);Precautions        Time: 1346-1406 OT Time Calculation (min): 20 min  Charges: OT General Charges $OT Visit: 1 Visit OT Treatments $Self Care/Home Management : 8-22 mins  Adrianne BROCKS, OT  Acute Rehabilitation Services Office [phone] Secure chat preferred   Adrianne GORMAN Savers 02/25/2024, 2:29 PM

## 2024-02-25 NOTE — Inpatient Diabetes Management (Signed)
 Inpatient Diabetes Program Recommendations  AACE/ADA: New Consensus Statement on Inpatient Glycemic Control (2015)  Target Ranges:  Prepandial:   less than 140 mg/dL      Peak postprandial:   less than 180 mg/dL (1-2 hours)      Critically ill patients:  140 - 180 mg/dL   Lab Results  Component Value Date   GLUCAP 116 (H) 02/25/2024   HGBA1C 10.1 (H) 02/20/2024   Attempted to see patient this morning regarding DKA.  He is sleeping heavily right now.  Reached out to RN asking if he was appropriate for a diet.  She states he has been lethargic today and will be having a MBS at some point.  If patient is able to eat, it may help clear his acidosis.     Discharge Recommendations: Other recommendations: Tradjenta 5 mg QD, Metformin  1000 mg QD, Amaryl 2 mg QAM with breakfast.  Follow up with PCP.   Use Adult Diabetes Insulin  Treatment Post Discharge order set.   Thank you, Wyvonna Pinal, MSN, CDCES Diabetes Coordinator Inpatient Diabetes Program [phone] (team pager from 8a-5p)

## 2024-02-25 NOTE — TOC Progression Note (Addendum)
 Transition of Care [facility]) - Progression Note    Patient Details  Name: Matthew Calderon MRN: [id number] Date of Birth: 08-Jul-1932  [facility] CM/SW Contact  Luise JAYSON Pan, [place] Phone Number: 02/25/2024, 2:05 PM  Clinical Narrative:   Per MD yesterday, patient will potentially be ready for DC in 48-72 hrs.   CSW submitted for insurance auth but cannot see details in [place]. CSW called navi with [employer] and discussed issue. Per [employer], this is a reoccuring issue and mylene will submit a ticket to their IT department. Per the rep, CSW should be able to see results in 24 hours. [employer] Representative provided auth approval details at this time.  Insurance approval details for [employer]: [place] id [id number] - Plan auth id [id number] - Approval dates: 02/27/2024 - 03/02/2024   CSW will continue to follow.    Expected Discharge Plan: Skilled Nursing Facility Barriers to Discharge: Continued Medical Work up               Expected Discharge Plan and Services In-house Referral: Clinical Social Work     Living arrangements for the past 2 months: Assisted Living Facility                                       Social Drivers of Health (SDOH) Interventions SDOH Screenings   Food Insecurity: No Food Insecurity (08/12/2023)  Housing: Low Risk  (08/12/2023)  Transportation Needs: No Transportation Needs (08/12/2023)  Utilities: Not At Risk (08/12/2023)  Social Connections: Unknown (08/12/2023)  Tobacco Use: Medium Risk (02/19/2024)    Readmission Risk Interventions     No data to display

## 2024-02-25 NOTE — Plan of Care (Signed)
  Problem: Education: Goal: Ability to describe self-care measures that may prevent or decrease complications (Diabetes Survival Skills Education) will improve Outcome: Progressing Goal: Individualized Educational Video(s) Outcome: Progressing   Problem: Coping: Goal: Ability to adjust to condition or change in health will improve Outcome: Progressing   Problem: Fluid Volume: Goal: Ability to maintain a balanced intake and output will improve Outcome: Progressing   Problem: Health Behavior/Discharge Planning: Goal: Ability to identify and utilize available resources and services will improve Outcome: Progressing Goal: Ability to manage health-related needs will improve Outcome: Progressing   Problem: Metabolic: Goal: Ability to maintain appropriate glucose levels will improve Outcome: Progressing   Problem: Nutritional: Goal: Maintenance of adequate nutrition will improve Outcome: Progressing Goal: Progress toward achieving an optimal weight will improve Outcome: Progressing   Problem: Skin Integrity: Goal: Risk for impaired skin integrity will decrease Outcome: Progressing   Problem: Tissue Perfusion: Goal: Adequacy of tissue perfusion will improve Outcome: Progressing   Problem: Education: Goal: Knowledge of General Education information will improve Description: Including pain rating scale, medication(s)/side effects and non-pharmacologic comfort measures Outcome: Progressing   Problem: Health Behavior/Discharge Planning: Goal: Ability to manage health-related needs will improve Outcome: Progressing   Problem: Clinical Measurements: Goal: Ability to maintain clinical measurements within normal limits will improve Outcome: Progressing Goal: Will remain free from infection Outcome: Progressing Goal: Diagnostic test results will improve Outcome: Progressing Goal: Respiratory complications will improve Outcome: Progressing Goal: Cardiovascular complication will  be avoided Outcome: Progressing   Problem: Activity: Goal: Risk for activity intolerance will decrease Outcome: Progressing   Problem: Nutrition: Goal: Adequate nutrition will be maintained Outcome: Progressing   Problem: Coping: Goal: Level of anxiety will decrease Outcome: Progressing   Problem: Elimination: Goal: Will not experience complications related to bowel motility Outcome: Progressing Goal: Will not experience complications related to urinary retention Outcome: Progressing   Problem: Pain Managment: Goal: General experience of comfort will improve and/or be controlled Outcome: Progressing   Problem: Safety: Goal: Ability to remain free from injury will improve Outcome: Progressing   Problem: Skin Integrity: Goal: Risk for impaired skin integrity will decrease Outcome: Progressing   Problem: Education: Goal: Ability to describe self-care measures that may prevent or decrease complications (Diabetes Survival Skills Education) will improve Outcome: Progressing Goal: Individualized Educational Video(s) Outcome: Progressing   Problem: Cardiac: Goal: Ability to maintain an adequate cardiac output will improve Outcome: Progressing   Problem: Health Behavior/Discharge Planning: Goal: Ability to identify and utilize available resources and services will improve Outcome: Progressing Goal: Ability to manage health-related needs will improve Outcome: Progressing   Problem: Fluid Volume: Goal: Ability to achieve a balanced intake and output will improve Outcome: Progressing   Problem: Metabolic: Goal: Ability to maintain appropriate glucose levels will improve Outcome: Progressing   Problem: Nutritional: Goal: Maintenance of adequate nutrition will improve Outcome: Progressing Goal: Maintenance of adequate weight for body size and type will improve Outcome: Progressing   Problem: Respiratory: Goal: Will regain and/or maintain adequate  ventilation Outcome: Progressing   Problem: Urinary Elimination: Goal: Ability to achieve and maintain adequate renal perfusion and functioning will improve Outcome: Progressing

## 2024-02-25 NOTE — Progress Notes (Signed)
 Physical Therapy Treatment Patient Details Name: Matthew Calderon MRN: [id number] DOB: 01-Jan-1933 Today's Date: 02/25/2024   History of Present Illness 89 y.o. male presents to [facility] hospital on 02/19/2024 with recent falls and type II dens fx, brought back in with fever and AMS. Admitted for sepsis due to multifocal PNA, metabolic encephalopathy. Hospital course complicated by new onset afib with RVR 8/30 that converted to SR 8/31. PMH includes CAD, CHF, DMII, HTN, HLD, OSA, mood disorder.    PT Comments  Pt up to chair with OT earlier and able to amb short distance in hallway with me assist. Requires hands on assist for all mobility. Patient will benefit from continued inpatient follow up therapy, <3 hours/day     If plan is discharge home, recommend the following: A lot of help with bathing/dressing/bathroom;Assistance with cooking/housework;Assist for transportation;Help with stairs or ramp for entrance;Direct supervision/assist for medications management;Direct supervision/assist for financial management;Supervision due to cognitive status;A lot of help with walking and/or transfers   Can travel by private vehicle     No  Equipment Recommendations  BSC/3in1;Wheelchair (measurements PT);Wheelchair cushion (measurements PT)    Recommendations for Other Services       Precautions / Restrictions Precautions Precautions: Fall;Cervical Precaution Booklet Issued: No Recall of Precautions/Restrictions: Impaired Required Braces or Orthoses: Cervical Brace Cervical Brace: Hard collar;At all times Restrictions Weight Bearing Restrictions Per Provider Order: No     Mobility  Bed Mobility               General bed mobility comments: Pt up in recliner    Transfers Overall transfer level: Needs assistance Equipment used: Rolling walker (2 wheels) Transfers: Sit to/from Stand Sit to Stand: Min assist           General transfer comment: Assist to power up and stabilize     Ambulation/Gait Ambulation/Gait assistance: Min assist, +2 safety/equipment Gait Distance (Feet): 25 Feet (5' x 1, 25' x 1) Assistive device: Rolling walker (2 wheels) Gait Pattern/deviations: Step-through pattern, Decreased step length - right, Decreased step length - left, Shuffle Gait velocity: decr Gait velocity interpretation: <1.31 ft/sec, indicative of household ambulator   General Gait Details: Assist for balance and support. Flexed posture and intermittent assist to manage walker.   Stairs             Wheelchair Mobility     Tilt Bed    Modified Rankin (Stroke Patients Only)       Balance Overall balance assessment: Needs assistance Sitting-balance support: Bilateral upper extremity supported, Feet supported Sitting balance-Leahy Scale: Poor Sitting balance - Comments: UE support   Standing balance support: Bilateral upper extremity supported, Reliant on assistive device for balance Standing balance-Leahy Scale: Poor Standing balance comment: walker and min assist for static standing                            Communication Communication Communication: Impaired Factors Affecting Communication: Hearing impaired;Reduced clarity of speech  Cognition Arousal: Alert Behavior During Therapy: WFL for tasks assessed/performed   PT - Cognitive impairments: No family/caregiver present to determine baseline, Orientation, Awareness, Memory, Attention, Problem solving, Sequencing, Initiation, Safety/Judgement                         Following commands: Impaired Following commands impaired: Follows one step commands with increased time    Cueing Cueing Techniques: Verbal cues  Exercises      General Comments General  comments (skin integrity, edema, etc.): Noted redness and skin breakdown in pt's inner thighs, RN notified      Pertinent Vitals/Pain Pain Assessment Pain Assessment: Faces Faces Pain Scale: Hurts a little bit Pain  Location: generalized Pain Descriptors / Indicators: Grimacing Pain Intervention(s): Limited activity within patient's tolerance, Monitored during session, Repositioned    Home Living                          Prior Function            PT Goals (current goals can now be found in the care plan section) Progress towards PT goals: Progressing toward goals    Frequency    Min 2X/week      PT Plan      Co-evaluation              AM-PAC PT 6 Clicks Mobility   Outcome Measure  Help needed turning from your back to your side while in a flat bed without using bedrails?: A Lot Help needed moving from lying on your back to sitting on the side of a flat bed without using bedrails?: A Lot Help needed moving to and from a bed to a chair (including a wheelchair)?: A Lot Help needed standing up from a chair using your arms (e.g., wheelchair or bedside chair)?: A Little Help needed to walk in hospital room?: A Little Help needed climbing 3-5 steps with a railing? : Total 6 Click Score: 13    End of Session Equipment Utilized During Treatment: Gait belt;Cervical collar Activity Tolerance: Patient tolerated treatment well Patient left: in chair;with call bell/phone within reach;with chair alarm set Nurse Communication: Mobility status PT Visit Diagnosis: Other abnormalities of gait and mobility (R26.89);Muscle weakness (generalized) (M62.81);History of falling (Z91.81)     Time: 8467-8441 PT Time Calculation (min) (ACUTE ONLY): 26 min  Charges:    $Gait Training: 23-37 mins PT General Charges $$ ACUTE PT VISIT: 1 Visit                     [facility] PT Acute Rehabilitation Services Office [phone]    Rodgers ORN [facility] 02/25/2024, 4:29 PM

## 2024-02-25 NOTE — Progress Notes (Signed)
 SLP Cancellation Note  Patient Details Name: Matthew Calderon MRN: [id number] DOB: 03/13/1933   Cancelled treatment:       Reason Eval/Treat Not Completed: Patient's level of consciousness Per RN communication with radiology, patient too lethargic to participate in MBS. SLP will follow for readiness. Please note he currently has no alternative means of nutrition.   Norleen IVAR Blase, MA, CCC-SLP Speech Therapy

## 2024-02-25 NOTE — Progress Notes (Addendum)
 PROGRESS NOTE    Matthew Calderon  FMW:[id number] DOB: 27-Nov-1932 DOA: 02/19/2024 PCP: Caleen Dirks, MD   Brief Narrative:    Assessment & Plan:   Principal Problem:   CAP (community acquired pneumonia) Active Problems:   Non-ST elevation (NSTEMI) myocardial infarction (HCC)   Sepsis (HCC)   Atrial fibrillation, rapid (HCC)   Acute metabolic encephalopathy   Myocardial injury   Recurrent falls   Atherosclerosis of aorta (HCC)   Coronary artery calcification   Heart failure with improved ejection fraction (HFimpEF) (HCC)   Multifocal pneumonia   Atrial fibrillation with RVR (HCC)   Brief Narrative:    89 year old with history of CAD, CHF, DM2, HTN, HLD, OSA, disorder, recurrent falls comes to the ED from [facility] came to the ED on 8/27 from a ground-level fall found to have type II dens fracture seen by neurosurgery and placed in hard collar with plans to follow-up outpatient in 2 weeks.  But now brought back to the hospital due to concerns of fever and change in mental status.  COVID/RSV/flu were negative.  CT chest abdomen pelvis shows concerns of bilateral patchy opacity, CT head negative for acute pathology, CT cervical spine is also negative.   Patient initially was in euglycemic DKA secondary Trijardy requiring insulin  drip and was transitioned to subcu but again on 9/1 went back into euglycemic DKA requiring Endo tool.    Assessment/plan: Euglycemic diabetic ketoacidosis Uncontrolled DM 2 secondary to hyperglycemia Severe acidosis and euglycemic DKA suspected secondary to Trijardy.  Required insulin  drip which has now been transitioned to subcu sliding scale and long-acting.  Patient went back into euglycemic DKA on 9/1 requiring DKA protocol/Endo tool.  Anion gap improved however with discontinuation of IV fluid and insulin  overnight 9/2 patient has become acidotic with increasing anion gap. Endo tool reinitiated 9/3 -A1c 10.1.     Dysphagia - Speech and swallow  evaluation once his mentation has better.  Currently he is n.p.o.  Sepsis, multifocal pneumonia -Concerns of multifocal pneumonia seen on CT chest - Completed 3 days of azithromycin , will complete 7 days of Rocephin  - COVID/flu/RSV negative - Bronchodilators, I-S/flutter valve.  Supportive care   Recent ground-level fall complicated by type II dens fracture Repeat CT C-spine demonstrates slightly more displaced type II dens with 2 mm distraction. Clinically neurologically intact in all extremities Seen by neurosurgery, Dr. [name].  Hard c-collar adjusted to correct fitting.  Outpatient follow-up.   Encephalopathy, metabolic suspected setting of underlying infection Dementia CT head with no acute intracranial abnormality.  Likely in the setting of infection and acidosis.  B12 normal - TSH and ammonia are normal   Atrial fibrillation with RVR - Likely from undergoing stress briefly requiring amiodarone  drip.  Now transition to Coreg .  Dr Caleen discussed with daughter, long-term anticoagulation deferred due to risk of fall   Elevated troponin Coronary artery disease -Initially significantly elevated troponins CT showing evidence of 4 vessel disease.  Currently suspect demand ischemia.  Echocardiogram shows preserved EF, grade 3 aortic root plaque.  Completed 48 hours of heparin  drip.  Aspirin  has been changed to Plavix .   Acute kidney injury resolved Baseline creatinine 0.7 - 1, elevated to 1.5 on admission.  Continue IV fluids per DKA protocol   Isolated elevation in T Bili  -Trend LFT   Incidental findings: Aortic atherosclerosis, 4 vessel CAD, aortic valve calcification Cholelithiasis, diverticulosis, small hiatal hernia, prostamegaly   CHF with EF 55% Resume Coreg .  Hold Entresto .   Hypertension Slowly resuming  home medications Hyperlipidemia See CAD   OSA  Not on CPAP   Mood disorder  Continue home sertraline    Recurrent falls PT/OT-SNF   DVT prophylaxis: Lovenox .      Code Status: Full Code   Continue inpatient   Consultants:   Procedures:   Antimicrobials:    Subjective:  Patient seen and examined at the bedside.  He is confused, has cervical collar.  Appears dehydrated.  Blood sugars have been under control but beta-hydroxybutyrate elevated more than 8 along with increasing anion gap 25 this morning concerning for ongoing DKA.  Reinitiated IV insulin  as well as IV fluids per protocol.  Objective: Vitals:   02/25/24 0015 02/25/24 0348 02/25/24 0735 02/25/24 1100  BP: (!) 153/77 (!) 149/77 139/71 120/68  Pulse: (!) 107 (!) 109 (!) 108 (!) 107  Resp: (!) 23 19 20 20   Temp: 100.2 F (37.9 C) 98.1 F (36.7 C) 98 F (36.7 C) 98.1 F (36.7 C)  TempSrc: Axillary Oral Oral Oral  SpO2: 94% 97% 98% 96%  Weight: 70.9 kg     Height:        Intake/Output Summary (Last 24 hours) at 02/25/2024 1442 Last data filed at 02/25/2024 1303 Gross per 24 hour  Intake 7.65 ml  Output 2500 ml  Net -2492.35 ml   Filed Weights   02/23/24 0500 02/24/24 0457 02/25/24 0015  Weight: 69.4 kg 72.2 kg 70.9 kg    Examination:  General exam: Appears calm and comfortable  Respiratory system: Bilateral decreased breath sounds at bases Cardiovascular system: S1 & S2 heard, Rate controlled Gastrointestinal system: Abdomen is nondistended, soft and nontender. Normal bowel sounds heard. Extremities: No cyanosis, clubbing, edema  Central nervous system: Alert and oriented. No focal neurological deficits. Moving extremities Skin: No rashes, lesions or ulcers Psychiatry: Judgement and insight appear normal. Mood & affect appropriate.     Data Reviewed: I have personally reviewed following labs and imaging studies  CBC: Recent Labs  Lab 02/19/24 2010 02/20/24 0322 02/21/24 0313 02/22/24 0943 02/23/24 0256 02/24/24 0440 02/25/24 0834  WBC 16.6*   < > 10.1 9.1 10.0 9.8 11.7*  NEUTROABS 14.3*  --   --   --   --   --   --   HGB 14.2   < > 11.2* 12.2* 11.4*  10.1* 11.2*  HCT 44.3   < > 33.4* 37.0* 33.8* 30.4* 34.3*  MCV 93.7   < > 89.1 89.2 87.8 89.1 89.6  PLT 213   < > 141* 143* 144* 136* 166   < > = values in this interval not displayed.   Basic Metabolic Panel: Recent Labs  Lab 02/20/24 0600 02/20/24 0858 02/24/24 0031 02/24/24 0617 02/24/24 1328 02/24/24 1956 02/25/24 0834  NA 140   < > 138 139 139 138 141  K 4.6   < > 3.6 3.2* 3.6 3.7 3.5  CL 109   < > 112* 110 110 110 110  CO2 <7*   < > 14* 16* 11* 13* 8*  GLUCOSE 217*   < > 114* 116* 147* 113* 110*  BUN 26*   < > <5* 7* 5* 7* 9  CREATININE 1.60*   < > 1.04 0.93 0.93 0.95 1.13  CALCIUM  8.4*   < > 7.5* 7.6* 7.7* 7.7* 7.6*  MG 1.9  --   --  1.7  --   --  2.0  PHOS 4.7*  --   --   --   --   --   --    < > =  values in this interval not displayed.   GFR: Estimated Creatinine Clearance: 35.7 mL/min (by C-G formula based on SCr of 1.13 mg/dL). Liver Function Tests: Recent Labs  Lab 02/19/24 2010 02/20/24 1309  AST 17 19  ALT 15 10  ALKPHOS 90 78  BILITOT 1.8* 1.2  PROT 6.9 6.1*  ALBUMIN 3.5 3.0*   No results for input(s): LIPASE, AMYLASE in the last 168 hours. Recent Labs  Lab 02/20/24 0911  AMMONIA 17   Coagulation Profile: Recent Labs  Lab 02/19/24 2010  INR 1.4*   Cardiac Enzymes: No results for input(s): CKTOTAL, CKMB, CKMBINDEX, TROPONINI in the last 168 hours. BNP (last 3 results) No results for input(s): PROBNP in the last 8760 hours. HbA1C: No results for input(s): HGBA1C in the last 72 hours. CBG: Recent Labs  Lab 02/25/24 0909 02/25/24 1013 02/25/24 1126 02/25/24 1228 02/25/24 1326  GLUCAP 95 103* 116* 133* 155*   Lipid Profile: No results for input(s): CHOL, HDL, LDLCALC, TRIG, CHOLHDL, LDLDIRECT in the last 72 hours. Thyroid  Function Tests: No results for input(s): TSH, T4TOTAL, FREET4, T3FREE, THYROIDAB in the last 72 hours. Anemia Panel: No results for input(s): VITAMINB12, FOLATE,  FERRITIN, TIBC, IRON, RETICCTPCT in the last 72 hours. Sepsis Labs: Recent Labs  Lab 02/20/24 0858 02/20/24 1309 02/23/24 0256 02/23/24 1159 02/23/24 1534  PROCALCITON  --  3.72 0.67  --   --   LATICACIDVEN 2.6* 2.3*  --  0.8 0.9    Recent Results (from the past 240 hours)  Blood Culture (routine x 2)     Status: None   Collection Time: 02/19/24  8:09 PM   Specimen: BLOOD RIGHT FOREARM  Result Value Ref Range Status   Specimen Description BLOOD RIGHT FOREARM  Final   Special Requests   Final    BOTTLES DRAWN AEROBIC AND ANAEROBIC Blood Culture adequate volume   Culture   Final    NO GROWTH 5 DAYS Performed at [facility], [address] [place], [place] [address]    Report Status 02/24/2024 FINAL  Final  Blood Culture (routine x 2)     Status: None   Collection Time: 02/19/24  8:51 PM   Specimen: BLOOD  Result Value Ref Range Status   Specimen Description BLOOD RIGHT ANTECUBITAL  Final   Special Requests   Final    BOTTLES DRAWN AEROBIC AND ANAEROBIC Blood Culture adequate volume   Culture   Final    NO GROWTH 5 DAYS Performed at [facility], [address] [place], [place] [address]    Report Status 02/24/2024 FINAL  Final  Resp panel by RT-PCR (RSV, Flu A&B, Covid) Anterior Nasal Swab     Status: None   Collection Time: 02/19/24  8:52 PM   Specimen: Anterior Nasal Swab  Result Value Ref Range Status   SARS Coronavirus 2 by RT PCR NEGATIVE NEGATIVE Final   Influenza A by PCR NEGATIVE NEGATIVE Final   Influenza B by PCR NEGATIVE NEGATIVE Final    Comment: (NOTE) The Xpert Xpress SARS-CoV-2/FLU/RSV plus assay is intended as an aid in the diagnosis of influenza from Nasopharyngeal swab specimens and should not be used as a sole basis for treatment. Nasal washings and aspirates are unacceptable for Xpert Xpress SARS-CoV-2/FLU/RSV testing.  Fact Sheet for Patients: BloggerCourse.com  Fact Sheet for Healthcare  Providers: SeriousBroker.it  This test is not yet approved or cleared by the [place]  FDA and has been authorized for detection and/or diagnosis of SARS-CoV-2 by  FDA under an Emergency Use Authorization (EUA). This EUA will remain in effect (meaning this test can be used) for the duration of the COVID-19 declaration under Section 564(b)(1) of the Act, 21 U.S.C. section 360bbb-3(b)(1), unless the authorization is terminated or revoked.     Resp Syncytial Virus by PCR NEGATIVE NEGATIVE Final    Comment: (NOTE) Fact Sheet for Patients: BloggerCourse.com  Fact Sheet for Healthcare Providers: SeriousBroker.it  This test is not yet approved or cleared by the [place]  FDA and has been authorized for detection and/or diagnosis of SARS-CoV-2 by FDA under an Emergency Use Authorization (EUA). This EUA will remain in effect (meaning this test can be used) for the duration of the COVID-19 declaration under Section 564(b)(1) of the Act, 21 U.S.C. section 360bbb-3(b)(1), unless the authorization is terminated or revoked.  Performed at [facility], [address] [place], [place] [address]   Respiratory (~20 pathogens) panel by PCR     Status: None   Collection Time: 02/20/24  3:40 AM   Specimen: Nasopharyngeal Swab; Respiratory  Result Value Ref Range Status   Adenovirus NOT DETECTED NOT DETECTED Final   Coronavirus 229E NOT DETECTED NOT DETECTED Final    Comment: (NOTE) The Coronavirus on the Respiratory Panel, DOES NOT test for the novel  Coronavirus (2019 nCoV)    Coronavirus HKU1 NOT DETECTED NOT DETECTED Final   Coronavirus NL63 NOT DETECTED NOT DETECTED Final   Coronavirus OC43 NOT DETECTED NOT DETECTED Final   Metapneumovirus NOT DETECTED NOT DETECTED Final   Rhinovirus / Enterovirus NOT DETECTED NOT DETECTED Final   Influenza A NOT DETECTED NOT DETECTED Final   Influenza B NOT DETECTED  NOT DETECTED Final   Parainfluenza Virus 1 NOT DETECTED NOT DETECTED Final   Parainfluenza Virus 2 NOT DETECTED NOT DETECTED Final   Parainfluenza Virus 3 NOT DETECTED NOT DETECTED Final   Parainfluenza Virus 4 NOT DETECTED NOT DETECTED Final   Respiratory Syncytial Virus NOT DETECTED NOT DETECTED Final   Bordetella pertussis NOT DETECTED NOT DETECTED Final   Bordetella Parapertussis NOT DETECTED NOT DETECTED Final   Chlamydophila pneumoniae NOT DETECTED NOT DETECTED Final   Mycoplasma pneumoniae NOT DETECTED NOT DETECTED Final    Comment: Performed at [facility], [address], [place], [place] [address]  MRSA Next Gen by PCR, Nasal     Status: None   Collection Time: 02/20/24  3:40 AM   Specimen: Nasal Mucosa; Nasal Swab  Result Value Ref Range Status   MRSA by PCR Next Gen NOT DETECTED NOT DETECTED Final    Comment: (NOTE) The GeneXpert MRSA Assay (FDA approved for NASAL specimens only), is one component of a comprehensive MRSA colonization surveillance program. It is not intended to diagnose MRSA infection nor to guide or monitor treatment for MRSA infections. Test performance is not FDA approved in patients less than 65 years old. Performed at [facility], [address], [place], [place] [address]          Radiology Studies: No results found.      Scheduled Meds:  atorvastatin   40 mg Oral QPM   brimonidine   1 drop Both Eyes BID   carvedilol   6.25 mg Oral BID WC   Chlorhexidine  Gluconate Cloth  6 each Topical Daily   cholecalciferol   1,000 Units Oral Daily   finasteride   5 mg Oral QPM   heparin  injection (subcutaneous)  5,000 Units Subcutaneous Q8H   sertraline   50 mg Oral QPM  sodium chloride  flush  10-40 mL Intracatheter Q12H   sodium chloride  flush  3 mL Intravenous Q12H   tamsulosin   0.4 mg Oral QHS   timolol   1 drop Both Eyes BID   Continuous Infusions:  cefTRIAXone  (ROCEPHIN )  IV Stopped (02/25/24 1114)   dextrose  5% lactated ringers   125 mL/hr at 02/25/24 1128   insulin  1 Units/hr (02/25/24 1437)   potassium chloride  10 mEq (02/25/24 1433)          Derryl Duval, MD [facility] 02/25/2024, 2:42 PM

## 2024-02-25 NOTE — Progress Notes (Signed)
Labs redrawn at this time.

## 2024-02-25 NOTE — Progress Notes (Signed)
 Labs resulted under 02/25/24 @ 1041 were drawn 02/25/24 @ 1615.  Timing of labs are off.  Spoke with the MD at the bedside earlier and he said to draw repeat labs at 1600, then resume the Q4hours

## 2024-02-25 NOTE — Plan of Care (Signed)
  Problem: Coping: Goal: Ability to adjust to condition or change in health will improve Outcome: Progressing   Problem: Activity: Goal: Risk for activity intolerance will decrease Outcome: Progressing   Problem: Coping: Goal: Level of anxiety will decrease Outcome: Progressing

## 2024-02-25 NOTE — Progress Notes (Signed)
 NSG 0700: Shift change report completed.  Insulin  gtt is OFF during rounds, next CBG due at 0756 per endotool.  Prior shift's RN documentation shows the insulin  gtt is infusing at 0.2u/hr which is not accurate.  Pt only has NS @ KVO 10ml/hr infusing.  Pt resting comfortably in bed, C-collar on, no distress noted.  Bed alarm on and call bell within reach

## 2024-02-26 LAB — CBC
HCT: 30.4 % — ABNORMAL LOW (ref 39.0–52.0)
Hemoglobin: 10.3 g/dL — ABNORMAL LOW (ref 13.0–17.0)
MCH: 29.6 pg (ref 26.0–34.0)
MCHC: 33.9 g/dL (ref 30.0–36.0)
MCV: 87.4 fL (ref 80.0–100.0)
Platelets: 160 K/uL (ref 150–400)
RBC: 3.48 MIL/uL — ABNORMAL LOW (ref 4.22–5.81)
RDW: 14.6 % (ref 11.5–15.5)
WBC: 8 K/uL (ref 4.0–10.5)
nRBC: 0 % (ref 0.0–0.2)

## 2024-02-26 LAB — BASIC METABOLIC PANEL WITH GFR
Anion gap: 13 (ref 5–15)
Anion gap: 12 (ref 5–15)
Anion gap: 11 (ref 5–15)
BUN: 6 mg/dL — ABNORMAL LOW (ref 8–23)
BUN: 5 mg/dL — ABNORMAL LOW (ref 8–23)
BUN: <5 mg/dL — ABNORMAL LOW (ref 8–23)
CO2: 14 mmol/L — ABNORMAL LOW (ref 22–32)
CO2: 14 mmol/L — ABNORMAL LOW (ref 22–32)
CO2: 17 mmol/L — ABNORMAL LOW (ref 22–32)
Calcium: 7.7 mg/dL — ABNORMAL LOW (ref 8.9–10.3)
Calcium: 7.6 mg/dL — ABNORMAL LOW (ref 8.9–10.3)
Calcium: 7.7 mg/dL — ABNORMAL LOW (ref 8.9–10.3)
Chloride: 114 mmol/L — ABNORMAL HIGH (ref 98–111)
Chloride: 114 mmol/L — ABNORMAL HIGH (ref 98–111)
Chloride: 111 mmol/L (ref 98–111)
Creatinine, Ser: 1.04 mg/dL (ref 0.61–1.24)
Creatinine, Ser: 0.86 mg/dL (ref 0.61–1.24)
Creatinine, Ser: 0.77 mg/dL (ref 0.61–1.24)
GFR, Estimated: >60 mL/min (ref >60)
GFR, Estimated: >60 mL/min (ref >60)
GFR, Estimated: >60 mL/min (ref >60)
Glucose, Bld: 197 mg/dL — ABNORMAL HIGH (ref 70–99)
Glucose, Bld: 196 mg/dL — ABNORMAL HIGH (ref 70–99)
Glucose, Bld: 200 mg/dL — ABNORMAL HIGH (ref 70–99)
Potassium: 3.1 mmol/L — ABNORMAL LOW (ref 3.5–5.1)
Potassium: 5.1 mmol/L (ref 3.5–5.1)
Potassium: 3.6 mmol/L (ref 3.5–5.1)
Sodium: 141 mmol/L (ref 135–145)
Sodium: 140 mmol/L (ref 135–145)
Sodium: 139 mmol/L (ref 135–145)

## 2024-02-26 LAB — GLUCOSE, CAPILLARY
Glucose-Capillary: 163 mg/dL — ABNORMAL HIGH (ref 70–99)
Glucose-Capillary: 169 mg/dL — ABNORMAL HIGH (ref 70–99)
Glucose-Capillary: 173 mg/dL — ABNORMAL HIGH (ref 70–99)
Glucose-Capillary: 174 mg/dL — ABNORMAL HIGH (ref 70–99)
Glucose-Capillary: 175 mg/dL — ABNORMAL HIGH (ref 70–99)
Glucose-Capillary: 176 mg/dL — ABNORMAL HIGH (ref 70–99)
Glucose-Capillary: 176 mg/dL — ABNORMAL HIGH (ref 70–99)
Glucose-Capillary: 178 mg/dL — ABNORMAL HIGH (ref 70–99)
Glucose-Capillary: 178 mg/dL — ABNORMAL HIGH (ref 70–99)
Glucose-Capillary: 179 mg/dL — ABNORMAL HIGH (ref 70–99)
Glucose-Capillary: 179 mg/dL — ABNORMAL HIGH (ref 70–99)
Glucose-Capillary: 179 mg/dL — ABNORMAL HIGH (ref 70–99)
Glucose-Capillary: 180 mg/dL — ABNORMAL HIGH (ref 70–99)
Glucose-Capillary: 180 mg/dL — ABNORMAL HIGH (ref 70–99)
Glucose-Capillary: 182 mg/dL — ABNORMAL HIGH (ref 70–99)
Glucose-Capillary: 182 mg/dL — ABNORMAL HIGH (ref 70–99)
Glucose-Capillary: 182 mg/dL — ABNORMAL HIGH (ref 70–99)
Glucose-Capillary: 185 mg/dL — ABNORMAL HIGH (ref 70–99)
Glucose-Capillary: 187 mg/dL — ABNORMAL HIGH (ref 70–99)
Glucose-Capillary: 189 mg/dL — ABNORMAL HIGH (ref 70–99)
Glucose-Capillary: 190 mg/dL — ABNORMAL HIGH (ref 70–99)
Glucose-Capillary: 192 mg/dL — ABNORMAL HIGH (ref 70–99)
Glucose-Capillary: 196 mg/dL — ABNORMAL HIGH (ref 70–99)
Glucose-Capillary: 203 mg/dL — ABNORMAL HIGH (ref 70–99)

## 2024-02-26 LAB — MAGNESIUM: Magnesium: 1.8 mg/dL (ref 1.7–2.4)

## 2024-02-26 LAB — BETA-HYDROXYBUTYRIC ACID
Beta-Hydroxybutyric Acid: 2.68 mmol/L — ABNORMAL HIGH (ref 0.05–0.27)
Beta-Hydroxybutyric Acid: 1.51 mmol/L — ABNORMAL HIGH (ref 0.05–0.27)
Beta-Hydroxybutyric Acid: 0.22 mmol/L (ref 0.05–0.27)

## 2024-02-26 MED ORDER — KCL-LACTATED RINGERS-D5W 20 MEQ/L IV SOLN
INTRAVENOUS | Status: AC
  Filled 2024-02-26 (×3): qty 1000

## 2024-02-26 MED ORDER — POTASSIUM CHLORIDE 10 MEQ/100ML IV SOLN
10.0000 meq | INTRAVENOUS | Status: AC
  Administered 2024-02-26 (×4): 10 meq via INTRAVENOUS
  Filled 2024-02-26 (×3): qty 100

## 2024-02-26 MED ORDER — DILTIAZEM HCL 30 MG PO TABS
30.0000 mg | ORAL_TABLET | Freq: Four times a day (QID) | ORAL | Status: DC
  Administered 2024-02-26 – 2024-02-28 (×6): 30 mg via ORAL
  Filled 2024-02-26 (×7): qty 1

## 2024-02-26 MED ORDER — POTASSIUM CHLORIDE 10 MEQ/100ML IV SOLN
10.0000 meq | INTRAVENOUS | Status: AC
  Administered 2024-02-26 (×2): 10 meq via INTRAVENOUS
  Filled 2024-02-26 (×2): qty 100

## 2024-02-26 NOTE — Progress Notes (Signed)
 Pt. In afib with HR of 160s, 170s. Pt. K+ 3.5. On call for [facility] paged to make aware.

## 2024-02-26 NOTE — Progress Notes (Signed)
 Speech Language Pathology Treatment: Dysphagia  Patient Details Name: Matthew Calderon MRN: [id number] DOB: August 09, 1932 Today's Date: 02/26/2024 Time: 8940-8891 SLP Time Calculation (min) (ACUTE ONLY): 9 min  Assessment / Plan / Recommendation    Clinical Impression  Pt seen for ongoing dysphagia management.  Pt much more alert today.  Answering some questions earlier this morning around 745.  Now engaging in conversation.  Pt tolerated all consistencies trialed today without any overt s/s of aspiration.  He exhibited good clearance of regular solids.  Pt cannot transport to radiology today 2/2 IV medications and insulin /glucose management, but RN believes he will be able to complete study tomorrow.  Given pna on admission, pt's c/o subjective difficulty swallowing, and suspected aspiration event noted in chart by nursing will hold resumption of PO diet until after instrumental, but pt may have water and snacks from floorstock with nursing today if desired. Will plan for MBS next date.  Recommend pt remain NPO with alternate means of nutrition, hydration, and medication. Pt may have ice chips, unthickened water, and snacks from floor stock in moderation, after good oral care, when fully awake/alert, with upright positioning and direct supervision.    HPI HPI: Matthew Calderon is an 89 year old male who presented to the ED from [facility] came to the ED on 8/27 from a ground-level fall found to have type II dens fracture seen by neurosurgery and placed in hard collar with plans to follow-up outpatient in 2 weeks.  But now brought back to the hospital 8/28 due to concerns of fever and change in mental status. CT chest 8/28: Bilateral lower lobe patchy airspace opacities. Finding may represent atelectasis with superimposed infection.  COVID/RSV/influenza negative. Pt reporting nonproductive cough and dysphagia in ED.  Pt with history of CAD, CHF, DM2, HTN, HLD, OSA, disorder, recurrent falls. BSE completed  on 8/29 and was placed on regular thin with a MBS. Per radiology staff, RN was not able to come with pt at the time of his first schedule MBS. On 9/1, Diet NPO due to Diabetes Ketoacidosis (DKA). SLP to assess swallow function.      SLP Plan  MBS          Recommendations  Diet recommendations: NPO (My have water and snacks with nursing) Medication Administration: Via alternative means                  Oral care QID   Frequent or constant Supervision/Assistance Dysphagia, unspecified (R13.10)     MBS     Anette FORBES Grippe, MA, CCC-SLP Acute Rehabilitation Services Office: [phone] 02/26/2024, 11:20 AM

## 2024-02-26 NOTE — Progress Notes (Signed)
 PROGRESS NOTE    Matthew Calderon  FMW:[id number] DOB: [birth year] DOA: 02/19/2024 PCP: Caleen Dirks, MD   Brief Narrative:    Assessment & Plan:   Principal Problem:   CAP (community acquired pneumonia) Active Problems:   Non-ST elevation (NSTEMI) myocardial infarction (HCC)   Sepsis (HCC)   Atrial fibrillation, rapid (HCC)   Acute metabolic encephalopathy   Myocardial injury   Recurrent falls   Atherosclerosis of aorta (HCC)   Coronary artery calcification   Heart failure with improved ejection fraction (HFimpEF) (HCC)   Multifocal pneumonia   Atrial fibrillation with RVR (HCC)   Diabetic ketoacidosis without coma associated with type 2 diabetes mellitus (HCC)   Brief Narrative:    89 year old with history of CAD, CHF, DM2, HTN, HLD, OSA, disorder, recurrent falls comes to the ED from [facility] came to the ED on 8/27 from a ground-level fall found to have type II dens fracture seen by neurosurgery and placed in hard collar with plans to follow-up outpatient in 2 weeks.  But now brought back to the hospital due to concerns of fever and change in mental status.  COVID/RSV/flu were negative.  CT chest abdomen pelvis shows concerns of bilateral patchy opacity, CT head negative for acute pathology, CT cervical spine is also negative.   Patient initially was in euglycemic DKA secondary Trijardy requiring insulin  drip and was transitioned to subcu but again on 9/1 went back into euglycemic DKA requiring Endo tool.  Patient again went into DKA on 9/3    Assessment/plan: Euglycemic diabetic ketoacidosis Uncontrolled DM 2 secondary to hyperglycemia Severe acidosis and euglycemic DKA suspected secondary to Trijardy.  Required insulin  drip which has now been transitioned to subcu sliding scale and long-acting.  Patient went back into euglycemic DKA on 9/1 requiring DKA protocol/Endo tool.  Anion gap improved however with discontinuation of IV fluid and insulin  overnight 9/2 patient has  become acidotic with increasing anion gap. Endo tool reinitiated 9/3--> will continue.  Improvement in anion gap noted along with BHB. Will continue tonight. -A1c 10.1.     Dysphagia - Speech and swallow evaluation once his mentation has better.  Currently he is n.p.o.  Sepsis, multifocal pneumonia -Concerns of multifocal pneumonia seen on CT chest - Completed 3 days of azithromycin , will complete 7 days of Rocephin  - COVID/flu/RSV negative - Bronchodilators, I-S/flutter valve.  Supportive care   Recent ground-level fall complicated by type II dens fracture Repeat CT C-spine demonstrates slightly more displaced type II dens with 2 mm distraction. Clinically neurologically intact in all extremities Seen by neurosurgery, Dr. [name].  Hard c-collar adjusted to correct fitting.  Outpatient follow-up.   Encephalopathy, metabolic suspected setting of underlying infection Dementia CT head with no acute intracranial abnormality.  Likely in the setting of infection and acidosis.  B12 normal - TSH and ammonia are normal   Atrial fibrillation with RVR - Likely from undergoing stress briefly requiring amiodarone  drip earlier in the hospitalization. Again rapid A-fib 9/3 night, converted with IV metoprolol  Will add Cardizem  30 mg every 6 hours p.o. continue Coreg  6.25 bid  discussed with daughter, long-term anticoagulation deferred due to risk of fall   Elevated troponin Coronary artery disease -Initially significantly elevated troponins CT showing evidence of 4 vessel disease.  Currently suspect demand ischemia.  Echocardiogram shows preserved EF, grade 3 aortic root plaque.  Completed 48 hours of heparin  drip.  Aspirin  has been changed to Plavix .   Acute kidney injury resolved Baseline creatinine 0.7 - 1, elevated to  1.5 on admission.  Continue IV fluids per DKA protocol   Isolated elevation in T Bili  -Trend LFT   Incidental findings: Aortic atherosclerosis, 4 vessel CAD, aortic valve  calcification Cholelithiasis, diverticulosis, small hiatal hernia, prostamegaly   CHF with EF 55% Resume Coreg .  Hold Entresto .   Hypertension Slowly resuming home medications Hyperlipidemia See CAD   OSA  Not on CPAP   Mood disorder  Continue home sertraline    Recurrent falls PT/OT-SNF   DVT prophylaxis: Lovenox .     Code Status: Full Code  Discussed with patient daughter over the phone.  Continue inpatient   Consultants:   Procedures:   Antimicrobials:    Subjective:  Patient seen and examined at the bedside.  He looks better than yesterday.  He is more alert and conversant than yesterday.  Able to answer basic questions.  Vital signs have been stable.  He however flipped into A-fib RVR overnight, improved with a dose of IV metoprolol . Anion gap resolved, BHB improved to 1.38.  However due to recurrence of acidosis 3 days ago, will continue IV insulin  through the a.m. Objective: Vitals:   02/26/24 0006 02/26/24 0425 02/26/24 0727 02/26/24 1150  BP: 113/64 118/77 (!) 140/61 136/69  Pulse: 98 96 (!) 103   Resp: 18 19 20    Temp: 98.5 F (36.9 C) 99.2 F (37.3 C) 97.7 F (36.5 C) 97.7 F (36.5 C)  TempSrc: Oral Oral Oral Oral  SpO2: 97% 97% 98%   Weight:  70.6 kg    Height:        Intake/Output Summary (Last 24 hours) at 02/26/2024 1531 Last data filed at 02/26/2024 9192 Gross per 24 hour  Intake 2358.85 ml  Output 1690 ml  Net 668.85 ml   Filed Weights   02/24/24 0457 02/25/24 0015 02/26/24 0425  Weight: 72.2 kg 70.9 kg 70.6 kg    Examination:  General exam: Appears calm and comfortable, conversant Respiratory system: Bilateral decreased breath sounds at bases Cardiovascular system: S1 & S2 heard, Rate controlled Gastrointestinal system: Abdomen is nondistended, soft and nontender. Normal bowel sounds heard. Extremities: No cyanosis, clubbing, edema  Central nervous system: Alert and oriented. No focal neurological deficits. Moving  extremities Skin: No rashes, lesions or ulcers  Data Reviewed: I have personally reviewed following labs and imaging studies  CBC: Recent Labs  Lab 02/19/24 2010 02/20/24 0322 02/22/24 0943 02/23/24 0256 02/24/24 0440 02/25/24 0834 02/26/24 0428  WBC 16.6*   < > 9.1 10.0 9.8 11.7* 8.0  NEUTROABS 14.3*  --   --   --   --   --   --   HGB 14.2   < > 12.2* 11.4* 10.1* 11.2* 10.3*  HCT 44.3   < > 37.0* 33.8* 30.4* 34.3* 30.4*  MCV 93.7   < > 89.2 87.8 89.1 89.6 87.4  PLT 213   < > 143* 144* 136* 166 160   < > = values in this interval not displayed.   Basic Metabolic Panel: Recent Labs  Lab 02/20/24 0600 02/20/24 0858 02/24/24 0617 02/24/24 1328 02/25/24 0834 02/25/24 1804 02/25/24 2241 02/26/24 0428 02/26/24 0930  NA 140   < > 139   < > 141 139 141 141 140  K 4.6   < > 3.2*   < > 3.5 4.1 3.5 3.1* 5.1  CL 109   < > 110   < > 110 113* 114* 114* 114*  CO2 <7*   < > 16*   < > 8* 10*  12* 14* 14*  GLUCOSE 217*   < > 116*   < > 110* 200* 200* 197* 196*  BUN 26*   < > 7*   < > 9 11 11  6* 5*  CREATININE 1.60*   < > 0.93   < > 1.13 1.23 1.17 1.04 0.86  CALCIUM  8.4*   < > 7.6*   < > 7.6* 7.8* 7.8* 7.7* 7.6*  MG 1.9  --  1.7  --  2.0  --   --  1.8  --   PHOS 4.7*  --   --   --   --   --   --   --   --    < > = values in this interval not displayed.   GFR: Estimated Creatinine Clearance: 46.9 mL/min (by C-G formula based on SCr of 0.86 mg/dL). Liver Function Tests: Recent Labs  Lab 02/19/24 2010 02/20/24 1309  AST 17 19  ALT 15 10  ALKPHOS 90 78  BILITOT 1.8* 1.2  PROT 6.9 6.1*  ALBUMIN 3.5 3.0*   No results for input(s): LIPASE, AMYLASE in the last 168 hours. Recent Labs  Lab 02/20/24 0911  AMMONIA 17   Coagulation Profile: Recent Labs  Lab 02/19/24 2010  INR 1.4*   Cardiac Enzymes: No results for input(s): CKTOTAL, CKMB, CKMBINDEX, TROPONINI in the last 168 hours. BNP (last 3 results) No results for input(s): PROBNP in the last 8760  hours. HbA1C: No results for input(s): HGBA1C in the last 72 hours. CBG: Recent Labs  Lab 02/26/24 1030 02/26/24 1131 02/26/24 1232 02/26/24 1333 02/26/24 1435  GLUCAP 176* 163* 182* 187* 196*   Lipid Profile: No results for input(s): CHOL, HDL, LDLCALC, TRIG, CHOLHDL, LDLDIRECT in the last 72 hours. Thyroid  Function Tests: No results for input(s): TSH, T4TOTAL, FREET4, T3FREE, THYROIDAB in the last 72 hours. Anemia Panel: No results for input(s): VITAMINB12, FOLATE, FERRITIN, TIBC, IRON, RETICCTPCT in the last 72 hours. Sepsis Labs: Recent Labs  Lab 02/20/24 0858 02/20/24 1309 02/23/24 0256 02/23/24 1159 02/23/24 1534  PROCALCITON  --  3.72 0.67  --   --   LATICACIDVEN 2.6* 2.3*  --  0.8 0.9    Recent Results (from the past 240 hours)  Blood Culture (routine x 2)     Status: None   Collection Time: 02/19/24  8:09 PM   Specimen: BLOOD RIGHT FOREARM  Result Value Ref Range Status   Specimen Description BLOOD RIGHT FOREARM  Final   Special Requests   Final    BOTTLES DRAWN AEROBIC AND ANAEROBIC Blood Culture adequate volume   Culture   Final    NO GROWTH 5 DAYS Performed at [facility], [address] [place], [place] [address]    Report Status 02/24/2024 FINAL  Final  Blood Culture (routine x 2)     Status: None   Collection Time: 02/19/24  8:51 PM   Specimen: BLOOD  Result Value Ref Range Status   Specimen Description BLOOD RIGHT ANTECUBITAL  Final   Special Requests   Final    BOTTLES DRAWN AEROBIC AND ANAEROBIC Blood Culture adequate volume   Culture   Final    NO GROWTH 5 DAYS Performed at [facility], [address] [place], [place] [address]    Report Status 02/24/2024 FINAL  Final  Resp panel by RT-PCR (RSV, Flu A&B, Covid) Anterior Nasal Swab     Status: None   Collection Time: 02/19/24  8:52 PM   Specimen:  Anterior Nasal Swab  Result Value Ref Range Status   SARS Coronavirus 2 by RT PCR NEGATIVE  NEGATIVE Final   Influenza A by PCR NEGATIVE NEGATIVE Final   Influenza B by PCR NEGATIVE NEGATIVE Final    Comment: (NOTE) The Xpert Xpress SARS-CoV-2/FLU/RSV plus assay is intended as an aid in the diagnosis of influenza from Nasopharyngeal swab specimens and should not be used as a sole basis for treatment. Nasal washings and aspirates are unacceptable for Xpert Xpress SARS-CoV-2/FLU/RSV testing.  Fact Sheet for Patients: BloggerCourse.com  Fact Sheet for Healthcare Providers: SeriousBroker.it  This test is not yet approved or cleared by the [place]  FDA and has been authorized for detection and/or diagnosis of SARS-CoV-2 by FDA under an Emergency Use Authorization (EUA). This EUA will remain in effect (meaning this test can be used) for the duration of the COVID-19 declaration under Section 564(b)(1) of the Act, 21 U.S.C. section 360bbb-3(b)(1), unless the authorization is terminated or revoked.     Resp Syncytial Virus by PCR NEGATIVE NEGATIVE Final    Comment: (NOTE) Fact Sheet for Patients: BloggerCourse.com  Fact Sheet for Healthcare Providers: SeriousBroker.it  This test is not yet approved or cleared by the [place]  FDA and has been authorized for detection and/or diagnosis of SARS-CoV-2 by FDA under an Emergency Use Authorization (EUA). This EUA will remain in effect (meaning this test can be used) for the duration of the COVID-19 declaration under Section 564(b)(1) of the Act, 21 U.S.C. section 360bbb-3(b)(1), unless the authorization is terminated or revoked.  Performed at [facility], [address], [place], [place] [address]   Respiratory (~20 pathogens) panel by PCR     Status: None   Collection Time: 02/20/24  3:40 AM   Specimen: Nasopharyngeal Swab; Respiratory  Result Value Ref Range Status   Adenovirus NOT DETECTED NOT DETECTED Final    Coronavirus 229E NOT DETECTED NOT DETECTED Final    Comment: (NOTE) The Coronavirus on the Respiratory Panel, DOES NOT test for the novel  Coronavirus (2019 nCoV)    Coronavirus HKU1 NOT DETECTED NOT DETECTED Final   Coronavirus NL63 NOT DETECTED NOT DETECTED Final   Coronavirus OC43 NOT DETECTED NOT DETECTED Final   Metapneumovirus NOT DETECTED NOT DETECTED Final   Rhinovirus / Enterovirus NOT DETECTED NOT DETECTED Final   Influenza A NOT DETECTED NOT DETECTED Final   Influenza B NOT DETECTED NOT DETECTED Final   Parainfluenza Virus 1 NOT DETECTED NOT DETECTED Final   Parainfluenza Virus 2 NOT DETECTED NOT DETECTED Final   Parainfluenza Virus 3 NOT DETECTED NOT DETECTED Final   Parainfluenza Virus 4 NOT DETECTED NOT DETECTED Final   Respiratory Syncytial Virus NOT DETECTED NOT DETECTED Final   Bordetella pertussis NOT DETECTED NOT DETECTED Final   Bordetella Parapertussis NOT DETECTED NOT DETECTED Final   Chlamydophila pneumoniae NOT DETECTED NOT DETECTED Final   Mycoplasma pneumoniae NOT DETECTED NOT DETECTED Final    Comment: Performed at [facility], [address], [place], [place] [address]  MRSA Next Gen by PCR, Nasal     Status: None   Collection Time: 02/20/24  3:40 AM   Specimen: Nasal Mucosa; Nasal Swab  Result Value Ref Range Status   MRSA by PCR Next Gen NOT DETECTED NOT DETECTED Final    Comment: (NOTE) The GeneXpert MRSA Assay (FDA approved for NASAL specimens only), is one component of a comprehensive MRSA colonization surveillance program. It is not intended to diagnose MRSA infection nor to guide  or monitor treatment for MRSA infections. Test performance is not FDA approved in patients less than 32 years old. Performed at [facility], [address], [place], [place] [address]          Radiology Studies: No results found.      Scheduled Meds:  atorvastatin   40 mg Oral QPM   brimonidine   1 drop Both Eyes BID   carvedilol   6.25 mg  Oral BID WC   Chlorhexidine  Gluconate Cloth  6 each Topical Daily   cholecalciferol   1,000 Units Oral Daily   finasteride   5 mg Oral QPM   heparin  injection (subcutaneous)  5,000 Units Subcutaneous Q8H   sertraline   50 mg Oral QPM   sodium chloride  flush  10-40 mL Intracatheter Q12H   sodium chloride  flush  3 mL Intravenous Q12H   tamsulosin   0.4 mg Oral QHS   timolol   1 drop Both Eyes BID   Continuous Infusions:  cefTRIAXone  (ROCEPHIN )  IV 1 g (02/26/24 1238)   dextrose  5% lactated ringers  with KCl 20 mEq/L 125 mL/hr at 02/26/24 0936   insulin  1 Units/hr (02/26/24 1436)          Derryl Duval, MD [facility] 02/26/2024, 3:31 PM

## 2024-02-26 NOTE — Inpatient Diabetes Management (Signed)
 Inpatient Diabetes Program Recommendations  AACE/ADA: New Consensus Statement on Inpatient Glycemic Control (2015)  Target Ranges:  Prepandial:   less than 140 mg/dL      Peak postprandial:   less than 180 mg/dL (1-2 hours)      Critically ill patients:  140 - 180 mg/dL   Lab Results  Component Value Date   GLUCAP 182 (H) 02/26/2024   HGBA1C 10.1 (H) 02/20/2024    Latest Reference Range & Units 02/26/24 04:28 02/26/24 04:37  CO2 22 - 32 mmol/L 14 (L)   Beta-Hydroxybutyric Acid 0.05 - 0.27 mmol/L  2.68 (H)  (L): Data is abnormally low (H): Data is abnormally high  IV insulin  continues  Might consider a higher concentration of dextrose  continuous to help clear acidosis.    Thank you, Wyvonna Pinal, MSN, CDCES Diabetes Coordinator Inpatient Diabetes Program [phone] (team pager from 8a-5p)

## 2024-02-27 ENCOUNTER — Inpatient Hospital Stay (HOSPITAL_COMMUNITY)

## 2024-02-27 LAB — BASIC METABOLIC PANEL WITH GFR
Anion gap: 7 (ref 5–15)
BUN: <5 mg/dL — ABNORMAL LOW (ref 8–23)
CO2: 20 mmol/L — ABNORMAL LOW (ref 22–32)
Calcium: 7.6 mg/dL — ABNORMAL LOW (ref 8.9–10.3)
Chloride: 112 mmol/L — ABNORMAL HIGH (ref 98–111)
Creatinine, Ser: 0.66 mg/dL (ref 0.61–1.24)
GFR, Estimated: >60 mL/min (ref >60)
Glucose, Bld: 200 mg/dL — ABNORMAL HIGH (ref 70–99)
Potassium: 3.3 mmol/L — ABNORMAL LOW (ref 3.5–5.1)
Sodium: 139 mmol/L (ref 135–145)

## 2024-02-27 LAB — GLUCOSE, CAPILLARY
Glucose-Capillary: 164 mg/dL — ABNORMAL HIGH (ref 70–99)
Glucose-Capillary: 164 mg/dL — ABNORMAL HIGH (ref 70–99)
Glucose-Capillary: 169 mg/dL — ABNORMAL HIGH (ref 70–99)
Glucose-Capillary: 171 mg/dL — ABNORMAL HIGH (ref 70–99)
Glucose-Capillary: 172 mg/dL — ABNORMAL HIGH (ref 70–99)
Glucose-Capillary: 176 mg/dL — ABNORMAL HIGH (ref 70–99)
Glucose-Capillary: 179 mg/dL — ABNORMAL HIGH (ref 70–99)
Glucose-Capillary: 180 mg/dL — ABNORMAL HIGH (ref 70–99)
Glucose-Capillary: 182 mg/dL — ABNORMAL HIGH (ref 70–99)
Glucose-Capillary: 184 mg/dL — ABNORMAL HIGH (ref 70–99)
Glucose-Capillary: 189 mg/dL — ABNORMAL HIGH (ref 70–99)
Glucose-Capillary: 194 mg/dL — ABNORMAL HIGH (ref 70–99)

## 2024-02-27 LAB — CBC
HCT: 27.6 % — ABNORMAL LOW (ref 39.0–52.0)
Hemoglobin: 9.6 g/dL — ABNORMAL LOW (ref 13.0–17.0)
MCH: 29.8 pg (ref 26.0–34.0)
MCHC: 34.8 g/dL (ref 30.0–36.0)
MCV: 85.7 fL (ref 80.0–100.0)
Platelets: 137 K/uL — ABNORMAL LOW (ref 150–400)
RBC: 3.22 MIL/uL — ABNORMAL LOW (ref 4.22–5.81)
RDW: 14.5 % (ref 11.5–15.5)
WBC: 6.2 K/uL (ref 4.0–10.5)
nRBC: 0 % (ref 0.0–0.2)

## 2024-02-27 LAB — MAGNESIUM: Magnesium: 1.6 mg/dL — ABNORMAL LOW (ref 1.7–2.4)

## 2024-02-27 LAB — BETA-HYDROXYBUTYRIC ACID: Beta-Hydroxybutyric Acid: 0.21 mmol/L (ref 0.05–0.27)

## 2024-02-27 MED ORDER — MAGNESIUM SULFATE 2 GM/50ML IV SOLN
2.0000 g | Freq: Once | INTRAVENOUS | Status: AC
  Administered 2024-02-27: 2 g via INTRAVENOUS
  Filled 2024-02-27: qty 50

## 2024-02-27 MED ORDER — INSULIN ASPART 100 UNIT/ML IJ SOLN
0.0000 [IU] | INTRAMUSCULAR | Status: DC
  Administered 2024-02-27 (×2): 2 [IU] via SUBCUTANEOUS

## 2024-02-27 MED ORDER — INSULIN GLARGINE-YFGN 100 UNIT/ML ~~LOC~~ SOLN: 15.0000 [IU] | SUBCUTANEOUS | Status: DC

## 2024-02-27 MED ORDER — NYSTATIN 100000 UNIT/GM EX POWD
Freq: Three times a day (TID) | CUTANEOUS | Status: DC
  Administered 2024-02-27: 1 via TOPICAL
  Filled 2024-02-27: qty 15

## 2024-02-27 MED ORDER — INSULIN GLARGINE 100 UNIT/ML ~~LOC~~ SOLN
15.0000 [IU] | Freq: Every day | SUBCUTANEOUS | Status: DC
  Administered 2024-02-27 – 2024-03-01 (×4): 15 [IU] via SUBCUTANEOUS
  Filled 2024-02-27 (×4): qty 0.15

## 2024-02-27 MED ORDER — POTASSIUM CHLORIDE 10 MEQ/50ML IV SOLN
10.0000 meq | INTRAVENOUS | Status: AC
  Administered 2024-02-27 (×4): 10 meq via INTRAVENOUS
  Filled 2024-02-27 (×4): qty 50

## 2024-02-27 MED ORDER — INSULIN ASPART 100 UNIT/ML IJ SOLN
0.0000 [IU] | Freq: Three times a day (TID) | INTRAMUSCULAR | Status: DC
  Administered 2024-02-27: 2 [IU] via SUBCUTANEOUS
  Administered 2024-02-28: 1 [IU] via SUBCUTANEOUS
  Administered 2024-02-28: 3 [IU] via SUBCUTANEOUS
  Administered 2024-02-28: 1 [IU] via SUBCUTANEOUS
  Administered 2024-02-29: 2 [IU] via SUBCUTANEOUS
  Administered 2024-02-29: 1 [IU] via SUBCUTANEOUS
  Administered 2024-02-29 – 2024-03-01 (×2): 2 [IU] via SUBCUTANEOUS
  Administered 2024-03-01: 1 [IU] via SUBCUTANEOUS

## 2024-02-27 NOTE — Progress Notes (Signed)
 PROGRESS NOTE    Matthew Calderon  FMW:[id number] DOB: 1933-03-17 DOA: 02/19/2024 PCP: Caleen Dirks, MD   Brief Narrative:    Assessment & Plan:   Principal Problem:   CAP (community acquired pneumonia) Active Problems:   Non-ST elevation (NSTEMI) myocardial infarction (HCC)   Sepsis (HCC)   Atrial fibrillation, rapid (HCC)   Acute metabolic encephalopathy   Myocardial injury   Recurrent falls   Atherosclerosis of aorta (HCC)   Coronary artery calcification   Heart failure with improved ejection fraction (HFimpEF) (HCC)   Multifocal pneumonia   Atrial fibrillation with RVR (HCC)   Diabetic ketoacidosis without coma associated with type 2 diabetes mellitus (HCC)   Brief Narrative:    89 year old with history of CAD, CHF, DM2, HTN, HLD, OSA, disorder, recurrent falls comes to the ED from [facility] came to the ED on 8/27 from a ground-level fall found to have type II dens fracture seen by neurosurgery and placed in hard collar with plans to follow-up outpatient in 2 weeks.  But now brought back to the hospital due to concerns of fever and change in mental status.  COVID/RSV/flu were negative.  CT chest abdomen pelvis shows concerns of bilateral patchy opacity, CT head negative for acute pathology, CT cervical spine is also negative.   Patient initially was in euglycemic DKA secondary Trijardy requiring insulin  drip and was transitioned to subcu but again on 9/1 went back into euglycemic DKA requiring Endo tool.  Patient again went into DKA on 9/3 Transitioned off IV insulin  to subcu 9/5    Assessment/plan: Euglycemic diabetic ketoacidosis Uncontrolled DM 2 secondary to hyperglycemia Severe acidosis and euglycemic DKA suspected secondary to Trijardy.  Required insulin  drip which has now been transitioned to subcu sliding scale and long-acting.  Patient went back into euglycemic DKA on 9/1 requiring DKA protocol/Endo tool.  Anion gap improved however with discontinuation of IV  fluid and insulin  overnight 9/2 patient has become acidotic with increasing anion gap. Endo tool reinitiated 9/3--> will continue.  Improvement in anion gap noted along with BHB. IV insulin  discontinued, started on subcu lispro and Lantus . -A1c 10.1.     Dysphagia - ST evaluation ongoing, patient is placed on modified diet.  Sepsis, multifocal pneumonia -Concerns of multifocal pneumonia seen on CT chest - Completed 3 days of azithromycin , will complete 7 days of Rocephin  - COVID/flu/RSV negative - Bronchodilators, I-S/flutter valve.  Supportive care   Recent ground-level fall complicated by type II dens fracture Repeat CT C-spine demonstrates slightly more displaced type II dens with 2 mm distraction. Clinically neurologically intact in all extremities Seen by neurosurgery, Dr. [name].  Hard c-collar adjusted to correct fitting.  Outpatient follow-up.   Encephalopathy, metabolic suspected setting of underlying infection Dementia CT head with no acute intracranial abnormality.  Likely in the setting of infection and acidosis.  B12 normal - TSH and ammonia are normal   Atrial fibrillation with RVR - Likely from undergoing stress briefly requiring amiodarone  drip earlier in the hospitalization. Again rapid A-fib 9/3 night, converted with IV metoprolol  On 9/4, added Cardizem  30 mg every 6 hours p.o. continue Coreg  6.25 bid  discussed with daughter, long-term anticoagulation deferred due to risk of fall   Elevated troponin Coronary artery disease -Initially significantly elevated troponins CT showing evidence of 4 vessel disease.  Currently suspect demand ischemia.  Echocardiogram shows preserved EF, grade 3 aortic root plaque.  Completed 48 hours of heparin  drip.  Aspirin  has been changed to Plavix .   Acute kidney  injury resolved Baseline creatinine 0.7 - 1, elevated to 1.5 on admission.  Continue IV fluids per DKA protocol   Isolated elevation in T Bili  -Trend LFT   Incidental  findings: Aortic atherosclerosis, 4 vessel CAD, aortic valve calcification Cholelithiasis, diverticulosis, small hiatal hernia, prostamegaly   CHF with EF 55% Resume Coreg .  Hold Entresto .   Hypertension Slowly resuming home medications Hyperlipidemia See CAD   OSA  Not on CPAP   Mood disorder  Continue home sertraline    Recurrent falls PT/OT-SNF   DVT prophylaxis: Lovenox .     Code Status: Full Code  Continue inpatient   Consultants:   Procedures:   Antimicrobials:    Subjective:  Patient seen and examined at the bedside.  He continues to feel better.  He is alert and oriented.  Eager to eat something.  Beta-hydroxybutyrate has normalized.  Vital signs are stable. Transition to subcu insulin  today  Objective: Vitals:   02/27/24 0341 02/27/24 0741 02/27/24 1100 02/27/24 1523  BP: (!) 119/50 (!) 126/59  (!) 98/45  Pulse: 70 81  80  Resp: 20 20  18   Temp: 97.9 F (36.6 C) 98.5 F (36.9 C) 97.6 F (36.4 C) 98.4 F (36.9 C)  TempSrc: Oral Oral Oral Oral  SpO2:  95%  93%  Weight: 69.5 kg     Height:        Intake/Output Summary (Last 24 hours) at 02/27/2024 1533 Last data filed at 02/27/2024 1458 Gross per 24 hour  Intake 4061.44 ml  Output 2550 ml  Net 1511.44 ml   Filed Weights   02/25/24 0015 02/26/24 0425 02/27/24 0341  Weight: 70.9 kg 70.6 kg 69.5 kg    Examination:  General exam: Appears calm and comfortable, conversant Respiratory system: Bilateral decreased breath sounds at bases Cardiovascular system: S1 & S2 heard, Rate controlled Gastrointestinal system: Abdomen is nondistended, soft and nontender. Normal bowel sounds heard. Extremities: No cyanosis, clubbing, edema  Central nervous system: Alert and oriented. No focal neurological deficits. Moving extremities Skin: No rashes, lesions or ulcers  Data Reviewed: I have personally reviewed following labs and imaging studies  CBC: Recent Labs  Lab 02/23/24 0256 02/24/24 0440  02/25/24 0834 02/26/24 0428 02/27/24 0403  WBC 10.0 9.8 11.7* 8.0 6.2  HGB 11.4* 10.1* 11.2* 10.3* 9.6*  HCT 33.8* 30.4* 34.3* 30.4* 27.6*  MCV 87.8 89.1 89.6 87.4 85.7  PLT 144* 136* 166 160 137*   Basic Metabolic Panel: Recent Labs  Lab 02/24/24 0617 02/24/24 1328 02/25/24 0834 02/25/24 1804 02/25/24 2241 02/26/24 0428 02/26/24 0930 02/26/24 1606 02/27/24 0100 02/27/24 0403  NA 139   < > 141   < > 141 141 140 139 139  --   K 3.2*   < > 3.5   < > 3.5 3.1* 5.1 3.6 3.3*  --   CL 110   < > 110   < > 114* 114* 114* 111 112*  --   CO2 16*   < > 8*   < > 12* 14* 14* 17* 20*  --   GLUCOSE 116*   < > 110*   < > 200* 197* 196* 200* 200*  --   BUN 7*   < > 9   < > 11 6* 5* <5* <5*  --   CREATININE 0.93   < > 1.13   < > 1.17 1.04 0.86 0.77 0.66  --   CALCIUM  7.6*   < > 7.6*   < > 7.8* 7.7* 7.6* 7.7*  7.6*  --   MG 1.7  --  2.0  --   --  1.8  --   --   --  1.6*   < > = values in this interval not displayed.   GFR: Estimated Creatinine Clearance: 49.9 mL/min (by C-G formula based on SCr of 0.66 mg/dL). Liver Function Tests: No results for input(s): AST, ALT, ALKPHOS, BILITOT, PROT, ALBUMIN in the last 168 hours.  No results for input(s): LIPASE, AMYLASE in the last 168 hours. No results for input(s): AMMONIA in the last 168 hours.  Coagulation Profile: No results for input(s): INR, PROTIME in the last 168 hours.  Cardiac Enzymes: No results for input(s): CKTOTAL, CKMB, CKMBINDEX, TROPONINI in the last 168 hours. BNP (last 3 results) No results for input(s): PROBNP in the last 8760 hours. HbA1C: No results for input(s): HGBA1C in the last 72 hours. CBG: Recent Labs  Lab 02/27/24 0531 02/27/24 0623 02/27/24 0734 02/27/24 1012 02/27/24 1201  GLUCAP 189* 180* 182* 184* 194*   Lipid Profile: No results for input(s): CHOL, HDL, LDLCALC, TRIG, CHOLHDL, LDLDIRECT in the last 72 hours. Thyroid  Function Tests: No results for  input(s): TSH, T4TOTAL, FREET4, T3FREE, THYROIDAB in the last 72 hours. Anemia Panel: No results for input(s): VITAMINB12, FOLATE, FERRITIN, TIBC, IRON, RETICCTPCT in the last 72 hours. Sepsis Labs: Recent Labs  Lab 02/23/24 0256 02/23/24 1159 02/23/24 1534  PROCALCITON 0.67  --   --   LATICACIDVEN  --  0.8 0.9    Recent Results (from the past 240 hours)  Blood Culture (routine x 2)     Status: None   Collection Time: 02/19/24  8:09 PM   Specimen: BLOOD RIGHT FOREARM  Result Value Ref Range Status   Specimen Description BLOOD RIGHT FOREARM  Final   Special Requests   Final    BOTTLES DRAWN AEROBIC AND ANAEROBIC Blood Culture adequate volume   Culture   Final    NO GROWTH 5 DAYS Performed at [facility], [address], [place], [place] [address]    Report Status 02/24/2024 FINAL  Final  Blood Culture (routine x 2)     Status: None   Collection Time: 02/19/24  8:51 PM   Specimen: BLOOD  Result Value Ref Range Status   Specimen Description BLOOD RIGHT ANTECUBITAL  Final   Special Requests   Final    BOTTLES DRAWN AEROBIC AND ANAEROBIC Blood Culture adequate volume   Culture   Final    NO GROWTH 5 DAYS Performed at [facility], [address] [place], [place] [address]    Report Status 02/24/2024 FINAL  Final  Resp panel by RT-PCR (RSV, Flu A&B, Covid) Anterior Nasal Swab     Status: None   Collection Time: 02/19/24  8:52 PM   Specimen: Anterior Nasal Swab  Result Value Ref Range Status   SARS Coronavirus 2 by RT PCR NEGATIVE NEGATIVE Final   Influenza A by PCR NEGATIVE NEGATIVE Final   Influenza B by PCR NEGATIVE NEGATIVE Final    Comment: (NOTE) The Xpert Xpress SARS-CoV-2/FLU/RSV plus assay is intended as an aid in the diagnosis of influenza from Nasopharyngeal swab specimens and should not be used as a sole basis for treatment. Nasal washings and aspirates are unacceptable for Xpert Xpress SARS-CoV-2/FLU/RSV testing.  Fact  Sheet for Patients: BloggerCourse.com  Fact Sheet for Healthcare Providers: SeriousBroker.it  This test is not yet approved or cleared by the [place]  FDA and has been authorized for  detection and/or diagnosis of SARS-CoV-2 by FDA under an Emergency Use Authorization (EUA). This EUA will remain in effect (meaning this test can be used) for the duration of the COVID-19 declaration under Section 564(b)(1) of the Act, 21 U.S.C. section 360bbb-3(b)(1), unless the authorization is terminated or revoked.     Resp Syncytial Virus by PCR NEGATIVE NEGATIVE Final    Comment: (NOTE) Fact Sheet for Patients: BloggerCourse.com  Fact Sheet for Healthcare Providers: SeriousBroker.it  This test is not yet approved or cleared by the [place]  FDA and has been authorized for detection and/or diagnosis of SARS-CoV-2 by FDA under an Emergency Use Authorization (EUA). This EUA will remain in effect (meaning this test can be used) for the duration of the COVID-19 declaration under Section 564(b)(1) of the Act, 21 U.S.C. section 360bbb-3(b)(1), unless the authorization is terminated or revoked.  Performed at [facility], [address], [place], [place] [address]   Respiratory (~20 pathogens) panel by PCR     Status: None   Collection Time: 02/20/24  3:40 AM   Specimen: Nasopharyngeal Swab; Respiratory  Result Value Ref Range Status   Adenovirus NOT DETECTED NOT DETECTED Final   Coronavirus 229E NOT DETECTED NOT DETECTED Final    Comment: (NOTE) The Coronavirus on the Respiratory Panel, DOES NOT test for the novel  Coronavirus (2019 nCoV)    Coronavirus HKU1 NOT DETECTED NOT DETECTED Final   Coronavirus NL63 NOT DETECTED NOT DETECTED Final   Coronavirus OC43 NOT DETECTED NOT DETECTED Final   Metapneumovirus NOT DETECTED NOT DETECTED Final   Rhinovirus / Enterovirus NOT DETECTED  NOT DETECTED Final   Influenza A NOT DETECTED NOT DETECTED Final   Influenza B NOT DETECTED NOT DETECTED Final   Parainfluenza Virus 1 NOT DETECTED NOT DETECTED Final   Parainfluenza Virus 2 NOT DETECTED NOT DETECTED Final   Parainfluenza Virus 3 NOT DETECTED NOT DETECTED Final   Parainfluenza Virus 4 NOT DETECTED NOT DETECTED Final   Respiratory Syncytial Virus NOT DETECTED NOT DETECTED Final   Bordetella pertussis NOT DETECTED NOT DETECTED Final   Bordetella Parapertussis NOT DETECTED NOT DETECTED Final   Chlamydophila pneumoniae NOT DETECTED NOT DETECTED Final   Mycoplasma pneumoniae NOT DETECTED NOT DETECTED Final    Comment: Performed at [facility], [address], [place], [place] [address]  MRSA Next Gen by PCR, Nasal     Status: None   Collection Time: 02/20/24  3:40 AM   Specimen: Nasal Mucosa; Nasal Swab  Result Value Ref Range Status   MRSA by PCR Next Gen NOT DETECTED NOT DETECTED Final    Comment: (NOTE) The GeneXpert MRSA Assay (FDA approved for NASAL specimens only), is one component of a comprehensive MRSA colonization surveillance program. It is not intended to diagnose MRSA infection nor to guide or monitor treatment for MRSA infections. Test performance is not FDA approved in patients less than 75 years old. Performed at [facility], [address], [place], [place] [address]          Radiology Studies: DG Swallowing Func-Speech Pathology Result Date: 02/27/2024 Table formatting from the original result was not included. Modified Barium Swallow Study Patient Details Name: Matthew Calderon MRN: [id number] Date of Birth: 1932-07-30 Today's Date: 02/27/2024 HPI/PMH: HPI: Ariana Cavenaugh is an 89 year old male who presented to the ED from [facility] came to the ED on 8/27 from a ground-level fall found to have type II dens fracture seen by neurosurgery and placed in hard  collar with plans to follow-up outpatient in 2 weeks.  But now brought back to the  hospital 8/28 due to concerns of fever and change in mental status. CT chest 8/28: Bilateral lower lobe patchy airspace opacities. Finding may represent atelectasis with superimposed infection.  COVID/RSV/influenza negative. Pt reporting nonproductive cough and dysphagia in ED.  Pt with history of CAD, CHF, DM2, HTN, HLD, OSA, disorder, recurrent falls. BSE completed on 8/29 and was placed on regular thin with recommendations for a MBS. Per radiology staff, RN was not able to come with pt at the time of his first schedule MBS. On 9/1, Diet NPO due to Diabetes Ketoacidosis (DKA). Mentation improved and able to complete MBS 9/5 with recs for Reg/Mildly thick liquids. Clinical Impression: Pt presents with a moderate oropharyngeal dysphagia c/b lingual discoordination, delayed swallow intiation, reduced base of tongue retraction, decreased hyolaryngeal excursion, incomplete laryngeal closure and diminished sensation.  These deficits resulted in silent aspiration of thin liquid intermittently during the swallow.  There was no penetration or aspiration of any other consistencies trialed.  Suspect presence of hard cervical collar is impacting swallow function holding pt's head/neck in position during swallow. Pill simulation was completed with nectar thick liquid with no penetration or aspiration observed, but there was retention and backflow of contrast within the esophagus on sweep.  Recommend regular texture diet with nectar thick liquids.  Pt may take medications whole with nectar thick liquids. Pt may have ice chips and sips of unthickened water in between meals, in moderation, after good oral care, when fully awake/alert, with upright positioning and direct supervision. DIGEST Swallow Severity Rating*  Safety: 2  Efficiency: 0  Overall Pharyngeal Swallow Severity: 2 1: mild; 2: moderate; 3: severe; 4: profound *The Dynamic Imaging Grade of Swallowing Toxicity is standardized for the head and neck cancer population,  however, demonstrates promising clinical applications across populations to standardize the clinical rating of pharyngeal swallow safety and severity. Factors that may increase risk of adverse event in presence of aspiration Noe & Lianne 2021): Factors that may increase risk of adverse event in presence of aspiration Noe & Lianne 2021): Reduced cognitive function; Limited mobility (2/2 cervical collar) Recommendations/Plan: Swallowing Evaluation Recommendations Swallowing Evaluation Recommendations Recommendations: PO diet PO Diet Recommendation: Regular; Mildly thick liquids (Level 2, nectar thick) Liquid Administration via: Cup; Straw Medication Administration: Whole meds with liquid Supervision: Staff to assist with self-feeding Swallowing strategies  : Slow rate; Small bites/sips Postural changes: Position pt fully upright for meals; Stay upright 30-60 min after meals Oral care recommendations: Oral care BID (2x/day) Treatment Plan Treatment Plan Treatment recommendations: Therapy as outlined in treatment plan below Follow-up recommendations: -- (ST at next level of care. Recommend repeat MBSS once cervical collar is discontinued) Functional status assessment: Patient has had a recent decline in their functional status and demonstrates the ability to make significant improvements in function in a reasonable and predictable amount of time. Treatment frequency: Min 2x/week Treatment duration: 2 weeks Interventions: Aspiration precaution training; Diet toleration management by SLP; Compensatory techniques; Patient/family education Recommendations Recommendations for follow up therapy are one component of a multi-disciplinary discharge planning process, led by the attending physician.  Recommendations may be updated based on patient status, additional functional criteria and insurance authorization. Assessment: Orofacial Exam: Orofacial Exam Oral Cavity: Oral Hygiene: WFL Oral Cavity - Dentition: Adequate  natural dentition Orofacial Anatomy: WFL Oral Motor/Sensory Function: -- (See BSE) Anatomy: Anatomy: -- (Presence of cervical collar) Boluses Administered: Boluses Administered Boluses Administered: Thin liquids (Level  0); Mildly thick liquids (Level 2, nectar thick); Moderately thick liquids (Level 3, honey thick); Puree; Solid  Oral Impairment Domain: Oral Impairment Domain Lip Closure: No labial escape Tongue control during bolus hold: Posterior escape of less than half of bolus Bolus preparation/mastication: Disorganized chewing/mashing with solid pieces of bolus unchewed Bolus transport/lingual motion: Repetitive/disorganized tongue motion Oral residue: Trace residue lining oral structures Location of oral residue : Tongue Initiation of pharyngeal swallow : Pyriform sinuses  Pharyngeal Impairment Domain: Pharyngeal Impairment Domain Soft palate elevation: No bolus between soft palate (SP)/pharyngeal wall (PW) Laryngeal elevation: Partial superior movement of thyroid  cartilage/partial approximation of arytenoids to epiglottic petiole Anterior hyoid excursion: Partial anterior movement Epiglottic movement: Complete inversion Laryngeal vestibule closure: Incomplete, narrow column air/contrast in laryngeal vestibule Pharyngeal stripping wave : Present - diminished Pharyngeal contraction (A/P view only): N/A Pharyngoesophageal segment opening: Complete distension and complete duration, no obstruction of flow Tongue base retraction: Trace column of contrast or air between tongue base and PPW Pharyngeal residue: Trace residue within or on pharyngeal structures Location of pharyngeal residue: Valleculae; Pyriform sinuses  Esophageal Impairment Domain: Esophageal Impairment Domain Esophageal clearance upright position: Esophageal retention with retrograde flow below pharyngoesophageal segment (PES) Pill: Pill Consistency administered: Mildly thick liquids (Level 2, nectar thick) Mildly thick liquids (Level 2, nectar  thick): WFL Penetration/Aspiration Scale Score: Penetration/Aspiration Scale Score 1.  Material does not enter airway: Mildly thick liquids (Level 2, nectar thick); Moderately thick liquids (Level 3, honey thick); Puree; Solid; Pill 8.  Material enters airway, passes BELOW cords without attempt by patient to eject out (silent aspiration) : Thin liquids (Level 0) Compensatory Strategies: Compensatory Strategies Compensatory strategies: Yes Straw: Ineffective Ineffective Straw: Thin liquid (Level 0) Chin tuck: -- (Unable 2/2 presence of hard cervical collar) Other(comment): Ineffective (Cup sips) Ineffective Other(comment): Thin liquid (Level 0)   General Information: Caregiver present: No  Diet Prior to this Study: NPO   No data recorded  No data recorded  Supplemental O2: Nasal cannula   History of Recent Intubation: No  Behavior/Cognition: Alert; Cooperative; Pleasant mood Self-Feeding Abilities: Able to self-feed; Needs assist with self-feeding Baseline vocal quality/speech: Normal Volitional Cough: Able to elicit No data recorded Exam Limitations: No limitations Goal Planning: Prognosis for improved oropharyngeal function: Good No data recorded No data recorded No data recorded Consulted and agree with results and recommendations: Patient; Nurse; Physician Pain: Pain Assessment Faces Pain Scale: 0 Breathing: 0 Negative Vocalization: 0 Facial Expression: 0 Body Language: 0 Consolability: 0 PAINAD Score: 0 End of Session: Start Time:SLP Start Time (ACUTE ONLY): 1027 Stop Time: SLP Stop Time (ACUTE ONLY): 1044 Time Calculation:SLP Time Calculation (min) (ACUTE ONLY): 17 min Charges: SLP Evaluations $ SLP Speech Visit: 1 Visit SLP Evaluations $MBS Swallow: 1 Procedure $Swallowing Treatment: 1 Procedure SLP visit diagnosis: SLP Visit Diagnosis: Dysphagia, oropharyngeal phase (R13.12) Past Medical History: Past Medical History: Diagnosis Date  Anemia   takes iron pill  Anxiety   Arthritis   Depression   Diabetes  mellitus without complication (HCC)   GERD (gastroesophageal reflux disease)   pepto  H/O hiatal hernia   Pneumonia   4 years ago  PONV (postoperative nausea and vomiting) 05/14/2019  Post-nasal drip   Prostate hyperplasia, benign localized, without urinary obstruction   Seasonal allergies   Sleep apnea   no CPAP Past Surgical History: Past Surgical History: Procedure Laterality Date  BACK SURGERY  1995  discectomy  COLONOSCOPY W/ BIOPSIES AND POLYPECTOMY    EYE SURGERY Bilateral   cataracts  EYE  SURGERY Bilateral   glaucoma shunts  JOINT REPLACEMENT Left 1992  knee  JOINT REPLACEMENT Right 1996  knee  JOINT REPLACEMENT Right 2009  hip  JOINT REPLACEMENT Left 2005  hip  KNEE ARTHROSCOPY Left 1981  KNEE ARTHROSCOPY WITH PATELLA RECONSTRUCTION Right 2010  KNEE SURGERY Bilateral 1954  knee surgery and placed in a cast for 6 weeks  KNEE SURGERY  1961  cartilage removed  LEG SURGERY Bilateral 1974, 1975  straighten legs  SHOULDER OPEN ROTATOR CUFF REPAIR Right 2010  TONSILLECTOMY    TOTAL HIP ARTHROPLASTY Right 01/13/2014  Procedure: TOTAL HIP ARTHROPLASTY ANTERIOR APPROACH;  Surgeon: Maude KANDICE Herald, MD;  Location: [facility] OR;  Service: Orthopedics;  Laterality: Right; Anette FORBES Grippe, MA, CCC-SLP Acute Rehabilitation Services Office: [phone] 02/27/2024, 11:24 AM       Scheduled Meds:  atorvastatin   40 mg Oral QPM   brimonidine   1 drop Both Eyes BID   carvedilol   6.25 mg Oral BID WC   Chlorhexidine  Gluconate Cloth  6 each Topical Daily   cholecalciferol   1,000 Units Oral Daily   diltiazem   30 mg Oral Q6H   finasteride   5 mg Oral QPM   heparin  injection (subcutaneous)  5,000 Units Subcutaneous Q8H   insulin  aspart  0-9 Units Subcutaneous TID WC   insulin  glargine  15 Units Subcutaneous Daily   nystatin    Topical TID   sertraline   50 mg Oral QPM   sodium chloride  flush  10-40 mL Intracatheter Q12H   sodium chloride  flush  3 mL Intravenous Q12H   tamsulosin   0.4 mg Oral QHS   timolol   1 drop Both Eyes  BID   Continuous Infusions:          Rhegan Trunnell, MD [facility] 02/27/2024, 3:33 PM

## 2024-02-27 NOTE — Plan of Care (Signed)
  Problem: Nutritional: Goal: Maintenance of adequate nutrition will improve Outcome: Not Progressing   Problem: Education: Goal: Knowledge of General Education information will improve Description: Including pain rating scale, medication(s)/side effects and non-pharmacologic comfort measures Outcome: Progressing   Problem: Clinical Measurements: Goal: Cardiovascular complication will be avoided Outcome: Progressing   Problem: Elimination: Goal: Will not experience complications related to urinary retention Outcome: Progressing     Problem: Metabolic: Goal: Ability to maintain appropriate glucose levels will improve Outcome: Progressing

## 2024-02-27 NOTE — Progress Notes (Signed)
 Notified Dr Alfornia of K 3.3 and of severe redness/rash/yeast like appearance in groin and inner thigh area.

## 2024-02-27 NOTE — Plan of Care (Signed)
  Problem: Education: Goal: Ability to describe self-care measures that may prevent or decrease complications (Diabetes Survival Skills Education) will improve Outcome: Progressing   Problem: Fluid Volume: Goal: Ability to maintain a balanced intake and output will improve Outcome: Progressing   Problem: Metabolic: Goal: Ability to maintain appropriate glucose levels will improve Outcome: Progressing   Problem: Nutritional: Goal: Maintenance of adequate nutrition will improve Outcome: Progressing   Problem: Skin Integrity: Goal: Risk for impaired skin integrity will decrease Outcome: Progressing   Problem: Clinical Measurements: Goal: Will remain free from infection Outcome: Progressing

## 2024-02-27 NOTE — Progress Notes (Signed)
 Physical Therapy Treatment Patient Details Name: Matthew Calderon MRN: [id number] DOB: 1933/03/04 Today's Date: 02/27/2024   History of Present Illness 89 y.o. male adm 02/19/2024 with fever, sepsis, encephalopathy, PNA. 8/30 new AFib with RVR. 9/1 & 9/3 DKA. PMhx: 8/27 fall with type II dens fx seen in ED and D/C. CAD, CHF, DMII, HTN, HLD, OSA, mood disorder.    PT Comments  Pt very pleasant and willing to attempt mobility let's do it to it and progressive gait. Pt unaware of why he has a cervical collar but aware of PNA. Pt educated for cervical restrictions, collar wear and transfers. Pt with slight stool incontinence on arrival, declined BSC then urgent stool with incontinence end of session with assist for pericare. Pt educated for increased mobility and will continue to progress to decrease burden of care.     If plan is discharge home, recommend the following: A lot of help with bathing/dressing/bathroom;Assistance with cooking/housework;Assist for transportation;Help with stairs or ramp for entrance;Direct supervision/assist for medications management;Direct supervision/assist for financial management;Supervision due to cognitive status;A lot of help with walking and/or transfers   Can travel by private vehicle     Yes  Equipment Recommendations  BSC/3in1;Wheelchair (measurements PT);Wheelchair cushion (measurements PT)    Recommendations for Other Services       Precautions / Restrictions Precautions Precautions: Fall;Cervical Precaution Booklet Issued: No Recall of Precautions/Restrictions: Impaired Required Braces or Orthoses: Cervical Brace Cervical Brace: Hard collar;At all times     Mobility  Bed Mobility Overal bed mobility: Needs Assistance Bed Mobility: Rolling, Sidelying to Sit Rolling: Min assist Sidelying to sit: Min assist       General bed mobility comments: multimodal cues to sequence with assist to roll and rise from side to sitting, min assist to scoot  fully to EOB    Transfers Overall transfer level: Needs assistance   Transfers: Sit to/from Stand, Bed to chair/wheelchair/BSC Sit to Stand: Min assist Stand pivot transfers: Min assist         General transfer comment: min assist to power up to standing with cues for hand placement from bed, recliner and BSC. stand pivot recliner<>BSC with RW with assist for balance, lines and direction    Ambulation/Gait Ambulation/Gait assistance: Min assist Gait Distance (Feet): 80 Feet Assistive device: Rolling walker (2 wheels) Gait Pattern/deviations: Step-through pattern, Shuffle, Trunk flexed, Decreased stride length   Gait velocity interpretation: <1.8 ft/sec, indicate of risk for recurrent falls   General Gait Details: pt with flexed posture which increased with progressive gait to forearms on grip of RW with pt needing min assist to direct RW and cues for direction, limited by fatigue   Stairs             Wheelchair Mobility     Tilt Bed    Modified Rankin (Stroke Patients Only)       Balance Overall balance assessment: Needs assistance Sitting-balance support: Feet supported, No upper extremity supported Sitting balance-Leahy Scale: Fair     Standing balance support: Bilateral upper extremity supported, Reliant on assistive device for balance, During functional activity Standing balance-Leahy Scale: Poor Standing balance comment: RW in standing                            Communication Communication Communication: Impaired Factors Affecting Communication: Hearing impaired  Cognition Arousal: Alert Behavior During Therapy: WFL for tasks assessed/performed   PT - Cognitive impairments: No family/caregiver present to determine baseline, Orientation, Awareness,  Memory, Attention, Problem solving, Safety/Judgement, History of cognitive impairments   Orientation impairments: Time                   PT - Cognition Comments: pt not oriented to  year but answered all other orientation questions correctly. urgent need for BM with limited awareness Following commands: Impaired Following commands impaired: Follows one step commands with increased time    Cueing Cueing Techniques: Verbal cues  Exercises      General Comments        Pertinent Vitals/Pain Pain Assessment Pain Assessment: Faces Faces Pain Scale: Hurts little more Pain Location: left hand/wrist Pain Descriptors / Indicators: Sore Pain Intervention(s): Limited activity within patient's tolerance, Monitored during session, Repositioned    Home Living                          Prior Function            PT Goals (current goals can now be found in the care plan section) Progress towards PT goals: Progressing toward goals    Frequency    Min 2X/week      PT Plan      Co-evaluation              AM-PAC PT 6 Clicks Mobility   Outcome Measure  Help needed turning from your back to your side while in a flat bed without using bedrails?: A Little Help needed moving from lying on your back to sitting on the side of a flat bed without using bedrails?: A Little Help needed moving to and from a bed to a chair (including a wheelchair)?: A Little Help needed standing up from a chair using your arms (e.g., wheelchair or bedside chair)?: A Little Help needed to walk in hospital room?: A Little Help needed climbing 3-5 steps with a railing? : Total 6 Click Score: 16    End of Session Equipment Utilized During Treatment: Gait belt;Cervical collar Activity Tolerance: Patient tolerated treatment well Patient left: in chair;with call bell/phone within reach;with chair alarm set;with nursing/sitter in room Nurse Communication: Mobility status PT Visit Diagnosis: Other abnormalities of gait and mobility (R26.89);Muscle weakness (generalized) (M62.81);History of falling (Z91.81)     Time: 1140-1210 PT Time Calculation (min) (ACUTE ONLY): 30  min  Charges:    $Gait Training: 8-22 mins $Therapeutic Activity: 8-22 mins PT General Charges $$ ACUTE PT VISIT: 1 Visit                     Matthew Calderon, PT Acute Rehabilitation Services Office: [phone]    Chipper Koudelka B Lulla [place] 02/27/2024, 1:30 PM

## 2024-02-27 NOTE — Procedures (Signed)
 Modified Barium Swallow Study  Patient Details  Name: Matthew Calderon MRN: [id number] Date of Birth: Jan 05, 1933  Today's Date: 02/27/2024  Modified Barium Swallow completed.  Full report located under Chart Review in the Imaging Section.  History of Present Illness Matthew Calderon is an 89 year old male who presented to the ED from [facility] came to the ED on 8/27 from a ground-level fall found to have type II dens fracture seen by neurosurgery and placed in hard collar with plans to follow-up outpatient in 2 weeks.  But now brought back to the hospital 8/28 due to concerns of fever and change in mental status. CT chest 8/28: Bilateral lower lobe patchy airspace opacities. Finding may represent atelectasis with superimposed infection.  COVID/RSV/influenza negative. Pt reporting nonproductive cough and dysphagia in ED.  Pt with history of CAD, CHF, DM2, HTN, HLD, OSA, disorder, recurrent falls. BSE completed on 8/29 and was placed on regular thin with recommendations for a MBS. Per radiology staff, RN was not able to come with pt at the time of his first schedule MBS. On 9/1, Diet NPO due to Diabetes Ketoacidosis (DKA). Mentation improved and able to complete MBS 9/5 with recs for Reg/Mildly thick liquids.   Clinical Impression Pt presents with a moderate oropharyngeal dysphagia c/b lingual discoordination, delayed swallow intiation, reduced base of tongue retraction, decreased hyolaryngeal excursion, incomplete laryngeal closure and diminished sensation.  These deficits resulted in silent aspiration of thin liquid intermittently during the swallow.  There was no penetration or aspiration of any other consistencies trialed.  Suspect presence of hard cervical collar is impacting swallow function holding pt's head/neck in position during swallow. Pill simulation was completed with nectar thick liquid with no penetration or aspiration observed, but there was retention and backflow of contrast within the  esophagus on sweep.    Recommend regular texture diet with nectar thick liquids.  Pt may take medications whole with nectar thick liquids. Pt may have ice chips and sips of unthickened water in between meals, in moderation, after good oral care, when fully awake/alert, with upright positioning and direct supervision.  DIGEST Swallow Severity Rating*  Safety: 2  Efficiency: 0  Overall Pharyngeal Swallow Severity: 2 1: mild; 2: moderate; 3: severe; 4: profound  *The Dynamic Imaging Grade of Swallowing Toxicity is standardized for the head and neck cancer population, however, demonstrates promising clinical applications across populations to standardize the clinical rating of pharyngeal swallow safety and severity.    Factors that may increase risk of adverse event in presence of aspiration Noe & Lianne 2021): Reduced cognitive function;Limited mobility (2/2 cervical collar)  Swallow Evaluation Recommendations Recommendations: PO diet PO Diet Recommendation: Regular;Mildly thick liquids (Level 2, nectar thick) Liquid Administration via: Cup;Straw Medication Administration: Whole meds with liquid Supervision: Staff to assist with self-feeding Swallowing strategies  : Slow rate;Small bites/sips Postural changes: Position pt fully upright for meals;Stay upright 30-60 min after meals Oral care recommendations: Oral care BID (2x/day)      Anette FORBES Grippe, MA, CCC-SLP Acute Rehabilitation Services Office: [phone] 02/27/2024,11:22 AM

## 2024-02-28 LAB — GLUCOSE, CAPILLARY
Glucose-Capillary: 139 mg/dL — ABNORMAL HIGH (ref 70–99)
Glucose-Capillary: 139 mg/dL — ABNORMAL HIGH (ref 70–99)
Glucose-Capillary: 155 mg/dL — ABNORMAL HIGH (ref 70–99)
Glucose-Capillary: 159 mg/dL — ABNORMAL HIGH (ref 70–99)
Glucose-Capillary: 179 mg/dL — ABNORMAL HIGH (ref 70–99)
Glucose-Capillary: 216 mg/dL — ABNORMAL HIGH (ref 70–99)

## 2024-02-28 LAB — CBC
HCT: 25.6 % — ABNORMAL LOW (ref 39.0–52.0)
Hemoglobin: 8.8 g/dL — ABNORMAL LOW (ref 13.0–17.0)
MCH: 29.7 pg (ref 26.0–34.0)
MCHC: 34.4 g/dL (ref 30.0–36.0)
MCV: 86.5 fL (ref 80.0–100.0)
Platelets: 142 K/uL — ABNORMAL LOW (ref 150–400)
RBC: 2.96 MIL/uL — ABNORMAL LOW (ref 4.22–5.81)
RDW: 14.5 % (ref 11.5–15.5)
WBC: 5.8 K/uL (ref 4.0–10.5)
nRBC: 0 % (ref 0.0–0.2)

## 2024-02-28 LAB — BASIC METABOLIC PANEL WITH GFR
Anion gap: 8 (ref 5–15)
BUN: 8 mg/dL (ref 8–23)
CO2: 21 mmol/L — ABNORMAL LOW (ref 22–32)
Calcium: 7.4 mg/dL — ABNORMAL LOW (ref 8.9–10.3)
Chloride: 110 mmol/L (ref 98–111)
Creatinine, Ser: 0.73 mg/dL (ref 0.61–1.24)
GFR, Estimated: >60 mL/min (ref >60)
Glucose, Bld: 162 mg/dL — ABNORMAL HIGH (ref 70–99)
Potassium: 3.3 mmol/L — ABNORMAL LOW (ref 3.5–5.1)
Sodium: 139 mmol/L (ref 135–145)

## 2024-02-28 LAB — MAGNESIUM: Magnesium: 2 mg/dL (ref 1.7–2.4)

## 2024-02-28 NOTE — Plan of Care (Signed)
  Problem: Education: Goal: Ability to describe self-care measures that may prevent or decrease complications (Diabetes Survival Skills Education) will improve Outcome: Progressing   Problem: Coping: Goal: Ability to adjust to condition or change in health will improve Outcome: Progressing   Problem: Metabolic: Goal: Ability to maintain appropriate glucose levels will improve Outcome: Progressing   Problem: Nutritional: Goal: Maintenance of adequate nutrition will improve Outcome: Progressing   Problem: Clinical Measurements: Goal: Will remain free from infection Outcome: Progressing Goal: Cardiovascular complication will be avoided Outcome: Progressing   Problem: Coping: Goal: Level of anxiety will decrease Outcome: Progressing

## 2024-02-28 NOTE — Plan of Care (Signed)
  Problem: Education: Goal: Ability to describe self-care measures that may prevent or decrease complications (Diabetes Survival Skills Education) will improve Outcome: Progressing   Problem: Coping: Goal: Ability to adjust to condition or change in health will improve Outcome: Progressing   Problem: Fluid Volume: Goal: Ability to maintain a balanced intake and output will improve Outcome: Progressing   Problem: Health Behavior/Discharge Planning: Goal: Ability to identify and utilize available resources and services will improve Outcome: Progressing   Problem: Health Behavior/Discharge Planning: Goal: Ability to manage health-related needs will improve Outcome: Progressing   Problem: Metabolic: Goal: Ability to maintain appropriate glucose levels will improve Outcome: Progressing   Problem: Tissue Perfusion: Goal: Adequacy of tissue perfusion will improve Outcome: Progressing

## 2024-02-28 NOTE — Progress Notes (Signed)
 PROGRESS NOTE    Matthew Calderon  FMW:[id number] DOB: 1932-07-02 DOA: 02/19/2024 PCP: Caleen Dirks, MD   Brief Narrative:    Assessment & Plan:   Principal Problem:   CAP (community acquired pneumonia) Active Problems:   Non-ST elevation (NSTEMI) myocardial infarction (HCC)   Sepsis (HCC)   Atrial fibrillation, rapid (HCC)   Acute metabolic encephalopathy   Myocardial injury   Recurrent falls   Atherosclerosis of aorta (HCC)   Coronary artery calcification   Heart failure with improved ejection fraction (HFimpEF) (HCC)   Multifocal pneumonia   Atrial fibrillation with RVR (HCC)   Diabetic ketoacidosis without coma associated with type 2 diabetes mellitus (HCC)   Brief Narrative:    89 year old with history of CAD, CHF, DM2, HTN, HLD, OSA, disorder, recurrent falls comes to the ED from [facility] came to the ED on 8/27 from a ground-level fall found to have type II dens fracture seen by neurosurgery and placed in hard collar with plans to follow-up outpatient in 2 weeks.  But now brought back to the hospital due to concerns of fever and change in mental status.  COVID/RSV/flu were negative.  CT chest abdomen pelvis shows concerns of bilateral patchy opacity, CT head negative for acute pathology, CT cervical spine is also negative.   Patient initially was in euglycemic DKA secondary Trijardy requiring insulin  drip and was transitioned to subcu but again on 9/1 went back into euglycemic DKA requiring Endo tool.  Patient again went into DKA on 9/3 Transitioned off IV insulin  to subcu 9/5 Remove central line 9/6    Assessment/plan: Euglycemic diabetic ketoacidosis Uncontrolled DM 2 secondary to hyperglycemia Severe acidosis and euglycemic DKA suspected secondary to Trijardy.  Required insulin  drip which has now been transitioned to subcu sliding scale and long-acting.  Patient went back into euglycemic DKA on 9/1 requiring DKA protocol/Endo tool.  Anion gap improved however with  discontinuation of IV fluid and insulin  overnight 9/2 patient has become acidotic with increasing anion gap. Endo tool reinitiated 9/3--> will continue.  Improvement in anion gap noted along with BHB. IV insulin  discontinued, started on subcu lispro and Lantus . -A1c 10.1.   -Patient will be discharged on subcu insulin .   Dysphagia - ST evaluation ongoing, patient is placed on modified diet.  Sepsis, multifocal pneumonia -Concerns of multifocal pneumonia seen on CT chest - Completed 3 days of azithromycin  and 7 days of Rocephin  - COVID/flu/RSV negative - Bronchodilators, I-S/flutter valve.  Supportive care   Recent ground-level fall complicated by type II dens fracture Repeat CT C-spine demonstrates slightly more displaced type II dens with 2 mm distraction. Clinically neurologically intact in all extremities Seen by neurosurgery, Dr. [name].  Hard c-collar adjusted to correct fitting.  Outpatient follow-up.   Encephalopathy, metabolic suspected setting of underlying infection Dementia CT head with no acute intracranial abnormality.  Likely in the setting of infection and acidosis.  B12 normal - TSH and ammonia are normal   Atrial fibrillation with RVR - Likely from undergoing stress briefly requiring amiodarone  drip earlier in the hospitalization. Again rapid A-fib 9/3 night, converted with IV metoprolol  On 9/4, added Cardizem  30 mg every 6 hours p.o. this will be discontinued due to softer blood pressure. continue Coreg  6.25 bid  discussed with daughter, long-term anticoagulation deferred due to risk of fall   Elevated troponin Coronary artery disease -Initially significantly elevated troponins CT showing evidence of 4 vessel disease.  Currently suspect demand ischemia.  Echocardiogram shows preserved EF, grade 3 aortic root  plaque.  Completed 48 hours of heparin  drip.  Aspirin  has been changed to Plavix .   Acute kidney injury resolved Creatinine 1.5 on admission has normalized.     Isolated elevation in T Bili  -Trend LFT   Incidental findings: Aortic atherosclerosis, 4 vessel CAD, aortic valve calcification Cholelithiasis, diverticulosis, small hiatal hernia, prostamegaly   CHF with EF 55% Resume Coreg .  Hold Entresto  due to BP   Hypertension Slowly resuming home medications Hyperlipidemia See CAD   OSA  Not on CPAP   Mood disorder  Continue home sertraline   Groin rash: Continue nystatin  powder   Recurrent falls PT/OT-SNF   DVT prophylaxis: Lovenox .     Code Status: Full Code  Continue inpatient   Consultants:   Procedures:   Antimicrobials:    Subjective:  Patient seen and examined at the bedside.  He continues to feel better.  He is alert and oriented.  Has tolerated diet.  Denies chest pain nausea or vomiting.  Reports of groin itch with fungal infection.  Objective: Vitals:   02/28/24 0721 02/28/24 1205 02/28/24 1218 02/28/24 1521  BP: (!) 117/52 (!) 93/53  (!) 95/41  Pulse: 68 72  75  Resp: 15 20 18 20   Temp: 97.6 F (36.4 C) (!) 97.5 F (36.4 C)  (!) 97.5 F (36.4 C)  TempSrc: Oral Oral  Oral  SpO2: 95% 96%  96%  Weight:      Height:        Intake/Output Summary (Last 24 hours) at 02/28/2024 1635 Last data filed at 02/28/2024 1305 Gross per 24 hour  Intake 600 ml  Output 350 ml  Net 250 ml   Filed Weights   02/26/24 0425 02/27/24 0341 02/28/24 0514  Weight: 70.6 kg 69.5 kg 70.2 kg    Examination:  General exam: Appears calm and comfortable, conversant Respiratory system: Bilateral decreased breath sounds at bases Cardiovascular system: S1 & S2 heard, Rate controlled Gastrointestinal system: Abdomen is nondistended, soft and nontender. Normal bowel sounds heard. Extremities: No cyanosis, clubbing, edema  Central nervous system: Alert and oriented. No focal neurological deficits. Moving extremities Skin: No rashes, lesions or ulcers  Data Reviewed: I have personally reviewed following labs and imaging  studies  CBC: Recent Labs  Lab 02/24/24 0440 02/25/24 0834 02/26/24 0428 02/27/24 0403 02/28/24 0500  WBC 9.8 11.7* 8.0 6.2 5.8  HGB 10.1* 11.2* 10.3* 9.6* 8.8*  HCT 30.4* 34.3* 30.4* 27.6* 25.6*  MCV 89.1 89.6 87.4 85.7 86.5  PLT 136* 166 160 137* 142*   Basic Metabolic Panel: Recent Labs  Lab 02/24/24 0617 02/24/24 1328 02/25/24 0834 02/25/24 1804 02/26/24 0428 02/26/24 0930 02/26/24 1606 02/27/24 0100 02/27/24 0403 02/28/24 0500  NA 139   < > 141   < > 141 140 139 139  --  139  K 3.2*   < > 3.5   < > 3.1* 5.1 3.6 3.3*  --  3.3*  CL 110   < > 110   < > 114* 114* 111 112*  --  110  CO2 16*   < > 8*   < > 14* 14* 17* 20*  --  21*  GLUCOSE 116*   < > 110*   < > 197* 196* 200* 200*  --  162*  BUN 7*   < > 9   < > 6* 5* <5* <5*  --  8  CREATININE 0.93   < > 1.13   < > 1.04 0.86 0.77 0.66  --  0.73  CALCIUM  7.6*   < > 7.6*   < > 7.7* 7.6* 7.7* 7.6*  --  7.4*  MG 1.7  --  2.0  --  1.8  --   --   --  1.6* 2.0   < > = values in this interval not displayed.   GFR: Estimated Creatinine Clearance: 50.2 mL/min (by C-G formula based on SCr of 0.73 mg/dL). Liver Function Tests: No results for input(s): AST, ALT, ALKPHOS, BILITOT, PROT, ALBUMIN in the last 168 hours.  No results for input(s): LIPASE, AMYLASE in the last 168 hours. No results for input(s): AMMONIA in the last 168 hours.  Coagulation Profile: No results for input(s): INR, PROTIME in the last 168 hours.  Cardiac Enzymes: No results for input(s): CKTOTAL, CKMB, CKMBINDEX, TROPONINI in the last 168 hours. BNP (last 3 results) No results for input(s): PROBNP in the last 8760 hours. HbA1C: No results for input(s): HGBA1C in the last 72 hours. CBG: Recent Labs  Lab 02/27/24 1620 02/27/24 2144 02/28/24 0014 02/28/24 0609 02/28/24 1206  GLUCAP 164* 164* 179* 139* 216*   Lipid Profile: No results for input(s): CHOL, HDL, LDLCALC, TRIG, CHOLHDL, LDLDIRECT in the  last 72 hours. Thyroid  Function Tests: No results for input(s): TSH, T4TOTAL, FREET4, T3FREE, THYROIDAB in the last 72 hours. Anemia Panel: No results for input(s): VITAMINB12, FOLATE, FERRITIN, TIBC, IRON, RETICCTPCT in the last 72 hours. Sepsis Labs: Recent Labs  Lab 02/23/24 0256 02/23/24 1159 02/23/24 1534  PROCALCITON 0.67  --   --   LATICACIDVEN  --  0.8 0.9    Recent Results (from the past 240 hours)  Blood Culture (routine x 2)     Status: None   Collection Time: 02/19/24  8:09 PM   Specimen: BLOOD RIGHT FOREARM  Result Value Ref Range Status   Specimen Description BLOOD RIGHT FOREARM  Final   Special Requests   Final    BOTTLES DRAWN AEROBIC AND ANAEROBIC Blood Culture adequate volume   Culture   Final    NO GROWTH 5 DAYS Performed at [facility], [address] [place], [place] [address]    Report Status 02/24/2024 FINAL  Final  Blood Culture (routine x 2)     Status: None   Collection Time: 02/19/24  8:51 PM   Specimen: BLOOD  Result Value Ref Range Status   Specimen Description BLOOD RIGHT ANTECUBITAL  Final   Special Requests   Final    BOTTLES DRAWN AEROBIC AND ANAEROBIC Blood Culture adequate volume   Culture   Final    NO GROWTH 5 DAYS Performed at [facility], [address], [place], [place] [address]    Report Status 02/24/2024 FINAL  Final  Resp panel by RT-PCR (RSV, Flu A&B, Covid) Anterior Nasal Swab     Status: None   Collection Time: 02/19/24  8:52 PM   Specimen: Anterior Nasal Swab  Result Value Ref Range Status   SARS Coronavirus 2 by RT PCR NEGATIVE NEGATIVE Final   Influenza A by PCR NEGATIVE NEGATIVE Final   Influenza B by PCR NEGATIVE NEGATIVE Final    Comment: (NOTE) The Xpert Xpress SARS-CoV-2/FLU/RSV plus assay is intended as an aid in the diagnosis of influenza from Nasopharyngeal swab specimens and should not be used as a sole basis for treatment. Nasal washings and aspirates are unacceptable  for Xpert Xpress SARS-CoV-2/FLU/RSV testing.  Fact Sheet for Patients: BloggerCourse.com  Fact Sheet for Healthcare Providers: SeriousBroker.it  This test is not yet  approved or cleared by the [place]  FDA and has been authorized for detection and/or diagnosis of SARS-CoV-2 by FDA under an Emergency Use Authorization (EUA). This EUA will remain in effect (meaning this test can be used) for the duration of the COVID-19 declaration under Section 564(b)(1) of the Act, 21 U.S.C. section 360bbb-3(b)(1), unless the authorization is terminated or revoked.     Resp Syncytial Virus by PCR NEGATIVE NEGATIVE Final    Comment: (NOTE) Fact Sheet for Patients: BloggerCourse.com  Fact Sheet for Healthcare Providers: SeriousBroker.it  This test is not yet approved or cleared by the [place]  FDA and has been authorized for detection and/or diagnosis of SARS-CoV-2 by FDA under an Emergency Use Authorization (EUA). This EUA will remain in effect (meaning this test can be used) for the duration of the COVID-19 declaration under Section 564(b)(1) of the Act, 21 U.S.C. section 360bbb-3(b)(1), unless the authorization is terminated or revoked.  Performed at [facility], [address], [place], [place] [address]   Respiratory (~20 pathogens) panel by PCR     Status: None   Collection Time: 02/20/24  3:40 AM   Specimen: Nasopharyngeal Swab; Respiratory  Result Value Ref Range Status   Adenovirus NOT DETECTED NOT DETECTED Final   Coronavirus 229E NOT DETECTED NOT DETECTED Final    Comment: (NOTE) The Coronavirus on the Respiratory Panel, DOES NOT test for the novel  Coronavirus (2019 nCoV)    Coronavirus HKU1 NOT DETECTED NOT DETECTED Final   Coronavirus NL63 NOT DETECTED NOT DETECTED Final   Coronavirus OC43 NOT DETECTED NOT DETECTED Final   Metapneumovirus NOT DETECTED NOT  DETECTED Final   Rhinovirus / Enterovirus NOT DETECTED NOT DETECTED Final   Influenza A NOT DETECTED NOT DETECTED Final   Influenza B NOT DETECTED NOT DETECTED Final   Parainfluenza Virus 1 NOT DETECTED NOT DETECTED Final   Parainfluenza Virus 2 NOT DETECTED NOT DETECTED Final   Parainfluenza Virus 3 NOT DETECTED NOT DETECTED Final   Parainfluenza Virus 4 NOT DETECTED NOT DETECTED Final   Respiratory Syncytial Virus NOT DETECTED NOT DETECTED Final   Bordetella pertussis NOT DETECTED NOT DETECTED Final   Bordetella Parapertussis NOT DETECTED NOT DETECTED Final   Chlamydophila pneumoniae NOT DETECTED NOT DETECTED Final   Mycoplasma pneumoniae NOT DETECTED NOT DETECTED Final    Comment: Performed at [facility], [address], [place], [place] [address]  MRSA Next Gen by PCR, Nasal     Status: None   Collection Time: 02/20/24  3:40 AM   Specimen: Nasal Mucosa; Nasal Swab  Result Value Ref Range Status   MRSA by PCR Next Gen NOT DETECTED NOT DETECTED Final    Comment: (NOTE) The GeneXpert MRSA Assay (FDA approved for NASAL specimens only), is one component of a comprehensive MRSA colonization surveillance program. It is not intended to diagnose MRSA infection nor to guide or monitor treatment for MRSA infections. Test performance is not FDA approved in patients less than 99 years old. Performed at [facility], [address], [place], [place] [address]          Radiology Studies: DG Swallowing Func-Speech Pathology Result Date: 02/27/2024 Table formatting from the original result was not included. Modified Barium Swallow Study Patient Details Name: Matthew Calderon MRN: [id number] Date of Birth: [birth year] Today's Date: 02/27/2024 HPI/PMH: HPI: Matthew Calderon is an 89 year old male who presented to the ED from [facility] came to the ED on 8/27 from a ground-level fall found  to have type II dens fracture seen by neurosurgery and placed in hard collar with plans to follow-up  outpatient in 2 weeks.  But now brought back to the hospital 8/28 due to concerns of fever and change in mental status. CT chest 8/28: Bilateral lower lobe patchy airspace opacities. Finding may represent atelectasis with superimposed infection.  COVID/RSV/influenza negative. Pt reporting nonproductive cough and dysphagia in ED.  Pt with history of CAD, CHF, DM2, HTN, HLD, OSA, disorder, recurrent falls. BSE completed on 8/29 and was placed on regular thin with recommendations for a MBS. Per radiology staff, RN was not able to come with pt at the time of his first schedule MBS. On 9/1, Diet NPO due to Diabetes Ketoacidosis (DKA). Mentation improved and able to complete MBS 9/5 with recs for Reg/Mildly thick liquids. Clinical Impression: Pt presents with a moderate oropharyngeal dysphagia c/b lingual discoordination, delayed swallow intiation, reduced base of tongue retraction, decreased hyolaryngeal excursion, incomplete laryngeal closure and diminished sensation.  These deficits resulted in silent aspiration of thin liquid intermittently during the swallow.  There was no penetration or aspiration of any other consistencies trialed.  Suspect presence of hard cervical collar is impacting swallow function holding pt's head/neck in position during swallow. Pill simulation was completed with nectar thick liquid with no penetration or aspiration observed, but there was retention and backflow of contrast within the esophagus on sweep.  Recommend regular texture diet with nectar thick liquids.  Pt may take medications whole with nectar thick liquids. Pt may have ice chips and sips of unthickened water in between meals, in moderation, after good oral care, when fully awake/alert, with upright positioning and direct supervision. DIGEST Swallow Severity Rating*  Safety: 2  Efficiency: 0  Overall Pharyngeal Swallow Severity: 2 1: mild; 2: moderate; 3: severe; 4: profound *The Dynamic Imaging Grade of Swallowing Toxicity is  standardized for the head and neck cancer population, however, demonstrates promising clinical applications across populations to standardize the clinical rating of pharyngeal swallow safety and severity. Factors that may increase risk of adverse event in presence of aspiration Noe & Lianne 2021): Factors that may increase risk of adverse event in presence of aspiration Noe & Lianne 2021): Reduced cognitive function; Limited mobility (2/2 cervical collar) Recommendations/Plan: Swallowing Evaluation Recommendations Swallowing Evaluation Recommendations Recommendations: PO diet PO Diet Recommendation: Regular; Mildly thick liquids (Level 2, nectar thick) Liquid Administration via: Cup; Straw Medication Administration: Whole meds with liquid Supervision: Staff to assist with self-feeding Swallowing strategies  : Slow rate; Small bites/sips Postural changes: Position pt fully upright for meals; Stay upright 30-60 min after meals Oral care recommendations: Oral care BID (2x/day) Treatment Plan Treatment Plan Treatment recommendations: Therapy as outlined in treatment plan below Follow-up recommendations: -- (ST at next level of care. Recommend repeat MBSS once cervical collar is discontinued) Functional status assessment: Patient has had a recent decline in their functional status and demonstrates the ability to make significant improvements in function in a reasonable and predictable amount of time. Treatment frequency: Min 2x/week Treatment duration: 2 weeks Interventions: Aspiration precaution training; Diet toleration management by SLP; Compensatory techniques; Patient/family education Recommendations Recommendations for follow up therapy are one component of a multi-disciplinary discharge planning process, led by the attending physician.  Recommendations may be updated based on patient status, additional functional criteria and insurance authorization. Assessment: Orofacial Exam: Orofacial Exam Oral  Cavity: Oral Hygiene: WFL Oral Cavity - Dentition: Adequate natural dentition Orofacial Anatomy: WFL Oral Motor/Sensory Function: -- (See BSE) Anatomy: Anatomy: -- (  Presence of cervical collar) Boluses Administered: Boluses Administered Boluses Administered: Thin liquids (Level 0); Mildly thick liquids (Level 2, nectar thick); Moderately thick liquids (Level 3, honey thick); Puree; Solid  Oral Impairment Domain: Oral Impairment Domain Lip Closure: No labial escape Tongue control during bolus hold: Posterior escape of less than half of bolus Bolus preparation/mastication: Disorganized chewing/mashing with solid pieces of bolus unchewed Bolus transport/lingual motion: Repetitive/disorganized tongue motion Oral residue: Trace residue lining oral structures Location of oral residue : Tongue Initiation of pharyngeal swallow : Pyriform sinuses  Pharyngeal Impairment Domain: Pharyngeal Impairment Domain Soft palate elevation: No bolus between soft palate (SP)/pharyngeal wall (PW) Laryngeal elevation: Partial superior movement of thyroid  cartilage/partial approximation of arytenoids to epiglottic petiole Anterior hyoid excursion: Partial anterior movement Epiglottic movement: Complete inversion Laryngeal vestibule closure: Incomplete, narrow column air/contrast in laryngeal vestibule Pharyngeal stripping wave : Present - diminished Pharyngeal contraction (A/P view only): N/A Pharyngoesophageal segment opening: Complete distension and complete duration, no obstruction of flow Tongue base retraction: Trace column of contrast or air between tongue base and PPW Pharyngeal residue: Trace residue within or on pharyngeal structures Location of pharyngeal residue: Valleculae; Pyriform sinuses  Esophageal Impairment Domain: Esophageal Impairment Domain Esophageal clearance upright position: Esophageal retention with retrograde flow below pharyngoesophageal segment (PES) Pill: Pill Consistency administered: Mildly thick liquids  (Level 2, nectar thick) Mildly thick liquids (Level 2, nectar thick): WFL Penetration/Aspiration Scale Score: Penetration/Aspiration Scale Score 1.  Material does not enter airway: Mildly thick liquids (Level 2, nectar thick); Moderately thick liquids (Level 3, honey thick); Puree; Solid; Pill 8.  Material enters airway, passes BELOW cords without attempt by patient to eject out (silent aspiration) : Thin liquids (Level 0) Compensatory Strategies: Compensatory Strategies Compensatory strategies: Yes Straw: Ineffective Ineffective Straw: Thin liquid (Level 0) Chin tuck: -- (Unable 2/2 presence of hard cervical collar) Other(comment): Ineffective (Cup sips) Ineffective Other(comment): Thin liquid (Level 0)   General Information: Caregiver present: No  Diet Prior to this Study: NPO   No data recorded  No data recorded  Supplemental O2: Nasal cannula   History of Recent Intubation: No  Behavior/Cognition: Alert; Cooperative; Pleasant mood Self-Feeding Abilities: Able to self-feed; Needs assist with self-feeding Baseline vocal quality/speech: Normal Volitional Cough: Able to elicit No data recorded Exam Limitations: No limitations Goal Planning: Prognosis for improved oropharyngeal function: Good No data recorded No data recorded No data recorded Consulted and agree with results and recommendations: Patient; Nurse; Physician Pain: Pain Assessment Faces Pain Scale: 0 Breathing: 0 Negative Vocalization: 0 Facial Expression: 0 Body Language: 0 Consolability: 0 PAINAD Score: 0 End of Session: Start Time:SLP Start Time (ACUTE ONLY): 1027 Stop Time: SLP Stop Time (ACUTE ONLY): 1044 Time Calculation:SLP Time Calculation (min) (ACUTE ONLY): 17 min Charges: SLP Evaluations $ SLP Speech Visit: 1 Visit SLP Evaluations $MBS Swallow: 1 Procedure $Swallowing Treatment: 1 Procedure SLP visit diagnosis: SLP Visit Diagnosis: Dysphagia, oropharyngeal phase (R13.12) Past Medical History: Past Medical History: Diagnosis Date  Anemia   takes  iron pill  Anxiety   Arthritis   Depression   Diabetes mellitus without complication (HCC)   GERD (gastroesophageal reflux disease)   pepto  H/O hiatal hernia   Pneumonia   4 years ago  PONV (postoperative nausea and vomiting) 05/14/2019  Post-nasal drip   Prostate hyperplasia, benign localized, without urinary obstruction   Seasonal allergies   Sleep apnea   no CPAP Past Surgical History: Past Surgical History: Procedure Laterality Date  BACK SURGERY  1995  discectomy  COLONOSCOPY W/ BIOPSIES  AND POLYPECTOMY    EYE SURGERY Bilateral   cataracts  EYE SURGERY Bilateral   glaucoma shunts  JOINT REPLACEMENT Left 1992  knee  JOINT REPLACEMENT Right 1996  knee  JOINT REPLACEMENT Right 2009  hip  JOINT REPLACEMENT Left 2005  hip  KNEE ARTHROSCOPY Left 1981  KNEE ARTHROSCOPY WITH PATELLA RECONSTRUCTION Right 2010  KNEE SURGERY Bilateral 1954  knee surgery and placed in a cast for 6 weeks  KNEE SURGERY  1961  cartilage removed  LEG SURGERY Bilateral 1974, 1975  straighten legs  SHOULDER OPEN ROTATOR CUFF REPAIR Right 2010  TONSILLECTOMY    TOTAL HIP ARTHROPLASTY Right 01/13/2014  Procedure: TOTAL HIP ARTHROPLASTY ANTERIOR APPROACH;  Surgeon: Maude KANDICE Herald, MD;  Location: [facility] OR;  Service: Orthopedics;  Laterality: Right; Anette FORBES Grippe, MA, CCC-SLP Acute Rehabilitation Services Office: [phone] 02/27/2024, 11:24 AM       Scheduled Meds:  atorvastatin   40 mg Oral QPM   brimonidine   1 drop Both Eyes BID   carvedilol   6.25 mg Oral BID WC   Chlorhexidine  Gluconate Cloth  6 each Topical Daily   cholecalciferol   1,000 Units Oral Daily   finasteride   5 mg Oral QPM   heparin  injection (subcutaneous)  5,000 Units Subcutaneous Q8H   insulin  aspart  0-9 Units Subcutaneous TID WC   insulin  glargine  15 Units Subcutaneous Daily   nystatin    Topical TID   sertraline   50 mg Oral QPM   sodium chloride  flush  10-40 mL Intracatheter Q12H   sodium chloride  flush  3 mL Intravenous Q12H   tamsulosin   0.4 mg Oral QHS    timolol   1 drop Both Eyes BID   Continuous Infusions:          Donnavin Vandenbrink, MD [facility] 02/28/2024, 4:35 PM

## 2024-02-29 LAB — BASIC METABOLIC PANEL WITH GFR
Anion gap: 12 (ref 5–15)
BUN: 9 mg/dL (ref 8–23)
CO2: 20 mmol/L — ABNORMAL LOW (ref 22–32)
Calcium: 7.4 mg/dL — ABNORMAL LOW (ref 8.9–10.3)
Chloride: 106 mmol/L (ref 98–111)
Creatinine, Ser: 0.77 mg/dL (ref 0.61–1.24)
GFR, Estimated: >60 mL/min (ref >60)
Glucose, Bld: 124 mg/dL — ABNORMAL HIGH (ref 70–99)
Potassium: 3.4 mmol/L — ABNORMAL LOW (ref 3.5–5.1)
Sodium: 138 mmol/L (ref 135–145)

## 2024-02-29 LAB — MAGNESIUM: Magnesium: 1.8 mg/dL (ref 1.7–2.4)

## 2024-02-29 LAB — CBC
HCT: 27.5 % — ABNORMAL LOW (ref 39.0–52.0)
Hemoglobin: 9.4 g/dL — ABNORMAL LOW (ref 13.0–17.0)
MCH: 29.9 pg (ref 26.0–34.0)
MCHC: 34.2 g/dL (ref 30.0–36.0)
MCV: 87.6 fL (ref 80.0–100.0)
Platelets: 168 K/uL (ref 150–400)
RBC: 3.14 MIL/uL — ABNORMAL LOW (ref 4.22–5.81)
RDW: 14.4 % (ref 11.5–15.5)
WBC: 5.9 K/uL (ref 4.0–10.5)
nRBC: 0 % (ref 0.0–0.2)

## 2024-02-29 LAB — GLUCOSE, CAPILLARY
Glucose-Capillary: 123 mg/dL — ABNORMAL HIGH (ref 70–99)
Glucose-Capillary: 180 mg/dL — ABNORMAL HIGH (ref 70–99)
Glucose-Capillary: 162 mg/dL — ABNORMAL HIGH (ref 70–99)
Glucose-Capillary: 170 mg/dL — ABNORMAL HIGH (ref 70–99)

## 2024-02-29 MED ORDER — POTASSIUM CHLORIDE 20 MEQ PO PACK
20.0000 meq | PACK | Freq: Two times a day (BID) | ORAL | Status: AC
  Administered 2024-02-29 (×2): 20 meq via ORAL
  Filled 2024-02-29 (×2): qty 1

## 2024-02-29 NOTE — Progress Notes (Signed)
 [facility] night cross cover note:   I was notified by the patient's RN that peripheral IV access has been lost this AM in this pt who is anticipated to be discharged today. In light of this, will refrain from replacement of iv at this time.     Eva Pore, DO Hospitalist

## 2024-02-29 NOTE — TOC Progression Note (Addendum)
 Transition of Care [facility]) - Progression Note    Patient Details  Name: Matthew Calderon MRN: [id number] Date of Birth: May 29, 1933  Transition [facility]) CM/SW Contact  Olam FORBES Ally, LCSW Phone Number: 02/29/2024, 12:20 PM  Clinical Narrative:     CSW called [facility] to ascertain if pt is medically ready today, could they accept pt. CSW had to leave voicemail.  12:55 PM- CSW received a call back from [place] at [facility] and he explained that they could accept patient tomorrow for admission.  TOC team will continue to assist with discharge planning needs.    Expected Discharge Plan: Skilled Nursing Facility Barriers to Discharge: Continued Medical Work up               Expected Discharge Plan and Services In-house Referral: Clinical Social Work     Living arrangements for the past 2 months: Assisted Living Facility                                       Social Drivers of Health (SDOH) Interventions SDOH Screenings   Food Insecurity: No Food Insecurity (08/12/2023)  Housing: Low Risk  (08/12/2023)  Transportation Needs: No Transportation Needs (08/12/2023)  Utilities: Not At Risk (08/12/2023)  Social Connections: Unknown (08/12/2023)  Tobacco Use: Medium Risk (02/19/2024)    Readmission Risk Interventions     No data to display

## 2024-02-29 NOTE — Progress Notes (Addendum)
 PROGRESS NOTE    Matthew Calderon  FMW:[id number] DOB: [birth year] DOA: 02/19/2024 PCP: Caleen Dirks, MD   Brief Narrative:    Assessment & Plan:   Principal Problem:   CAP (community acquired pneumonia) Active Problems:   Non-ST elevation (NSTEMI) myocardial infarction (HCC)   Sepsis (HCC)   Atrial fibrillation, rapid (HCC)   Acute metabolic encephalopathy   Myocardial injury   Recurrent falls   Atherosclerosis of aorta (HCC)   Coronary artery calcification   Heart failure with improved ejection fraction (HFimpEF) (HCC)   Multifocal pneumonia   Atrial fibrillation with RVR (HCC)   Diabetic ketoacidosis without coma associated with type 2 diabetes mellitus (HCC)   Brief Narrative:    89 year old with history of CAD, CHF, DM2, HTN, HLD, OSA, disorder, recurrent falls comes to the ED from [facility] came to the ED on 8/27 from a ground-level fall found to have type II dens fracture seen by neurosurgery and placed in hard collar with plans to follow-up outpatient in 2 weeks.  But now brought back to the hospital due to concerns of fever and change in mental status.  COVID/RSV/flu were negative.  CT chest abdomen pelvis shows concerns of bilateral patchy opacity, CT head negative for acute pathology, CT cervical spine is also negative.   Patient initially was in euglycemic DKA secondary Trijardy requiring insulin  drip and was transitioned to subcu but again on 9/1 went back into euglycemic DKA requiring Endo tool.  Patient again went into DKA on 9/3 Transitioned off IV insulin  to subcu 9/5 Remove central line 9/6    Assessment/plan: Euglycemic diabetic ketoacidosis Uncontrolled DM 2 secondary to hyperglycemia Severe acidosis and euglycemic DKA suspected secondary to Trijardy.  Required insulin  drip which has now been transitioned to subcu sliding scale and long-acting.  Patient went back into euglycemic DKA on 9/1 requiring DKA protocol/Endo tool.  Anion gap improved however with  discontinuation of IV fluid and insulin  overnight 9/2 patient has become acidotic with increasing anion gap. Endo tool reinitiated 9/3--> subsequently switched to subcu insulin  9/5 -A1c 10.1.   -Patient will be discharged on subcu insulin .   Dysphagia - ST evaluation ongoing, patient is placed on modified diet.  Sepsis, multifocal pneumonia -Concerns of multifocal pneumonia seen on CT chest - Completed 3 days of azithromycin  and 7 days of Rocephin  - COVID/flu/RSV negative - Bronchodilators, I-S/flutter valve.  Supportive care   Recent ground-level fall complicated by type II dens fracture Repeat CT C-spine demonstrates slightly more displaced type II dens with 2 mm distraction. Clinically neurologically intact in all extremities Seen by neurosurgery, Dr. [name].  Hard c-collar adjusted to correct fitting.  Outpatient follow-up.   Encephalopathy, metabolic suspected setting of underlying infection Dementia CT head with no acute intracranial abnormality.  Likely in the setting of infection and acidosis.  B12 normal - TSH and ammonia are normal   Atrial fibrillation with RVR - Likely from undergoing stress briefly requiring amiodarone  drip earlier in the hospitalization. Again rapid A-fib 9/3 night, converted with IV metoprolol  On 9/4, added Cardizem  30 mg every 6 hours p.o. this will be discontinued due to softer blood pressure. continue Coreg  6.25 bid  discussed with daughter, long-term anticoagulation deferred due to risk of fall Potassium supplement today   Elevated troponin Coronary artery disease -Initially significantly elevated troponins CT showing evidence of 4 vessel disease.  Currently suspect demand ischemia.  Echocardiogram shows preserved EF, grade 3 aortic root plaque.  Completed 48 hours of heparin  drip.  Aspirin  has  been changed to Plavix .   Acute kidney injury resolved Creatinine 1.5 on admission has normalized.    Isolated elevation in T Bili  -Trend LFT    Incidental findings: Aortic atherosclerosis, 4 vessel CAD, aortic valve calcification Cholelithiasis, diverticulosis, small hiatal hernia, prostamegaly   CHF with EF 55% Continue Coreg .  Hold Entresto  due to BP   Hypertension Slowly resuming home medications Hyperlipidemia See CAD   OSA  Not on CPAP   Mood disorder  Continue home sertraline   Groin rash: Continue nystatin  powder   Recurrent falls PT/OT-SNF   DVT prophylaxis: Lovenox .     Code Status: Full Code  Disposition: Anticipate discharge to nursing home tomorrow  Subjective:  Patient seen and examined at the bedside.  He continues to feel better.  He is alert and oriented.  Has tolerated diet.  Denies chest pain nausea or vomiting.  Reports of groin itch with fungal infection.  Objective: Vitals:   02/29/24 0408 02/29/24 0802 02/29/24 1053 02/29/24 1101  BP: (!) 123/56 134/60 (!) 94/46 (!) 104/48  Pulse: 85 89 89 86  Resp: 20 19 19 19   Temp: (!) 97.5 F (36.4 C) 98.1 F (36.7 C) 98.6 F (37 C) 98.6 F (37 C)  TempSrc: Oral Oral Oral Oral  SpO2: 93% 94% 98% 94%  Weight: 70.2 kg     Height:        Intake/Output Summary (Last 24 hours) at 02/29/2024 1448 Last data filed at 02/29/2024 1236 Gross per 24 hour  Intake 834 ml  Output 1150 ml  Net -316 ml   Filed Weights   02/27/24 0341 02/28/24 0514 02/29/24 0408  Weight: 69.5 kg 70.2 kg 70.2 kg    Examination:  General exam: Appears calm and comfortable, conversant Respiratory system: Bilateral decreased breath sounds at bases Cardiovascular system: S1 & S2 heard, Rate controlled Gastrointestinal system: Abdomen is nondistended, soft and nontender. Normal bowel sounds heard. Extremities: No cyanosis, clubbing, edema  Central nervous system: Alert and oriented. No focal neurological deficits. Moving extremities Skin: No rashes, lesions or ulcers  Data Reviewed: I have personally reviewed following labs and imaging studies  CBC: Recent Labs  Lab  02/25/24 0834 02/26/24 0428 02/27/24 0403 02/28/24 0500 02/29/24 0614  WBC 11.7* 8.0 6.2 5.8 5.9  HGB 11.2* 10.3* 9.6* 8.8* 9.4*  HCT 34.3* 30.4* 27.6* 25.6* 27.5*  MCV 89.6 87.4 85.7 86.5 87.6  PLT 166 160 137* 142* 168   Basic Metabolic Panel: Recent Labs  Lab 02/25/24 0834 02/25/24 1804 02/26/24 0428 02/26/24 0930 02/26/24 1606 02/27/24 0100 02/27/24 0403 02/28/24 0500 02/29/24 0614  NA 141   < > 141 140 139 139  --  139 138  K 3.5   < > 3.1* 5.1 3.6 3.3*  --  3.3* 3.4*  CL 110   < > 114* 114* 111 112*  --  110 106  CO2 8*   < > 14* 14* 17* 20*  --  21* 20*  GLUCOSE 110*   < > 197* 196* 200* 200*  --  162* 124*  BUN 9   < > 6* 5* <5* <5*  --  8 9  CREATININE 1.13   < > 1.04 0.86 0.77 0.66  --  0.73 0.77  CALCIUM  7.6*   < > 7.7* 7.6* 7.7* 7.6*  --  7.4* 7.4*  MG 2.0  --  1.8  --   --   --  1.6* 2.0 1.8   < > = values in this interval  not displayed.   GFR: Estimated Creatinine Clearance: 50.2 mL/min (by C-G formula based on SCr of 0.77 mg/dL). Liver Function Tests: No results for input(s): AST, ALT, ALKPHOS, BILITOT, PROT, ALBUMIN in the last 168 hours.  No results for input(s): LIPASE, AMYLASE in the last 168 hours. No results for input(s): AMMONIA in the last 168 hours.  Coagulation Profile: No results for input(s): INR, PROTIME in the last 168 hours.  Cardiac Enzymes: No results for input(s): CKTOTAL, CKMB, CKMBINDEX, TROPONINI in the last 168 hours. BNP (last 3 results) No results for input(s): PROBNP in the last 8760 hours. HbA1C: No results for input(s): HGBA1C in the last 72 hours. CBG: Recent Labs  Lab 02/28/24 1638 02/28/24 1736 02/28/24 2040 02/29/24 0609 02/29/24 1052  GLUCAP 159* 139* 155* 123* 180*   Lipid Profile: No results for input(s): CHOL, HDL, LDLCALC, TRIG, CHOLHDL, LDLDIRECT in the last 72 hours. Thyroid  Function Tests: No results for input(s): TSH, T4TOTAL, FREET4, T3FREE,  THYROIDAB in the last 72 hours. Anemia Panel: No results for input(s): VITAMINB12, FOLATE, FERRITIN, TIBC, IRON, RETICCTPCT in the last 72 hours. Sepsis Labs: Recent Labs  Lab 02/23/24 0256 02/23/24 1159 02/23/24 1534  PROCALCITON 0.67  --   --   LATICACIDVEN  --  0.8 0.9    Recent Results (from the past 240 hours)  Blood Culture (routine x 2)     Status: None   Collection Time: 02/19/24  8:09 PM   Specimen: BLOOD RIGHT FOREARM  Result Value Ref Range Status   Specimen Description BLOOD RIGHT FOREARM  Final   Special Requests   Final    BOTTLES DRAWN AEROBIC AND ANAEROBIC Blood Culture adequate volume   Culture   Final    NO GROWTH 5 DAYS Performed at [facility], [address], [place], [place] [address]    Report Status 02/24/2024 FINAL  Final  Blood Culture (routine x 2)     Status: None   Collection Time: 02/19/24  8:51 PM   Specimen: BLOOD  Result Value Ref Range Status   Specimen Description BLOOD RIGHT ANTECUBITAL  Final   Special Requests   Final    BOTTLES DRAWN AEROBIC AND ANAEROBIC Blood Culture adequate volume   Culture   Final    NO GROWTH 5 DAYS Performed at [facility], [address], [place], [place] [address]    Report Status 02/24/2024 FINAL  Final  Resp panel by RT-PCR (RSV, Flu A&B, Covid) Anterior Nasal Swab     Status: None   Collection Time: 02/19/24  8:52 PM   Specimen: Anterior Nasal Swab  Result Value Ref Range Status   SARS Coronavirus 2 by RT PCR NEGATIVE NEGATIVE Final   Influenza A by PCR NEGATIVE NEGATIVE Final   Influenza B by PCR NEGATIVE NEGATIVE Final    Comment: (NOTE) The Xpert Xpress SARS-CoV-2/FLU/RSV plus assay is intended as an aid in the diagnosis of influenza from Nasopharyngeal swab specimens and should not be used as a sole basis for treatment. Nasal washings and aspirates are unacceptable for Xpert Xpress SARS-CoV-2/FLU/RSV testing.  Fact Sheet for  Patients: BloggerCourse.com  Fact Sheet for Healthcare Providers: SeriousBroker.it  This test is not yet approved or cleared by the [place]  FDA and has been authorized for detection and/or diagnosis of SARS-CoV-2 by FDA under an Emergency Use Authorization (EUA). This EUA will remain in effect (meaning this test can be used) for the duration of the COVID-19 declaration under Section 564(b)(1) of the  Act, 21 U.S.C. section 360bbb-3(b)(1), unless the authorization is terminated or revoked.     Resp Syncytial Virus by PCR NEGATIVE NEGATIVE Final    Comment: (NOTE) Fact Sheet for Patients: BloggerCourse.com  Fact Sheet for Healthcare Providers: SeriousBroker.it  This test is not yet approved or cleared by the [place]  FDA and has been authorized for detection and/or diagnosis of SARS-CoV-2 by FDA under an Emergency Use Authorization (EUA). This EUA will remain in effect (meaning this test can be used) for the duration of the COVID-19 declaration under Section 564(b)(1) of the Act, 21 U.S.C. section 360bbb-3(b)(1), unless the authorization is terminated or revoked.  Performed at [facility], [address] [place], [place] [address]   Respiratory (~20 pathogens) panel by PCR     Status: None   Collection Time: 02/20/24  3:40 AM   Specimen: Nasopharyngeal Swab; Respiratory  Result Value Ref Range Status   Adenovirus NOT DETECTED NOT DETECTED Final   Coronavirus 229E NOT DETECTED NOT DETECTED Final    Comment: (NOTE) The Coronavirus on the Respiratory Panel, DOES NOT test for the novel  Coronavirus (2019 nCoV)    Coronavirus HKU1 NOT DETECTED NOT DETECTED Final   Coronavirus NL63 NOT DETECTED NOT DETECTED Final   Coronavirus OC43 NOT DETECTED NOT DETECTED Final   Metapneumovirus NOT DETECTED NOT DETECTED Final   Rhinovirus / Enterovirus NOT DETECTED NOT  DETECTED Final   Influenza A NOT DETECTED NOT DETECTED Final   Influenza B NOT DETECTED NOT DETECTED Final   Parainfluenza Virus 1 NOT DETECTED NOT DETECTED Final   Parainfluenza Virus 2 NOT DETECTED NOT DETECTED Final   Parainfluenza Virus 3 NOT DETECTED NOT DETECTED Final   Parainfluenza Virus 4 NOT DETECTED NOT DETECTED Final   Respiratory Syncytial Virus NOT DETECTED NOT DETECTED Final   Bordetella pertussis NOT DETECTED NOT DETECTED Final   Bordetella Parapertussis NOT DETECTED NOT DETECTED Final   Chlamydophila pneumoniae NOT DETECTED NOT DETECTED Final   Mycoplasma pneumoniae NOT DETECTED NOT DETECTED Final    Comment: Performed at [facility], [address] [place], [place] [address]  MRSA Next Gen by PCR, Nasal     Status: None   Collection Time: 02/20/24  3:40 AM   Specimen: Nasal Mucosa; Nasal Swab  Result Value Ref Range Status   MRSA by PCR Next Gen NOT DETECTED NOT DETECTED Final    Comment: (NOTE) The GeneXpert MRSA Assay (FDA approved for NASAL specimens only), is one component of a comprehensive MRSA colonization surveillance program. It is not intended to diagnose MRSA infection nor to guide or monitor treatment for MRSA infections. Test performance is not FDA approved in patients less than 25 years old. Performed at [facility], [address], [place], [place] [address]          Radiology Studies: No results found.       Scheduled Meds:  atorvastatin   40 mg Oral QPM   brimonidine   1 drop Both Eyes BID   carvedilol   6.25 mg Oral BID WC   Chlorhexidine  Gluconate Cloth  6 each Topical Daily   cholecalciferol   1,000 Units Oral Daily   finasteride   5 mg Oral QPM   heparin  injection (subcutaneous)  5,000 Units Subcutaneous Q8H   insulin  aspart  0-9 Units Subcutaneous TID WC   insulin  glargine  15 Units Subcutaneous Daily   nystatin    Topical TID   potassium chloride   20 mEq Oral BID   sertraline   50  mg Oral QPM   sodium chloride   flush  10-40 mL Intracatheter Q12H   sodium chloride  flush  3 mL Intravenous Q12H   tamsulosin   0.4 mg Oral QHS   timolol   1 drop Both Eyes BID   Continuous Infusions:          Karmon Andis, MD [facility] 02/29/2024, 2:48 PM

## 2024-02-29 NOTE — Plan of Care (Signed)
  Problem: Education: Goal: Ability to describe self-care measures that may prevent or decrease complications (Diabetes Survival Skills Education) will improve Outcome: Progressing   Problem: Fluid Volume: Goal: Ability to maintain a balanced intake and output will improve Outcome: Progressing   Problem: Health Behavior/Discharge Planning: Goal: Ability to manage health-related needs will improve Outcome: Progressing   Problem: Metabolic: Goal: Ability to maintain appropriate glucose levels will improve Outcome: Progressing   Problem: Tissue Perfusion: Goal: Adequacy of tissue perfusion will improve Outcome: Progressing   Problem: Education: Goal: Knowledge of General Education information will improve Description: Including pain rating scale, medication(s)/side effects and non-pharmacologic comfort measures Outcome: Progressing

## 2024-02-29 NOTE — Plan of Care (Signed)
  Problem: Education: Goal: Ability to describe self-care measures that may prevent or decrease complications (Diabetes Survival Skills Education) will improve Outcome: Progressing Goal: Individualized Educational Video(s) Outcome: Progressing   Problem: Coping: Goal: Ability to adjust to condition or change in health will improve Outcome: Progressing   Problem: Fluid Volume: Goal: Ability to maintain a balanced intake and output will improve Outcome: Progressing   Problem: Health Behavior/Discharge Planning: Goal: Ability to identify and utilize available resources and services will improve Outcome: Progressing Goal: Ability to manage health-related needs will improve Outcome: Progressing   Problem: Metabolic: Goal: Ability to maintain appropriate glucose levels will improve Outcome: Progressing   Problem: Nutritional: Goal: Maintenance of adequate nutrition will improve Outcome: Progressing Goal: Progress toward achieving an optimal weight will improve Outcome: Progressing   Problem: Skin Integrity: Goal: Risk for impaired skin integrity will decrease Outcome: Progressing   Problem: Tissue Perfusion: Goal: Adequacy of tissue perfusion will improve Outcome: Progressing   Problem: Education: Goal: Knowledge of General Education information will improve Description: Including pain rating scale, medication(s)/side effects and non-pharmacologic comfort measures Outcome: Progressing   Problem: Health Behavior/Discharge Planning: Goal: Ability to manage health-related needs will improve Outcome: Progressing   Problem: Clinical Measurements: Goal: Ability to maintain clinical measurements within normal limits will improve Outcome: Progressing Goal: Will remain free from infection Outcome: Progressing Goal: Diagnostic test results will improve Outcome: Progressing Goal: Respiratory complications will improve Outcome: Progressing Goal: Cardiovascular complication will  be avoided Outcome: Progressing   Problem: Activity: Goal: Risk for activity intolerance will decrease Outcome: Progressing   Problem: Nutrition: Goal: Adequate nutrition will be maintained Outcome: Progressing   Problem: Coping: Goal: Level of anxiety will decrease Outcome: Progressing   Problem: Elimination: Goal: Will not experience complications related to bowel motility Outcome: Progressing Goal: Will not experience complications related to urinary retention Outcome: Progressing   Problem: Pain Managment: Goal: General experience of comfort will improve and/or be controlled Outcome: Progressing   Problem: Safety: Goal: Ability to remain free from injury will improve Outcome: Progressing   Problem: Skin Integrity: Goal: Risk for impaired skin integrity will decrease Outcome: Progressing   Problem: Education: Goal: Ability to describe self-care measures that may prevent or decrease complications (Diabetes Survival Skills Education) will improve Outcome: Progressing Goal: Individualized Educational Video(s) Outcome: Progressing   Problem: Cardiac: Goal: Ability to maintain an adequate cardiac output will improve Outcome: Progressing   Problem: Health Behavior/Discharge Planning: Goal: Ability to identify and utilize available resources and services will improve Outcome: Progressing Goal: Ability to manage health-related needs will improve Outcome: Progressing   Problem: Fluid Volume: Goal: Ability to achieve a balanced intake and output will improve Outcome: Progressing   Problem: Metabolic: Goal: Ability to maintain appropriate glucose levels will improve Outcome: Progressing   Problem: Nutritional: Goal: Maintenance of adequate nutrition will improve Outcome: Progressing Goal: Maintenance of adequate weight for body size and type will improve Outcome: Progressing   Problem: Respiratory: Goal: Will regain and/or maintain adequate  ventilation Outcome: Progressing   Problem: Urinary Elimination: Goal: Ability to achieve and maintain adequate renal perfusion and functioning will improve Outcome: Progressing

## 2024-03-01 DIAGNOSIS — G9341 Metabolic encephalopathy: Secondary | ICD-10-CM | POA: Diagnosis not present

## 2024-03-01 DIAGNOSIS — W1830XA Fall on same level, unspecified, initial encounter: Secondary | ICD-10-CM | POA: Diagnosis not present

## 2024-03-01 DIAGNOSIS — R531 Weakness: Secondary | ICD-10-CM | POA: Diagnosis not present

## 2024-03-01 DIAGNOSIS — E1169 Type 2 diabetes mellitus with other specified complication: Secondary | ICD-10-CM | POA: Diagnosis not present

## 2024-03-01 DIAGNOSIS — M79605 Pain in left leg: Secondary | ICD-10-CM | POA: Diagnosis not present

## 2024-03-01 DIAGNOSIS — Z9181 History of falling: Secondary | ICD-10-CM | POA: Diagnosis not present

## 2024-03-01 DIAGNOSIS — L22 Diaper dermatitis: Secondary | ICD-10-CM | POA: Diagnosis not present

## 2024-03-01 DIAGNOSIS — Z23 Encounter for immunization: Secondary | ICD-10-CM | POA: Diagnosis not present

## 2024-03-01 DIAGNOSIS — E0810 Diabetes mellitus due to underlying condition with ketoacidosis without coma: Secondary | ICD-10-CM | POA: Diagnosis not present

## 2024-03-01 DIAGNOSIS — E785 Hyperlipidemia, unspecified: Secondary | ICD-10-CM | POA: Diagnosis not present

## 2024-03-01 DIAGNOSIS — G8911 Acute pain due to trauma: Secondary | ICD-10-CM | POA: Diagnosis not present

## 2024-03-01 DIAGNOSIS — S12111A Posterior displaced Type II dens fracture, initial encounter for closed fracture: Secondary | ICD-10-CM | POA: Diagnosis not present

## 2024-03-01 DIAGNOSIS — Z7401 Bed confinement status: Secondary | ICD-10-CM | POA: Diagnosis not present

## 2024-03-01 DIAGNOSIS — Y95 Nosocomial condition: Secondary | ICD-10-CM | POA: Diagnosis not present

## 2024-03-01 DIAGNOSIS — E1110 Type 2 diabetes mellitus with ketoacidosis without coma: Secondary | ICD-10-CM | POA: Diagnosis not present

## 2024-03-01 DIAGNOSIS — J189 Pneumonia, unspecified organism: Secondary | ICD-10-CM | POA: Diagnosis not present

## 2024-03-01 DIAGNOSIS — H353134 Nonexudative age-related macular degeneration, bilateral, advanced atrophic with subfoveal involvement: Secondary | ICD-10-CM | POA: Diagnosis not present

## 2024-03-01 DIAGNOSIS — D649 Anemia, unspecified: Secondary | ICD-10-CM | POA: Diagnosis not present

## 2024-03-01 DIAGNOSIS — Y92009 Unspecified place in unspecified non-institutional (private) residence as the place of occurrence of the external cause: Secondary | ICD-10-CM | POA: Diagnosis not present

## 2024-03-01 DIAGNOSIS — S0081XD Abrasion of other part of head, subsequent encounter: Secondary | ICD-10-CM | POA: Diagnosis not present

## 2024-03-01 DIAGNOSIS — S12120D Other displaced dens fracture, subsequent encounter for fracture with routine healing: Secondary | ICD-10-CM | POA: Diagnosis not present

## 2024-03-01 DIAGNOSIS — E118 Type 2 diabetes mellitus with unspecified complications: Secondary | ICD-10-CM | POA: Diagnosis not present

## 2024-03-01 DIAGNOSIS — I959 Hypotension, unspecified: Secondary | ICD-10-CM | POA: Diagnosis not present

## 2024-03-01 DIAGNOSIS — S12120S Other displaced dens fracture, sequela: Secondary | ICD-10-CM | POA: Diagnosis not present

## 2024-03-01 DIAGNOSIS — F330 Major depressive disorder, recurrent, mild: Secondary | ICD-10-CM | POA: Diagnosis not present

## 2024-03-01 DIAGNOSIS — A419 Sepsis, unspecified organism: Secondary | ICD-10-CM | POA: Diagnosis not present

## 2024-03-01 DIAGNOSIS — H401110 Primary open-angle glaucoma, right eye, stage unspecified: Secondary | ICD-10-CM | POA: Diagnosis not present

## 2024-03-01 DIAGNOSIS — S12112D Nondisplaced Type II dens fracture, subsequent encounter for fracture with routine healing: Secondary | ICD-10-CM | POA: Diagnosis not present

## 2024-03-01 DIAGNOSIS — I2510 Atherosclerotic heart disease of native coronary artery without angina pectoris: Secondary | ICD-10-CM | POA: Diagnosis not present

## 2024-03-01 DIAGNOSIS — I480 Paroxysmal atrial fibrillation: Secondary | ICD-10-CM | POA: Diagnosis not present

## 2024-03-01 LAB — CBC
HCT: 26.2 % — ABNORMAL LOW (ref 39.0–52.0)
Hemoglobin: 8.8 g/dL — ABNORMAL LOW (ref 13.0–17.0)
MCH: 29.9 pg (ref 26.0–34.0)
MCHC: 33.6 g/dL (ref 30.0–36.0)
MCV: 89.1 fL (ref 80.0–100.0)
Platelets: 215 K/uL (ref 150–400)
RBC: 2.94 MIL/uL — ABNORMAL LOW (ref 4.22–5.81)
RDW: 14.5 % (ref 11.5–15.5)
WBC: 6.9 K/uL (ref 4.0–10.5)
nRBC: 0 % (ref 0.0–0.2)

## 2024-03-01 LAB — BASIC METABOLIC PANEL WITH GFR
Anion gap: 11 (ref 5–15)
BUN: 11 mg/dL (ref 8–23)
CO2: 21 mmol/L — ABNORMAL LOW (ref 22–32)
Calcium: 7.3 mg/dL — ABNORMAL LOW (ref 8.9–10.3)
Chloride: 105 mmol/L (ref 98–111)
Creatinine, Ser: 0.87 mg/dL (ref 0.61–1.24)
GFR, Estimated: >60 mL/min (ref >60)
Glucose, Bld: 182 mg/dL — ABNORMAL HIGH (ref 70–99)
Potassium: 3.8 mmol/L (ref 3.5–5.1)
Sodium: 137 mmol/L (ref 135–145)

## 2024-03-01 LAB — GLUCOSE, CAPILLARY
Glucose-Capillary: 142 mg/dL — ABNORMAL HIGH (ref 70–99)
Glucose-Capillary: 174 mg/dL — ABNORMAL HIGH (ref 70–99)

## 2024-03-01 LAB — MAGNESIUM: Magnesium: 1.7 mg/dL (ref 1.7–2.4)

## 2024-03-01 MED ORDER — CLOPIDOGREL BISULFATE 75 MG PO TABS
75.0000 mg | ORAL_TABLET | Freq: Every day | ORAL | Status: DC
  Administered 2024-03-01: 75 mg via ORAL
  Filled 2024-03-01: qty 1

## 2024-03-01 MED ORDER — NYSTATIN 100000 UNIT/GM EX POWD: Freq: Three times a day (TID) | CUTANEOUS | 0 refills | Status: DC

## 2024-03-01 MED ORDER — INSULIN ASPART 100 UNIT/ML IJ SOLN: 0.0000 [IU] | Freq: Three times a day (TID) | INTRAMUSCULAR | 2 refills | Status: DC

## 2024-03-01 MED ORDER — CLOPIDOGREL BISULFATE 75 MG PO TABS: 75.0000 mg | ORAL_TABLET | Freq: Every day | ORAL | 0 refills | Status: DC

## 2024-03-01 MED ORDER — INSULIN GLARGINE 100 UNIT/ML ~~LOC~~ SOLN: 15.0000 [IU] | Freq: Every day | SUBCUTANEOUS | 2 refills | Status: DC

## 2024-03-01 NOTE — Plan of Care (Signed)
   Problem: Education: Goal: Knowledge of General Education information will improve Description: Including pain rating scale, medication(s)/side effects and non-pharmacologic comfort measures Outcome: Progressing   Problem: Clinical Measurements: Goal: Will remain free from infection Outcome: Progressing   Problem: Clinical Measurements: Goal: Diagnostic test results will improve Outcome: Progressing

## 2024-03-01 NOTE — Progress Notes (Addendum)
 Nurse called [facility] to give report, talked to [facility] at front desk and left my call back number because the receiving nurse was busy and could not take report. Verdel LOISE Shams, RN  [phone]: Patient report given to nurse [place] at [facility]. Verdel LOISE Shams, RN

## 2024-03-01 NOTE — TOC Transition Note (Signed)
 Transition of Care [facility]) - Discharge Note   Patient Details  Name: Matthew Calderon MRN: [id number] Date of Birth: 04-Jun-1933  Transition of Care [facility]) CM/SW Contact:  Luise JAYSON Pan, LCSWA Phone Number: 03/01/2024, 10:50 AM   Clinical Narrative:   Patient will DC to: [facility] Anticipated DC date: 03/01/24  Family notified: BROWN,SALLY AND KEDAR (Daughter)  Transport by: [facility]   Per MD patient ready for DC to [facility]. RN to call report prior to discharge [phone]; room 603a.). RN, patient, patient's family, and facility notified of DC. Discharge Summary and FL2 sent to facility. DC packet on chart. Ambulance transport requested for patient 10:51 AM.   CSW will sign off for now as social work intervention is no longer needed. Please consult us  again if new needs arise.  Final next level of care: Skilled Nursing Facility Barriers to Discharge: Barriers Resolved   Patient Goals and CMS Choice Patient states their goals for this hospitalization and ongoing recovery are:: To return to STR          Discharge Placement PASRR number recieved: 02/19/24            Patient chooses bed at: [facility] Patient to be transferred to facility by: [facility] Name of family member notified: BROWN,SALLY AND KEDAR (Daughter) Patient and family notified of of transfer: 03/01/24  Discharge Plan and Services Additional resources added to the After Visit Summary for   In-house Referral: Clinical Social Work                                   Social Drivers of Health (SDOH) Interventions SDOH Screenings   Food Insecurity: No Food Insecurity (08/12/2023)  Housing: Low Risk  (08/12/2023)  Transportation Needs: No Transportation Needs (08/12/2023)  Utilities: Not At Risk (08/12/2023)  Social Connections: Unknown (08/12/2023)  Tobacco Use: Medium Risk (02/19/2024)     Readmission Risk Interventions     No data to display

## 2024-03-01 NOTE — Care Management Important Message (Signed)
 Important Message  Patient Details  Name: Matthew Calderon MRN: [id number] Date of Birth: Jul 27, 1932   Important Message Given:  Yes - Medicare IM     Matthew Calderon 03/01/2024, 12:02 PM

## 2024-03-01 NOTE — Discharge Summary (Signed)
 Physician Discharge Summary  Matthew Calderon FMW:[id number] DOB: 11/04/1932 DOA: 02/19/2024  PCP: Caleen Dirks, MD  Admit date: 02/19/2024 Discharge date: 03/01/2024  Disposition: SNF  Recommendations for Outpatient Follow-up:  Follow up with PCP in 1 week with repeat CBC/BMP Follow up in ED if symptoms worsen or new appear Follow-up with cardiology for congestive heart failure/CAD/Afib Follow-up with neurosurgery regarding C2 dens fracture   Discharge Condition: Stable CODE STATUS: Full Diet recommendation: Heart healthy/Diabetic  Brief/Interim Summary:  89 year old with history of CAD, CHF, DM2, HTN, HLD, OSA, disorder, recurrent falls comes to the ED from [facility] came to the ED on 8/27 from a ground-level fall found to have type II dens fracture seen by neurosurgery and placed in hard collar with plans to follow-up outpatient in 2 weeks.  But now brought back to the hospital due to concerns of fever and change in mental status.  COVID/RSV/flu were negative.  CT chest abdomen pelvis shows concerns of bilateral patchy opacity in lungs, CT head negative for acute pathology, CT cervical spine showing C2 type 2 dens fracture.   Patient admitted secondary to acute metabolic encephalopathy from sepsis/pneumonia and DKA. Patient initially was in euglycemic DKA secondary Trijardy requiring insulin  drip and was transitioned to subcu but again on 9/1 went back into euglycemic DKA requiring Endo tool.  Patient again went into DKA on 9/3 Transitioned off IV insulin  to subcu 9/5 and since then continued to do well with improvement in encephalopathy.  He has completed antibiotic therapy in the hospital for pneumonia.  He is oral antidiabetics have been discontinued and he is placed on insulin  therapy.  Hospital course also notable for intermittent A-fib RVR.  He is not anticoagulated due to fall risk. He is discharged to SNF  Details of hospitalization as below.        Assessment/plan: Euglycemic diabetic ketoacidosis Uncontrolled DM 2 secondary to hyperglycemia Severe acidosis and euglycemic DKA suspected secondary to Trijardy.  Patient required several days of IV insulin  due to recurrence of DKA when off IV insulin . Now maintained on Lantus  as well as sliding scale as part. Prior to admission oral hypoglycemics have been discontinued. -A1c 10.1.     Dysphagia - ST evaluation completed, patient is placed on modified diet.  Sepsis, multifocal pneumonia on admission -Concerns of multifocal pneumonia seen on CT chest - Completed 3 days of azithromycin  and 7 days of Rocephin  - COVID/flu/RSV negative   Recent ground-level fall complicated by type II dens fracture Repeat CT C-spine demonstrates slightly more displaced type II dens with 2 mm distraction. Clinically neurologically intact in all extremities Seen by neurosurgery, Dr. [name].  Hard c-collar adjusted to correct fitting.  Outpatient follow-up.   Encephalopathy, metabolic suspected setting of underlying infection Dementia CT head with no acute intracranial abnormality.  Likely in the setting of infection and DKA  B12 normal - TSH and ammonia are normal   Atrial fibrillation with RVR - Likely from undergoing stress briefly requiring amiodarone  drip earlier in the hospitalization. Again rapid A-fib 9/3 night, converted with IV metoprolol  On 9/4, added Cardizem  30 mg every 6 hours p.o. this will be discontinued due to softer blood pressure. continue Coreg  6.25 bid  discussed with daughter, long-term anticoagulation deferred due to risk of fall    Elevated troponin Coronary artery disease -Initially significantly elevated troponins CT showing evidence of 4 vessel disease. Suspected demand ischemia.  Echocardiogram shows preserved EF, grade 3 aortic root plaque.  Completed 48 hours of heparin  drip.  Aspirin   has been changed to Plavix .   Acute kidney injury resolved Creatinine 1.5 on admission  has normalized.     Isolated elevation in T Bili  -Trend LFT   Incidental findings: Aortic atherosclerosis, 4 vessel CAD, aortic valve calcification Cholelithiasis, diverticulosis, small hiatal hernia, prostamegaly   CHF with EF 55% Continue Coreg .  Hold Entresto  due to BP, continue Lasix .  Follow-up with cardiology outpatient.   Hypertension As above Hyperlipidemia See CAD   OSA  Not on CPAP   Mood disorder  Continue home sertraline    Groin rash: Continue nystatin  powder   Recurrent falls PT/OT-SNF   Discharge Diagnoses:  Principal Problem:   CAP (community acquired pneumonia) Active Problems:   Non-ST elevation (NSTEMI) myocardial infarction (HCC)   Sepsis (HCC)   Atrial fibrillation, rapid (HCC)   Acute metabolic encephalopathy   Myocardial injury   Recurrent falls   Atherosclerosis of aorta (HCC)   Coronary artery calcification   Heart failure with improved ejection fraction (HFimpEF) (HCC)   Multifocal pneumonia   Atrial fibrillation with RVR (HCC)   Diabetic ketoacidosis without coma associated with type 2 diabetes mellitus [facility])    Discharge Instructions  Discharge Instructions     (HEART FAILURE PATIENTS) Call MD:  Anytime you have any of the following symptoms: 1) 3 pound weight gain in 24 hours or 5 pounds in 1 week 2) shortness of breath, with or without a dry hacking cough 3) swelling in the hands, feet or stomach 4) if you have to sleep on extra pillows at night in order to breathe.   Complete by: As directed    Call MD for:  difficulty breathing, headache or visual disturbances   Complete by: As directed    Call MD for:  persistant nausea and vomiting   Complete by: As directed    Diet - low sodium heart healthy   Complete by: As directed    Diet Carb Modified   Complete by: As directed    Discharge instructions   Complete by: As directed    1.  Oral antihyperglycemics discontinued due to euglycemic DKA.  Patient is initiated on insulin   Lantus  as well as sliding scale coverage with NovoLog .  Need to monitor fingersticks ACHS. 2.  Nystatin  powder for fungal infection in the groin. 3.  Continue neck collar at all times.  Follow-up with neurosurgery outpatient.   Increase activity slowly   Complete by: As directed       Allergies as of 03/01/2024       Reactions   Alendronate Sodium    Other reaction(s): diarrhea   Penicillin G    Other reaction(s): Unknown   Penicillins Hives   DID THE REACTION INVOLVE: Swelling of the face/tongue/throat, SOB, or low BP? No Sudden or severe rash/hives, skin peeling, or the inside of the mouth or nose? No Did it require medical treatment? No When did it last happen?childhood allergy       If all above answers are "NO", may proceed with cephalosporin use.   Sulfa Antibiotics Hives   Sulfacetamide Sodium    Other reaction(s): Unknown        Medication List     PAUSE taking these medications    Entresto  24-26 MG Wait to take this until your doctor or other care provider tells you to start again. Generic drug: sacubitril -valsartan  Take 1 tablet by mouth 2 (two) times daily.       STOP taking these medications    aspirin  81 MG chewable  tablet   Synjardy XR 25-1000 MG Tb24 Generic drug: Empagliflozin-metFORMIN  HCl ER   Trijardy XR 25-10-998 MG Tb24 Generic drug: Empagliflozin-Linaglip-Metform       TAKE these medications    acetaminophen  500 MG tablet Commonly known as: TYLENOL  Take 650 mg by mouth 2 (two) times daily as needed for mild pain (pain score 1-3) or moderate pain (pain score 4-6).   atorvastatin  40 MG tablet Commonly known as: LIPITOR Take 1 tablet (40 mg total) by mouth daily at 6 PM.   brimonidine  0.2 % ophthalmic solution Commonly known as: ALPHAGAN  Place 1 drop into both eyes 2 (two) times daily.   carvedilol  6.25 MG tablet Commonly known as: COREG  Take 6.25 mg by mouth 2 (two) times daily with a meal.   cetirizine 10 MG tablet Commonly  known as: ZYRTEC Take 10 mg by mouth daily.   cholecalciferol  25 MCG (1000 UNIT) tablet Commonly known as: VITAMIN D3 Take 1,000 Units by mouth daily.   clopidogrel  75 MG tablet Commonly known as: PLAVIX  Take 1 tablet (75 mg total) by mouth daily.   Ferrous Sulfate  Dried 143 (45 Fe) MG Tbcr Take 45 mg by mouth daily.   finasteride  5 MG tablet Commonly known as: PROSCAR  Take 5 mg by mouth every evening.   furosemide  20 MG tablet Commonly known as: LASIX  Take 20 mg by mouth daily as needed for fluid or edema.   guaiFENesin  600 MG 12 hr tablet Commonly known as: MUCINEX  Take 1 tablet (600 mg total) by mouth 2 (two) times daily.   insulin  aspart 100 UNIT/ML injection Commonly known as: novoLOG  Inject 0-9 Units into the skin 3 (three) times daily with meals.   insulin  glargine 100 UNIT/ML injection Commonly known as: LANTUS  Inject 0.15 mLs (15 Units total) into the skin daily. Start taking on: March 02, 2024   mirabegron  ER 50 MG Tb24 tablet Commonly known as: MYRBETRIQ  Take 50 mg by mouth daily.   nystatin  powder Commonly known as: MYCOSTATIN /NYSTOP  Apply topically 3 (three) times daily.   potassium chloride  10 MEQ tablet Commonly known as: KLOR-CON  Take 10 mEq by mouth daily.   PreserVision AREDS 2 Caps Take 1 capsule by mouth 2 (two) times daily.   sertraline  50 MG tablet Commonly known as: ZOLOFT  Take 50 mg by mouth every evening.   tamsulosin  0.4 MG Caps capsule Commonly known as: FLOMAX  Take 0.4 mg by mouth at bedtime.   timolol  0.5 % ophthalmic solution Commonly known as: TIMOPTIC  Place 1 drop into both eyes 2 (two) times daily.        Contact information for follow-up providers     Caleen Dirks, MD Follow up in 1 week(s).   Specialty: Internal Medicine Why: f/u: initiation of insulin  therapy Contact information: [address] [place] [address] [phone]         Louis Shove, MD Follow up in 1 week(s).   Specialty:  Neurosurgery Why: C2 dens fracture Contact information: [address] [place] [address] [phone]              Contact information for after-discharge care     Destination     [facility] .   Service: Skilled Nursing Contact information: [address] [place]  [address] [phone]                    Allergies  Allergen Reactions   Alendronate Sodium     Other reaction(s): diarrhea  Penicillin G     Other reaction(s): Unknown   Penicillins Hives    DID THE REACTION INVOLVE: Swelling of the face/tongue/throat, SOB, or low BP? No Sudden or severe rash/hives, skin peeling, or the inside of the mouth or nose? No Did it require medical treatment? No When did it last happen?childhood allergy       If all above answers are "NO", may proceed with cephalosporin use.    Sulfa Antibiotics Hives   Sulfacetamide Sodium     Other reaction(s): Unknown    Consultations:    Procedures/Studies: DG Swallowing Func-Speech Pathology Result Date: 02/27/2024 Table formatting from the original result was not included. Modified Barium Swallow Study Patient Details Name: RAMIZ TURPIN MRN: [id number] Date of Birth: 1932/07/17 Today's Date: 02/27/2024 HPI/PMH: HPI: Earvin Blazier is an 89 year old male who presented to the ED from [facility] came to the ED on 8/27 from a ground-level fall found to have type II dens fracture seen by neurosurgery and placed in hard collar with plans to follow-up outpatient in 2 weeks.  But now brought back to the hospital 8/28 due to concerns of fever and change in mental status. CT chest 8/28: Bilateral lower lobe patchy airspace opacities. Finding may represent atelectasis with superimposed infection.  COVID/RSV/influenza negative. Pt reporting nonproductive cough and dysphagia in ED.  Pt with history of CAD, CHF, DM2, HTN, HLD, OSA, disorder, recurrent falls. BSE completed on 8/29 and was placed on regular  thin with recommendations for a MBS. Per radiology staff, RN was not able to come with pt at the time of his first schedule MBS. On 9/1, Diet NPO due to Diabetes Ketoacidosis (DKA). Mentation improved and able to complete MBS 9/5 with recs for Reg/Mildly thick liquids. Clinical Impression: Pt presents with a moderate oropharyngeal dysphagia c/b lingual discoordination, delayed swallow intiation, reduced base of tongue retraction, decreased hyolaryngeal excursion, incomplete laryngeal closure and diminished sensation.  These deficits resulted in silent aspiration of thin liquid intermittently during the swallow.  There was no penetration or aspiration of any other consistencies trialed.  Suspect presence of hard cervical collar is impacting swallow function holding pt's head/neck in position during swallow. Pill simulation was completed with nectar thick liquid with no penetration or aspiration observed, but there was retention and backflow of contrast within the esophagus on sweep.  Recommend regular texture diet with nectar thick liquids.  Pt may take medications whole with nectar thick liquids. Pt may have ice chips and sips of unthickened water in between meals, in moderation, after good oral care, when fully awake/alert, with upright positioning and direct supervision. DIGEST Swallow Severity Rating*  Safety: 2  Efficiency: 0  Overall Pharyngeal Swallow Severity: 2 1: mild; 2: moderate; 3: severe; 4: profound *The Dynamic Imaging Grade of Swallowing Toxicity is standardized for the head and neck cancer population, however, demonstrates promising clinical applications across populations to standardize the clinical rating of pharyngeal swallow safety and severity. Factors that may increase risk of adverse event in presence of aspiration Noe & Lianne 2021): Factors that may increase risk of adverse event in presence of aspiration Noe & Lianne 2021): Reduced cognitive function; Limited mobility (2/2  cervical collar) Recommendations/Plan: Swallowing Evaluation Recommendations Swallowing Evaluation Recommendations Recommendations: PO diet PO Diet Recommendation: Regular; Mildly thick liquids (Level 2, nectar thick) Liquid Administration via: Cup; Straw Medication Administration: Whole meds with liquid Supervision: Staff to assist with self-feeding Swallowing strategies  : Slow rate; Small bites/sips Postural changes: Position pt fully upright for meals;  Stay upright 30-60 min after meals Oral care recommendations: Oral care BID (2x/day) Treatment Plan Treatment Plan Treatment recommendations: Therapy as outlined in treatment plan below Follow-up recommendations: -- (ST at next level of care. Recommend repeat MBSS once cervical collar is discontinued) Functional status assessment: Patient has had a recent decline in their functional status and demonstrates the ability to make significant improvements in function in a reasonable and predictable amount of time. Treatment frequency: Min 2x/week Treatment duration: 2 weeks Interventions: Aspiration precaution training; Diet toleration management by SLP; Compensatory techniques; Patient/family education Recommendations Recommendations for follow up therapy are one component of a multi-disciplinary discharge planning process, led by the attending physician.  Recommendations may be updated based on patient status, additional functional criteria and insurance authorization. Assessment: Orofacial Exam: Orofacial Exam Oral Cavity: Oral Hygiene: WFL Oral Cavity - Dentition: Adequate natural dentition Orofacial Anatomy: WFL Oral Motor/Sensory Function: -- (See BSE) Anatomy: Anatomy: -- (Presence of cervical collar) Boluses Administered: Boluses Administered Boluses Administered: Thin liquids (Level 0); Mildly thick liquids (Level 2, nectar thick); Moderately thick liquids (Level 3, honey thick); Puree; Solid  Oral Impairment Domain: Oral Impairment Domain Lip Closure: No  labial escape Tongue control during bolus hold: Posterior escape of less than half of bolus Bolus preparation/mastication: Disorganized chewing/mashing with solid pieces of bolus unchewed Bolus transport/lingual motion: Repetitive/disorganized tongue motion Oral residue: Trace residue lining oral structures Location of oral residue : Tongue Initiation of pharyngeal swallow : Pyriform sinuses  Pharyngeal Impairment Domain: Pharyngeal Impairment Domain Soft palate elevation: No bolus between soft palate (SP)/pharyngeal wall (PW) Laryngeal elevation: Partial superior movement of thyroid  cartilage/partial approximation of arytenoids to epiglottic petiole Anterior hyoid excursion: Partial anterior movement Epiglottic movement: Complete inversion Laryngeal vestibule closure: Incomplete, narrow column air/contrast in laryngeal vestibule Pharyngeal stripping wave : Present - diminished Pharyngeal contraction (A/P view only): N/A Pharyngoesophageal segment opening: Complete distension and complete duration, no obstruction of flow Tongue base retraction: Trace column of contrast or air between tongue base and PPW Pharyngeal residue: Trace residue within or on pharyngeal structures Location of pharyngeal residue: Valleculae; Pyriform sinuses  Esophageal Impairment Domain: Esophageal Impairment Domain Esophageal clearance upright position: Esophageal retention with retrograde flow below pharyngoesophageal segment (PES) Pill: Pill Consistency administered: Mildly thick liquids (Level 2, nectar thick) Mildly thick liquids (Level 2, nectar thick): WFL Penetration/Aspiration Scale Score: Penetration/Aspiration Scale Score 1.  Material does not enter airway: Mildly thick liquids (Level 2, nectar thick); Moderately thick liquids (Level 3, honey thick); Puree; Solid; Pill 8.  Material enters airway, passes BELOW cords without attempt by patient to eject out (silent aspiration) : Thin liquids (Level 0) Compensatory Strategies:  Compensatory Strategies Compensatory strategies: Yes Straw: Ineffective Ineffective Straw: Thin liquid (Level 0) Chin tuck: -- (Unable 2/2 presence of hard cervical collar) Other(comment): Ineffective (Cup sips) Ineffective Other(comment): Thin liquid (Level 0)   General Information: Caregiver present: No  Diet Prior to this Study: NPO   No data recorded  No data recorded  Supplemental O2: Nasal cannula   History of Recent Intubation: No  Behavior/Cognition: Alert; Cooperative; Pleasant mood Self-Feeding Abilities: Able to self-feed; Needs assist with self-feeding Baseline vocal quality/speech: Normal Volitional Cough: Able to elicit No data recorded Exam Limitations: No limitations Goal Planning: Prognosis for improved oropharyngeal function: Good No data recorded No data recorded No data recorded Consulted and agree with results and recommendations: Patient; Nurse; Physician Pain: Pain Assessment Faces Pain Scale: 0 Breathing: 0 Negative Vocalization: 0 Facial Expression: 0 Body Language: 0 Consolability: 0 PAINAD Score:  0 End of Session: Start Time:SLP Start Time (ACUTE ONLY): 1027 Stop Time: SLP Stop Time (ACUTE ONLY): 1044 Time Calculation:SLP Time Calculation (min) (ACUTE ONLY): 17 min Charges: SLP Evaluations $ SLP Speech Visit: 1 Visit SLP Evaluations $MBS Swallow: 1 Procedure $Swallowing Treatment: 1 Procedure SLP visit diagnosis: SLP Visit Diagnosis: Dysphagia, oropharyngeal phase (R13.12) Past Medical History: Past Medical History: Diagnosis Date  Anemia   takes iron pill  Anxiety   Arthritis   Depression   Diabetes mellitus without complication (HCC)   GERD (gastroesophageal reflux disease)   pepto  H/O hiatal hernia   Pneumonia   4 years ago  PONV (postoperative nausea and vomiting) 05/14/2019  Post-nasal drip   Prostate hyperplasia, benign localized, without urinary obstruction   Seasonal allergies   Sleep apnea   no CPAP Past Surgical History: Past Surgical History: Procedure Laterality Date  BACK  SURGERY  1995  discectomy  COLONOSCOPY W/ BIOPSIES AND POLYPECTOMY    EYE SURGERY Bilateral   cataracts  EYE SURGERY Bilateral   glaucoma shunts  JOINT REPLACEMENT Left 1992  knee  JOINT REPLACEMENT Right 1996  knee  JOINT REPLACEMENT Right 2009  hip  JOINT REPLACEMENT Left 2005  hip  KNEE ARTHROSCOPY Left 1981  KNEE ARTHROSCOPY WITH PATELLA RECONSTRUCTION Right 2010  KNEE SURGERY Bilateral 1954  knee surgery and placed in a cast for 6 weeks  KNEE SURGERY  1961  cartilage removed  LEG SURGERY Bilateral 1974, 1975  straighten legs  SHOULDER OPEN ROTATOR CUFF REPAIR Right 2010  TONSILLECTOMY    TOTAL HIP ARTHROPLASTY Right 01/13/2014  Procedure: TOTAL HIP ARTHROPLASTY ANTERIOR APPROACH;  Surgeon: Maude KANDICE Herald, MD;  Location: [facility] OR;  Service: Orthopedics;  Laterality: Right; Anette FORBES Grippe, MA, CCC-SLP Acute Rehabilitation Services Office: [phone] 02/27/2024, 11:24 AM  US  EKG SITE RITE Result Date: 02/23/2024 If Site Rite image not attached, placement could not be confirmed due to current cardiac rhythm.  ECHOCARDIOGRAM COMPLETE Result Date: 02/20/2024    ECHOCARDIOGRAM REPORT   Patient Name:   Matthew Calderon Date of Exam: 02/20/2024 Medical Rec #:  [id number]      Height:       59.0 in Accession #:    [id number]     Weight:       154.0 lb Date of Birth:  04-29-1933      BSA:          1.650 m Patient Age:    89 years       BP:           107/60 mmHg Patient Gender: M              HR:           87 bpm. Exam Location:  Inpatient Procedure: 2D Echo, Color Doppler and Cardiac Doppler (Both Spectral and Color            Flow Doppler were utilized during procedure). Indications:    Cardiomyopathy I42.9  History:        Patient has prior history of Echocardiogram examinations, most                 recent 01/12/2020. Cardiomyopathy; Risk Factors:Diabetes.  Sonographer:    BERNARDA ROCKS Referring Phys: ANKIT C AMIN IMPRESSIONS  1. Left ventricular ejection fraction, by estimation, is 60 to 65%. The left ventricle has  normal function. The left ventricle has no regional wall motion abnormalities. Left ventricular diastolic parameters are consistent with Grade I diastolic dysfunction (impaired  relaxation).  2. Right ventricular systolic function is normal. The right ventricular size is normal. Tricuspid regurgitation signal is inadequate for assessing PA pressure.  3. The mitral valve is degenerative. Trivial mitral valve regurgitation.  4. The aortic valve is tricuspid. Aortic valve regurgitation is trivial. Aortic valve sclerosis/calcification is present, without any evidence of aortic stenosis.  5. There is Moderate (Grade III) calcified plaque involving the aortic root.  6. The inferior vena cava is normal in size with <50% respiratory variability, suggesting right atrial pressure of 8 mmHg. Comparison(s): Changes from prior study are noted. 01/12/2020: LVEF 55-60%. FINDINGS  Left Ventricle: Left ventricular ejection fraction, by estimation, is 60 to 65%. The left ventricle has normal function. The left ventricle has no regional wall motion abnormalities. The left ventricular internal cavity size was normal in size. There is  no left ventricular hypertrophy. Left ventricular diastolic parameters are consistent with Grade I diastolic dysfunction (impaired relaxation). Indeterminate filling pressures. Right Ventricle: The right ventricular size is normal. No increase in right ventricular wall thickness. Right ventricular systolic function is normal. Tricuspid regurgitation signal is inadequate for assessing PA pressure. Left Atrium: Left atrial size was normal in size. Right Atrium: Right atrial size was normal in size. Pericardium: There is no evidence of pericardial effusion. Presence of epicardial fat layer. Mitral Valve: The mitral valve is degenerative in appearance. Mild mitral annular calcification. Trivial mitral valve regurgitation. MV peak gradient, 4.9 mmHg. The mean mitral valve gradient is 2.0 mmHg. Tricuspid Valve:  The tricuspid valve is grossly normal. Tricuspid valve regurgitation is trivial. Aortic Valve: The aortic valve is tricuspid. Aortic valve regurgitation is trivial. Aortic valve sclerosis/calcification is present, without any evidence of aortic stenosis. Aortic valve mean gradient measures 5.0 mmHg. Aortic valve peak gradient measures 10.4 mmHg. Aortic valve area, by VTI measures 2.17 cm. Pulmonic Valve: The pulmonic valve was grossly normal. Pulmonic valve regurgitation is trivial. Aorta: The aortic root and ascending aorta are structurally normal, with no evidence of dilitation. There is moderate (Grade III) calcified plaque involving the aortic root. Venous: The inferior vena cava is normal in size with less than 50% respiratory variability, suggesting right atrial pressure of 8 mmHg. IAS/Shunts: No atrial level shunt detected by color flow Doppler.  LEFT VENTRICLE PLAX 2D LVIDd:         4.40 cm      Diastology LVIDs:         3.10 cm      LV e' medial:    7.94 cm/s LV PW:         0.90 cm      LV E/e' medial:  12.8 LV IVS:        1.00 cm      LV e' lateral:   11.40 cm/s LVOT diam:     2.00 cm      LV E/e' lateral: 8.9 LV SV:         63 LV SV Index:   38 LVOT Area:     3.14 cm  LV Volumes (MOD) LV vol d, MOD A2C: 79.1 ml LV vol d, MOD A4C: 140.0 ml LV vol s, MOD A2C: 26.7 ml LV vol s, MOD A4C: 52.0 ml LV SV MOD A2C:     52.4 ml LV SV MOD A4C:     140.0 ml LV SV MOD BP:      72.0 ml RIGHT VENTRICLE RV Basal diam:  3.60 cm RV S prime:     14.10 cm/s TAPSE (M-mode):  1.9 cm LEFT ATRIUM             Index        RIGHT ATRIUM           Index LA diam:        3.70 cm 2.24 cm/m   RA Area:     10.90 cm LA Vol (A2C):   29.0 ml 17.57 ml/m  RA Volume:   20.10 ml  12.18 ml/m LA Vol (A4C):   34.2 ml 20.72 ml/m LA Biplane Vol: 31.6 ml 19.15 ml/m  AORTIC VALVE                     PULMONIC VALVE AV Area (Vmax):    2.01 cm      PV Vmax:       0.72 m/s AV Area (Vmean):   1.90 cm      PV Peak grad:  2.1 mmHg AV Area (VTI):      2.17 cm AV Vmax:           161.00 cm/s AV Vmean:          105.000 cm/s AV VTI:            0.290 m AV Peak Grad:      10.4 mmHg AV Mean Grad:      5.0 mmHg LVOT Vmax:         103.00 cm/s LVOT Vmean:        63.400 cm/s LVOT VTI:          0.200 m LVOT/AV VTI ratio: 0.69  AORTA Ao Root diam: 3.30 cm Ao Asc diam:  3.60 cm MITRAL VALVE MV Area (PHT): 2.95 cm     SHUNTS MV Area VTI:   1.94 cm     Systemic VTI:  0.20 m MV Peak grad:  4.9 mmHg     Systemic Diam: 2.00 cm MV Mean grad:  2.0 mmHg MV Vmax:       1.11 m/s MV Vmean:      73.4 cm/s MV Decel Time: 257 msec MV E velocity: 102.00 cm/s MV A velocity: 111.00 cm/s MV E/A ratio:  0.92 Vinie Maxcy MD Electronically signed by Vinie Maxcy MD Signature Date/Time: 02/20/2024/3:20:49 PM    Final    CT CHEST ABDOMEN PELVIS W CONTRAST Result Date: 02/19/2024 CLINICAL DATA:  Sepsis Head trauma, recent fall w/ type 2 dens fx, sepsis EXAM: CT CHEST, ABDOMEN, AND PELVIS WITH CONTRAST TECHNIQUE: Multidetector CT imaging of the chest, abdomen and pelvis was performed following the standard protocol during bolus administration of intravenous contrast. RADIATION DOSE REDUCTION: This exam was performed according to the departmental dose-optimization program which includes automated exposure control, adjustment of the mA and/or kV according to patient size and/or use of iterative reconstruction technique. CONTRAST:  75mL OMNIPAQUE  IOHEXOL  350 MG/ML SOLN COMPARISON:  None Available. FINDINGS: CHEST: Cardiovascular: No aortic injury. The thoracic aorta is normal in caliber. The heart is normal in size. No significant pericardial effusion. Four-vessel coronary artery calcification. Aortic valve leaflet calcification. Moderate severe atherosclerotic plaque. The main pulmonary artery is normal in caliber. No central pulmonary embolus. Mediastinum/Nodes: No pneumomediastinum. No mediastinal hematoma. The esophagus is unremarkable.  Small hiatal hernia. The thyroid  is unremarkable. The  central airways are patent. No mediastinal, hilar, or axillary lymphadenopathy. Lungs/Pleura: Bilateral lower lobe patchy airspace opacities. No pulmonary nodule. No pulmonary mass. No pulmonary contusion or laceration. No pneumatocele formation. No pleural effusion. No pneumothorax. No hemothorax. Musculoskeletal/Chest wall: No  chest wall mass. Old healed right rib fracture. No acute rib or sternal fracture. No spinal fracture. Severe left shoulder degenerative changes. ABDOMEN / PELVIS: Hepatobiliary: Not enlarged. No focal lesion. No laceration or subcapsular hematoma. Calcified gallstones within the gallbladder lumen. No gallbladder wall thickening or pericholecystic fluid. No biliary ductal dilatation. Pancreas: Diffusely atrophic. Otherwise normal pancreatic contour. No main pancreatic duct dilatation. Spleen: Not enlarged. No focal lesion. No laceration, subcapsular hematoma, or vascular injury. Splenule noted. Adrenals/Urinary Tract: No nodularity bilaterally. Bilateral kidneys enhance symmetrically. No hydronephrosis. No contusion, laceration, or subcapsular hematoma. No injury to the vascular structures or collecting systems. No hydroureter. The urinary bladder is unremarkable. On delayed imaging, there is no urothelial wall thickening and there are no filling defects in the opacified portions of the bilateral collecting systems or ureters. Stomach/Bowel: No small or large bowel wall thickening or dilatation. Colonic diverticulosis. The appendix is unremarkable. Vasculature/Lymphatics: No abdominal aorta or iliac aneurysm. No active contrast extravasation or pseudoaneurysm. No abdominal, pelvic, inguinal lymphadenopathy. Reproductive: Prostate is enlarged measuring up to 5 cm. Other: No simple free fluid ascites. No pneumoperitoneum. No hemoperitoneum. No mesenteric hematoma identified. No organized fluid collection. Musculoskeletal: No significant soft tissue hematoma. No acute pelvic fracture. No spinal  fracture. Multilevel severe degenerative changes of the spine. Mild retrolisthesis of L5 on S1. Total bilateral hip arthroplasty partially visualized. Other ports and devices: None. IMPRESSION: 1. Bilateral lower lobe patchy airspace opacities. Finding may represent atelectasis with superimposed infection. 2. No acute intrathoracic, intra-abdominal, intrapelvic traumatic injury. 3. No acute fracture or traumatic malalignment of the thoracic or lumbar spine. 4. Aortic Atherosclerosis (ICD10-I70.0) including four-vessel coronary artery and aortic valve leaflet calcifications-correlate for aortic stenosis. Cholelithiasis with no CT evidence of acute cholecystitis. Colonic diverticulosis with no acute diverticulitis. Small hiatal hernia. Prostatomegaly. Electronically Signed   By: Morgane  Naveau M.D.   On: 02/19/2024 23:09   CT Head Wo Contrast Result Date: 02/19/2024 CLINICAL DATA:  Head trauma, minor (Age >= 65y); recent fall w type 2 dens fx EXAM: CT HEAD WITHOUT CONTRAST CT CERVICAL SPINE WITHOUT CONTRAST TECHNIQUE: Multidetector CT imaging of the head and cervical spine was performed following the standard protocol without intravenous contrast. Multiplanar CT image reconstructions of the cervical spine were also generated. RADIATION DOSE REDUCTION: This exam was performed according to the departmental dose-optimization program which includes automated exposure control, adjustment of the mA and/or kV according to patient size and/or use of iterative reconstruction technique. COMPARISON:  CT head and C-spine 02/18/2024 FINDINGS: CT HEAD FINDINGS Brain: Cerebral ventricle sizes are concordant with the degree of cerebral volume loss. Trace patchy and confluent areas of decreased attenuation are noted throughout the deep and periventricular white matter of the cerebral hemispheres bilaterally, compatible with chronic microvascular ischemic disease. No evidence of large-territorial acute infarction. No parenchymal  hemorrhage. No mass lesion. No extra-axial collection. No mass effect or midline shift. No hydrocephalus. Basilar cisterns are patent. Vascular: No hyperdense vessel. Atherosclerotic calcifications are present within the cavernous internal carotid and vertebral arteries. Skull: No acute fracture or focal lesion. Sinuses/Orbits: Paranasal sinuses and mastoid air cells are clear. Bilateral lens replacement. Otherwise the orbits are unremarkable. Other: None. CT CERVICAL SPINE FINDINGS Alignment: Stable grade 1 anterolisthesis of C5 on C6 and C6 on C7. Skull base and vertebrae: Redemonstration of acute C2 type 2 dens fracture slightly more displaced with a 2 mm distraction. Multilevel severe degenerative changes of the spine with associated moderate to severe osseous neural foraminal stenosis. No severe osseous central  canal stenosis. No aggressive appearing focal osseous lesion or focal pathologic process. Soft tissues and spinal canal: No prevertebral fluid or swelling. No visible canal hematoma. Upper chest: Unremarkable. Other: None. IMPRESSION: 1.  No acute intracranial abnormality. 2. Redemonstration of acute C2 type 2 dens fracture slightly more displaced with a 2 mm distraction. Electronically Signed   By: Morgane  Naveau M.D.   On: 02/19/2024 22:59   CT Cervical Spine Wo Contrast Result Date: 02/19/2024 CLINICAL DATA:  Head trauma, minor (Age >= 65y); recent fall w type 2 dens fx EXAM: CT HEAD WITHOUT CONTRAST CT CERVICAL SPINE WITHOUT CONTRAST TECHNIQUE: Multidetector CT imaging of the head and cervical spine was performed following the standard protocol without intravenous contrast. Multiplanar CT image reconstructions of the cervical spine were also generated. RADIATION DOSE REDUCTION: This exam was performed according to the departmental dose-optimization program which includes automated exposure control, adjustment of the mA and/or kV according to patient size and/or use of iterative reconstruction  technique. COMPARISON:  CT head and C-spine 02/18/2024 FINDINGS: CT HEAD FINDINGS Brain: Cerebral ventricle sizes are concordant with the degree of cerebral volume loss. Trace patchy and confluent areas of decreased attenuation are noted throughout the deep and periventricular white matter of the cerebral hemispheres bilaterally, compatible with chronic microvascular ischemic disease. No evidence of large-territorial acute infarction. No parenchymal hemorrhage. No mass lesion. No extra-axial collection. No mass effect or midline shift. No hydrocephalus. Basilar cisterns are patent. Vascular: No hyperdense vessel. Atherosclerotic calcifications are present within the cavernous internal carotid and vertebral arteries. Skull: No acute fracture or focal lesion. Sinuses/Orbits: Paranasal sinuses and mastoid air cells are clear. Bilateral lens replacement. Otherwise the orbits are unremarkable. Other: None. CT CERVICAL SPINE FINDINGS Alignment: Stable grade 1 anterolisthesis of C5 on C6 and C6 on C7. Skull base and vertebrae: Redemonstration of acute C2 type 2 dens fracture slightly more displaced with a 2 mm distraction. Multilevel severe degenerative changes of the spine with associated moderate to severe osseous neural foraminal stenosis. No severe osseous central canal stenosis. No aggressive appearing focal osseous lesion or focal pathologic process. Soft tissues and spinal canal: No prevertebral fluid or swelling. No visible canal hematoma. Upper chest: Unremarkable. Other: None. IMPRESSION: 1.  No acute intracranial abnormality. 2. Redemonstration of acute C2 type 2 dens fracture slightly more displaced with a 2 mm distraction. Electronically Signed   By: Morgane  Naveau M.D.   On: 02/19/2024 22:59   DG Chest Port 1 View Result Date: 02/19/2024 CLINICAL DATA:  Questionable sepsis - evaluate for abnormality EXAM: PORTABLE CHEST 1 VIEW COMPARISON:  Chest x-ray 02/18/2024 FINDINGS: The heart and mediastinal  contours are unchanged. Atherosclerotic plaque. Low lung volumes. No focal consolidation. No pulmonary edema. No pleural effusion. No pneumothorax. No acute osseous abnormality.  Old healed right rib fracture. IMPRESSION: 1. No active disease. 2.  Aortic Atherosclerosis (ICD10-I70.0). Electronically Signed   By: Morgane  Naveau M.D.   On: 02/19/2024 21:21   CT HEAD WO CONTRAST Result Date: 02/18/2024 EXAM: CT HEAD AND CERVICAL SPINE 02/18/2024 10:42:31 AM TECHNIQUE: CT of the head and cervical spine was performed without the administration of intravenous contrast. Multiplanar reformatted images are provided for review. Automated exposure control, iterative reconstruction, and/or weight based adjustment of the mA/kV was utilized to reduce the radiation dose to as low as reasonably achievable. COMPARISON: CT head and cervical spine 04/09/2021. CLINICAL HISTORY: Polytrauma, blunt. unwitnessed fall. Abrasion and brusing to forehead and chin. Pt c/o neck discomfort; no pain. Hx of DM2  CBG 210. No blood thinners. AAOx4. Unknown LOC. Pt uses walker. FINDINGS: CT HEAD BRAIN AND VENTRICLES: No acute intracranial hemorrhage. No mass effect or midline shift. No abnormal extra-axial fluid collection. Gray-white differentiation is maintained. No hydrocephalus. Cerebral white matter hypodensities, similar to the prior CT and nonspecific but compatible with mild chronic small vessel ischemic disease. 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 Moderate global cerebral atrophy as well as asymmetrically severe volume loss in the right mesial temporal lobe. Calcified atherosclerosis at the skull base. ORBITS: Bilateral cataract extraction. SINUSES AND MASTOIDS: Minimal mucosal thickening in right ethmoid air cells. Clear mastoid air cells. SOFT TISSUES AND SKULL: Small right-sided forehead scalp hematoma. No acute skull fracture. CT CERVICAL SPINE BONES AND ALIGNMENT: Unchanged grade 1 anterolisthesis of C5 on C6 and C6 on  C7. Nondisplaced type 2 dens fracture, new from 2022 but of indeterminate age and potentially nonacute as some of the margins may be partially corticated. Intact posterior elements. DEGENERATIVE CHANGES: Advanced diffuse facet arthrosis. Focally advanced disc degeneration at C7-T1 with mild degeneration elsewhere. SOFT TISSUES: No significant prevertebral soft tissue swelling or fluid. IMPRESSION: 1. No acute intracranial abnormality. 2. Nondisplaced type 2 dens fracture, new from 2022 but of indeterminate age. No significant prevertebral soft tissue swelling or fluid. Electronically signed by: Dasie Hamburg MD 02/18/2024 11:11 AM EDT RP Workstation: [id number]   CT CERVICAL SPINE WO CONTRAST Result Date: 02/18/2024 EXAM: CT HEAD AND CERVICAL SPINE 02/18/2024 10:42:31 AM TECHNIQUE: CT of the head and cervical spine was performed without the administration of intravenous contrast. Multiplanar reformatted images are provided for review. Automated exposure control, iterative reconstruction, and/or weight based adjustment of the mA/kV was utilized to reduce the radiation dose to as low as reasonably achievable. COMPARISON: CT head and cervical spine 04/09/2021. CLINICAL HISTORY: Polytrauma, blunt. unwitnessed fall. Abrasion and brusing to forehead and chin. Pt c/o neck discomfort; no pain. Hx of DM2 CBG 210. No blood thinners. AAOx4. Unknown LOC. Pt uses walker. FINDINGS: CT HEAD BRAIN AND VENTRICLES: No acute intracranial hemorrhage. No mass effect or midline shift. No abnormal extra-axial fluid collection. Gray-white differentiation is maintained. No hydrocephalus. Cerebral white matter hypodensities, similar to the prior CT and nonspecific but compatible with mild chronic small vessel ischemic disease. 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 Moderate global cerebral atrophy as well as asymmetrically severe volume loss in the right mesial temporal lobe. Calcified atherosclerosis at the skull base. ORBITS:  Bilateral cataract extraction. SINUSES AND MASTOIDS: Minimal mucosal thickening in right ethmoid air cells. Clear mastoid air cells. SOFT TISSUES AND SKULL: Small right-sided forehead scalp hematoma. No acute skull fracture. CT CERVICAL SPINE BONES AND ALIGNMENT: Unchanged grade 1 anterolisthesis of C5 on C6 and C6 on C7. Nondisplaced type 2 dens fracture, new from 2022 but of indeterminate age and potentially nonacute as some of the margins may be partially corticated. Intact posterior elements. DEGENERATIVE CHANGES: Advanced diffuse facet arthrosis. Focally advanced disc degeneration at C7-T1 with mild degeneration elsewhere. SOFT TISSUES: No significant prevertebral soft tissue swelling or fluid. IMPRESSION: 1. No acute intracranial abnormality. 2. Nondisplaced type 2 dens fracture, new from 2022 but of indeterminate age. No significant prevertebral soft tissue swelling or fluid. Electronically signed by: Dasie Hamburg MD 02/18/2024 11:11 AM EDT RP Workstation: [id number]   DG Pelvis 1-2 Views Result Date: 02/18/2024 CLINICAL DATA:  [place]. EXAM: PELVIS - 1-2 VIEW COMPARISON:  04/09/2021 FINDINGS: Stable bilateral hip prostheses. No fracture or dislocation. Lower lumbar spine degenerative changes. IMPRESSION: No fracture or dislocation.  Electronically Signed   By: Elspeth Bathe M.D.   On: 02/18/2024 11:11   DG Chest 1 View Result Date: 02/18/2024 CLINICAL DATA:  [place]. EXAM: CHEST  1 VIEW COMPARISON:  08/12/2023 FINDINGS: Again demonstrated is a very poor depth of inspiration. Grossly normal sized heart and clear lungs. No fracture or pneumothorax seen. Mild bilateral shoulder degenerative changes. Lower thoracic spine degenerative changes. IMPRESSION: No acute abnormality. Electronically Signed   By: Elspeth Bathe M.D.   On: 02/18/2024 11:10      Subjective:   Discharge Exam: Vitals:   03/01/24 0445 03/01/24 0735  BP: (!) 114/59 118/66  Pulse: 80 82  Resp: 18 20  Temp: 98.1 F (36.7 C) (!) 97.4 F  (36.3 C)  SpO2: 93% 96%    General: Pt is alert, awake, not in acute distress Cardiovascular: rate controlled, S1/S2 + Respiratory: bilateral decreased breath sounds at bases Abdominal: Soft, NT, ND, bowel sounds + Extremities: no edema, no cyanosis    The results of significant diagnostics from this hospitalization (including imaging, microbiology, ancillary and laboratory) are listed below for reference.     Microbiology: No results found for this or any previous visit (from the past 240 hours).   Labs: BNP (last 3 results) No results for input(s): BNP in the last 8760 hours. Basic Metabolic Panel: Recent Labs  Lab 02/26/24 0428 02/26/24 0930 02/26/24 1606 02/27/24 0100 02/27/24 0403 02/28/24 0500 02/29/24 0614 03/01/24 0412  NA 141   < > 139 139  --  139 138 137  K 3.1*   < > 3.6 3.3*  --  3.3* 3.4* 3.8  CL 114*   < > 111 112*  --  110 106 105  CO2 14*   < > 17* 20*  --  21* 20* 21*  GLUCOSE 197*   < > 200* 200*  --  162* 124* 182*  BUN 6*   < > <5* <5*  --  8 9 11   CREATININE 1.04   < > 0.77 0.66  --  0.73 0.77 0.87  CALCIUM  7.7*   < > 7.7* 7.6*  --  7.4* 7.4* 7.3*  MG 1.8  --   --   --  1.6* 2.0 1.8 1.7   < > = values in this interval not displayed.   Liver Function Tests: No results for input(s): AST, ALT, ALKPHOS, BILITOT, PROT, ALBUMIN in the last 168 hours. No results for input(s): LIPASE, AMYLASE in the last 168 hours. No results for input(s): AMMONIA in the last 168 hours. CBC: Recent Labs  Lab 02/26/24 0428 02/27/24 0403 02/28/24 0500 02/29/24 0614 03/01/24 0412  WBC 8.0 6.2 5.8 5.9 6.9  HGB 10.3* 9.6* 8.8* 9.4* 8.8*  HCT 30.4* 27.6* 25.6* 27.5* 26.2*  MCV 87.4 85.7 86.5 87.6 89.1  PLT 160 137* 142* 168 215   Cardiac Enzymes: No results for input(s): CKTOTAL, CKMB, CKMBINDEX, TROPONINI in the last 168 hours. BNP: Invalid input(s): POCBNP CBG: Recent Labs  Lab 02/29/24 0609 02/29/24 1052 02/29/24 1555  02/29/24 2121 03/01/24 0623  GLUCAP 123* 180* 162* 170* 142*   D-Dimer No results for input(s): DDIMER in the last 72 hours. Hgb A1c No results for input(s): HGBA1C in the last 72 hours. Lipid Profile No results for input(s): CHOL, HDL, LDLCALC, TRIG, CHOLHDL, LDLDIRECT in the last 72 hours. Thyroid  function studies No results for input(s): TSH, T4TOTAL, T3FREE, THYROIDAB in the last 72 hours.  Invalid input(s): FREET3 Anemia work up No results for input(s): VITAMINB12,  FOLATE, FERRITIN, TIBC, IRON, RETICCTPCT in the last 72 hours. Urinalysis    Component Value Date/Time   COLORURINE YELLOW 02/19/2024 2010   APPEARANCEUR CLEAR 02/19/2024 2010   LABSPEC 1.021 02/19/2024 2010   PHURINE 5.0 02/19/2024 2010   GLUCOSEU >=500 (A) 02/19/2024 2010   HGBUR NEGATIVE 02/19/2024 2010   BILIRUBINUR NEGATIVE 02/19/2024 2010   KETONESUR 80 (A) 02/19/2024 2010   PROTEINUR NEGATIVE 02/19/2024 2010   UROBILINOGEN 0.2 01/16/2014 1252   NITRITE NEGATIVE 02/19/2024 2010   LEUKOCYTESUR NEGATIVE 02/19/2024 2010   Sepsis Labs Recent Labs  Lab 02/27/24 0403 02/28/24 0500 02/29/24 0614 03/01/24 0412  WBC 6.2 5.8 5.9 6.9   Microbiology No results found for this or any previous visit (from the past 240 hours).   Time coordinating discharge: 35 minutes  SIGNED:   Elfrida Pixley, MD  [facility] 03/01/2024, 10:30 AM

## 2024-03-04 DIAGNOSIS — E1169 Type 2 diabetes mellitus with other specified complication: Secondary | ICD-10-CM | POA: Diagnosis not present

## 2024-03-04 DIAGNOSIS — I2510 Atherosclerotic heart disease of native coronary artery without angina pectoris: Secondary | ICD-10-CM | POA: Diagnosis not present

## 2024-03-04 DIAGNOSIS — L22 Diaper dermatitis: Secondary | ICD-10-CM | POA: Diagnosis not present

## 2024-03-04 DIAGNOSIS — F330 Major depressive disorder, recurrent, mild: Secondary | ICD-10-CM | POA: Diagnosis not present

## 2024-03-04 DIAGNOSIS — D649 Anemia, unspecified: Secondary | ICD-10-CM | POA: Diagnosis not present

## 2024-03-04 DIAGNOSIS — I480 Paroxysmal atrial fibrillation: Secondary | ICD-10-CM | POA: Diagnosis not present

## 2024-03-04 DIAGNOSIS — S12120D Other displaced dens fracture, subsequent encounter for fracture with routine healing: Secondary | ICD-10-CM | POA: Diagnosis not present

## 2024-03-04 DIAGNOSIS — E785 Hyperlipidemia, unspecified: Secondary | ICD-10-CM | POA: Diagnosis not present

## 2024-03-04 DIAGNOSIS — R531 Weakness: Secondary | ICD-10-CM | POA: Diagnosis not present

## 2024-03-07 NOTE — Progress Notes (Deleted)
 Cardiology Office Note   Date:  03/07/2024  ID:  Matthew Calderon, DOB 1933-01-19, MRN [id number] PCP: Caleen Dirks, MD  [facility] Providers Cardiologist:  Lynwood Schilling, MD   History of Present Illness Matthew Calderon is a 89 y.o. male with a hx of heart failure with improved EF, type 2 diabetes, hypertension, hyperlipidemia, OSA, mood disorders, recurrent falls. He has been seen by Dr. Schilling in the past and presents today for a hospital follow up appointment    Previously followed by Dr. Claudene but has since been seen by Dr. Schilling.   Patient had previously been admitted in 06/12/2018 - 07/13/2018 with pneumonia and found to have non-STEMI.  Echocardiogram on 06/21/18 showed EF 25-30% with severe hypokinesis of the mid-apical anteroseptal and anterior myocardium.  Per review of cardiology notes, patient and family wanted medical treatment only.  He was started on GDMT.  Repeat echocardiogram in 12/2019 showed EF 55-60%, grade 1 DD, normal RV systolic function, trivial AI, mild dilatation of the aortic root measuring 37 mm.   Patient was seen by Dr. Schilling on 05/10/2021.  Patient was doing well at that time.  Remains on GDMT with carvedilol , Entresto .  Was also on aspirin  and Lipitor.  He has not been seen by cardiology since that time.  Patient recently admitted from 8/28 - 03/01/2024.  He had presented to the ED on 8/27 after he had a ground-level fall and was found to have type II dens fracture seen by neurosurgery.  He was placed in a hard collar and plan was for 2-week outpatient follow-up.  However, patient was brought back to the hospital on 8/28 due to concerns for fever and change in mental status.  CT chest abdomen pelvis showed concerns of bilateral patchy opacities in the lungs and he was admitted with acute metabolic encephalopathy from sepsis/pneumonia as well as DKA.  Underwent echocardiogram and 02/20/2024 that showed EF 60-65%, no regional wall motion abnormalities, grade  1 DD, normal RV systolic function, trivial MR.  He was found to have elevated troponin and cardiology was consulted.  Troponin trended from around 100>1000s.  It was suspected that patient had type II NSTEMI from demand ischemia given sepsis, pneumonia.  He is on Plavix  and Lipitor given findings of coronary calcium  on chest CT scan.    During that admission, patient also had an episode of atrial fibrillation.  Treated with IV amiodarone  drip but was transitioned to carvedilol  6.25 mg twice daily prior to discharge.  Patient was treated with heparin  but was not discharged on oral anticoagulation given her frequent falls.  Paroxysmal Atrial Fibrillation  - Patient had a few episodes of atrial fibrillation while admitted from 8/28-03/01/24 with sepsis/PNA, DKA. Was in NSR at discharge. Was not started on oral anticoagulation due to frequent falls. This was discussed with daughter who was in agreement  - EKG today shows ***  - Continue carvedilol  6.125 mg BID  -   Coronary Calcification  Recent type 2 NSTEMI  - During patient's admission with sepsis, DKA troponin was elevated and peaked at 1119.  Echo showed EF 60 to 65% with no wall motion abnormalities and it was suspected that [place] elevation was demand ischemia - Chest CT that admission also noted four-vessel coronary calcification -  - Continue Lipitor 40 mg daily, Plavix  75 mg daily  HFimpEF  - Previously had EF 25-30% in 2019.  Recent echocardiogram from 01/2024 showed EF 60-65%, no regional wall motion abnormalities, grade 1 DD, normal  RV systolic function, no significant valvular abnormalities -  - Continue current GDMT with carvedilol  6.25 mg twice daily, Entresto  24-26 mg twice daily - A1c 10.1 in 01/2024.  Recently had DKA.  Hold off on SGLT2 for now - Ordered BMP for medication monitoring  HTN  -  - Continue carvedilol  6.25 mg twice daily, Entresto  24/26 mg twice daily  HLD  Aortic atherosclerosis - Echocardiogram from 01/2024  noted moderate (grade 3) calcified plaque involving the aortic root - Lipid panel *** - Continue lipitor 40 mg daily   OSA     ROS: ***  Studies Reviewed      *** Risk Assessment/Calculations {Does this patient have ATRIAL FIBRILLATION?:[id number]} No BP recorded.  {Refresh Note OR Click here to enter BP  :1}***       Physical Exam VS:  There were no vitals taken for this visit.       Wt Readings from Last 3 Encounters:  03/01/24 160 lb 4.4 oz (72.7 kg)  09/04/23 153 lb (69.4 kg)  08/12/23 153 lb 10.6 oz (69.7 kg)    GEN: Well nourished, well developed in no acute distress NECK: No JVD; No carotid bruits CARDIAC: ***RRR, no murmurs, rubs, gallops RESPIRATORY:  Clear to auscultation without rales, wheezing or rhonchi  ABDOMEN: Soft, non-tender, non-distended EXTREMITIES:  No edema; No deformity   ASSESSMENT AND PLAN ***    {Are you ordering a CV Procedure (e.g. stress test, cath, DCCV, TEE, etc)?   Press F2        :[id number]}  Dispo: ***  Signed, Rollo FABIENE Louder, PA-C

## 2024-03-11 ENCOUNTER — Ambulatory Visit: Attending: Cardiology | Admitting: Cardiology

## 2024-03-11 DIAGNOSIS — E785 Hyperlipidemia, unspecified: Secondary | ICD-10-CM | POA: Diagnosis not present

## 2024-03-11 DIAGNOSIS — D649 Anemia, unspecified: Secondary | ICD-10-CM | POA: Diagnosis not present

## 2024-03-11 DIAGNOSIS — F330 Major depressive disorder, recurrent, mild: Secondary | ICD-10-CM | POA: Diagnosis not present

## 2024-03-11 DIAGNOSIS — L22 Diaper dermatitis: Secondary | ICD-10-CM | POA: Diagnosis not present

## 2024-03-15 DIAGNOSIS — S12111A Posterior displaced Type II dens fracture, initial encounter for closed fracture: Secondary | ICD-10-CM | POA: Diagnosis not present

## 2024-03-16 DIAGNOSIS — F330 Major depressive disorder, recurrent, mild: Secondary | ICD-10-CM | POA: Diagnosis not present

## 2024-03-16 DIAGNOSIS — D649 Anemia, unspecified: Secondary | ICD-10-CM | POA: Diagnosis not present

## 2024-03-16 DIAGNOSIS — L22 Diaper dermatitis: Secondary | ICD-10-CM | POA: Diagnosis not present

## 2024-03-16 DIAGNOSIS — E785 Hyperlipidemia, unspecified: Secondary | ICD-10-CM | POA: Diagnosis not present

## 2024-03-24 DIAGNOSIS — R2689 Other abnormalities of gait and mobility: Secondary | ICD-10-CM | POA: Diagnosis not present

## 2024-03-24 DIAGNOSIS — S12112D Nondisplaced Type II dens fracture, subsequent encounter for fracture with routine healing: Secondary | ICD-10-CM | POA: Diagnosis not present

## 2024-03-24 DIAGNOSIS — R278 Other lack of coordination: Secondary | ICD-10-CM | POA: Diagnosis not present

## 2024-03-24 DIAGNOSIS — M6281 Muscle weakness (generalized): Secondary | ICD-10-CM | POA: Diagnosis not present

## 2024-03-24 DIAGNOSIS — A419 Sepsis, unspecified organism: Secondary | ICD-10-CM | POA: Diagnosis not present

## 2024-03-24 DIAGNOSIS — R1312 Dysphagia, oropharyngeal phase: Secondary | ICD-10-CM | POA: Diagnosis not present

## 2024-03-25 DIAGNOSIS — D649 Anemia, unspecified: Secondary | ICD-10-CM | POA: Diagnosis not present

## 2024-03-25 DIAGNOSIS — F330 Major depressive disorder, recurrent, mild: Secondary | ICD-10-CM | POA: Diagnosis not present

## 2024-03-25 DIAGNOSIS — L22 Diaper dermatitis: Secondary | ICD-10-CM | POA: Diagnosis not present

## 2024-03-25 DIAGNOSIS — E785 Hyperlipidemia, unspecified: Secondary | ICD-10-CM | POA: Diagnosis not present

## 2024-03-30 DIAGNOSIS — M6281 Muscle weakness (generalized): Secondary | ICD-10-CM | POA: Diagnosis not present

## 2024-03-30 DIAGNOSIS — R1312 Dysphagia, oropharyngeal phase: Secondary | ICD-10-CM | POA: Diagnosis not present

## 2024-03-30 DIAGNOSIS — S12112D Nondisplaced Type II dens fracture, subsequent encounter for fracture with routine healing: Secondary | ICD-10-CM | POA: Diagnosis not present

## 2024-03-30 DIAGNOSIS — A419 Sepsis, unspecified organism: Secondary | ICD-10-CM | POA: Diagnosis not present

## 2024-03-30 DIAGNOSIS — R278 Other lack of coordination: Secondary | ICD-10-CM | POA: Diagnosis not present

## 2024-03-30 DIAGNOSIS — R2689 Other abnormalities of gait and mobility: Secondary | ICD-10-CM | POA: Diagnosis not present

## 2024-03-31 DIAGNOSIS — R2689 Other abnormalities of gait and mobility: Secondary | ICD-10-CM | POA: Diagnosis not present

## 2024-03-31 DIAGNOSIS — R278 Other lack of coordination: Secondary | ICD-10-CM | POA: Diagnosis not present

## 2024-03-31 DIAGNOSIS — M6281 Muscle weakness (generalized): Secondary | ICD-10-CM | POA: Diagnosis not present

## 2024-03-31 DIAGNOSIS — R1312 Dysphagia, oropharyngeal phase: Secondary | ICD-10-CM | POA: Diagnosis not present

## 2024-03-31 DIAGNOSIS — A419 Sepsis, unspecified organism: Secondary | ICD-10-CM | POA: Diagnosis not present

## 2024-03-31 DIAGNOSIS — S12112D Nondisplaced Type II dens fracture, subsequent encounter for fracture with routine healing: Secondary | ICD-10-CM | POA: Diagnosis not present

## 2024-04-01 DIAGNOSIS — R2689 Other abnormalities of gait and mobility: Secondary | ICD-10-CM | POA: Diagnosis not present

## 2024-04-01 DIAGNOSIS — R278 Other lack of coordination: Secondary | ICD-10-CM | POA: Diagnosis not present

## 2024-04-01 DIAGNOSIS — R1312 Dysphagia, oropharyngeal phase: Secondary | ICD-10-CM | POA: Diagnosis not present

## 2024-04-01 DIAGNOSIS — A419 Sepsis, unspecified organism: Secondary | ICD-10-CM | POA: Diagnosis not present

## 2024-04-01 DIAGNOSIS — S12112D Nondisplaced Type II dens fracture, subsequent encounter for fracture with routine healing: Secondary | ICD-10-CM | POA: Diagnosis not present

## 2024-04-01 DIAGNOSIS — M6281 Muscle weakness (generalized): Secondary | ICD-10-CM | POA: Diagnosis not present

## 2024-04-05 DIAGNOSIS — R1312 Dysphagia, oropharyngeal phase: Secondary | ICD-10-CM | POA: Diagnosis not present

## 2024-04-05 DIAGNOSIS — S12112D Nondisplaced Type II dens fracture, subsequent encounter for fracture with routine healing: Secondary | ICD-10-CM | POA: Diagnosis not present

## 2024-04-05 DIAGNOSIS — A419 Sepsis, unspecified organism: Secondary | ICD-10-CM | POA: Diagnosis not present

## 2024-04-05 DIAGNOSIS — M6281 Muscle weakness (generalized): Secondary | ICD-10-CM | POA: Diagnosis not present

## 2024-04-05 DIAGNOSIS — R278 Other lack of coordination: Secondary | ICD-10-CM | POA: Diagnosis not present

## 2024-04-05 DIAGNOSIS — R2689 Other abnormalities of gait and mobility: Secondary | ICD-10-CM | POA: Diagnosis not present

## 2024-04-06 DIAGNOSIS — A419 Sepsis, unspecified organism: Secondary | ICD-10-CM | POA: Diagnosis not present

## 2024-04-06 DIAGNOSIS — L602 Onychogryphosis: Secondary | ICD-10-CM | POA: Diagnosis not present

## 2024-04-06 DIAGNOSIS — R2689 Other abnormalities of gait and mobility: Secondary | ICD-10-CM | POA: Diagnosis not present

## 2024-04-06 DIAGNOSIS — M21611 Bunion of right foot: Secondary | ICD-10-CM | POA: Diagnosis not present

## 2024-04-06 DIAGNOSIS — M21612 Bunion of left foot: Secondary | ICD-10-CM | POA: Diagnosis not present

## 2024-04-06 DIAGNOSIS — R1312 Dysphagia, oropharyngeal phase: Secondary | ICD-10-CM | POA: Diagnosis not present

## 2024-04-06 DIAGNOSIS — M6281 Muscle weakness (generalized): Secondary | ICD-10-CM | POA: Diagnosis not present

## 2024-04-06 DIAGNOSIS — L603 Nail dystrophy: Secondary | ICD-10-CM | POA: Diagnosis not present

## 2024-04-06 DIAGNOSIS — I739 Peripheral vascular disease, unspecified: Secondary | ICD-10-CM | POA: Diagnosis not present

## 2024-04-06 DIAGNOSIS — R278 Other lack of coordination: Secondary | ICD-10-CM | POA: Diagnosis not present

## 2024-04-06 DIAGNOSIS — S12112D Nondisplaced Type II dens fracture, subsequent encounter for fracture with routine healing: Secondary | ICD-10-CM | POA: Diagnosis not present

## 2024-04-07 ENCOUNTER — Encounter (HOSPITAL_COMMUNITY): Payer: Self-pay

## 2024-04-07 ENCOUNTER — Emergency Department (HOSPITAL_COMMUNITY)

## 2024-04-07 ENCOUNTER — Inpatient Hospital Stay (HOSPITAL_COMMUNITY)
Admission: EM | Admit: 2024-04-07 | Discharge: 2024-04-24 | DRG: 951 | Disposition: E | Source: Skilled Nursing Facility | Attending: Internal Medicine | Admitting: Internal Medicine

## 2024-04-07 ENCOUNTER — Other Ambulatory Visit: Payer: Self-pay

## 2024-04-07 DIAGNOSIS — I2510 Atherosclerotic heart disease of native coronary artery without angina pectoris: Secondary | ICD-10-CM | POA: Diagnosis present

## 2024-04-07 DIAGNOSIS — I5020 Unspecified systolic (congestive) heart failure: Secondary | ICD-10-CM | POA: Diagnosis present

## 2024-04-07 DIAGNOSIS — I619 Nontraumatic intracerebral hemorrhage, unspecified: Secondary | ICD-10-CM | POA: Diagnosis not present

## 2024-04-07 DIAGNOSIS — Z96641 Presence of right artificial hip joint: Secondary | ICD-10-CM | POA: Diagnosis present

## 2024-04-07 DIAGNOSIS — Z7902 Long term (current) use of antithrombotics/antiplatelets: Secondary | ICD-10-CM

## 2024-04-07 DIAGNOSIS — Z888 Allergy status to other drugs, medicaments and biological substances status: Secondary | ICD-10-CM

## 2024-04-07 DIAGNOSIS — G4733 Obstructive sleep apnea (adult) (pediatric): Secondary | ICD-10-CM | POA: Diagnosis present

## 2024-04-07 DIAGNOSIS — I615 Nontraumatic intracerebral hemorrhage, intraventricular: Secondary | ICD-10-CM | POA: Diagnosis not present

## 2024-04-07 DIAGNOSIS — A419 Sepsis, unspecified organism: Secondary | ICD-10-CM | POA: Diagnosis not present

## 2024-04-07 DIAGNOSIS — Z515 Encounter for palliative care: Secondary | ICD-10-CM | POA: Diagnosis not present

## 2024-04-07 DIAGNOSIS — Z880 Allergy status to penicillin: Secondary | ICD-10-CM

## 2024-04-07 DIAGNOSIS — R9089 Other abnormal findings on diagnostic imaging of central nervous system: Secondary | ICD-10-CM | POA: Diagnosis not present

## 2024-04-07 DIAGNOSIS — G919 Hydrocephalus, unspecified: Secondary | ICD-10-CM | POA: Diagnosis present

## 2024-04-07 DIAGNOSIS — Z7189 Other specified counseling: Secondary | ICD-10-CM | POA: Diagnosis not present

## 2024-04-07 DIAGNOSIS — R4189 Other symptoms and signs involving cognitive functions and awareness: Secondary | ICD-10-CM | POA: Diagnosis not present

## 2024-04-07 DIAGNOSIS — W1830XD Fall on same level, unspecified, subsequent encounter: Secondary | ICD-10-CM | POA: Diagnosis not present

## 2024-04-07 DIAGNOSIS — Z1152 Encounter for screening for COVID-19: Secondary | ICD-10-CM | POA: Diagnosis not present

## 2024-04-07 DIAGNOSIS — E119 Type 2 diabetes mellitus without complications: Secondary | ICD-10-CM | POA: Diagnosis not present

## 2024-04-07 DIAGNOSIS — I5030 Unspecified diastolic (congestive) heart failure: Secondary | ICD-10-CM | POA: Diagnosis present

## 2024-04-07 DIAGNOSIS — R296 Repeated falls: Secondary | ICD-10-CM | POA: Diagnosis present

## 2024-04-07 DIAGNOSIS — R4182 Altered mental status, unspecified: Secondary | ICD-10-CM | POA: Diagnosis not present

## 2024-04-07 DIAGNOSIS — Z882 Allergy status to sulfonamides status: Secondary | ICD-10-CM

## 2024-04-07 DIAGNOSIS — I480 Paroxysmal atrial fibrillation: Secondary | ICD-10-CM | POA: Diagnosis not present

## 2024-04-07 DIAGNOSIS — Z87891 Personal history of nicotine dependence: Secondary | ICD-10-CM

## 2024-04-07 DIAGNOSIS — J984 Other disorders of lung: Secondary | ICD-10-CM | POA: Diagnosis not present

## 2024-04-07 DIAGNOSIS — F918 Other conduct disorders: Secondary | ICD-10-CM | POA: Diagnosis not present

## 2024-04-07 DIAGNOSIS — I10 Essential (primary) hypertension: Secondary | ICD-10-CM | POA: Diagnosis not present

## 2024-04-07 DIAGNOSIS — I110 Hypertensive heart disease with heart failure: Secondary | ICD-10-CM | POA: Diagnosis present

## 2024-04-07 DIAGNOSIS — S12110D Anterior displaced Type II dens fracture, subsequent encounter for fracture with routine healing: Secondary | ICD-10-CM | POA: Diagnosis not present

## 2024-04-07 DIAGNOSIS — Z79899 Other long term (current) drug therapy: Secondary | ICD-10-CM

## 2024-04-07 DIAGNOSIS — E785 Hyperlipidemia, unspecified: Secondary | ICD-10-CM | POA: Diagnosis present

## 2024-04-07 DIAGNOSIS — I609 Nontraumatic subarachnoid hemorrhage, unspecified: Secondary | ICD-10-CM | POA: Diagnosis present

## 2024-04-07 DIAGNOSIS — Z794 Long term (current) use of insulin: Secondary | ICD-10-CM | POA: Diagnosis not present

## 2024-04-07 DIAGNOSIS — Z8701 Personal history of pneumonia (recurrent): Secondary | ICD-10-CM

## 2024-04-07 DIAGNOSIS — Z8261 Family history of arthritis: Secondary | ICD-10-CM | POA: Diagnosis not present

## 2024-04-07 DIAGNOSIS — R9389 Abnormal findings on diagnostic imaging of other specified body structures: Secondary | ICD-10-CM | POA: Diagnosis not present

## 2024-04-07 DIAGNOSIS — I6529 Occlusion and stenosis of unspecified carotid artery: Secondary | ICD-10-CM | POA: Diagnosis not present

## 2024-04-07 DIAGNOSIS — R918 Other nonspecific abnormal finding of lung field: Secondary | ICD-10-CM | POA: Diagnosis not present

## 2024-04-07 DIAGNOSIS — K219 Gastro-esophageal reflux disease without esophagitis: Secondary | ICD-10-CM | POA: Diagnosis present

## 2024-04-07 DIAGNOSIS — Z789 Other specified health status: Secondary | ICD-10-CM | POA: Diagnosis not present

## 2024-04-07 DIAGNOSIS — N400 Enlarged prostate without lower urinary tract symptoms: Secondary | ICD-10-CM | POA: Diagnosis present

## 2024-04-07 DIAGNOSIS — Z66 Do not resuscitate: Secondary | ICD-10-CM | POA: Diagnosis not present

## 2024-04-07 LAB — URINALYSIS, W/ REFLEX TO CULTURE (INFECTION SUSPECTED)
Bilirubin Urine: NEGATIVE
Glucose, UA: NEGATIVE mg/dL
Hgb urine dipstick: NEGATIVE
Ketones, ur: 5 mg/dL — AB
Leukocytes,Ua: NEGATIVE
Nitrite: NEGATIVE
Protein, ur: NEGATIVE mg/dL
Specific Gravity, Urine: 1.014 (ref 1.005–1.030)
pH: 5 (ref 5.0–8.0)

## 2024-04-07 LAB — CBC WITH DIFFERENTIAL/PLATELET
Abs Immature Granulocytes: 0.1 K/uL — ABNORMAL HIGH (ref 0.00–0.07)
Basophils Absolute: 0.1 K/uL (ref 0.0–0.1)
Basophils Relative: 1 %
Eosinophils Absolute: 0.2 K/uL (ref 0.0–0.5)
Eosinophils Relative: 2 %
HCT: 31.3 % — ABNORMAL LOW (ref 39.0–52.0)
Hemoglobin: 10 g/dL — ABNORMAL LOW (ref 13.0–17.0)
Immature Granulocytes: 1 %
Lymphocytes Relative: 7 %
Lymphs Abs: 0.7 K/uL (ref 0.7–4.0)
MCH: 28 pg (ref 26.0–34.0)
MCHC: 31.9 g/dL (ref 30.0–36.0)
MCV: 87.7 fL (ref 80.0–100.0)
Monocytes Absolute: 0.5 K/uL (ref 0.1–1.0)
Monocytes Relative: 5 %
Neutro Abs: 8.2 K/uL — ABNORMAL HIGH (ref 1.7–7.7)
Neutrophils Relative %: 84 %
Platelets: 435 K/uL — ABNORMAL HIGH (ref 150–400)
RBC: 3.57 MIL/uL — ABNORMAL LOW (ref 4.22–5.81)
RDW: 13.5 % (ref 11.5–15.5)
WBC: 9.8 K/uL (ref 4.0–10.5)
nRBC: 0 % (ref 0.0–0.2)

## 2024-04-07 LAB — COMPREHENSIVE METABOLIC PANEL WITH GFR
ALT: 65 U/L — ABNORMAL HIGH (ref 0–44)
AST: 35 U/L (ref 15–41)
Albumin: 2.2 g/dL — ABNORMAL LOW (ref 3.5–5.0)
Alkaline Phosphatase: 85 U/L (ref 38–126)
Anion gap: 17 — ABNORMAL HIGH (ref 5–15)
BUN: 17 mg/dL (ref 8–23)
CO2: 18 mmol/L — ABNORMAL LOW (ref 22–32)
Calcium: 8.4 mg/dL — ABNORMAL LOW (ref 8.9–10.3)
Chloride: 99 mmol/L (ref 98–111)
Creatinine, Ser: 1.04 mg/dL (ref 0.61–1.24)
GFR, Estimated: >60 mL/min (ref >60)
Glucose, Bld: 148 mg/dL — ABNORMAL HIGH (ref 70–99)
Potassium: 4.8 mmol/L (ref 3.5–5.1)
Sodium: 134 mmol/L — ABNORMAL LOW (ref 135–145)
Total Bilirubin: 1 mg/dL (ref 0.0–1.2)
Total Protein: 6.8 g/dL (ref 6.5–8.1)

## 2024-04-07 LAB — RESP PANEL BY RT-PCR (RSV, FLU A&B, COVID)  RVPGX2
Influenza A by PCR: NEGATIVE
Influenza B by PCR: NEGATIVE
Resp Syncytial Virus by PCR: NEGATIVE
SARS Coronavirus 2 by RT PCR: NEGATIVE

## 2024-04-07 LAB — PROTIME-INR
INR: 1.4 — ABNORMAL HIGH (ref 0.8–1.2)
Prothrombin Time: 17.4 s — ABNORMAL HIGH (ref 11.4–15.2)

## 2024-04-07 LAB — I-STAT CG4 LACTIC ACID, ED: Lactic Acid, Venous: 1.5 mmol/L (ref 0.5–1.9)

## 2024-04-07 LAB — CBG MONITORING, ED: Glucose-Capillary: 138 mg/dL — ABNORMAL HIGH (ref 70–99)

## 2024-04-07 MED ORDER — LORAZEPAM 2 MG/ML IJ SOLN
1.0000 mg | INTRAMUSCULAR | Status: DC | PRN
Start: 2024-04-07 — End: 2024-04-11
  Administered 2024-04-07: 1 mg via INTRAVENOUS

## 2024-04-07 MED ORDER — GLYCOPYRROLATE 0.2 MG/ML IJ SOLN
0.2000 mg | INTRAMUSCULAR | Status: AC | PRN
Start: 2024-04-07 — End: ?

## 2024-04-07 MED ORDER — VANCOMYCIN HCL IN DEXTROSE 1-5 GM/200ML-% IV SOLN
1000.0000 mg | Freq: Once | INTRAVENOUS | Status: AC
  Administered 2024-04-07: 1000 mg via INTRAVENOUS
  Filled 2024-04-07: qty 200

## 2024-04-07 MED ORDER — LACTATED RINGERS IV SOLN: INTRAVENOUS | Status: DC

## 2024-04-07 MED ORDER — POLYVINYL ALCOHOL 1.4 % OP SOLN: 1.0000 [drp] | Freq: Four times a day (QID) | OPHTHALMIC | Status: DC | PRN

## 2024-04-07 MED ORDER — ACETAMINOPHEN 650 MG RE SUPP
650.0000 mg | Freq: Four times a day (QID) | RECTAL | Status: DC | PRN
Start: 2024-04-07 — End: 2024-04-13
  Administered 2024-04-07: 650 mg via RECTAL
  Filled 2024-04-07: qty 1

## 2024-04-07 MED ORDER — HALOPERIDOL 0.5 MG PO TABS
0.5000 mg | ORAL_TABLET | ORAL | Status: DC | PRN
Start: 2024-04-07 — End: 2024-04-13

## 2024-04-07 MED ORDER — LORAZEPAM 2 MG/ML PO CONC: 1.0000 mg | ORAL | Status: DC | PRN

## 2024-04-07 MED ORDER — FENTANYL CITRATE (PF) 50 MCG/ML IJ SOSY
50.0000 ug | PREFILLED_SYRINGE | Freq: Once | INTRAMUSCULAR | Status: AC
  Administered 2024-04-07: 50 ug via INTRAVENOUS
  Filled 2024-04-07: qty 1

## 2024-04-07 MED ORDER — BIOTENE DRY MOUTH MT LIQD: 15.0000 mL | OROMUCOSAL | Status: DC | PRN

## 2024-04-07 MED ORDER — ONDANSETRON 4 MG PO TBDP: 4.0000 mg | ORAL_TABLET | Freq: Four times a day (QID) | ORAL | Status: DC | PRN

## 2024-04-07 MED ORDER — LORAZEPAM 2 MG/ML IJ SOLN
1.0000 mg | INTRAMUSCULAR | Status: DC | PRN
  Filled 2024-04-07: qty 1

## 2024-04-07 MED ORDER — METRONIDAZOLE 500 MG/100ML IV SOLN
500.0000 mg | Freq: Once | INTRAVENOUS | Status: AC
  Administered 2024-04-07: 500 mg via INTRAVENOUS
  Filled 2024-04-07: qty 100

## 2024-04-07 MED ORDER — MORPHINE 100MG IN NS 100ML (1MG/ML) PREMIX INFUSION
1.0000 mg/h | INTRAVENOUS | Status: DC
  Administered 2024-04-07: 2.5 mg/h via INTRAVENOUS
  Administered 2024-04-08: 3.75 mg/h via INTRAVENOUS
  Administered 2024-04-09 – 2024-04-12 (×3): 3.8 mg/h via INTRAVENOUS
  Administered 2024-04-12: 7.6 mg/h via INTRAVENOUS
  Filled 2024-04-07 (×6): qty 100

## 2024-04-07 MED ORDER — HALOPERIDOL LACTATE 2 MG/ML PO CONC: 0.5000 mg | ORAL | Status: DC | PRN

## 2024-04-07 MED ORDER — SCOPOLAMINE 1 MG/3DAYS TD PT72
1.0000 | MEDICATED_PATCH | TRANSDERMAL | Status: DC
  Administered 2024-04-07 – 2024-04-10 (×2): 1 mg via TRANSDERMAL
  Filled 2024-04-07 (×2): qty 1

## 2024-04-07 MED ORDER — ONDANSETRON HCL 4 MG/2ML IJ SOLN: 4.0000 mg | Freq: Four times a day (QID) | INTRAMUSCULAR | Status: DC | PRN

## 2024-04-07 MED ORDER — GLYCOPYRROLATE 0.2 MG/ML IJ SOLN
0.2000 mg | INTRAMUSCULAR | Status: AC | PRN
Start: 2024-04-07 — End: ?
  Administered 2024-04-08 (×4): 0.2 mg via INTRAVENOUS
  Filled 2024-04-07 (×4): qty 1

## 2024-04-07 MED ORDER — LORAZEPAM 1 MG PO TABS: 1.0000 mg | ORAL_TABLET | ORAL | Status: DC | PRN

## 2024-04-07 MED ORDER — GLYCOPYRROLATE 1 MG PO TABS: 1.0000 mg | ORAL_TABLET | ORAL | Status: DC | PRN

## 2024-04-07 MED ORDER — ACETAMINOPHEN 325 MG PO TABS: 650.0000 mg | ORAL_TABLET | Freq: Four times a day (QID) | ORAL | Status: DC | PRN

## 2024-04-07 MED ORDER — SODIUM CHLORIDE 0.9 % IV SOLN
2.0000 g | Freq: Once | INTRAVENOUS | Status: AC
  Administered 2024-04-07: 2 g via INTRAVENOUS
  Filled 2024-04-07: qty 12.5

## 2024-04-07 MED ORDER — LACTATED RINGERS IV BOLUS (SEPSIS)
1000.0000 mL | Freq: Once | INTRAVENOUS | Status: AC
  Administered 2024-04-07: 1000 mL via INTRAVENOUS

## 2024-04-07 MED ORDER — ALBUTEROL SULFATE (2.5 MG/3ML) 0.083% IN NEBU: 2.5000 mg | INHALATION_SOLUTION | RESPIRATORY_TRACT | Status: DC | PRN

## 2024-04-07 MED ORDER — HALOPERIDOL LACTATE 5 MG/ML IJ SOLN: 0.5000 mg | INTRAMUSCULAR | Status: DC | PRN

## 2024-04-07 NOTE — H&P (Addendum)
 History and Physical    Patient: Matthew Calderon FMW:[id number] DOB: January 03, 1933 DOA: 04/07/2024 DOS: the patient was seen and examined on 04/07/2024 PCP: Caleen Dirks, MD  Patient coming from: [facility] via EMS  Chief Complaint:  Chief Complaint  Patient presents with   Fatigue   HPI: DENNARD VEZINA is a 89 y.o. male with medical history significant of CAD, CHF, DM2, HTN, HLD, OSA, disorder, recurrent falls comes to the ED from [facility] after being noted to be lethargic and not arousable by staff.  Patient is unable to give any history as he is nonresponsive and is obtained from review of records.  Last known well was around 10 PM last night.  Patient patient is on Plavix .  Patient was only noted to be moving the left leg and nonverbal.  No report of any fall.  Patient had just recently been hospitalized after presenting from a ground-level fall found to have a type II dens fracture the day before from 8/28-9/8 found to be septic secondary to pneumonia.  During hospitalization also was treated for euglycemic diabetic ketoacidosis thought secondary to [place].  Ultimately he was able to be discharged back to [facility].  Over the phone for patient's daughter confirms that they would not recommend any surgical intervention and that CODE STATUS was to remain DNR.  They want to keep the patient comfortable and out of pain.  He lives in [place] and is in the process of trying to come to the hospital.  In the ED patient was noted to be febrile up to 101.3 F with blood pressures maintained.  Labs noted hemoglobin 10, platelets 435, sodium 134, CO2 18, glucose 148, calcium  8.4, anion gap 17, AST 35, ALT 65, and lactic acid 1.5.  Urinalysis did not note significant signs for infection and blood cultures were obtained.  Chest x-ray showed low lung volumes with subtle airspace disease in the lung bases.  CT scan of the head noted hemorrhage standard within the right caudate lobe decompression into the  ventricle system resulting in diffuse intraventricular hemorrhage as well as subarachnoid hemorrhage involving the right cerebellopontine angle cistern.  Review of Systems: As mentioned in the history of present illness. All other systems reviewed and are negative. Past Medical History:  Diagnosis Date   Anemia    takes iron pill   Anxiety    Arthritis    Depression    Diabetes mellitus without complication (HCC)    GERD (gastroesophageal reflux disease)    pepto   H/O hiatal hernia    Pneumonia    4 years ago   PONV (postoperative nausea and vomiting) 05/14/2019   Post-nasal drip    Prostate hyperplasia, benign localized, without urinary obstruction    Seasonal allergies    Sleep apnea    no CPAP   Past Surgical History:  Procedure Laterality Date   BACK SURGERY  1995   discectomy   COLONOSCOPY W/ BIOPSIES AND POLYPECTOMY     EYE SURGERY Bilateral    cataracts   EYE SURGERY Bilateral    glaucoma shunts   JOINT REPLACEMENT Left 1992   knee   JOINT REPLACEMENT Right 1996   knee   JOINT REPLACEMENT Right 2009   hip   JOINT REPLACEMENT Left 2005   hip   KNEE ARTHROSCOPY Left 1981   KNEE ARTHROSCOPY WITH PATELLA RECONSTRUCTION Right 2010   KNEE SURGERY Bilateral 1954   knee surgery and placed in a cast for 6 weeks   KNEE SURGERY  1961   cartilage removed   LEG SURGERY Bilateral 1974, 1975   straighten legs   SHOULDER OPEN ROTATOR CUFF REPAIR Right 2010   TONSILLECTOMY     TOTAL HIP ARTHROPLASTY Right 01/13/2014   Procedure: TOTAL HIP ARTHROPLASTY ANTERIOR APPROACH;  Surgeon: Maude KANDICE Herald, MD;  Location: [facility] OR;  Service: Orthopedics;  Laterality: Right;   Social History:  reports that he quit smoking about 47 years ago. His smoking use included cigarettes. He started smoking about 65 years ago. He has a 18 pack-year smoking history. He has never used smokeless tobacco. He reports current alcohol use. He reports that he does not use drugs.  Allergies  Allergen  Reactions   Alendronate Sodium     Other reaction(s): diarrhea   Penicillin G     Other reaction(s): Unknown   Penicillins Hives    DID THE REACTION INVOLVE: Swelling of the face/tongue/throat, SOB, or low BP? No Sudden or severe rash/hives, skin peeling, or the inside of the mouth or nose? No Did it require medical treatment? No When did it last happen?childhood allergy       If all above answers are "NO", may proceed with cephalosporin use.    Sulfa Antibiotics Hives   Sulfacetamide Sodium     Other reaction(s): Unknown    Family History  Problem Relation Age of Onset   Arthritis Mother    Arthritis Father     Prior to Admission medications   Medication Sig Start Date End Date Taking? Authorizing Provider  acetaminophen  (TYLENOL ) 500 MG tablet Take 650 mg by mouth 2 (two) times daily as needed for mild pain (pain score 1-3) or moderate pain (pain score 4-6). 11/17/13  Yes [provider]  atorvastatin  (LIPITOR) 40 MG tablet Take 1 tablet (40 mg total) by mouth daily at 6 PM. 06/26/18  Yes Elgergawy, Brayton RAMAN, MD  brimonidine  (ALPHAGAN ) 0.2 % ophthalmic solution Place 1 drop into both eyes 2 (two) times daily. 09/22/17  Yes [provider]  CARVEDILOL  PO Take 10 mg by mouth daily.   Yes [provider]  Cholecalciferol  (VITAMIN D3) 50 MCG (2000 UT) TABS Take 2,000 Units by mouth daily.   Yes [provider]  clopidogrel  (PLAVIX ) 75 MG tablet Take 1 tablet (75 mg total) by mouth daily. 03/01/24  Yes Sigdel, Derryl, MD  FERROUS SULFATE  DRIED ER PO Take 140 mg by mouth daily.   Yes [provider]  finasteride  (PROSCAR ) 5 MG tablet Take 5 mg by mouth every evening.  03/11/19  Yes [provider]  furosemide  (LASIX ) 20 MG tablet Take 20 mg by mouth daily as needed for fluid or edema.   Yes [provider]  guaiFENesin  (MUCINEX ) 600 MG 12 hr tablet Take 1 tablet (600 mg total) by mouth 2 (two) times daily. 08/15/23  Yes Christobal Guadalajara,  MD  insulin  aspart (NOVOLOG ) 100 UNIT/ML injection Inject 0-9 Units into the skin 3 (three) times daily with meals. Patient taking differently: Inject 0-10 Units into the skin 3 (three) times daily with meals. 70-110 = 0 units 111-150 = 2 units 151-200 = 3 units 201-250 = 4 units 251-300 = 6 units 301-350 = 8 units 351-400 = 10 units 03/01/24  Yes Sigdel, Santosh, MD  insulin  glargine (LANTUS ) 100 UNIT/ML injection Inject 0.15 mLs (15 Units total) into the skin daily. 03/02/24  Yes Sigdel, Santosh, MD  mirabegron  ER (MYRBETRIQ ) 50 MG TB24 tablet Take 50 mg by mouth daily.   Yes [provider]  Multiple Vitamins-Minerals (PRESERVISION AREDS 2) CAPS Take 1 capsule by mouth 2 (two) times daily.    Yes [provider]  nystatin  (MYCOSTATIN /NYSTOP ) powder Apply topically 3 (three) times daily. 03/01/24  Yes Sigdel, Santosh, MD  potassium chloride  (KLOR-CON ) 10 MEQ tablet Take 10 mEq by mouth daily.   Yes [provider]  primidone (MYSOLINE) 50 MG tablet Take 150 mg by mouth 3 (three) times daily.   Yes [provider]  sertraline  (ZOLOFT ) 50 MG tablet Take 50 mg by mouth every evening.   Yes [provider]  tamsulosin  (FLOMAX ) 0.4 MG CAPS capsule Take 0.4 mg by mouth at bedtime.   Yes [provider]  timolol  (TIMOPTIC ) 0.5 % ophthalmic solution Place 1 drop into both eyes 2 (two) times daily. 09/22/17  Yes [provider]  cetirizine (ZYRTEC) 10 MG tablet Take 10 mg by mouth daily. Patient not taking: Reported on 04/07/2024    [provider]  sacubitril -valsartan  (ENTRESTO ) 24-26 MG Take 1 tablet by mouth 2 (two) times daily. Patient not taking: Reported on 04/07/2024    [provider]    Physical Exam: Vitals:   04/07/24 1157 04/07/24 1200 04/07/24 1212 04/07/24 1245  BP:    (!) 129/52  Pulse: 88 66 89 94  Resp: 17 16 19  (!) 21  Temp:      TempSrc:      SpO2: 100% 100% 100% 100%  Weight:      Height:        Constitutional: Elderly male currently not responsive Eyes: Endpoint pupils nonreactive ENMT: Mucous membranes are moist.   Neck: normal, supple  Respiratory: Coarse breath sounds.  Cardiovascular: Regular rate and rhythm, no murmurs / rubs / gallops.  Trace lower extremity edema present. Abdomen: no tenderness, no masses palpated.  Bowel sounds positive.  Musculoskeletal: no clubbing / cyanosis. No joint deformity upper and lower extremities. Good ROM, no contractures. Normal muscle tone.  Skin: no rashes, lesions, ulcers. No induration Neurologic: Patient spontaneous moving left lower extremity.   Patient with movement to all noxious stimuli of the other extremities Psychiatric: Unresponsive unable to assess orientation.  Data Reviewed:  EKG reveals sinus rhythm 83 bpm.  Reviewed labs, imaging, and pertinent records as documented.  Assessment and Plan:  Intraventricular hemorrhage Subarachnoid hemorrhage Unresponsiveness Heart failure with improved ejection fraction Paroxysmal atrial fibrillation CAD Hypertension Hyperlipidemia Diabetes mellitus type 2 OSA Patient presented after being noted to be acutely altered and unresponsive.  On physical exam noted to be moving left lower extremity spontaneously and all other extremities only to noxious stimuli.  CT scan of the head noted large intraventricular hemorrhage.  Family confirmed DNR status.  Neurosurgery have been consulted, but no surgical intervention was agreed upon for which comfort measures were recommended to keep the patient comfortable. - Admit to a MedSurg bed - Palliative care order set initiated - Discontinue cardiac monitoring - Routine vital sign checks - Discontinued home medications - N.p.o. - Okay for RN to pronounce death - Aspiration precautions - Maintain IV access - Continuous nasal cannula oxygen as needed for comfort - Morphine  drip  - Zofran  IV prn nausea/vomiting - Ativan IV prn anxiety -  Haloperidol  IV prn agitation or delirium - Apply scopolamine patch - Glycopyrrolate  prn excessive secretions - Albuterol  prn wheezing   - May consider consult to palliative care in a.m.  DVT prophylaxis: None Advance Care Planning:   Code Status: Do not attempt resuscitation (DNR) - Comfort care  Consults: None  Family Communication: Daughter and granddaughter updated over the phone Severity of Illness: The appropriate patient status for this patient is INPATIENT. Inpatient status is judged to be reasonable and necessary in order to provide the required intensity of service to ensure the patient's safety. The patient's presenting symptoms, physical exam findings, and initial radiographic and laboratory data in the context of their chronic comorbidities is felt to place them at high risk for further clinical deterioration. Furthermore, it is not anticipated that the patient will be medically stable for discharge from the hospital within 2 midnights of admission.   * I certify that at the point of admission it is my clinical judgment that the patient will require inpatient hospital care spanning beyond 2 midnights from the point of admission due to high intensity of service, high risk for further deterioration and high frequency of surveillance required.*  Author: Maximino DELENA Sharps, MD 04/07/2024 1:39 PM  For on call review [email or web address].

## 2024-04-07 NOTE — ED Provider Notes (Signed)
  EMERGENCY DEPARTMENT AT [facility] Provider Note   CSN: [id number] Arrival date & time: 04/07/24  1120     Patient presents with: Fatigue   Matthew Calderon is a 89 y.o. male.   HPI Patient presents with decreased responsiveness.  Reportedly last normal at 10 PM last night.  Normally conversive.  However this morning unresponsive.  Patient DNR.  Will move left leg spontaneously.  Nonverbal.  Is on Plavix .  No reported trauma.  Found to be febrile upon arrival.    Prior to Admission medications   Medication Sig Start Date End Date Taking? Authorizing Provider  acetaminophen  (TYLENOL ) 500 MG tablet Take 650 mg by mouth 2 (two) times daily as needed for mild pain (pain score 1-3) or moderate pain (pain score 4-6). 11/17/13  Yes [provider]  atorvastatin  (LIPITOR) 40 MG tablet Take 1 tablet (40 mg total) by mouth daily at 6 PM. 06/26/18  Yes Elgergawy, Brayton RAMAN, MD  brimonidine  (ALPHAGAN ) 0.2 % ophthalmic solution Place 1 drop into both eyes 2 (two) times daily. 09/22/17  Yes [provider]  CARVEDILOL  PO Take 10 mg by mouth daily.   Yes [provider]  Cholecalciferol  (VITAMIN D3) 50 MCG (2000 UT) TABS Take 2,000 Units by mouth daily.   Yes [provider]  clopidogrel  (PLAVIX ) 75 MG tablet Take 1 tablet (75 mg total) by mouth daily. 03/01/24  Yes Sigdel, Santosh, MD  FERROUS SULFATE  DRIED ER PO Take 140 mg by mouth daily.   Yes [provider]  finasteride  (PROSCAR ) 5 MG tablet Take 5 mg by mouth every evening.  03/11/19  Yes [provider]  furosemide  (LASIX ) 20 MG tablet Take 20 mg by mouth daily as needed for fluid or edema.   Yes [provider]  guaiFENesin  (MUCINEX ) 600 MG 12 hr tablet Take 1 tablet (600 mg total) by mouth 2 (two) times daily. 08/15/23  Yes Christobal Guadalajara, MD  insulin  aspart (NOVOLOG ) 100 UNIT/ML injection Inject 0-9 Units into the skin 3 (three) times daily with meals. Patient taking  differently: Inject 0-10 Units into the skin 3 (three) times daily with meals. 70-110 = 0 units 111-150 = 2 units 151-200 = 3 units 201-250 = 4 units 251-300 = 6 units 301-350 = 8 units 351-400 = 10 units 03/01/24  Yes Sigdel, Santosh, MD  insulin  glargine (LANTUS ) 100 UNIT/ML injection Inject 0.15 mLs (15 Units total) into the skin daily. 03/02/24  Yes Sigdel, Santosh, MD  mirabegron  ER (MYRBETRIQ ) 50 MG TB24 tablet Take 50 mg by mouth daily.   Yes [provider]  Multiple Vitamins-Minerals (PRESERVISION AREDS 2) CAPS Take 1 capsule by mouth 2 (two) times daily.    Yes [provider]  nystatin  (MYCOSTATIN /NYSTOP ) powder Apply topically 3 (three) times daily. 03/01/24  Yes Sigdel, Santosh, MD  potassium chloride  (KLOR-CON ) 10 MEQ tablet Take 10 mEq by mouth daily.   Yes [provider]  primidone (MYSOLINE) 50 MG tablet Take 150 mg by mouth 3 (three) times daily.   Yes [provider]  sertraline  (ZOLOFT ) 50 MG tablet Take 50 mg by mouth every evening.   Yes [provider]  tamsulosin  (FLOMAX ) 0.4 MG CAPS capsule Take 0.4 mg by mouth at bedtime.   Yes [provider]  timolol  (TIMOPTIC ) 0.5 % ophthalmic solution Place 1 drop into both eyes 2 (two) times daily. 09/22/17  Yes [provider]  cetirizine (ZYRTEC) 10 MG tablet Take 10 mg by mouth  daily.    [provider]  sacubitril -valsartan  (ENTRESTO ) 24-26 MG Take 1 tablet by mouth 2 (two) times daily.    [provider]    Allergies: Alendronate sodium, Penicillin g, Penicillins, Sulfa antibiotics, and Sulfacetamide sodium    Review of Systems  Updated Vital Signs BP (!) 129/52   Pulse 94   Temp (!) 100.8 F (38.2 C) (Rectal)   Resp (!) 21   Ht 6' (1.829 m)   Wt 68 kg   SpO2 100%   BMI 20.34 kg/m   Physical Exam Vitals and nursing note reviewed.  HENT:     Head: Atraumatic.  Eyes:     Comments: Pupils constricted bilaterally.  Cardiovascular:      Rate and Rhythm: Regular rhythm.  Chest:     Chest wall: No tenderness.  Abdominal:     Tenderness: There is no abdominal tenderness.  Musculoskeletal:     Cervical back: Neck supple.  Neurological:     Comments: Does have gag reflex intact.  Pupils constricted and not reactive.  Will respond both lower extremities to pain with some spontaneous movement of left lower extremity.  However minimal movement of upper extremities.  Did have some mild twitching to the left upper extremity with pain.     (all labs ordered are listed, but only abnormal results are displayed) Labs Reviewed  COMPREHENSIVE METABOLIC PANEL WITH GFR - Abnormal; Notable for the following components:      Result Value   Sodium 134 (*)    CO2 18 (*)    Glucose, Bld 148 (*)    Calcium  8.4 (*)    Albumin 2.2 (*)    ALT 65 (*)    Anion gap 17 (*)    All other components within normal limits  CBC WITH DIFFERENTIAL/PLATELET - Abnormal; Notable for the following components:   RBC 3.57 (*)    Hemoglobin 10.0 (*)    HCT 31.3 (*)    Platelets 435 (*)    Neutro Abs 8.2 (*)    Abs Immature Granulocytes 0.10 (*)    All other components within normal limits  PROTIME-INR - Abnormal; Notable for the following components:   Prothrombin Time 17.4 (*)    INR 1.4 (*)    All other components within normal limits  URINALYSIS, W/ REFLEX TO CULTURE (INFECTION SUSPECTED) - Abnormal; Notable for the following components:   Ketones, ur 5 (*)    Bacteria, UA RARE (*)    All other components within normal limits  CBG MONITORING, ED - Abnormal; Notable for the following components:   Glucose-Capillary 138 (*)    All other components within normal limits  RESP PANEL BY RT-PCR (RSV, FLU A&B, COVID)  [id number]  CULTURE, BLOOD (ROUTINE X 2)  CULTURE, BLOOD (ROUTINE X 2)  I-STAT CG4 LACTIC ACID, ED  I-STAT CG4 LACTIC ACID, ED    EKG: None  Radiology: CT HEAD WO CONTRAST ( ) Result Date: 04/07/2024 EXAM: CT HEAD WITHOUT CONTRAST  04/07/2024 12:36:44 PM TECHNIQUE: CT of the head was performed without the administration of intravenous contrast. Automated exposure control, iterative reconstruction, and/or weight based adjustment of the mA/kV was utilized to reduce the radiation dose to as low as reasonably achievable. COMPARISON: CT head 02/19/24. CLINICAL HISTORY: Mental status change, unknown cause. FINDINGS: BRAIN AND VENTRICLES: There is a 2.4 x 0.7 x 1.1 cm focus of hemorrhage within the posterior aspect of the right basal ganglia involving the caudate nucleus. There is decompression of hemorrhage into the  ventricular system with prominent areas of hyperattenuating clot/blood products within the lateral ventricles. Additional layering hemorrhage within the bilateral occipital horns. Hemorrhage also noted extending into and distending the third ventricle. There is hemorrhage within the cerebral aqueduct and fourth ventricle which also appears slightly distended. Hemorrhage noted extending through the foramen of Luschka. There is likely subarachnoid hemorrhage involving the right cerebellopontine angle cistern. There is hyperattenuating soft tissue at the foramen magnum which may reflect additional extra-axial blood products. There is increased caliber of the lateral ventricles compared to the prior study. Generalized parenchymal volume loss. Atherosclerosis of the carotid siphons. Bilateral lens replacement. No evidence of acute infarct. No mass effect or midline shift. ORBITS: Bilateral lens replacement. No acute abnormality. SINUSES: No acute abnormality. SOFT TISSUES AND SKULL: No acute soft tissue abnormality. No skull fracture. 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 IMPRESSION: 1. Hemorrhage centered within the right caudate with decompression into the ventricular system, resulting in prominent diffuse intraventricular hemorrhage and increased ventricular caliber compared to prior study. 2. Subarachnoid hemorrhage  involving the right cerebellopontine angle cistern. 3. Also likely additional extra-axial blood products at the foramen magnum. Electronically signed by: Donnice Mania MD 04/07/2024 12:53 PM EDT RP Workstation: [id number]   DG Chest Port 1 View Result Date: 04/07/2024 CLINICAL DATA:  Sepsis. EXAM: PORTABLE CHEST 1 VIEW COMPARISON:  02/19/2024 FINDINGS: Low volume film with stable asymmetric elevation of the right hemidiaphragm. Subtle patchy airspace disease is seen in the lung bases, likely atelectatic although pneumonia not excluded. The cardio pericardial silhouette is enlarged. Bones are diffusely demineralized. Telemetry leads overlie the chest. IMPRESSION: Low volume film with subtle patchy airspace disease in the lung bases, likely atelectatic although pneumonia not excluded. Electronically Signed   By: Camellia Candle M.D.   On: 04/07/2024 12:37     Procedures   Medications Ordered in the ED  lactated ringers  infusion ( Intravenous New Bag/Given 04/07/24 1218)  lactated ringers  bolus 1,000 mL (0 mLs Intravenous Stopped 04/07/24 1231)  ceFEPIme  (MAXIPIME ) 2 g in sodium chloride  0.9 % 100 mL IVPB (0 g Intravenous Stopped 04/07/24 1231)  metroNIDAZOLE (FLAGYL) IVPB 500 mg (0 mg Intravenous Stopped 04/07/24 1246)  vancomycin  (VANCOCIN ) IVPB 1000 mg/200 mL premix (1,000 mg Intravenous New Bag/Given 04/07/24 1218)                                    Medical Decision Making Amount and/or Complexity of Data Reviewed Labs: ordered. Radiology: ordered.  Risk Prescription drug management. Decision regarding hospitalization.   Patient with mental status change.  Differential diagnoses longed but includes causes such as sepsis, infection, intracranial hemorrhage.  With fever sepsis felt more likely.  Cultures drawn fluid bolus given and started with known source antibiotics.  Blood work overall reassuring however.  Head CT does show large intracranial hemorrhage.  Does have start in basal  ganglia but does get intraventricular.  Some developing hydrocephalus.  Discussed with neurosurgery who saw patient.  Likely not survivable.  Discussed with patient's daughter, [name].  Patient would not want extraordinary measures.  Would not want surgery.  Discussed with radiologist Dr. Mania.  Have put consult out for palliative care.  Will make patient comfort care and admit.  CRITICAL CARE Performed by: [place] Total critical care time: 30 minutes Critical care time was exclusive of separately billable procedures and treating other patients. Critical care was necessary to treat or prevent  imminent or life-threatening deterioration. Critical care was time spent personally by me on the following activities: development of treatment plan with patient and/or surrogate as well as nursing, discussions with consultants, evaluation of patient's response to treatment, examination of patient, obtaining history from patient or surrogate, ordering and performing treatments and interventions, ordering and review of laboratory studies, ordering and review of radiographic studies, pulse oximetry and re-evaluation of patient's condition.  Discussed with Dr.Bu from neurology.  Does not need to be involved.      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

## 2024-04-07 NOTE — ED Notes (Signed)
 Per CT MD notified of scan results, This RN secure messaged MD as well.

## 2024-04-07 NOTE — Progress Notes (Signed)
 Elink is following code sepsis.

## 2024-04-07 NOTE — ED Notes (Signed)
 Hospitalist at bedside, adjusted pt head to position of comfort.

## 2024-04-07 NOTE — Consult Note (Addendum)
 Providing Compassionate, Quality Care - Together   Reason for Consult: Intraventricular hemorrhage Referring Physician: Dr. Patsey Vaughan Matthew Calderon is an 89 y.o. male.  HPI: Matthew Calderon is an 89 year old male with a past medical history outlined below. He was found down at [facility] skilled nursing facility. The staff was unable to get him to awaken. His last known well was 10 pm yesterday evening. He was transported via EMS to [facility] ED, where CT imaging revealed a right caudate hemorrhage with intraventricular involvement. The patient is on Plavix . Neurosurgery was consulted for further evaluation and recommendations.  The patient is unresponsive. He has some purposeful movement in his LLE. He is currently protecting his airway. He is a DNR.  Past Medical History:  Diagnosis Date   Anemia    takes iron pill   Anxiety    Arthritis    Depression    Diabetes mellitus without complication (HCC)    GERD (gastroesophageal reflux disease)    pepto   H/O hiatal hernia    Pneumonia    4 years ago   PONV (postoperative nausea and vomiting) 05/14/2019   Post-nasal drip    Prostate hyperplasia, benign localized, without urinary obstruction    Seasonal allergies    Sleep apnea    no CPAP    Past Surgical History:  Procedure Laterality Date   BACK SURGERY  1995   discectomy   COLONOSCOPY W/ BIOPSIES AND POLYPECTOMY     EYE SURGERY Bilateral    cataracts   EYE SURGERY Bilateral    glaucoma shunts   JOINT REPLACEMENT Left 1992   knee   JOINT REPLACEMENT Right 1996   knee   JOINT REPLACEMENT Right 2009   hip   JOINT REPLACEMENT Left 2005   hip   KNEE ARTHROSCOPY Left 1981   KNEE ARTHROSCOPY WITH PATELLA RECONSTRUCTION Right 2010   KNEE SURGERY Bilateral 1954   knee surgery and placed in a cast for 6 weeks   KNEE SURGERY  1961   cartilage removed   LEG SURGERY Bilateral 1974, 1975   straighten legs   SHOULDER OPEN ROTATOR CUFF REPAIR Right 2010   TONSILLECTOMY      TOTAL HIP ARTHROPLASTY Right 01/13/2014   Procedure: TOTAL HIP ARTHROPLASTY ANTERIOR APPROACH;  Surgeon: Maude KANDICE Herald, MD;  Location: [facility] OR;  Service: Orthopedics;  Laterality: Right;    Family History  Problem Relation Age of Onset   Arthritis Mother    Arthritis Father     Social History:  reports that he quit smoking about 47 years ago. His smoking use included cigarettes. He started smoking about 65 years ago. He has a 18 pack-year smoking history. He has never used smokeless tobacco. He reports current alcohol use. He reports that he does not use drugs.  Allergies:  Allergies  Allergen Reactions   Alendronate Sodium     Other reaction(s): diarrhea   Penicillin G     Other reaction(s): Unknown   Penicillins Hives    DID THE REACTION INVOLVE: Swelling of the face/tongue/throat, SOB, or low BP? No Sudden or severe rash/hives, skin peeling, or the inside of the mouth or nose? No Did it require medical treatment? No When did it last happen?childhood allergy       If all above answers are "NO", may proceed with cephalosporin use.    Sulfa Antibiotics Hives   Sulfacetamide Sodium     Other reaction(s): Unknown    Medications: I have reviewed the  patient's current medications.  Results for orders placed or performed during the hospital encounter of 04/07/24 (from the past 48 hours)  CBG monitoring, ED     Status: Abnormal   Collection Time: 04/07/24 11:37 AM  Result Value Ref Range   Glucose-Capillary 138 (H) 70 - 99 mg/dL    Comment: Glucose reference range applies only to samples taken after fasting for at least 8 hours.  Comprehensive metabolic panel     Status: Abnormal   Collection Time: 04/07/24 11:45 AM  Result Value Ref Range   Sodium 134 (L) 135 - 145 mmol/L   Potassium 4.8 3.5 - 5.1 mmol/L   Chloride 99 98 - 111 mmol/L   CO2 18 (L) 22 - 32 mmol/L   Glucose, Bld 148 (H) 70 - 99 mg/dL    Comment: Glucose reference range applies only to samples taken after  fasting for at least 8 hours.   BUN 17 8 - 23 mg/dL   Creatinine, Ser 8.95 0.61 - 1.24 mg/dL   Calcium  8.4 (L) 8.9 - 10.3 mg/dL   Total Protein 6.8 6.5 - 8.1 g/dL   Albumin 2.2 (L) 3.5 - 5.0 g/dL   AST 35 15 - 41 U/L   ALT 65 (H) 0 - 44 U/L   Alkaline Phosphatase 85 38 - 126 U/L   Total Bilirubin 1.0 0.0 - 1.2 mg/dL   GFR, Estimated >39 >39 mL/min    Comment: (NOTE) Calculated using the CKD-EPI Creatinine Equation (2021)    Anion gap 17 (H) 5 - 15    Comment: Performed at [facility], [address] [place], [place] [address]  CBC with Differential     Status: Abnormal   Collection Time: 04/07/24 11:45 AM  Result Value Ref Range   WBC 9.8 4.0 - 10.5 K/uL   RBC 3.57 (L) 4.22 - 5.81 MIL/uL   Hemoglobin 10.0 (L) 13.0 - 17.0 g/dL   HCT 68.6 (L) 60.9 - 47.9 %   MCV 87.7 80.0 - 100.0 fL   MCH 28.0 26.0 - 34.0 pg   MCHC 31.9 30.0 - 36.0 g/dL   RDW 86.4 88.4 - 84.4 %   Platelets 435 (H) 150 - 400 K/uL   nRBC 0.0 0.0 - 0.2 %   Neutrophils Relative % 84 %   Neutro Abs 8.2 (H) 1.7 - 7.7 K/uL   Lymphocytes Relative 7 %   Lymphs Abs 0.7 0.7 - 4.0 K/uL   Monocytes Relative 5 %   Monocytes Absolute 0.5 0.1 - 1.0 K/uL   Eosinophils Relative 2 %   Eosinophils Absolute 0.2 0.0 - 0.5 K/uL   Basophils Relative 1 %   Basophils Absolute 0.1 0.0 - 0.1 K/uL   Immature Granulocytes 1 %   Abs Immature Granulocytes 0.10 (H) 0.00 - 0.07 K/uL    Comment: Performed at [facility], [address] [place], [place] [address]  Protime-INR     Status: Abnormal   Collection Time: 04/07/24 11:45 AM  Result Value Ref Range   Prothrombin Time 17.4 (H) 11.4 - 15.2 seconds   INR 1.4 (H) 0.8 - 1.2    Comment: (NOTE) INR goal varies based on device and disease states. Performed at [facility], [address], [place], [place] [address]   Urinalysis, w/ Reflex to Culture (Infection Suspected) -Urine, Unspecified Source     Status: Abnormal   Collection Time: 04/07/24 12:00 PM  Result  Value Ref Range   Specimen Source URINE,  UNSPE    Color, Urine YELLOW YELLOW   APPearance CLEAR CLEAR   Specific Gravity, Urine 1.014 1.005 - 1.030   pH 5.0 5.0 - 8.0   Glucose, UA NEGATIVE NEGATIVE mg/dL   Hgb urine dipstick NEGATIVE NEGATIVE   Bilirubin Urine NEGATIVE NEGATIVE   Ketones, ur 5 (A) NEGATIVE mg/dL   Protein, ur NEGATIVE NEGATIVE mg/dL   Nitrite NEGATIVE NEGATIVE   Leukocytes,Ua NEGATIVE NEGATIVE   RBC / HPF 0-5 0 - 5 RBC/hpf   WBC, UA 0-5 0 - 5 WBC/hpf    Comment:        Reflex urine culture not performed if WBC <=10, OR if Squamous epithelial cells >5. If Squamous epithelial cells >5 suggest recollection.    Bacteria, UA RARE (A) NONE SEEN   Squamous Epithelial / HPF 0-5 0 - 5 /HPF   Mucus PRESENT    Hyaline Casts, UA PRESENT     Comment: Performed at [facility], [address], [place], [place] [address]  I-Stat Lactic Acid, ED     Status: None   Collection Time: 04/07/24 12:11 PM  Result Value Ref Range   Lactic Acid, Venous 1.5 0.5 - 1.9 mmol/L    CT HEAD WO CONTRAST ( ) Result Date: 04/07/2024 EXAM: CT HEAD WITHOUT CONTRAST 04/07/2024 12:36:44 PM TECHNIQUE: CT of the head was performed without the administration of intravenous contrast. Automated exposure control, iterative reconstruction, and/or weight based adjustment of the mA/kV was utilized to reduce the radiation dose to as low as reasonably achievable. COMPARISON: CT head 02/19/24. CLINICAL HISTORY: Mental status change, unknown cause. FINDINGS: BRAIN AND VENTRICLES: There is a 2.4 x 0.7 x 1.1 cm focus of hemorrhage within the posterior aspect of the right basal ganglia involving the caudate nucleus. There is decompression of hemorrhage into the ventricular system with prominent areas of hyperattenuating clot/blood products within the lateral ventricles. Additional layering hemorrhage within the bilateral occipital horns. Hemorrhage also noted extending into and distending the third ventricle.  There is hemorrhage within the cerebral aqueduct and fourth ventricle which also appears slightly distended. Hemorrhage noted extending through the foramen of Luschka. There is likely subarachnoid hemorrhage involving the right cerebellopontine angle cistern. There is hyperattenuating soft tissue at the foramen magnum which may reflect additional extra-axial blood products. There is increased caliber of the lateral ventricles compared to the prior study. Generalized parenchymal volume loss. Atherosclerosis of the carotid siphons. Bilateral lens replacement. No evidence of acute infarct. No mass effect or midline shift. ORBITS: Bilateral lens replacement. No acute abnormality. SINUSES: No acute abnormality. SOFT TISSUES AND SKULL: No acute soft tissue abnormality. No skull fracture. 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 IMPRESSION: 1. Hemorrhage centered within the right caudate with decompression into the ventricular system, resulting in prominent diffuse intraventricular hemorrhage and increased ventricular caliber compared to prior study. 2. Subarachnoid hemorrhage involving the right cerebellopontine angle cistern. 3. Also likely additional extra-axial blood products at the foramen magnum. Electronically signed by: Donnice Mania MD 04/07/2024 12:53 PM EDT RP Workstation: [id number]   DG Chest Port 1 View Result Date: 04/07/2024 CLINICAL DATA:  Sepsis. EXAM: PORTABLE CHEST 1 VIEW COMPARISON:  02/19/2024 FINDINGS: Low volume film with stable asymmetric elevation of the right hemidiaphragm. Subtle patchy airspace disease is seen in the lung bases, likely atelectatic although pneumonia not excluded. The cardio pericardial silhouette is enlarged. Bones are diffusely demineralized. Telemetry leads overlie the chest. IMPRESSION: Low volume film with subtle patchy airspace disease in the lung bases,  likely atelectatic although pneumonia not excluded. Electronically Signed   By: Camellia Candle M.D.    On: 04/07/2024 12:37    Review of Systems  Unable to perform ROS: Patient unresponsive   Blood pressure (!) 129/52, pulse 94, temperature (!) 100.8 F (38.2 C), temperature source Rectal, resp. rate (!) 21, height 6' (1.829 m), weight 68 kg, SpO2 100%. Estimated body mass index is 20.34 kg/m as calculated from the following:   Height as of this encounter: 6' (1.829 m).   Weight as of this encounter: 68 kg.  Physical Exam Constitutional:      Appearance: Normal appearance.     Interventions: Nasal cannula in place.  HENT:     Head: Normocephalic and atraumatic.     Mouth/Throat:     Mouth: Mucous membranes are dry.  Eyes:     Pupils:     Right eye: Pupil is sluggish.     Left eye: Pupil is sluggish.  Cardiovascular:     Rate and Rhythm: Normal rate and regular rhythm.  Pulmonary:     Effort: Pulmonary effort is normal. No respiratory distress.  Abdominal:     Palpations: Abdomen is soft.  Skin:    General: Skin is warm and dry.     Capillary Refill: Capillary refill takes less than 2 seconds.  Neurological:     Mental Status: He is unresponsive.     GCS: GCS eye subscore is 1. GCS verbal subscore is 1. GCS motor subscore is 4.     Comments: Purposeful movement in LLE Withdraws to pain in remaining extremities     Assessment/Plan: Matthew Calderon has suffered a large intraventricular hemorrhage. His family has expressed that they would not want to put the patient through any surgical procedure for this and understand that the patient's condition will likely continue to decline. He will be admitted and made comfort care.  I am in communication with my attending and they agree with the plan for this patient.   Gerard Beck, DNP, AGNP-C Nurse Practitioner  [facility] [address], [address], [place], [place] [address] P: [phone]    F: [phone]  04/07/2024, 1:00 PM

## 2024-04-07 NOTE — Progress Notes (Signed)
 Pt arrived to 6 north room 15. Disorented x4. Heavy breathing. morphine  drip running at 2.5. no family at bedside. Only skin issues are redness on bottom, old healed surgical scars on both knees, scrape right shin area. Bruise right great toe. Bed in lowest position. Premo attached. All needs met at this time.

## 2024-04-07 NOTE — ED Notes (Signed)
 Pt transported to CT ?

## 2024-04-07 NOTE — ED Notes (Signed)
 Pt tolerated straight cath well, urine sample sent

## 2024-04-07 NOTE — ED Triage Notes (Signed)
 Pt arrived via EMS from [facility] CC lethargy and un arousalable. Pt DNR, LKW yesterday 2200. Hx DM

## 2024-04-07 NOTE — ED Notes (Signed)
 Report given to [facility], scope patch given to oncoming RN for application

## 2024-04-07 NOTE — Consult Note (Signed)
 Consultation Note Date: 04/07/2024   Patient Name: Matthew Calderon  DOB: 30-Apr-1933  MRN: [id number]  Age / Sex: 89 y.o., male  PCP: Caleen Dirks, MD Referring Physician: Claudene Maximino LABOR, MD  Reason for Consultation: Establishing goals of care and Terminal Care  HPI/Patient Profile: 89 y.o. male  with past medical history of CAD, CHF, DM2, HTN, HLD, OSA, disorder, recurrent falls  admitted on 04/07/2024 after being noted for lethargic and not arousable by staff.  Worth to note that CT scan of the head revealed  hemorrhage standard within the right caudate lobe decompression into the ventricle system resulting in diffuse intraventricular hemorrhage as well as subarachnoid hemorrhage involving the right cerebellopontine angle cistern.   Per HPI, phone conversation between patient's daughter and hospitalist confirms that they would not recommend any surgical intervention and that CODE STATUS was to remain DNR. They want to keep the patient comfortable and out of pain.   PMT has been consulted to assist with goals of care conversation. Patient/Family face treatment option decisions, advanced directive decisions and anticipatory care needs.   Family face treatment option decision, advance directive decisions and anticipatory care needs.   Clinical Assessment and Goals of Care:  I have reviewed medical records including EPIC notes, labs and imaging, assessed the patient and then reached out to daughter Matthew Calderon telephonically to discuss diagnosis prognosis, GOC, EOL wishes, disposition and options.  Visited patient at bedside in room ED room number 43. Patient appears to be comfortable lying in bed, unresponsive to verbal and tactile stimuli. His breathing is even and unlabored.   I introduced Palliative Medicine as specialized medical care for people living with serious illness. It focuses on providing relief from the symptoms and stress of a serious illness. The goal  is to improve quality of life for both the patient and the family.  I reviewed with Matthew reason of patient's current hospital stay, that patient was brought to the ED from [facility] due to unresponsiveness, later discovered on CT scan that he had large intraventricular hemorrhage. She noted that just days ago, staff at [facility] reported he was doing well. However, this morning she was informed that he had become unresponsive and less interactive, prompting her to request hospital evaluation. Neurosurgery was consulted, under the impression that patient's condition will likely continue to decline. Matthew confirmed with me that patient/family would not want extraordinary measures including surgery, and opted to pursue comfort-focused care. This is in agreement/consistent with earlier discussion between Attending MD and daughter.   I shared and discussed with Matthew what comfort-focused care is, that we are not doing life prolonging measures, instead we refocused our care to comforts, optimizing quality of life, optimizing symptom management, dignity and peace during terminal stage of life. I reviewed the comforts medications with Matthew. Matthew understands this and wants this approach for the patient. Matthew shared with me that [place] [facility] but  will be driving to come to see patient, with arrival at around 8 or 9 pm.  I explained that there are no visitation hour restrictions and that they can come visit patient any time. Matthew has limited time for discussion over the phone as they prepare to come here. I offered that I will be here tomorrow to meet with them, to support and to address any questions or concerns they may have, including discussion possibly about hospice disposition.   Created space and opportunity for family to explore thoughts and feelings regarding patient's  current medical condition.   Discussed the importance of continued conversation with family and  the medical providers regarding overall plan of care and treatment options, ensuring decisions are within the context of the patient's values and GOCs.   Questions and concerns were addressed. The family was encouraged to call with questions or concerns.  PMT will continue to support holistically.   Social History: Matthew spoke warmly about her stepfather, describing him as the sweetest and most caring person she has known. He married her mother in 69, and although not her biological father, he has always been treated as such due to his sincerity and kindness. His wife passed away in 2020/05/01. He previously lived in an independent living facility but experienced health setbacks, leading to his recent stay at [facility].  Functional and Nutritional State: As per Matthew, patient is ambulatory at baseline with the use of a walker. His appetite has been normal.   Palliative Symptom Altered mental status, unresponsiveness  Advance Directives: I reviewed advance directives, per our phone conversation, Matthew Mining engineer) mentioned that she is the [employer].    Code Status: DNR-Comfort  Primary Decision Maker: Matthew Calderon (step daughter)    SUMMARY OF RECOMMENDATIONS    Code Status: DNR Comfort Continue to provide psycho-social and emotional support to patient and family Palliative medicine team will continue to follow.  Comfort cart for family Unrestricted visitations in the setting of EOL (per policy)  Symptom Management:  Morphine  drip and PRN for pain/air hunger/comfort Robinul  PRN for excessive secretions Ativan PRN for agitation/anxiety Zofran  PRN for nausea Artificial tears PRN for dry eyes Haldol  PRN for agitation/anxiety May have comfort feeding Oxygen PRN 2L or less for comfort. No escalation.     Palliative Prophylaxis:  Aspiration, Bowel Regimen, Delirium Protocol, Eye Care, Oral Care, and Turn Reposition  Prognosis:  Prognosis is poor given large  intraventricular hemorrhage.   Discharge Planning: To Be Determined      Primary Diagnoses: Present on Admission: **None**  Physical Exam Constitutional:      Appearance: Normal appearance.     Interventions: Nasal cannula in place.     Head: Normocephalic and atraumatic.  Cardiovascular:     Rate and Rhythm: Tachycardia Pulmonary:     Effort: Pulmonary effort is normal. No respiratory distress.  Abdominal:     Palpations: Abdomen is soft.  Skin:    General: Skin is warm and dry.    Neurological:     Mental Status: He is unresponsive.   Vital Signs: BP (!) 155/80   Pulse (!) 103   Temp (!) 100.8 F (38.2 C) (Rectal)   Resp (!) 28   Ht 6' (1.829 m)   Wt 68 kg   SpO2 90%   BMI 20.34 kg/m  Pain Scale: Faces       SpO2: SpO2: 90 % O2 Device:SpO2: 90 % O2 Flow Rate: .    Palliative Assessment/Data: 10%    Total time: I spent 90 minutes in the care of the patient today in the above activities and documenting the encounter.   Detailed review of medical records (labs, imaging, vital signs), medically appropriate exam, discussed with treatment team, counseling and education to patient, family, & staff, documenting clinical information, coordination of care.     Kathlyne JULIANNA Tracie Mickey, NP  Palliative Medicine Team Team phone # [phone]  Thank you for allowing the Palliative Medicine Team to assist in the care of this patient. Please utilize secure chat with additional questions, if  there is no response within 30 minutes please call the above phone number.  Palliative Medicine Team providers are available by phone from 7am to 7pm daily and can be reached through the team cell phone.  Should this patient require assistance outside of these hours, please call the patient's attending physician.

## 2024-04-08 DIAGNOSIS — I615 Nontraumatic intracerebral hemorrhage, intraventricular: Secondary | ICD-10-CM | POA: Diagnosis not present

## 2024-04-08 DIAGNOSIS — I2510 Atherosclerotic heart disease of native coronary artery without angina pectoris: Secondary | ICD-10-CM | POA: Diagnosis not present

## 2024-04-08 DIAGNOSIS — Z515 Encounter for palliative care: Secondary | ICD-10-CM | POA: Diagnosis not present

## 2024-04-08 DIAGNOSIS — I619 Nontraumatic intracerebral hemorrhage, unspecified: Secondary | ICD-10-CM | POA: Diagnosis not present

## 2024-04-08 NOTE — Progress Notes (Signed)
 Patient on 3L O2 via simple mask. Patient appears comfortable on room air, O2 sats range between 87%-95% RA Prn Robinul  given IV

## 2024-04-08 NOTE — Care Management CC44 (Signed)
 Spoke with pt daughter , states that catheter does not need to be placed at this time. States that external catheter is working fine, will continue to monitor

## 2024-04-08 NOTE — Progress Notes (Signed)
 Palliative Medicine Inpatient Follow Up Note   HPI:  89 y.o. male  with past medical history of CAD, CHF, DM2, HTN, HLD, OSA, disorder, recurrent falls  admitted on 04/07/2024 after being noted for lethargic and not arousable by staff.  Worth to note that CT scan of the head revealed  hemorrhage standard within the right caudate lobe decompression into the ventricle system resulting in diffuse intraventricular hemorrhage as well as subarachnoid hemorrhage involving the right cerebellopontine angle cistern.      Today's Discussion 04/08/2024  *Please note that this is a verbal dictation therefore any spelling or grammatical errors are due to the Dragon Medical One system interpretation.  Chart reviewed inclusive of vital signs, progress notes, laboratory results, and diagnostic images.   Visited patient at bedside. No family members present. He is ill-appearing, patient was observed to have agonal respirations, consistent with the natural dying process.  No signs of distress were noted, and the patient appeared peaceful and unresponsive.  Shortly after the bedside visit, I contacted the patient's stepdaughter, Ginnie, by phone to provide a clinical update and offer emotional support. She shared that she and other family members were nearby, having lunch at [employer]. I informed her of the patient's current condition, explaining that based on my assessment, the patient appears to be actively transitioning. Clinical signs are consistent with the active dying phase, including altered level of consciousness, irregular breathing, and peripheral mottling. I gently conveyed that a hospital death is anticipated. Ginnie expressed appreciation for the update and the support provided. I encouraged her to cherish these remaining moments with the patient.  Created space and opportunity for patient to explore thoughts feelings and fears regarding current medical situation.  Questions and concerns addressed    Palliative Support Provided.   Objective Assessment: Vital Signs Vitals:   04/07/24 2254 04/08/24 0143  BP:    Pulse:    Resp:    Temp:  99.4 F (37.4 C)  SpO2: 97%     Intake/Output Summary (Last 24 hours) at 04/08/2024 0919 Last data filed at 04/07/2024 1726 Gross per 24 hour  Intake 728.79 ml  Output --  Net 728.79 ml   Last Weight  Most recent update: 04/07/2024 11:25 AM    Weight  68 kg (150 lb)             Physical Exam Constitutional:      Appearance: Normal appearance.     Interventions: Nasal cannula in place.     Head: Normocephalic and atraumatic.  Cardiovascular:     Rate and Rhythm: Tachycardia Pulmonary:     Effort: agonal Abdominal:     Palpations: Abdomen is soft.  Skin:    General: Skin is warm and dry.    Neurological:     Mental Status: He is unresponsive.   SUMMARY OF RECOMMENDATIONS   Code Status: DNR Comfort Anticipate hospital death Continue to provide psycho-social and emotional support to patient and family Palliative medicine team will continue to follow.  Comfort cart for family Unrestricted visitations in the setting of EOL (per policy)   Symptom Management:   Morphine  drip and PRN for pain/air hunger/comfort Robinul  PRN for excessive secretions Ativan PRN for agitation/anxiety Zofran  PRN for nausea Artificial tears PRN for dry eyes Haldol  PRN for agitation/anxiety May have comfort feeding Oxygen PRN 2L or less for comfort. No escalation.   Time Spent: 50 minutes  Detailed review of medical records (labs, imaging, vital signs), medically appropriate exam, discussed with treatment team,  counseling and education to patient, family, & staff, documenting clinical information, coordination of care.   ________________________________________________________________________ Kathlyne Bolder NP-C [place] Palliative Medicine Team Team Cell Phone: [phone] Please utilize secure chat with additional questions, if  there is no response within 30 minutes please call the above phone number  Palliative Medicine Team providers are available by phone from 7am to 7pm daily and can be reached through the team cell phone.  Should this patient require assistance outside of these hours, please call the patient's attending physician.

## 2024-04-08 NOTE — Progress Notes (Signed)
 PROGRESS NOTE    Matthew Calderon  FMW:[id number] DOB: 1933/01/28 DOA: 04/07/2024 PCP: Caleen Dirks, MD    Brief Narrative:  Matthew Calderon is a 89 y.o. male with medical history significant of CAD, CHF, DM2, HTN, HLD, OSA, disorder, recurrent falls comes to the ED from [facility] after being noted to be lethargic and not arousable by staff.  Patient is unable to give any history as he is nonresponsive and is obtained from review of records.   Last known well was around 10 PM last night.  Patient patient is on Plavix .  Patient was only noted to be moving the left leg and nonverbal.  No report of any fall.   Patient had just recently been hospitalized after presenting from a ground-level fall found to have a type II dens fracture the day before from 8/28-9/8 found to be septic secondary to pneumonia.  During hospitalization also was treated for euglycemic diabetic ketoacidosis thought secondary to Milner.  Ultimately he was able to be discharged back to [facility].   Assessment and Plan: ntraventricular hemorrhage Subarachnoid hemorrhage Unresponsiveness Heart failure with improved ejection fraction Paroxysmal atrial fibrillation CAD Hypertension Hyperlipidemia Diabetes mellitus type 2 OSA Patient presented after being noted to be acutely altered and unresponsive.  On physical exam noted to be moving left lower extremity spontaneously and all other extremities only to noxious stimuli.  CT scan of the head noted large intraventricular hemorrhage.  Family confirmed DNR status.  Neurosurgery have been consulted, but no surgical intervention was agreed upon for which comfort measures were recommended to keep the patient comfortable. # Would anticipate in-hospital death   DVT prophylaxis:     Code Status: Do not attempt resuscitation (DNR) - Comfort care Family Communication: Daughter at bedside  Disposition Plan:  Level of care: Palliative Care Status is: Inpatient     Consultants:   Neurosurgery Palliative care   Subjective: Appears comfortable  Objective: Vitals:   04/07/24 2200 04/07/24 2254 04/08/24 0143 04/08/24 1046  BP:      Pulse:      Resp:    10  Temp:   99.4 F (37.4 C)   TempSrc:   Axillary   SpO2: (!) 85% 97%  91%  Weight:      Height:        Intake/Output Summary (Last 24 hours) at 04/08/2024 1249 Last data filed at 04/07/2024 1726 Gross per 24 hour  Intake 728.79 ml  Output --  Net 728.79 ml   Filed Weights   04/07/24 1125  Weight: 68 kg    Examination:   In bed, appears comfortable, coarse lung sounds   Data Reviewed: I have personally reviewed following labs and imaging studies  CBC: Recent Labs  Lab 04/07/24 1145  WBC 9.8  NEUTROABS 8.2*  HGB 10.0*  HCT 31.3*  MCV 87.7  PLT 435*   Basic Metabolic Panel: Recent Labs  Lab 04/07/24 1145  NA 134*  K 4.8  CL 99  CO2 18*  GLUCOSE 148*  BUN 17  CREATININE 1.04  CALCIUM  8.4*   GFR: Estimated Creatinine Clearance: 46.3 mL/min (by C-G formula based on SCr of 1.04 mg/dL). Liver Function Tests: Recent Labs  Lab 04/07/24 1145  AST 35  ALT 65*  ALKPHOS 85  BILITOT 1.0  PROT 6.8  ALBUMIN 2.2*   No results for input(s): LIPASE, AMYLASE in the last 168 hours. No results for input(s): AMMONIA in the last 168 hours. Coagulation Profile: Recent Labs  Lab 04/07/24 1145  INR 1.4*   Cardiac Enzymes: No results for input(s): CKTOTAL, CKMB, CKMBINDEX, TROPONINI in the last 168 hours. BNP (last 3 results) No results for input(s): PROBNP in the last 8760 hours. HbA1C: No results for input(s): HGBA1C in the last 72 hours. CBG: Recent Labs  Lab 04/07/24 1137  GLUCAP 138*   Lipid Profile: No results for input(s): CHOL, HDL, LDLCALC, TRIG, CHOLHDL, LDLDIRECT in the last 72 hours. Thyroid  Function Tests: No results for input(s): TSH, T4TOTAL, FREET4, T3FREE, THYROIDAB in the last 72 hours. Anemia Panel: No results  for input(s): VITAMINB12, FOLATE, FERRITIN, TIBC, IRON, RETICCTPCT in the last 72 hours. Sepsis Labs: Recent Labs  Lab 04/07/24 1211  LATICACIDVEN 1.5    Recent Results (from the past 240 hours)  Resp panel by RT-PCR (RSV, Flu A&B, Covid) Anterior Nasal Swab     Status: None   Collection Time: 04/07/24 11:37 AM   Specimen: Anterior Nasal Swab  Result Value Ref Range Status   SARS Coronavirus 2 by RT PCR NEGATIVE NEGATIVE Final   Influenza A by PCR NEGATIVE NEGATIVE Final   Influenza B by PCR NEGATIVE NEGATIVE Final    Comment: (NOTE) The Xpert Xpress SARS-CoV-2/FLU/RSV plus assay is intended as an aid in the diagnosis of influenza from Nasopharyngeal swab specimens and should not be used as a sole basis for treatment. Nasal washings and aspirates are unacceptable for Xpert Xpress SARS-CoV-2/FLU/RSV testing.  Fact Sheet for Patients: BloggerCourse.com  Fact Sheet for Healthcare Providers: SeriousBroker.it  This test is not yet approved or cleared by the [place]  FDA and has been authorized for detection and/or diagnosis of SARS-CoV-2 by FDA under an Emergency Use Authorization (EUA). This EUA will remain in effect (meaning this test can be used) for the duration of the COVID-19 declaration under Section 564(b)(1) of the Act, 21 U.S.C. section 360bbb-3(b)(1), unless the authorization is terminated or revoked.     Resp Syncytial Virus by PCR NEGATIVE NEGATIVE Final    Comment: (NOTE) Fact Sheet for Patients: BloggerCourse.com  Fact Sheet for Healthcare Providers: SeriousBroker.it  This test is not yet approved or cleared by the [place]  FDA and has been authorized for detection and/or diagnosis of SARS-CoV-2 by FDA under an Emergency Use Authorization (EUA). This EUA will remain in effect (meaning this test can be used) for the duration of the COVID-19  declaration under Section 564(b)(1) of the Act, 21 U.S.C. section 360bbb-3(b)(1), unless the authorization is terminated or revoked.  Performed at [facility], [address], [place], [place] [address]   Blood Culture (routine x 2)     Status: None (Preliminary result)   Collection Time: 04/07/24 11:50 AM   Specimen: BLOOD  Result Value Ref Range Status   Specimen Description BLOOD RIGHT ANTECUBITAL  Final   Special Requests   Final    BOTTLES DRAWN AEROBIC AND ANAEROBIC Blood Culture adequate volume   Culture   Final    NO GROWTH < 24 HOURS Performed at [facility], [address] [place], [place] [address]    Report Status PENDING  Incomplete  Blood Culture (routine x 2)     Status: None (Preliminary result)   Collection Time: 04/07/24  1:17 PM   Specimen: BLOOD RIGHT HAND  Result Value Ref Range Status   Specimen Description BLOOD RIGHT HAND  Final   Special Requests   Final    BOTTLES DRAWN AEROBIC AND ANAEROBIC Blood Culture results may not be optimal due to an inadequate volume  of blood received in culture bottles   Culture   Final    NO GROWTH < 24 HOURS Performed at [facility], [address], [place], [place] [address]    Report Status PENDING  Incomplete         Radiology Studies: CT HEAD WO CONTRAST ( ) Addendum Date: 04/07/2024 ADDENDUM #1ADDENDUM: Findings discussed with Dr. Patsey at 12:59PM on 04/07/24. ---------------------------------------------------- Electronically signed by: Donnice Mania MD 04/07/2024 05:30 PM EDT RP Workstation: [id number]   Result Date: 04/07/2024  ORIGINAL REPORT EXAM: CT HEAD WITHOUT CONTRAST 04/07/2024 12:36:44 PM TECHNIQUE: CT of the head was performed without the administration of intravenous contrast. Automated exposure control, iterative reconstruction, and/or weight based adjustment of the mA/kV was utilized to reduce the radiation dose to as low as reasonably achievable. COMPARISON: CT head  02/19/24. CLINICAL HISTORY: Mental status change, unknown cause. FINDINGS: BRAIN AND VENTRICLES: There is a 2.4 x 0.7 x 1.1 cm focus of hemorrhage within the posterior aspect of the right basal ganglia involving the caudate nucleus. There is decompression of hemorrhage into the ventricular system with prominent areas of hyperattenuating clot/blood products within the lateral ventricles. Additional layering hemorrhage within the bilateral occipital horns. Hemorrhage also noted extending into and distending the third ventricle. There is hemorrhage within the cerebral aqueduct and fourth ventricle which also appears slightly distended. Hemorrhage noted extending through the foramen of Luschka. There is likely subarachnoid hemorrhage involving the right cerebellopontine angle cistern. There is hyperattenuating soft tissue at the foramen magnum which may reflect additional extra-axial blood products. There is increased caliber of the lateral ventricles compared to the prior study. Generalized parenchymal volume loss. Atherosclerosis of the carotid siphons. Bilateral lens replacement. No evidence of acute infarct. No mass effect or midline shift. ORBITS: Bilateral lens replacement. No acute abnormality. SINUSES: No acute abnormality. SOFT TISSUES AND SKULL: No acute soft tissue abnormality. No skull fracture. 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 IMPRESSION: 1. Hemorrhage centered within the right caudate with decompression into the ventricular system, resulting in prominent diffuse intraventricular hemorrhage and increased ventricular caliber compared to prior study. 2. Subarachnoid hemorrhage involving the right cerebellopontine angle cistern. 3. Also likely additional extra-axial blood products at the foramen magnum. Electronically signed by: Donnice Mania MD 04/07/2024 12:53 PM EDT RP Workstation: [id number]   DG Chest Port 1 View Result Date: 04/07/2024 CLINICAL DATA:  Sepsis. EXAM: PORTABLE CHEST  1 VIEW COMPARISON:  02/19/2024 FINDINGS: Low volume film with stable asymmetric elevation of the right hemidiaphragm. Subtle patchy airspace disease is seen in the lung bases, likely atelectatic although pneumonia not excluded. The cardio pericardial silhouette is enlarged. Bones are diffusely demineralized. Telemetry leads overlie the chest. IMPRESSION: Low volume film with subtle patchy airspace disease in the lung bases, likely atelectatic although pneumonia not excluded. Electronically Signed   By: Camellia Candle M.D.   On: 04/07/2024 12:37        Scheduled Meds:  scopolamine  1 patch Transdermal Q72H   Continuous Infusions:  morphine  3.75 mg/hr (04/08/24 0129)     LOS: 1 day    Time spent: 45 minutes spent on chart review, discussion with nursing staff, consultants, updating family and interview/physical exam; more than 50% of that time was spent in counseling and/or coordination of care.    Harlene RAYMOND Bowl, DO [facility] Available via Epic secure chat 7am-7pm After these hours, please refer to coverage provider listed on [email or web address] 04/08/2024, 12:49 PM

## 2024-04-08 NOTE — Plan of Care (Signed)
 Patient on comfort care. IV morphine  infusing.  Problem: Clinical Measurements: Goal: Quality of life will improve Outcome: Not Applicable

## 2024-04-08 NOTE — Plan of Care (Signed)
  Problem: Education: Goal: Knowledge of the prescribed therapeutic regimen will improve Outcome: Not Progressing   Problem: Coping: Goal: Ability to identify and develop effective coping behavior will improve Outcome: Not Progressing   Problem: Clinical Measurements: Goal: Quality of life will improve Outcome: Not Progressing   Problem: Respiratory: Goal: Verbalizations of increased ease of respirations will increase Outcome: Not Progressing   Problem: Role Relationship: Goal: Family's ability to cope with current situation will improve Outcome: Not Progressing Goal: Ability to verbalize concerns, feelings, and thoughts to partner or family member will improve Outcome: Not Progressing   Problem: Pain Management: Goal: Satisfaction with pain management regimen will improve Outcome: Not Progressing   Problem: Education: Goal: Knowledge of General Education information will improve Description: Including pain rating scale, medication(s)/side effects and non-pharmacologic comfort measures Outcome: Not Progressing   Problem: Health Behavior/Discharge Planning: Goal: Ability to manage health-related needs will improve Outcome: Not Progressing   Problem: Clinical Measurements: Goal: Ability to maintain clinical measurements within normal limits will improve Outcome: Not Progressing Goal: Will remain free from infection Outcome: Not Progressing Goal: Diagnostic test results will improve Outcome: Not Progressing Goal: Respiratory complications will improve Outcome: Not Progressing Goal: Cardiovascular complication will be avoided Outcome: Not Progressing   Problem: Activity: Goal: Risk for activity intolerance will decrease Outcome: Not Progressing   Problem: Nutrition: Goal: Adequate nutrition will be maintained Outcome: Not Progressing   Problem: Coping: Goal: Level of anxiety will decrease Outcome: Not Progressing   Problem: Elimination: Goal: Will not  experience complications related to bowel motility Outcome: Not Progressing Goal: Will not experience complications related to urinary retention Outcome: Not Progressing   Problem: Pain Managment: Goal: General experience of comfort will improve and/or be controlled Outcome: Not Progressing   Problem: Safety: Goal: Ability to remain free from injury will improve Outcome: Not Progressing   Problem: Skin Integrity: Goal: Risk for impaired skin integrity will decrease Outcome: Not Progressing

## 2024-04-09 DIAGNOSIS — Z515 Encounter for palliative care: Secondary | ICD-10-CM | POA: Diagnosis not present

## 2024-04-09 DIAGNOSIS — Z789 Other specified health status: Secondary | ICD-10-CM

## 2024-04-09 DIAGNOSIS — I615 Nontraumatic intracerebral hemorrhage, intraventricular: Secondary | ICD-10-CM | POA: Diagnosis not present

## 2024-04-09 DIAGNOSIS — I619 Nontraumatic intracerebral hemorrhage, unspecified: Secondary | ICD-10-CM | POA: Diagnosis not present

## 2024-04-09 DIAGNOSIS — Z7189 Other specified counseling: Secondary | ICD-10-CM | POA: Diagnosis not present

## 2024-04-09 MED ORDER — GLYCOPYRROLATE 0.2 MG/ML IJ SOLN
0.3000 mg | INTRAMUSCULAR | Status: DC
  Administered 2024-04-09 – 2024-04-12 (×20): 0.3 mg via INTRAVENOUS
  Filled 2024-04-09 (×21): qty 2

## 2024-04-09 MED ORDER — GLYCOPYRROLATE 0.2 MG/ML IJ SOLN
0.2000 mg | INTRAMUSCULAR | Status: DC
  Administered 2024-04-09 (×2): 0.2 mg via INTRAVENOUS
  Filled 2024-04-09: qty 1

## 2024-04-09 NOTE — Plan of Care (Signed)

## 2024-04-09 NOTE — Plan of Care (Signed)
  Problem: Pain Managment: Goal: General experience of comfort will improve and/or be controlled Outcome: Progressing   Problem: Safety: Goal: Ability to remain free from injury will improve Outcome: Progressing

## 2024-04-09 NOTE — Progress Notes (Signed)
 Daily Progress Note   Date: 04/09/2024   Patient Name: Matthew Calderon  DOB: 12-14-1932  MRN: [id number]  Age / Sex: 89 y.o., male  Attending Physician: Juvenal Harlene PENNER, DO Primary Care Physician: Caleen Dirks, MD Admit Date: 04/07/2024 Length of Stay: 2 days  Reason for Follow-up: Non pain symptom management, Pain control, Psychosocial/spiritual support, and Terminal Care  Past Medical History:  Diagnosis Date   Anemia    takes iron pill   Anxiety    Arthritis    Depression    Diabetes mellitus without complication (HCC)    GERD (gastroesophageal reflux disease)    pepto   H/O hiatal hernia    Pneumonia    4 years ago   PONV (postoperative nausea and vomiting) 05/14/2019   Post-nasal drip    Prostate hyperplasia, benign localized, without urinary obstruction    Seasonal allergies    Sleep apnea    no CPAP    Subjective:   Subjective: Chart Reviewed. Updates received. Patient Assessed. Created space and opportunity for patient  and family to explore thoughts and feelings regarding current medical situation.  Today's Discussion: Today before meeting with the patient/family, I reviewed the chart notes including palliative medicine note from yesterday, nursing note from yesterday, hospitalist note from yesterday, hospitalist note from today. I also reviewed vital signs, nursing flowsheets, and medication administrations record. No labs due to comfort care status.  Vital signs today include temperature of 101.6, heart rate 126, respiratory rate 10, blood pressure 84/48, satting 88% on 2 L/min.  Comfort medications administered include as needed Robinul  4 times yesterday, converted to scheduled Robinul  0.2 mg every 4 hours first given at 8:36 AM today.  Patient is also on a morphine  drip currently at 3.75 mg/h as well as scopolamine patch.  Will continue parenteral opioids due to need thus far and anticipated continued need.  Today saw the patient at bedside, no family is  present.  Respirations are unlabored and sonorous and sonorous, some irregularity with brief apneic pauses.  Respiratory rate counted by hands at 8/min currently.  No objective signs of distress or discomfort including no furrowing of the bowel, grimacing, groaning, excessive movement/fidgeting.  Anticipate the patient is now actively dying.  Review of Systems  Unable to perform ROS   Objective:   Primary Diagnoses: Present on Admission:  Unresponsiveness  Heart failure with improved ejection fraction (HFimpEF) (HCC)  Paroxysmal atrial fibrillation (HCC)  Essential hypertension  Coronary artery calcification  OSA (obstructive sleep apnea)  HLD (hyperlipidemia)   Vital Signs:  BP (!) 84/48 (BP Location: Left Wrist)   Pulse (!) 126   Temp (!) 101.6 F (38.7 C)   Resp 10   Ht 6' (1.829 m)   Wt 68 kg   SpO2 (!) 88%   BMI 20.34 kg/m   Physical Exam Vitals and nursing note reviewed.  Constitutional:      General: He is sleeping. He is not in acute distress. HENT:     Head: Normocephalic and atraumatic.  Cardiovascular:     Rate and Rhythm: Tachycardia present.  Pulmonary:     Effort: Pulmonary effort is normal. No respiratory distress.     Comments: Noted mild terminal secretions despite Robinul  and scopolamine Abdominal:     General: Abdomen is flat. There is no distension.     Palpations: Abdomen is soft.  Skin:    General: Skin is warm and dry.     Palliative Assessment/Data: 10%   Existing Vynca/ACP Documentation: None  Assessment & Plan:   HPI/Patient Profile:  89 y.o. male  with past medical history of CAD, CHF, DM2, HTN, HLD, OSA, disorder, recurrent falls  admitted on 04/07/2024 after being noted for lethargic and not arousable by staff.  Worth to note that CT scan of the head revealed  hemorrhage standard within the right caudate lobe decompression into the ventricle system resulting in diffuse intraventricular hemorrhage as well as subarachnoid hemorrhage  involving the right cerebellopontine angle cistern.    Per HPI, phone conversation between patient's daughter and hospitalist confirms that they would not recommend any surgical intervention and that CODE STATUS was to remain DNR. They want to keep the patient comfortable and out of pain.    PMT has been consulted to assist with goals of care conversation. Patient/Family face treatment option decisions, advanced directive decisions and anticipatory care needs.    Family face treatment option decision, advance directive decisions and anticipatory care needs.   SUMMARY OF RECOMMENDATIONS   DNR-comfort Continue comfort care She symptom management orders below Palliative medicine will continue to follow  Symptom Management:  Morphine  drip and PRN for pain/air hunger/comfort INCREASED Robinul  to 0.3 mg q 4 hr for excessive secretions Ativan PRN for agitation/anxiety Zofran  PRN for nausea Artificial tears PRN for dry eyes Haldol  PRN for agitation/anxiety May have comfort feeding Oxygen PRN 2L or less for comfort. No escalation  Code Status: DNR - Comfort  Prognosis: Hours - Days  Discharge Planning: Anticipated Hospital Death  Discussed with: Medical team, nursing team  Thank you for allowing us  to participate in the care of AMARU BURROUGHS PMT will continue to support holistically.  Billing based on MDM: High  Problems Addressed: One acute or chronic illness or injury that poses a threat to life or bodily function  Risks: Parenteral controlled substances  Detailed review of medical records (labs, imaging, vital signs), medically appropriate exam, discussed with treatment team, counseling and education to patient, family, & staff, documenting clinical information, medication management, coordination of care  Camellia Kays, NP Palliative Medicine Team  Team Phone # [phone] (Nights/Weekends)  02/20/2021, 8:17 AM

## 2024-04-09 NOTE — Progress Notes (Signed)
 PROGRESS NOTE    Matthew Calderon  FMW:[id number] DOB: 08-May-1933 DOA: 04/07/2024 PCP: Caleen Dirks, MD    Brief Narrative:  Matthew Calderon is a 89 y.o. male with medical history significant of CAD, CHF, DM2, HTN, HLD, OSA, disorder, recurrent falls comes to the ED from [facility] after being noted to be lethargic and not arousable by staff.  Patient is unable to give any history as he is nonresponsive and is obtained from review of records.   Last known well was around 10 PM last night.  Patient patient is on Plavix .  Patient was only noted to be moving the left leg and nonverbal.  No report of any fall.   Patient had just recently been hospitalized after presenting from a ground-level fall found to have a type II dens fracture the day before from 8/28-9/8 found to be septic secondary to pneumonia.  During hospitalization also was treated for euglycemic diabetic ketoacidosis thought secondary to [place].  Ultimately he was able to be discharged back to [facility].   Assessment and Plan: ntraventricular hemorrhage Subarachnoid hemorrhage Unresponsiveness Heart failure with improved ejection fraction Paroxysmal atrial fibrillation CAD Hypertension Hyperlipidemia Diabetes mellitus type 2 OSA Patient presented after being noted to be acutely altered and unresponsive.  On physical exam noted to be moving left lower extremity spontaneously and all other extremities only to noxious stimuli.  CT scan of the head noted large intraventricular hemorrhage.  Family confirmed DNR status.  Neurosurgery have been consulted, but no surgical intervention was agreed upon for which comfort measures were recommended to keep the patient comfortable. # Would anticipate in-hospital death   DVT prophylaxis:     Code Status: Do not attempt resuscitation (DNR) - Comfort care Family Communication: Called son-in-law  Disposition Plan:  Level of care: Palliative Care Status is: Inpatient     Consultants:   Neurosurgery Palliative care   Subjective: Appears comfortable  Objective: Vitals:   04/08/24 2151 04/09/24 0300 04/09/24 0514 04/09/24 0844  BP: (!) 102/51   (!) 84/48  Pulse: (!) 112   (!) 126  Resp: 16  16 10   Temp: 97.6 F (36.4 C)   (!) 101.6 F (38.7 C)  TempSrc: Oral     SpO2: 90% 90% 91% (!) 88%  Weight:      Height:       No intake or output data in the 24 hours ending 04/09/24 1226  Filed Weights   04/07/24 1125  Weight: 68 kg    Examination:   In bed, appears comfortable, some upper airway sounds   Data Reviewed: I have personally reviewed following labs and imaging studies  CBC: Recent Labs  Lab 04/07/24 1145  WBC 9.8  NEUTROABS 8.2*  HGB 10.0*  HCT 31.3*  MCV 87.7  PLT 435*   Basic Metabolic Panel: Recent Labs  Lab 04/07/24 1145  NA 134*  K 4.8  CL 99  CO2 18*  GLUCOSE 148*  BUN 17  CREATININE 1.04  CALCIUM  8.4*   GFR: Estimated Creatinine Clearance: 46.3 mL/min (by C-G formula based on SCr of 1.04 mg/dL). Liver Function Tests: Recent Labs  Lab 04/07/24 1145  AST 35  ALT 65*  ALKPHOS 85  BILITOT 1.0  PROT 6.8  ALBUMIN 2.2*   No results for input(s): LIPASE, AMYLASE in the last 168 hours. No results for input(s): AMMONIA in the last 168 hours. Coagulation Profile: Recent Labs  Lab 04/07/24 1145  INR 1.4*   Cardiac Enzymes: No results for input(s): CKTOTAL,  CKMB, CKMBINDEX, TROPONINI in the last 168 hours. BNP (last 3 results) No results for input(s): PROBNP in the last 8760 hours. HbA1C: No results for input(s): HGBA1C in the last 72 hours. CBG: Recent Labs  Lab 04/07/24 1137  GLUCAP 138*   Lipid Profile: No results for input(s): CHOL, HDL, LDLCALC, TRIG, CHOLHDL, LDLDIRECT in the last 72 hours. Thyroid  Function Tests: No results for input(s): TSH, T4TOTAL, FREET4, T3FREE, THYROIDAB in the last 72 hours. Anemia Panel: No results for input(s): VITAMINB12, FOLATE,  FERRITIN, TIBC, IRON, RETICCTPCT in the last 72 hours. Sepsis Labs: Recent Labs  Lab 04/07/24 1211  LATICACIDVEN 1.5    Recent Results (from the past 240 hours)  Resp panel by RT-PCR (RSV, Flu A&B, Covid) Anterior Nasal Swab     Status: None   Collection Time: 04/07/24 11:37 AM   Specimen: Anterior Nasal Swab  Result Value Ref Range Status   SARS Coronavirus 2 by RT PCR NEGATIVE NEGATIVE Final   Influenza A by PCR NEGATIVE NEGATIVE Final   Influenza B by PCR NEGATIVE NEGATIVE Final    Comment: (NOTE) The Xpert Xpress SARS-CoV-2/FLU/RSV plus assay is intended as an aid in the diagnosis of influenza from Nasopharyngeal swab specimens and should not be used as a sole basis for treatment. Nasal washings and aspirates are unacceptable for Xpert Xpress SARS-CoV-2/FLU/RSV testing.  Fact Sheet for Patients: BloggerCourse.com  Fact Sheet for Healthcare Providers: SeriousBroker.it  This test is not yet approved or cleared by the [place]  FDA and has been authorized for detection and/or diagnosis of SARS-CoV-2 by FDA under an Emergency Use Authorization (EUA). This EUA will remain in effect (meaning this test can be used) for the duration of the COVID-19 declaration under Section 564(b)(1) of the Act, 21 U.S.C. section 360bbb-3(b)(1), unless the authorization is terminated or revoked.     Resp Syncytial Virus by PCR NEGATIVE NEGATIVE Final    Comment: (NOTE) Fact Sheet for Patients: BloggerCourse.com  Fact Sheet for Healthcare Providers: SeriousBroker.it  This test is not yet approved or cleared by the [place]  FDA and has been authorized for detection and/or diagnosis of SARS-CoV-2 by FDA under an Emergency Use Authorization (EUA). This EUA will remain in effect (meaning this test can be used) for the duration of the COVID-19 declaration under Section 564(b)(1)  of the Act, 21 U.S.C. section 360bbb-3(b)(1), unless the authorization is terminated or revoked.  Performed at [facility], [address] [place], [place] [address]   Blood Culture (routine x 2)     Status: None (Preliminary result)   Collection Time: 04/07/24 11:50 AM   Specimen: BLOOD  Result Value Ref Range Status   Specimen Description BLOOD RIGHT ANTECUBITAL  Final   Special Requests   Final    BOTTLES DRAWN AEROBIC AND ANAEROBIC Blood Culture adequate volume   Culture   Final    NO GROWTH 2 DAYS Performed at [facility], [address], [place], [place] [address]    Report Status PENDING  Incomplete  Blood Culture (routine x 2)     Status: None (Preliminary result)   Collection Time: 04/07/24  1:17 PM   Specimen: BLOOD RIGHT HAND  Result Value Ref Range Status   Specimen Description BLOOD RIGHT HAND  Final   Special Requests   Final    BOTTLES DRAWN AEROBIC AND ANAEROBIC Blood Culture results may not be optimal due to an inadequate volume of blood received in culture bottles   Culture   Final  NO GROWTH 2 DAYS Performed at [facility], [address] [place], [place] [address]    Report Status PENDING  Incomplete         Radiology Studies: CT HEAD WO CONTRAST ( ) Addendum Date: 04/07/2024 ADDENDUM #1ADDENDUM: Findings discussed with Dr. Patsey at 12:59PM on 04/07/24. ---------------------------------------------------- Electronically signed by: Donnice Mania MD 04/07/2024 05:30 PM EDT RP Workstation: [id number]   Result Date: 04/07/2024  ORIGINAL REPORT EXAM: CT HEAD WITHOUT CONTRAST 04/07/2024 12:36:44 PM TECHNIQUE: CT of the head was performed without the administration of intravenous contrast. Automated exposure control, iterative reconstruction, and/or weight based adjustment of the mA/kV was utilized to reduce the radiation dose to as low as reasonably achievable. COMPARISON: CT head 02/19/24. CLINICAL HISTORY: Mental status change,  unknown cause. FINDINGS: BRAIN AND VENTRICLES: There is a 2.4 x 0.7 x 1.1 cm focus of hemorrhage within the posterior aspect of the right basal ganglia involving the caudate nucleus. There is decompression of hemorrhage into the ventricular system with prominent areas of hyperattenuating clot/blood products within the lateral ventricles. Additional layering hemorrhage within the bilateral occipital horns. Hemorrhage also noted extending into and distending the third ventricle. There is hemorrhage within the cerebral aqueduct and fourth ventricle which also appears slightly distended. Hemorrhage noted extending through the foramen of Luschka. There is likely subarachnoid hemorrhage involving the right cerebellopontine angle cistern. There is hyperattenuating soft tissue at the foramen magnum which may reflect additional extra-axial blood products. There is increased caliber of the lateral ventricles compared to the prior study. Generalized parenchymal volume loss. Atherosclerosis of the carotid siphons. Bilateral lens replacement. No evidence of acute infarct. No mass effect or midline shift. ORBITS: Bilateral lens replacement. No acute abnormality. SINUSES: No acute abnormality. SOFT TISSUES AND SKULL: No acute soft tissue abnormality. No skull fracture. 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 IMPRESSION: 1. Hemorrhage centered within the right caudate with decompression into the ventricular system, resulting in prominent diffuse intraventricular hemorrhage and increased ventricular caliber compared to prior study. 2. Subarachnoid hemorrhage involving the right cerebellopontine angle cistern. 3. Also likely additional extra-axial blood products at the foramen magnum. Electronically signed by: Donnice Mania MD 04/07/2024 12:53 PM EDT RP Workstation: [id number]   DG Chest Port 1 View Result Date: 04/07/2024 CLINICAL DATA:  Sepsis. EXAM: PORTABLE CHEST 1 VIEW COMPARISON:  02/19/2024 FINDINGS: Low  volume film with stable asymmetric elevation of the right hemidiaphragm. Subtle patchy airspace disease is seen in the lung bases, likely atelectatic although pneumonia not excluded. The cardio pericardial silhouette is enlarged. Bones are diffusely demineralized. Telemetry leads overlie the chest. IMPRESSION: Low volume film with subtle patchy airspace disease in the lung bases, likely atelectatic although pneumonia not excluded. Electronically Signed   By: Camellia Candle M.D.   On: 04/07/2024 12:37        Scheduled Meds:  glycopyrrolate   0.2 mg Intravenous Q4H   scopolamine  1 patch Transdermal Q72H   Continuous Infusions:  morphine  3.75 mg/hr (04/08/24 1702)     LOS: 2 days    Time spent: 45 minutes spent on chart review, discussion with nursing staff, consultants, updating family and interview/physical exam; more than 50% of that time was spent in counseling and/or coordination of care.    Harlene RAYMOND Bowl, DO [facility] Available via Epic secure chat 7am-7pm After these hours, please refer to coverage provider listed on [email or web address] 04/09/2024, 12:26 PM

## 2024-04-10 DIAGNOSIS — Z515 Encounter for palliative care: Secondary | ICD-10-CM | POA: Diagnosis not present

## 2024-04-10 DIAGNOSIS — Z66 Do not resuscitate: Secondary | ICD-10-CM | POA: Diagnosis not present

## 2024-04-10 DIAGNOSIS — I615 Nontraumatic intracerebral hemorrhage, intraventricular: Secondary | ICD-10-CM | POA: Diagnosis not present

## 2024-04-10 NOTE — Progress Notes (Signed)
 PROGRESS NOTE    KATRELL MILHORN  FMW:[id number] DOB: Apr 11, 1933 DOA: 04/07/2024 PCP: Caleen Dirks, MD    Brief Narrative:  Matthew Calderon is a 89 y.o. male with medical history significant of CAD, CHF, DM2, HTN, HLD, OSA, disorder, recurrent falls comes to the ED from [facility] after being noted to be lethargic and not arousable by staff.  Patient is unable to give any history as he is nonresponsive and is obtained from review of records.   Last known well was around 10 PM last night.  Patient patient is on Plavix .  Patient was only noted to be moving the left leg and nonverbal.  No report of any fall.   Patient had just recently been hospitalized after presenting from a ground-level fall found to have a type II dens fracture the day before from 8/28-9/8 found to be septic secondary to pneumonia.  During hospitalization also was treated for euglycemic diabetic ketoacidosis thought secondary to [place].  Ultimately he was able to be discharged back to [facility].   Assessment and Plan: ntraventricular hemorrhage Subarachnoid hemorrhage Unresponsiveness Heart failure with improved ejection fraction Paroxysmal atrial fibrillation CAD Hypertension Hyperlipidemia Diabetes mellitus type 2 OSA Patient presented after being noted to be acutely altered and unresponsive.  On physical exam noted to be moving left lower extremity spontaneously and all other extremities only to noxious stimuli.  CT scan of the head noted large intraventricular hemorrhage.  Family confirmed DNR status.  Neurosurgery have been consulted, but no surgical intervention was agreed upon for which comfort measures were recommended to keep the patient comfortable. # Would anticipate in-hospital death   DVT prophylaxis:     Code Status: Do not attempt resuscitation (DNR) - Comfort care Family Communication: Called daughter  Disposition Plan:  Level of care: Palliative Care Status is: Inpatient     Consultants:   Neurosurgery Palliative care   Subjective: Appears comfortable  Objective: Vitals:   04/09/24 0514 04/09/24 0844 04/09/24 1759 04/10/24 0449  BP:  (!) 84/48 (!) 93/47 (!) 94/51  Pulse:  (!) 126 (!) 110 (!) 116  Resp: 16 10 16 12   Temp:  (!) 101.6 F (38.7 C) 98.8 F (37.1 C) 97.7 F (36.5 C)  TempSrc:   Oral Oral  SpO2: 91% (!) 88% 91% 94%  Weight:      Height:        Intake/Output Summary (Last 24 hours) at 04/10/2024 1240 Last data filed at 04/10/2024 0356 Gross per 24 hour  Intake 211.02 ml  Output --  Net 211.02 ml    Filed Weights   04/07/24 1125  Weight: 68 kg    Examination:   In bed, appears comfortable, some upper airway sounds   Data Reviewed: I have personally reviewed following labs and imaging studies  CBC: Recent Labs  Lab 04/07/24 1145  WBC 9.8  NEUTROABS 8.2*  HGB 10.0*  HCT 31.3*  MCV 87.7  PLT 435*   Basic Metabolic Panel: Recent Labs  Lab 04/07/24 1145  NA 134*  K 4.8  CL 99  CO2 18*  GLUCOSE 148*  BUN 17  CREATININE 1.04  CALCIUM  8.4*   GFR: Estimated Creatinine Clearance: 46.3 mL/min (by C-G formula based on SCr of 1.04 mg/dL). Liver Function Tests: Recent Labs  Lab 04/07/24 1145  AST 35  ALT 65*  ALKPHOS 85  BILITOT 1.0  PROT 6.8  ALBUMIN 2.2*   No results for input(s): LIPASE, AMYLASE in the last 168 hours. No results for input(s): AMMONIA  in the last 168 hours. Coagulation Profile: Recent Labs  Lab 04/07/24 1145  INR 1.4*   Cardiac Enzymes: No results for input(s): CKTOTAL, CKMB, CKMBINDEX, TROPONINI in the last 168 hours. BNP (last 3 results) No results for input(s): PROBNP in the last 8760 hours. HbA1C: No results for input(s): HGBA1C in the last 72 hours. CBG: Recent Labs  Lab 04/07/24 1137  GLUCAP 138*   Lipid Profile: No results for input(s): CHOL, HDL, LDLCALC, TRIG, CHOLHDL, LDLDIRECT in the last 72 hours. Thyroid  Function Tests: No results for  input(s): TSH, T4TOTAL, FREET4, T3FREE, THYROIDAB in the last 72 hours. Anemia Panel: No results for input(s): VITAMINB12, FOLATE, FERRITIN, TIBC, IRON, RETICCTPCT in the last 72 hours. Sepsis Labs: Recent Labs  Lab 04/07/24 1211  LATICACIDVEN 1.5    Recent Results (from the past 240 hours)  Resp panel by RT-PCR (RSV, Flu A&B, Covid) Anterior Nasal Swab     Status: None   Collection Time: 04/07/24 11:37 AM   Specimen: Anterior Nasal Swab  Result Value Ref Range Status   SARS Coronavirus 2 by RT PCR NEGATIVE NEGATIVE Final   Influenza A by PCR NEGATIVE NEGATIVE Final   Influenza B by PCR NEGATIVE NEGATIVE Final    Comment: (NOTE) The Xpert Xpress SARS-CoV-2/FLU/RSV plus assay is intended as an aid in the diagnosis of influenza from Nasopharyngeal swab specimens and should not be used as a sole basis for treatment. Nasal washings and aspirates are unacceptable for Xpert Xpress SARS-CoV-2/FLU/RSV testing.  Fact Sheet for Patients: BloggerCourse.com  Fact Sheet for Healthcare Providers: SeriousBroker.it  This test is not yet approved or cleared by the [place]  FDA and has been authorized for detection and/or diagnosis of SARS-CoV-2 by FDA under an Emergency Use Authorization (EUA). This EUA will remain in effect (meaning this test can be used) for the duration of the COVID-19 declaration under Section 564(b)(1) of the Act, 21 U.S.C. section 360bbb-3(b)(1), unless the authorization is terminated or revoked.     Resp Syncytial Virus by PCR NEGATIVE NEGATIVE Final    Comment: (NOTE) Fact Sheet for Patients: BloggerCourse.com  Fact Sheet for Healthcare Providers: SeriousBroker.it  This test is not yet approved or cleared by the [place]  FDA and has been authorized for detection and/or diagnosis of SARS-CoV-2 by FDA under an Emergency Use  Authorization (EUA). This EUA will remain in effect (meaning this test can be used) for the duration of the COVID-19 declaration under Section 564(b)(1) of the Act, 21 U.S.C. section 360bbb-3(b)(1), unless the authorization is terminated or revoked.  Performed at [facility], [address], [place], [place] [address]   Blood Culture (routine x 2)     Status: None (Preliminary result)   Collection Time: 04/07/24 11:50 AM   Specimen: BLOOD  Result Value Ref Range Status   Specimen Description BLOOD RIGHT ANTECUBITAL  Final   Special Requests   Final    BOTTLES DRAWN AEROBIC AND ANAEROBIC Blood Culture adequate volume   Culture   Final    NO GROWTH 3 DAYS Performed at [facility], [address], [place], [place] [address]    Report Status PENDING  Incomplete  Blood Culture (routine x 2)     Status: None (Preliminary result)   Collection Time: 04/07/24  1:17 PM   Specimen: BLOOD RIGHT HAND  Result Value Ref Range Status   Specimen Description BLOOD RIGHT HAND  Final   Special Requests   Final    BOTTLES DRAWN AEROBIC AND  ANAEROBIC Blood Culture results may not be optimal due to an inadequate volume of blood received in culture bottles   Culture   Final    NO GROWTH 3 DAYS Performed at [facility], [address] [place], [place] [address]    Report Status PENDING  Incomplete         Radiology Studies: CT HEAD WO CONTRAST ( ) Addendum Date: 04/07/2024 ADDENDUM #1ADDENDUM: Findings discussed with Dr. Patsey at 12:59PM on 04/07/24. ---------------------------------------------------- Electronically signed by: Donnice Mania MD 04/07/2024 05:30 PM EDT RP Workstation: [id number]   Result Date: 04/07/2024  ORIGINAL REPORT EXAM: CT HEAD WITHOUT CONTRAST 04/07/2024 12:36:44 PM TECHNIQUE: CT of the head was performed without the administration of intravenous contrast. Automated exposure control, iterative reconstruction, and/or weight based adjustment of the  mA/kV was utilized to reduce the radiation dose to as low as reasonably achievable. COMPARISON: CT head 02/19/24. CLINICAL HISTORY: Mental status change, unknown cause. FINDINGS: BRAIN AND VENTRICLES: There is a 2.4 x 0.7 x 1.1 cm focus of hemorrhage within the posterior aspect of the right basal ganglia involving the caudate nucleus. There is decompression of hemorrhage into the ventricular system with prominent areas of hyperattenuating clot/blood products within the lateral ventricles. Additional layering hemorrhage within the bilateral occipital horns. Hemorrhage also noted extending into and distending the third ventricle. There is hemorrhage within the cerebral aqueduct and fourth ventricle which also appears slightly distended. Hemorrhage noted extending through the foramen of Luschka. There is likely subarachnoid hemorrhage involving the right cerebellopontine angle cistern. There is hyperattenuating soft tissue at the foramen magnum which may reflect additional extra-axial blood products. There is increased caliber of the lateral ventricles compared to the prior study. Generalized parenchymal volume loss. Atherosclerosis of the carotid siphons. Bilateral lens replacement. No evidence of acute infarct. No mass effect or midline shift. ORBITS: Bilateral lens replacement. No acute abnormality. SINUSES: No acute abnormality. SOFT TISSUES AND SKULL: No acute soft tissue abnormality. No skull fracture. 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 IMPRESSION: 1. Hemorrhage centered within the right caudate with decompression into the ventricular system, resulting in prominent diffuse intraventricular hemorrhage and increased ventricular caliber compared to prior study. 2. Subarachnoid hemorrhage involving the right cerebellopontine angle cistern. 3. Also likely additional extra-axial blood products at the foramen magnum. Electronically signed by: Donnice Mania MD 04/07/2024 12:53 PM EDT RP Workstation:  [id number]   DG Chest Port 1 View Result Date: 04/07/2024 CLINICAL DATA:  Sepsis. EXAM: PORTABLE CHEST 1 VIEW COMPARISON:  02/19/2024 FINDINGS: Low volume film with stable asymmetric elevation of the right hemidiaphragm. Subtle patchy airspace disease is seen in the lung bases, likely atelectatic although pneumonia not excluded. The cardio pericardial silhouette is enlarged. Bones are diffusely demineralized. Telemetry leads overlie the chest. IMPRESSION: Low volume film with subtle patchy airspace disease in the lung bases, likely atelectatic although pneumonia not excluded. Electronically Signed   By: Camellia Candle M.D.   On: 04/07/2024 12:37        Scheduled Meds:  glycopyrrolate   0.3 mg Intravenous Q4H   scopolamine  1 patch Transdermal Q72H   Continuous Infusions:  morphine  3.8 mg/hr (04/10/24 0356)     LOS: 3 days    Time spent: 45 minutes spent on chart review, discussion with nursing staff, consultants, updating family and interview/physical exam; more than 50% of that time was spent in counseling and/or coordination of care.    Harlene RAYMOND Bowl, DO [facility] Available via Epic secure chat 7am-7pm After these  hours, please refer to coverage provider listed on [email or web address] 04/10/2024, 12:40 PM

## 2024-04-10 NOTE — Progress Notes (Signed)
 Daily Progress Note   Patient Name: Matthew Calderon       Date: 04/10/2024 DOB: Dec 19, 1932  Age: 89 y.o. MRN#: [id number] Attending Physician: Juvenal Harlene PENNER, DO Primary Care Physician: Amin, Saad, MD Admit Date: 04/07/2024  Reason for Consultation/Follow-up: Terminal Care  Subjective: Unresponsive  Length of Stay: 3  Current Medications: Scheduled Meds:   glycopyrrolate   0.3 mg Intravenous Q4H   scopolamine  1 patch Transdermal Q72H    Continuous Infusions:  morphine  3.8 mg/hr (04/10/24 0356)    PRN Meds: acetaminophen  **OR** acetaminophen , albuterol , antiseptic oral rinse, artificial tears, haloperidol  **OR** haloperidol  **OR** haloperidol  lactate, LORazepam **OR** LORazepam **OR** LORazepam, LORazepam, ondansetron  **OR** ondansetron  (ZOFRAN ) IV  Physical Exam Constitutional:      General: He is not in acute distress.    Appearance: He is ill-appearing.     Comments: Appears to be nearing end-of-life Unresponsive No signs of discomfort  Pulmonary:     Effort: Pulmonary effort is normal.  Skin:    General: Skin is warm and dry.             Vital Signs: BP (!) 94/51 (BP Location: Left Arm)   Pulse (!) 116   Temp 97.7 F (36.5 C) (Oral)   Resp 12   Ht 6' (1.829 m)   Wt 68 kg   SpO2 94%   BMI 20.34 kg/m  SpO2: SpO2: 94 % O2 Device: O2 Device: Nasal Cannula O2 Flow Rate: O2 Flow Rate (L/min): 1.5 L/min  Intake/output summary:  Intake/Output Summary (Last 24 hours) at 04/10/2024 1245 Last data filed at 04/10/2024 0356 Gross per 24 hour  Intake 211.02 ml  Output --  Net 211.02 ml   LBM: Last BM Date : 04/06/24 Baseline Weight: Weight: 68 kg Most recent weight: Weight: 68 kg       Palliative Assessment/Data: PPS 10%      Patient Active Problem List   Diagnosis  Date Noted   Intraventricular hemorrhage (HCC) 04/07/2024   Subarachnoid hemorrhage (HCC) 04/07/2024   Unresponsiveness 04/07/2024   Paroxysmal atrial fibrillation (HCC) 04/07/2024   Diabetic ketoacidosis without coma associated with type 2 diabetes mellitus (HCC) 02/25/2024   Acute metabolic encephalopathy 02/23/2024   Myocardial injury 02/23/2024   Recurrent falls 02/23/2024   Atherosclerosis of aorta 02/23/2024   Coronary artery calcification 02/23/2024   Heart failure with improved ejection fraction (HFimpEF) (HCC) 02/23/2024   Multifocal pneumonia 02/23/2024   Atrial fibrillation with RVR (HCC) 02/23/2024   Atrial fibrillation, rapid (HCC) 02/21/2024   CAP (community acquired pneumonia) 02/20/2024   Sepsis (HCC) 02/20/2024   Sepsis due to pneumonia (HCC) 08/12/2023   Age-related osteoporosis without current pathological fracture 03/17/2021   Penicillin allergy 03/17/2021   Androgen deficiency 03/17/2021   Disorder of lipid metabolism 03/17/2021   Diverticular disease of colon 03/17/2021   Drowsiness 03/17/2021   Encounter for general adult medical examination with abnormal findings 03/17/2021   Essential hypertension 03/17/2021   Gout 03/17/2021   History of psychiatric disorder 03/17/2021   Hypocalcemia 03/17/2021   Intertrigo 03/17/2021   Iron deficiency anemia 03/17/2021   Macular degeneration 03/17/2021   Peripheral arterial disease 03/17/2021   Physical deconditioning 03/17/2021   Scoliosis 03/17/2021  Unequal leg length 03/17/2021   Vitamin B12 deficiency 03/17/2021   Vitamin D  deficiency 03/17/2021   Macular pucker of both eyes 11/07/2020   Retinal edema 11/07/2020   Fall 01/14/2020   Chronic diastolic CHF (congestive heart failure) (HCC) 01/12/2020   Multiple rib fractures 01/11/2020   Dysequilibrium 01/11/2020   Fall at home, initial encounter 01/11/2020   Head trauma, subsequent encounter 01/11/2020   Arthritis 05/14/2019   Enlarged prostate 05/14/2019    History of tobacco abuse 05/14/2019   Interstitial cystitis 05/14/2019   Obesity 05/14/2019   OSA (obstructive sleep apnea) 05/14/2019   PONV (postoperative nausea and vomiting) 05/14/2019   Post-nasal drip 05/14/2019   Seasonal allergies 05/14/2019   Chronic cough 07/21/2018   GERD (gastroesophageal reflux disease) 07/21/2018   Non-ST elevation (NSTEMI) myocardial infarction (HCC) 06/20/2018   Prostate hyperplasia, benign localized, without urinary obstruction    Hyperglycemia    Syncope, cardiogenic    Degenerative joint disease (DJD) of hip 01/13/2014   Diabetes (HCC) 01/13/2014   Pseudophakia, both eyes 12/01/2013   Nonexudative age-related macular degeneration 01/16/2013   Primary open angle glaucoma of right eye, severe stage 01/13/2013   Allergic rhinitis 09/17/2010   Anemia, unspecified 09/17/2010   Gastro-esophageal reflux disease with esophagitis 12/27/2009   HLD (hyperlipidemia) 12/27/2009   Hypertrophy of prostate without urinary obstruction and other lower urinary tract symptoms (LUTS) 07/11/2009   Insomnia 07/11/2009   Osteoporosis 07/11/2009   Other malaise and fatigue 06/15/2008   Depressive disorder, not elsewhere classified 05/04/2008   Unspecified glaucoma 05/04/2008    Palliative Care Assessment & Plan   HPI:   89 y.o. male  with past medical history of CAD, CHF, DM2, HTN, HLD, OSA, disorder, recurrent falls  admitted on 04/07/2024 after being noted for lethargic and not arousable by staff.  Worth to note that CT scan of the head revealed  hemorrhage standard within the right caudate lobe decompression into the ventricle system resulting in diffuse intraventricular hemorrhage as well as subarachnoid hemorrhage involving the right cerebellopontine angle cistern.    Per HPI, phone conversation between patient's daughter and hospitalist confirms that they would not recommend any surgical intervention and that CODE STATUS was to remain DNR. They want to keep the  patient comfortable and out of pain.    PMT has been consulted to assist with goals of care conversation. Patient/Family face treatment option decisions, advanced directive decisions and anticipatory care needs.    Family face treatment option decision, advance directive decisions and anticipatory care needs.   Assessment: Patient is unresponsive.  He appears comfortable on current medication regimen. Continue comfort measures only.  Remains on morphine  infusion.  Recommendations/Plan: DNR-comfort Continue comfort measures only PMT will follow  Goals of Care and Additional Recommendations: Limitations on Scope of Treatment: Full Comfort Care  Code Status: DNR  Prognosis:  Hours - Days  Discharge Planning: Anticipated Hospital Death  Thank you for allowing the Palliative Medicine Team to assist in the care of this patient.   Total Time 25 minutes Prolonged Time Billed No  Time spent includes: Detailed review of medical records (labs, imaging, vital signs), medically appropriate exam, discussion with treatment team, counseling and educating patient, family and/or staff, documenting clinical information, medication management and coordination of care.     *Please note that this is a verbal dictation therefore any spelling or grammatical errors are due to the Dragon Medical One system interpretation.  Tobey Jama Barnacle, DNP, AGNP-C Palliative Medicine Team Team Phone # [phone]  Pager [phone]

## 2024-04-10 NOTE — Plan of Care (Signed)
  Problem: Pain Managment: Goal: General experience of comfort will improve and/or be controlled Outcome: Progressing   Problem: Safety: Goal: Ability to remain free from injury will improve Outcome: Progressing

## 2024-04-10 NOTE — Progress Notes (Signed)
 Patient's daughter Ginnie called for an update. Update given. Daughter also request  for MD to call her with an update as well. Notified Dr. Harlene Bowl of the family member's request.

## 2024-04-10 NOTE — Plan of Care (Signed)
  Problem: Pain Management: Goal: Satisfaction with pain management regimen will improve Outcome: Progressing   Problem: Safety: Goal: Ability to remain free from injury will improve Outcome: Progressing

## 2024-04-11 DIAGNOSIS — Z515 Encounter for palliative care: Secondary | ICD-10-CM | POA: Diagnosis not present

## 2024-04-11 DIAGNOSIS — I615 Nontraumatic intracerebral hemorrhage, intraventricular: Secondary | ICD-10-CM | POA: Diagnosis not present

## 2024-04-11 NOTE — Plan of Care (Signed)
  Problem: Pain Management: Goal: Satisfaction with pain management regimen will improve Outcome: Progressing   Problem: Safety: Goal: Ability to remain free from injury will improve Outcome: Progressing

## 2024-04-11 NOTE — Plan of Care (Signed)
  Problem: Pain Managment: Goal: General experience of comfort will improve and/or be controlled Outcome: Progressing   Problem: Safety: Goal: Ability to remain free from injury will improve Outcome: Progressing

## 2024-04-11 NOTE — Progress Notes (Signed)
 Daily Progress Note   Patient Name: Matthew Calderon       Date: 04/11/2024 DOB: Nov 07, 1932  Age: 89 y.o. MRN#: [id number] Attending Physician: Juvenal Harlene PENNER, DO Primary Care Physician: Amin, Saad, MD Admit Date: 04/07/2024  Reason for Consultation/Follow-up: Establishing goals of care  Length of Stay: 4  Current Medications: Scheduled Meds:   glycopyrrolate   0.3 mg Intravenous Q4H   scopolamine  1 patch Transdermal Q72H    Continuous Infusions:  morphine  3.8 mg/hr (04/11/24 0639)    PRN Meds: acetaminophen  **OR** acetaminophen , albuterol , antiseptic oral rinse, artificial tears, haloperidol  **OR** haloperidol  **OR** haloperidol  lactate, LORazepam **OR** LORazepam **OR** LORazepam, ondansetron  **OR** ondansetron  (ZOFRAN ) IV  Physical Exam Vitals reviewed.  Constitutional:      General: He is not in acute distress.    Appearance: He is ill-appearing.  HENT:     Mouth/Throat:     Mouth: Mucous membranes are dry.  Pulmonary:     Effort: Pulmonary effort is normal.  Skin:    General: Skin is warm and dry.             Vital Signs: BP (!) 123/55 (BP Location: Left Arm)   Pulse (!) 123   Temp 97.7 F (36.5 C) (Oral)   Resp 12   Ht 6' (1.829 m)   Wt 68 kg   SpO2 95%   BMI 20.34 kg/m  SpO2: SpO2: 95 % O2 Device: O2 Device: Nasal Cannula O2 Flow Rate: O2 Flow Rate (L/min): 2 L/min       Palliative Assessment/Data: 10%      Patient Active Problem List   Diagnosis Date Noted   Intraventricular hemorrhage (HCC) 04/07/2024   Subarachnoid hemorrhage (HCC) 04/07/2024   Unresponsiveness 04/07/2024   Paroxysmal atrial fibrillation (HCC) 04/07/2024   Diabetic ketoacidosis without coma associated with type 2 diabetes mellitus (HCC) 02/25/2024   Acute metabolic  encephalopathy 02/23/2024   Myocardial injury 02/23/2024   Recurrent falls 02/23/2024   Atherosclerosis of aorta 02/23/2024   Coronary artery calcification 02/23/2024   Heart failure with improved ejection fraction (HFimpEF) (HCC) 02/23/2024   Multifocal pneumonia 02/23/2024   Atrial fibrillation with RVR (HCC) 02/23/2024   Atrial fibrillation, rapid (HCC) 02/21/2024   CAP (community acquired pneumonia) 02/20/2024   Sepsis (HCC) 02/20/2024   Sepsis due to pneumonia (HCC) 08/12/2023  Age-related osteoporosis without current pathological fracture 03/17/2021   Penicillin allergy 03/17/2021   Androgen deficiency 03/17/2021   Disorder of lipid metabolism 03/17/2021   Diverticular disease of colon 03/17/2021   Drowsiness 03/17/2021   Encounter for general adult medical examination with abnormal findings 03/17/2021   Essential hypertension 03/17/2021   Gout 03/17/2021   History of psychiatric disorder 03/17/2021   Hypocalcemia 03/17/2021   Intertrigo 03/17/2021   Iron deficiency anemia 03/17/2021   Macular degeneration 03/17/2021   Peripheral arterial disease 03/17/2021   Physical deconditioning 03/17/2021   Scoliosis 03/17/2021   Unequal leg length 03/17/2021   Vitamin B12 deficiency 03/17/2021   Vitamin D  deficiency 03/17/2021   Macular pucker of both eyes 11/07/2020   Retinal edema 11/07/2020   Fall 01/14/2020   Chronic diastolic CHF (congestive heart failure) (HCC) 01/12/2020   Multiple rib fractures 01/11/2020   Dysequilibrium 01/11/2020   Fall at home, initial encounter 01/11/2020   Head trauma, subsequent encounter 01/11/2020   Arthritis 05/14/2019   Enlarged prostate 05/14/2019   History of tobacco abuse 05/14/2019   Interstitial cystitis 05/14/2019   Obesity 05/14/2019   OSA (obstructive sleep apnea) 05/14/2019   PONV (postoperative nausea and vomiting) 05/14/2019   Post-nasal drip 05/14/2019   Seasonal allergies 05/14/2019   Chronic cough 07/21/2018   GERD  (gastroesophageal reflux disease) 07/21/2018   Non-ST elevation (NSTEMI) myocardial infarction (HCC) 06/20/2018   Prostate hyperplasia, benign localized, without urinary obstruction    Hyperglycemia    Syncope, cardiogenic    Degenerative joint disease (DJD) of hip 01/13/2014   Diabetes (HCC) 01/13/2014   Pseudophakia, both eyes 12/01/2013   Nonexudative age-related macular degeneration 01/16/2013   Primary open angle glaucoma of right eye, severe stage 01/13/2013   Allergic rhinitis 09/17/2010   Anemia, unspecified 09/17/2010   Gastro-esophageal reflux disease with esophagitis 12/27/2009   HLD (hyperlipidemia) 12/27/2009   Hypertrophy of prostate without urinary obstruction and other lower urinary tract symptoms (LUTS) 07/11/2009   Insomnia 07/11/2009   Osteoporosis 07/11/2009   Other malaise and fatigue 06/15/2008   Depressive disorder, not elsewhere classified 05/04/2008   Unspecified glaucoma 05/04/2008    Palliative Care Assessment & Plan   Patient Profile: 89 y.o. male  with past medical history of CAD, CHF, DM2, HTN, HLD, OSA, disorder, recurrent falls  admitted on 04/07/2024 after being noted for lethargic and not arousable by staff.  Worth to note that CT scan of the head revealed  hemorrhage standard within the right caudate lobe decompression into the ventricle system resulting in diffuse intraventricular hemorrhage as well as subarachnoid hemorrhage involving the right cerebellopontine angle cistern.    Per HPI, phone conversation between patient's daughter and hospitalist confirms that they would not recommend any surgical intervention and that CODE STATUS was to remain DNR. They want to keep the patient comfortable and out of pain.    PMT has been consulted to assist with goals of care conversation. Patient/Family face treatment option decisions, advanced directive decisions and anticipatory care needs.    Family face treatment option decision, advance directive decisions  and anticipatory care needs.   Assessment: Reviewed chart. Patient remains on scheduled robinul  and continuous morphine  infusion. He is unresponsive but appears comfortable. No family at bedside. Per previous note anticipated hospital death. PMT will continue to support.  Recommendations/Plan: DNR-comfort Continue comfort measures only PMT will follow   Code Status:    Code Status Orders  (From admission, onward)           Start  Ordered   04/07/24 1639  Do not attempt resuscitation (DNR) - Comfort care  Continuous       Question Answer Comment  If patient has no pulse and is not breathing Do Not Attempt Resuscitation   In Pre-Arrest Conditions (Patient Is Breathing and Has a Pulse) Provide comfort measures. Relieve any mechanical airway obstruction. Avoid transfer unless required for comfort.   Consent: Discussion documented in EHR or advanced directives reviewed      04/07/24 1638         Extensive chart review has been completed prior to seeing the patient including vital signs, progress/consult notes, orders, medications, and available advance directive documents.  Time spent: 25 minutes  Thank you for allowing the Palliative Medicine Team to assist in the care of this patient.    Stephane CHRISTELLA Palin, NP  Please contact Palliative Medicine Team phone at [phone] for questions and concerns.

## 2024-04-11 NOTE — Progress Notes (Signed)
 PROGRESS NOTE    [name]  FMW:[id number] DOB: [birth year] DOA: 04/07/2024 PCP: Caleen Dirks, MD    Brief Narrative:  Matthew Calderon is a 89 y.o. male with medical history significant of CAD, CHF, DM2, HTN, HLD, OSA, disorder, recurrent falls comes to the ED from [facility] after being noted to be lethargic and not arousable by staff.  Patient is unable to give any history as he is nonresponsive and is obtained from review of records.   Last known well was around 10 PM last night.  Patient patient is on Plavix .  Patient was only noted to be moving the left leg and nonverbal.  No report of any fall.   Patient had just recently been hospitalized after presenting from a ground-level fall found to have a type II dens fracture the day before from 8/28-9/8 found to be septic secondary to pneumonia.  During hospitalization also was treated for euglycemic diabetic ketoacidosis thought secondary to [place].  Ultimately he was able to be discharged back to [facility].   Assessment and Plan: ntraventricular hemorrhage Subarachnoid hemorrhage Unresponsiveness Heart failure with improved ejection fraction Paroxysmal atrial fibrillation CAD Hypertension Hyperlipidemia Diabetes mellitus type 2 OSA Patient presented after being noted to be acutely altered and unresponsive.  On physical exam noted to be moving left lower extremity spontaneously and all other extremities only to noxious stimuli.  CT scan of the head noted large intraventricular hemorrhage.  Family confirmed DNR status.  Neurosurgery have been consulted, but no surgical intervention was agreed upon for which comfort measures were recommended to keep the patient comfortable. # Would anticipate in-hospital death   DVT prophylaxis:     Code Status: Do not attempt resuscitation (DNR) - Comfort care Family Communication: Called daughter  Disposition Plan:  Level of care: Palliative Care Status is: Inpatient     Consultants:   Neurosurgery Palliative care   Subjective: Appears comfortable  Objective: Vitals:   04/09/24 0844 04/09/24 1759 04/10/24 0449 04/11/24 0615  BP: (!) 84/48 (!) 93/47 (!) 94/51 (!) 123/55  Pulse: (!) 126 (!) 110 (!) 116 (!) 123  Resp: 10 16 12    Temp: (!) 101.6 F (38.7 C) 98.8 F (37.1 C) 97.7 F (36.5 C)   TempSrc:  Oral Oral   SpO2: (!) 88% 91% 94% 95%  Weight:      Height:        Intake/Output Summary (Last 24 hours) at 04/11/2024 1152 Last data filed at 04/11/2024 9360 Gross per 24 hour  Intake 101.03 ml  Output --  Net 101.03 ml    Filed Weights   04/07/24 1125  Weight: 68 kg    Examination:   In bed, appears comfortable, some upper airway sounds-appears very similar to yesterday   Data Reviewed: I have personally reviewed following labs and imaging studies  CBC: Recent Labs  Lab 04/07/24 1145  WBC 9.8  NEUTROABS 8.2*  HGB 10.0*  HCT 31.3*  MCV 87.7  PLT 435*   Basic Metabolic Panel: Recent Labs  Lab 04/07/24 1145  NA 134*  K 4.8  CL 99  CO2 18*  GLUCOSE 148*  BUN 17  CREATININE 1.04  CALCIUM  8.4*   GFR: Estimated Creatinine Clearance: 46.3 mL/min (by C-G formula based on SCr of 1.04 mg/dL). Liver Function Tests: Recent Labs  Lab 04/07/24 1145  AST 35  ALT 65*  ALKPHOS 85  BILITOT 1.0  PROT 6.8  ALBUMIN 2.2*   No results for input(s): LIPASE, AMYLASE in the last 168  hours. No results for input(s): AMMONIA in the last 168 hours. Coagulation Profile: Recent Labs  Lab 04/07/24 1145  INR 1.4*   Cardiac Enzymes: No results for input(s): CKTOTAL, CKMB, CKMBINDEX, TROPONINI in the last 168 hours. BNP (last 3 results) No results for input(s): PROBNP in the last 8760 hours. HbA1C: No results for input(s): HGBA1C in the last 72 hours. CBG: Recent Labs  Lab 04/07/24 1137  GLUCAP 138*   Lipid Profile: No results for input(s): CHOL, HDL, LDLCALC, TRIG, CHOLHDL, LDLDIRECT in the last 72  hours. Thyroid  Function Tests: No results for input(s): TSH, T4TOTAL, FREET4, T3FREE, THYROIDAB in the last 72 hours. Anemia Panel: No results for input(s): VITAMINB12, FOLATE, FERRITIN, TIBC, IRON, RETICCTPCT in the last 72 hours. Sepsis Labs: Recent Labs  Lab 04/07/24 1211  LATICACIDVEN 1.5    Recent Results (from the past 240 hours)  Resp panel by RT-PCR (RSV, Flu A&B, Covid) Anterior Nasal Swab     Status: None   Collection Time: 04/07/24 11:37 AM   Specimen: Anterior Nasal Swab  Result Value Ref Range Status   SARS Coronavirus 2 by RT PCR NEGATIVE NEGATIVE Final   Influenza A by PCR NEGATIVE NEGATIVE Final   Influenza B by PCR NEGATIVE NEGATIVE Final    Comment: (NOTE) The Xpert Xpress SARS-CoV-2/FLU/RSV plus assay is intended as an aid in the diagnosis of influenza from Nasopharyngeal swab specimens and should not be used as a sole basis for treatment. Nasal washings and aspirates are unacceptable for Xpert Xpress SARS-CoV-2/FLU/RSV testing.  Fact Sheet for Patients: BloggerCourse.com  Fact Sheet for Healthcare Providers: SeriousBroker.it  This test is not yet approved or cleared by the [place]  FDA and has been authorized for detection and/or diagnosis of SARS-CoV-2 by FDA under an Emergency Use Authorization (EUA). This EUA will remain in effect (meaning this test can be used) for the duration of the COVID-19 declaration under Section 564(b)(1) of the Act, 21 U.S.C. section 360bbb-3(b)(1), unless the authorization is terminated or revoked.     Resp Syncytial Virus by PCR NEGATIVE NEGATIVE Final    Comment: (NOTE) Fact Sheet for Patients: BloggerCourse.com  Fact Sheet for Healthcare Providers: SeriousBroker.it  This test is not yet approved or cleared by the [place]  FDA and has been authorized for detection and/or diagnosis of  SARS-CoV-2 by FDA under an Emergency Use Authorization (EUA). This EUA will remain in effect (meaning this test can be used) for the duration of the COVID-19 declaration under Section 564(b)(1) of the Act, 21 U.S.C. section 360bbb-3(b)(1), unless the authorization is terminated or revoked.  Performed at [facility], [address], [place], [place] [address]   Blood Culture (routine x 2)     Status: None (Preliminary result)   Collection Time: 04/07/24 11:50 AM   Specimen: BLOOD  Result Value Ref Range Status   Specimen Description BLOOD RIGHT ANTECUBITAL  Final   Special Requests   Final    BOTTLES DRAWN AEROBIC AND ANAEROBIC Blood Culture adequate volume   Culture   Final    NO GROWTH 4 DAYS Performed at [facility], [address] [place], [place] [address]    Report Status PENDING  Incomplete  Blood Culture (routine x 2)     Status: None (Preliminary result)   Collection Time: 04/07/24  1:17 PM   Specimen: BLOOD RIGHT HAND  Result Value Ref Range Status   Specimen Description BLOOD RIGHT HAND  Final   Special Requests   Final  BOTTLES DRAWN AEROBIC AND ANAEROBIC Blood Culture results may not be optimal due to an inadequate volume of blood received in culture bottles   Culture   Final    NO GROWTH 4 DAYS Performed at [facility], [address], [place], [place] [address]    Report Status PENDING  Incomplete         Radiology Studies: CT HEAD WO CONTRAST ( ) Addendum Date: 04/07/2024 ADDENDUM #1ADDENDUM: Findings discussed with Dr. Patsey at 12:59PM on 04/07/24. ---------------------------------------------------- Electronically signed by: Donnice Mania MD 04/07/2024 05:30 PM EDT RP Workstation: [id number]   Result Date: 04/07/2024  ORIGINAL REPORT EXAM: CT HEAD WITHOUT CONTRAST 04/07/2024 12:36:44 PM TECHNIQUE: CT of the head was performed without the administration of intravenous contrast. Automated exposure control, iterative  reconstruction, and/or weight based adjustment of the mA/kV was utilized to reduce the radiation dose to as low as reasonably achievable. COMPARISON: CT head 02/19/24. CLINICAL HISTORY: Mental status change, unknown cause. FINDINGS: BRAIN AND VENTRICLES: There is a 2.4 x 0.7 x 1.1 cm focus of hemorrhage within the posterior aspect of the right basal ganglia involving the caudate nucleus. There is decompression of hemorrhage into the ventricular system with prominent areas of hyperattenuating clot/blood products within the lateral ventricles. Additional layering hemorrhage within the bilateral occipital horns. Hemorrhage also noted extending into and distending the third ventricle. There is hemorrhage within the cerebral aqueduct and fourth ventricle which also appears slightly distended. Hemorrhage noted extending through the foramen of Luschka. There is likely subarachnoid hemorrhage involving the right cerebellopontine angle cistern. There is hyperattenuating soft tissue at the foramen magnum which may reflect additional extra-axial blood products. There is increased caliber of the lateral ventricles compared to the prior study. Generalized parenchymal volume loss. Atherosclerosis of the carotid siphons. Bilateral lens replacement. No evidence of acute infarct. No mass effect or midline shift. ORBITS: Bilateral lens replacement. No acute abnormality. SINUSES: No acute abnormality. SOFT TISSUES AND SKULL: No acute soft tissue abnormality. No skull fracture. 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 IMPRESSION: 1. Hemorrhage centered within the right caudate with decompression into the ventricular system, resulting in prominent diffuse intraventricular hemorrhage and increased ventricular caliber compared to prior study. 2. Subarachnoid hemorrhage involving the right cerebellopontine angle cistern. 3. Also likely additional extra-axial blood products at the foramen magnum. Electronically signed by:  Donnice Mania MD 04/07/2024 12:53 PM EDT RP Workstation: [id number]   DG Chest Port 1 View Result Date: 04/07/2024 CLINICAL DATA:  Sepsis. EXAM: PORTABLE CHEST 1 VIEW COMPARISON:  02/19/2024 FINDINGS: Low volume film with stable asymmetric elevation of the right hemidiaphragm. Subtle patchy airspace disease is seen in the lung bases, likely atelectatic although pneumonia not excluded. The cardio pericardial silhouette is enlarged. Bones are diffusely demineralized. Telemetry leads overlie the chest. IMPRESSION: Low volume film with subtle patchy airspace disease in the lung bases, likely atelectatic although pneumonia not excluded. Electronically Signed   By: Camellia Candle M.D.   On: 04/07/2024 12:37        Scheduled Meds:  glycopyrrolate   0.3 mg Intravenous Q4H   scopolamine  1 patch Transdermal Q72H   Continuous Infusions:  morphine  3.8 mg/hr (04/11/24 0639)     LOS: 4 days    Time spent: 45 minutes spent on chart review, discussion with nursing staff, consultants, updating family and interview/physical exam; more than 50% of that time was spent in counseling and/or coordination of care.    Harlene RAYMOND Bowl, DO [facility] Available via Epic secure  chat 7am-7pm After these hours, please refer to coverage provider listed on [email or web address] 04/11/2024, 11:52 AM

## 2024-04-12 DIAGNOSIS — Z515 Encounter for palliative care: Secondary | ICD-10-CM | POA: Diagnosis not present

## 2024-04-12 DIAGNOSIS — I615 Nontraumatic intracerebral hemorrhage, intraventricular: Secondary | ICD-10-CM | POA: Diagnosis not present

## 2024-04-12 DIAGNOSIS — Z7189 Other specified counseling: Secondary | ICD-10-CM | POA: Diagnosis not present

## 2024-04-12 LAB — CULTURE, BLOOD (ROUTINE X 2)
Culture: NO GROWTH
Culture: NO GROWTH
Special Requests: ADEQUATE

## 2024-04-12 MED ORDER — MORPHINE BOLUS VIA INFUSION
2.0000 mg | INTRAVENOUS | Status: DC | PRN
  Administered 2024-04-12 (×3): 2 mg via INTRAVENOUS
  Administered 2024-04-12: 4 mg via INTRAVENOUS
  Administered 2024-04-12: 2 mg via INTRAVENOUS
  Administered 2024-04-12: 4 mg via INTRAVENOUS

## 2024-04-12 NOTE — Progress Notes (Addendum)
 Daily Progress Note   Patient Name: Matthew Calderon       Date: 04/12/2024 DOB: April 04, 1933  Age: 89 y.o. MRN#: [id number] Attending Physician: Juvenal Harlene PENNER, DO Primary Care Physician: Amin, Saad, MD Admit Date: 04/07/2024  Reason for Consultation/Follow-up: Establishing goals of care  Length of Stay: 5  Current Medications: Scheduled Meds:   glycopyrrolate   0.3 mg Intravenous Q4H   scopolamine  1 patch Transdermal Q72H    Continuous Infusions:  morphine  3.8 mg/hr (04/12/24 0042)    PRN Meds: acetaminophen  **OR** acetaminophen , albuterol , antiseptic oral rinse, artificial tears, haloperidol  **OR** haloperidol  **OR** haloperidol  lactate, LORazepam **OR** LORazepam **OR** LORazepam, ondansetron  **OR** ondansetron  (ZOFRAN ) IV  Physical Exam Vitals reviewed.  Constitutional:      General: He is not in acute distress.    Appearance: He is ill-appearing.  HENT:     Head:     Comments: secretions    Mouth/Throat:     Mouth: Mucous membranes are dry.  Pulmonary:     Effort: Pulmonary effort is normal.  Skin:    General: Skin is warm and dry.             Vital Signs: BP (!) 143/66 (BP Location: Left Arm)   Pulse (!) 129   Temp (!) 102.7 F (39.3 C) (Oral)   Resp 18   Ht 6' (1.829 m)   Wt 68 kg   SpO2 95%   BMI 20.34 kg/m  SpO2: SpO2: 95 % O2 Device: O2 Device: Nasal Cannula O2 Flow Rate: O2 Flow Rate (L/min): 1.5 L/min       Palliative Assessment/Data: 10%      Patient Active Problem List   Diagnosis Date Noted   Intraventricular hemorrhage (HCC) 04/07/2024   Subarachnoid hemorrhage (HCC) 04/07/2024   Unresponsiveness 04/07/2024   Paroxysmal atrial fibrillation (HCC) 04/07/2024   Diabetic ketoacidosis without coma associated with type 2 diabetes mellitus  (HCC) 02/25/2024   Acute metabolic encephalopathy 02/23/2024   Myocardial injury 02/23/2024   Recurrent falls 02/23/2024   Atherosclerosis of aorta 02/23/2024   Coronary artery calcification 02/23/2024   Heart failure with improved ejection fraction (HFimpEF) (HCC) 02/23/2024   Multifocal pneumonia 02/23/2024   Atrial fibrillation with RVR (HCC) 02/23/2024   Atrial fibrillation, rapid (HCC) 02/21/2024   CAP (community acquired pneumonia) 02/20/2024   Sepsis (  HCC) 02/20/2024   Sepsis due to pneumonia (HCC) 08/12/2023   Age-related osteoporosis without current pathological fracture 03/17/2021   Penicillin allergy 03/17/2021   Androgen deficiency 03/17/2021   Disorder of lipid metabolism 03/17/2021   Diverticular disease of colon 03/17/2021   Drowsiness 03/17/2021   Encounter for general adult medical examination with abnormal findings 03/17/2021   Essential hypertension 03/17/2021   Gout 03/17/2021   History of psychiatric disorder 03/17/2021   Hypocalcemia 03/17/2021   Intertrigo 03/17/2021   Iron deficiency anemia 03/17/2021   Macular degeneration 03/17/2021   Peripheral arterial disease 03/17/2021   Physical deconditioning 03/17/2021   Scoliosis 03/17/2021   Unequal leg length 03/17/2021   Vitamin B12 deficiency 03/17/2021   Vitamin D  deficiency 03/17/2021   Macular pucker of both eyes 11/07/2020   Retinal edema 11/07/2020   Fall 01/14/2020   Chronic diastolic CHF (congestive heart failure) (HCC) 01/12/2020   Multiple rib fractures 01/11/2020   Dysequilibrium 01/11/2020   Fall at home, initial encounter 01/11/2020   Head trauma, subsequent encounter 01/11/2020   Arthritis 05/14/2019   Enlarged prostate 05/14/2019   History of tobacco abuse 05/14/2019   Interstitial cystitis 05/14/2019   Obesity 05/14/2019   OSA (obstructive sleep apnea) 05/14/2019   PONV (postoperative nausea and vomiting) 05/14/2019   Post-nasal drip 05/14/2019   Seasonal allergies 05/14/2019    Chronic cough 07/21/2018   GERD (gastroesophageal reflux disease) 07/21/2018   Non-ST elevation (NSTEMI) myocardial infarction (HCC) 06/20/2018   Prostate hyperplasia, benign localized, without urinary obstruction    Hyperglycemia    Syncope, cardiogenic    Degenerative joint disease (DJD) of hip 01/13/2014   Diabetes (HCC) 01/13/2014   Pseudophakia, both eyes 12/01/2013   Nonexudative age-related macular degeneration 01/16/2013   Primary open angle glaucoma of right eye, severe stage 01/13/2013   Allergic rhinitis 09/17/2010   Anemia, unspecified 09/17/2010   Gastro-esophageal reflux disease with esophagitis 12/27/2009   HLD (hyperlipidemia) 12/27/2009   Hypertrophy of prostate without urinary obstruction and other lower urinary tract symptoms (LUTS) 07/11/2009   Insomnia 07/11/2009   Osteoporosis 07/11/2009   Other malaise and fatigue 06/15/2008   Depressive disorder, not elsewhere classified 05/04/2008   Unspecified glaucoma 05/04/2008    Palliative Care Assessment & Plan   Patient Profile: 89 y.o. male  with past medical history of CAD, CHF, DM2, HTN, HLD, OSA, disorder, recurrent falls  admitted on 04/07/2024 after being noted for lethargic and not arousable by staff.  Worth to note that CT scan of the head revealed  hemorrhage standard within the right caudate lobe decompression into the ventricle system resulting in diffuse intraventricular hemorrhage as well as subarachnoid hemorrhage involving the right cerebellopontine angle cistern.    Per HPI, phone conversation between patient's daughter and hospitalist confirms that they would not recommend any surgical intervention and that CODE STATUS was to remain DNR. They want to keep the patient comfortable and out of pain.    PMT has been consulted to assist with goals of care conversation. Patient/Family face treatment option decisions, advanced directive decisions and anticipatory care needs.    Family face treatment option  decision, advance directive decisions and anticipatory care needs.   Assessment: Reviewed chart and received update from attending. Patient remains on scopolamine patch, scheduled robinul , and continuous morphine  infusion. He is unresponsive with audible secretions and appears comfortable. No family at bedside. Per previous note anticipated hospital death. PMT will continue to support.  Recommendations/Plan: DNR-comfort Continue comfort measures only Added prn morphine  bolus available every  30 min for pain or shortness of breath PMT will follow   Code Status:    Code Status Orders  (From admission, onward)           Start     Ordered   04/07/24 1639  Do not attempt resuscitation (DNR) - Comfort care  Continuous       Question Answer Comment  If patient has no pulse and is not breathing Do Not Attempt Resuscitation   In Pre-Arrest Conditions (Patient Is Breathing and Has a Pulse) Provide comfort measures. Relieve any mechanical airway obstruction. Avoid transfer unless required for comfort.   Consent: Discussion documented in EHR or advanced directives reviewed      04/07/24 1638         Extensive chart review has been completed prior to seeing the patient including vital signs, progress/consult notes, orders, medications, and available advance directive documents.  Time spent: 35 minutes  Thank you for allowing the Palliative Medicine Team to assist in the care of this patient.    Stephane CHRISTELLA Palin, NP  Please contact Palliative Medicine Team phone at [phone] for questions and concerns.

## 2024-04-12 NOTE — Progress Notes (Signed)
 PROGRESS NOTE    Matthew Calderon  FMW:[id number] DOB: 04-03-1933 DOA: 04/07/2024 PCP: Caleen Dirks, MD    Brief Narrative:  Matthew Calderon is a 89 y.o. male with medical history significant of CAD, CHF, DM2, HTN, HLD, OSA, disorder, recurrent falls comes to the ED from [facility] after being noted to be lethargic and not arousable by staff.  Patient is unable to give any history as he is nonresponsive and is obtained from review of records.   Last known well was around 10 PM last night.  Patient patient is on Plavix .  Patient was only noted to be moving the left leg and nonverbal.  No report of any fall.   Patient had just recently been hospitalized after presenting from a ground-level fall found to have a type II dens fracture the day before from 8/28-9/8 found to be septic secondary to pneumonia.  During hospitalization also was treated for euglycemic diabetic ketoacidosis thought secondary to [place].  Ultimately he was able to be discharged back to [facility].   Assessment and Plan: ntraventricular hemorrhage Subarachnoid hemorrhage Unresponsiveness Heart failure with improved ejection fraction Paroxysmal atrial fibrillation CAD Hypertension Hyperlipidemia Diabetes mellitus type 2 OSA Patient presented after being noted to be acutely altered and unresponsive.  On physical exam noted to be moving left lower extremity spontaneously and all other extremities only to noxious stimuli.  CT scan of the head noted large intraventricular hemorrhage.  Family confirmed DNR status.  Neurosurgery have been consulted, but no surgical intervention was agreed upon for which comfort measures were recommended to keep the patient comfortable. # Would anticipate in-hospital death   DVT prophylaxis:     Code Status: Do not attempt resuscitation (DNR) - Comfort care Family Communication: Called daughter  Disposition Plan:  Level of care: Palliative Care Status is: Inpatient     Consultants:   Neurosurgery Palliative care   Subjective: Appears comfortable  Objective: Vitals:   04/09/24 1759 04/10/24 0449 04/11/24 0615 04/12/24 0326  BP: (!) 93/47 (!) 94/51 (!) 123/55 (!) 143/66  Pulse: (!) 110 (!) 116 (!) 123 (!) 129  Resp: 16 12  18   Temp: 98.8 F (37.1 C) 97.7 F (36.5 C)  (!) 102.7 F (39.3 C)  TempSrc: Oral Oral  Oral  SpO2: 91% 94% 95% 95%  Weight:      Height:        Intake/Output Summary (Last 24 hours) at 04/12/2024 1213 Last data filed at 04/12/2024 1030 Gross per 24 hour  Intake 0 ml  Output --  Net 0 ml    Filed Weights   04/07/24 1125  Weight: 68 kg    Examination:  Breathing more rapid today   Data Reviewed: I have personally reviewed following labs and imaging studies  CBC: Recent Labs  Lab 04/07/24 1145  WBC 9.8  NEUTROABS 8.2*  HGB 10.0*  HCT 31.3*  MCV 87.7  PLT 435*   Basic Metabolic Panel: Recent Labs  Lab 04/07/24 1145  NA 134*  K 4.8  CL 99  CO2 18*  GLUCOSE 148*  BUN 17  CREATININE 1.04  CALCIUM  8.4*   GFR: Estimated Creatinine Clearance: 46.3 mL/min (by C-G formula based on SCr of 1.04 mg/dL). Liver Function Tests: Recent Labs  Lab 04/07/24 1145  AST 35  ALT 65*  ALKPHOS 85  BILITOT 1.0  PROT 6.8  ALBUMIN 2.2*   No results for input(s): LIPASE, AMYLASE in the last 168 hours. No results for input(s): AMMONIA in the last 168  hours. Coagulation Profile: Recent Labs  Lab 04/07/24 1145  INR 1.4*   Cardiac Enzymes: No results for input(s): CKTOTAL, CKMB, CKMBINDEX, TROPONINI in the last 168 hours. BNP (last 3 results) No results for input(s): PROBNP in the last 8760 hours. HbA1C: No results for input(s): HGBA1C in the last 72 hours. CBG: Recent Labs  Lab 04/07/24 1137  GLUCAP 138*   Lipid Profile: No results for input(s): CHOL, HDL, LDLCALC, TRIG, CHOLHDL, LDLDIRECT in the last 72 hours. Thyroid  Function Tests: No results for input(s): TSH, T4TOTAL,  FREET4, T3FREE, THYROIDAB in the last 72 hours. Anemia Panel: No results for input(s): VITAMINB12, FOLATE, FERRITIN, TIBC, IRON, RETICCTPCT in the last 72 hours. Sepsis Labs: Recent Labs  Lab 04/07/24 1211  LATICACIDVEN 1.5    Recent Results (from the past 240 hours)  Resp panel by RT-PCR (RSV, Flu A&B, Covid) Anterior Nasal Swab     Status: None   Collection Time: 04/07/24 11:37 AM   Specimen: Anterior Nasal Swab  Result Value Ref Range Status   SARS Coronavirus 2 by RT PCR NEGATIVE NEGATIVE Final   Influenza A by PCR NEGATIVE NEGATIVE Final   Influenza B by PCR NEGATIVE NEGATIVE Final    Comment: (NOTE) The Xpert Xpress SARS-CoV-2/FLU/RSV plus assay is intended as an aid in the diagnosis of influenza from Nasopharyngeal swab specimens and should not be used as a sole basis for treatment. Nasal washings and aspirates are unacceptable for Xpert Xpress SARS-CoV-2/FLU/RSV testing.  Fact Sheet for Patients: BloggerCourse.com  Fact Sheet for Healthcare Providers: SeriousBroker.it  This test is not yet approved or cleared by the [place]  FDA and has been authorized for detection and/or diagnosis of SARS-CoV-2 by FDA under an Emergency Use Authorization (EUA). This EUA will remain in effect (meaning this test can be used) for the duration of the COVID-19 declaration under Section 564(b)(1) of the Act, 21 U.S.C. section 360bbb-3(b)(1), unless the authorization is terminated or revoked.     Resp Syncytial Virus by PCR NEGATIVE NEGATIVE Final    Comment: (NOTE) Fact Sheet for Patients: BloggerCourse.com  Fact Sheet for Healthcare Providers: SeriousBroker.it  This test is not yet approved or cleared by the [place]  FDA and has been authorized for detection and/or diagnosis of SARS-CoV-2 by FDA under an Emergency Use Authorization (EUA). This EUA will  remain in effect (meaning this test can be used) for the duration of the COVID-19 declaration under Section 564(b)(1) of the Act, 21 U.S.C. section 360bbb-3(b)(1), unless the authorization is terminated or revoked.  Performed at [facility], [address], [place], [place] [address]   Blood Culture (routine x 2)     Status: None   Collection Time: 04/07/24 11:50 AM   Specimen: BLOOD  Result Value Ref Range Status   Specimen Description BLOOD RIGHT ANTECUBITAL  Final   Special Requests   Final    BOTTLES DRAWN AEROBIC AND ANAEROBIC Blood Culture adequate volume   Culture   Final    NO GROWTH 5 DAYS Performed at [facility], [address], [place], [place] [address]    Report Status 04/12/2024 FINAL  Final  Blood Culture (routine x 2)     Status: None   Collection Time: 04/07/24  1:17 PM   Specimen: BLOOD RIGHT HAND  Result Value Ref Range Status   Specimen Description BLOOD RIGHT HAND  Final   Special Requests   Final    BOTTLES DRAWN AEROBIC AND ANAEROBIC Blood Culture results may not be  optimal due to an inadequate volume of blood received in culture bottles   Culture   Final    NO GROWTH 5 DAYS Performed at [facility], [address], [place], [place] [address]    Report Status 04/12/2024 FINAL  Final         Radiology Studies: CT HEAD WO CONTRAST ( ) Addendum Date: 04/07/2024 ADDENDUM #1ADDENDUM: Findings discussed with Dr. Patsey at 12:59PM on 04/07/24. ---------------------------------------------------- Electronically signed by: Donnice Mania MD 04/07/2024 05:30 PM EDT RP Workstation: [id number]   Result Date: 04/07/2024  ORIGINAL REPORT EXAM: CT HEAD WITHOUT CONTRAST 04/07/2024 12:36:44 PM TECHNIQUE: CT of the head was performed without the administration of intravenous contrast. Automated exposure control, iterative reconstruction, and/or weight based adjustment of the mA/kV was utilized to reduce the radiation dose to as low as  reasonably achievable. COMPARISON: CT head 02/19/24. CLINICAL HISTORY: Mental status change, unknown cause. FINDINGS: BRAIN AND VENTRICLES: There is a 2.4 x 0.7 x 1.1 cm focus of hemorrhage within the posterior aspect of the right basal ganglia involving the caudate nucleus. There is decompression of hemorrhage into the ventricular system with prominent areas of hyperattenuating clot/blood products within the lateral ventricles. Additional layering hemorrhage within the bilateral occipital horns. Hemorrhage also noted extending into and distending the third ventricle. There is hemorrhage within the cerebral aqueduct and fourth ventricle which also appears slightly distended. Hemorrhage noted extending through the foramen of Luschka. There is likely subarachnoid hemorrhage involving the right cerebellopontine angle cistern. There is hyperattenuating soft tissue at the foramen magnum which may reflect additional extra-axial blood products. There is increased caliber of the lateral ventricles compared to the prior study. Generalized parenchymal volume loss. Atherosclerosis of the carotid siphons. Bilateral lens replacement. No evidence of acute infarct. No mass effect or midline shift. ORBITS: Bilateral lens replacement. No acute abnormality. SINUSES: No acute abnormality. SOFT TISSUES AND SKULL: No acute soft tissue abnormality. No skull fracture. 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 IMPRESSION: 1. Hemorrhage centered within the right caudate with decompression into the ventricular system, resulting in prominent diffuse intraventricular hemorrhage and increased ventricular caliber compared to prior study. 2. Subarachnoid hemorrhage involving the right cerebellopontine angle cistern. 3. Also likely additional extra-axial blood products at the foramen magnum. Electronically signed by: Donnice Mania MD 04/07/2024 12:53 PM EDT RP Workstation: [id number]   DG Chest Port 1 View Result Date:  04/07/2024 CLINICAL DATA:  Sepsis. EXAM: PORTABLE CHEST 1 VIEW COMPARISON:  02/19/2024 FINDINGS: Low volume film with stable asymmetric elevation of the right hemidiaphragm. Subtle patchy airspace disease is seen in the lung bases, likely atelectatic although pneumonia not excluded. The cardio pericardial silhouette is enlarged. Bones are diffusely demineralized. Telemetry leads overlie the chest. IMPRESSION: Low volume film with subtle patchy airspace disease in the lung bases, likely atelectatic although pneumonia not excluded. Electronically Signed   By: Camellia Candle M.D.   On: 04/07/2024 12:37        Scheduled Meds:  glycopyrrolate   0.3 mg Intravenous Q4H   scopolamine  1 patch Transdermal Q72H   Continuous Infusions:  morphine  3.8 mg/hr (04/12/24 0042)     LOS: 5 days    Time spent: 45 minutes spent on chart review, discussion with nursing staff, consultants, updating family and interview/physical exam; more than 50% of that time was spent in counseling and/or coordination of care.    Harlene RAYMOND Bowl, DO [facility] Available via Epic secure chat 7am-7pm After these hours, please refer to coverage provider  listed on [email or web address] 04/12/2024, 12:13 PM

## 2024-04-13 ENCOUNTER — Ambulatory Visit: Admitting: Physician Assistant

## 2024-04-24 NOTE — Progress Notes (Signed)
 This care RN came in to give pt his robinol. Noted patient without respiration or heart sound. Second RN Fadila as a second verifier and time of death pronounced at 0201. On call provider Lynwood Kipper, NP made aware.   Daughter Ginnie & Kedar successfully notified   Funeral home information given by family is  [employer] [address] [place], [place], [place] [address] Phone number [phone]   Charge RN called [facility]; eye prep done   See Post-Mortem flowsheet

## 2024-04-24 NOTE — Death Summary Note (Signed)
 DEATH SUMMARY   Patient Details  Name: Matthew Calderon MRN: [id number] DOB: 08/04/1932 ERE:Jfpw, Roetta, MD Admission/Discharge Information   Admit Date:  2024-04-08  Date of Death: Date of Death: Apr 14, 2024  Time of Death: Time of Death: 0201  Length of Stay: 6   Principle Cause of death: intraventricular hemorrhage and subarachnoid hemorrhage   Hospital Diagnoses: Principal Problem:   Intraventricular hemorrhage (HCC) Active Problems:   Subarachnoid hemorrhage (HCC)   Unresponsiveness   Heart failure with improved ejection fraction (HFimpEF) (HCC)   Paroxysmal atrial fibrillation (HCC)   Diabetes (HCC)   OSA (obstructive sleep apnea)   HLD (hyperlipidemia)   Essential hypertension   Coronary artery calcification  Patient presented after being noted to be acutely altered and unresponsive.  On physical exam noted to be moving left lower extremity spontaneously and all other extremities only to noxious stimuli.  CT scan of the head noted large intraventricular hemorrhage.  Family confirmed DNR status.  Neurosurgery have been consulted, but no surgical intervention was agreed upon for which comfort measures were recommended to keep the patient comfortable. # Would anticipate in-hospital death   The results of significant diagnostics from this hospitalization (including imaging, microbiology, ancillary and laboratory) are listed below for reference.   Significant Diagnostic Studies: CT HEAD WO CONTRAST ( ) Addendum Date: 04/08/2024 ADDENDUM #1 ADDENDUM: Findings discussed with Dr. Patsey at 12:59PM on 08-Apr-2024. ---------------------------------------------------- Electronically signed by: Donnice Mania MD Apr 08, 2024 05:30 PM EDT RP Workstation: [id number]   Result Date: Apr 08, 2024 ORIGINAL REPORT  EXAM: CT HEAD WITHOUT CONTRAST Apr 08, 2024 12:36:44 PM TECHNIQUE: CT of the head was performed without the administration of intravenous contrast. Automated exposure control,  iterative reconstruction, and/or weight based adjustment of the mA/kV was utilized to reduce the radiation dose to as low as reasonably achievable. COMPARISON: CT head 02/19/24. CLINICAL HISTORY: Mental status change, unknown cause. FINDINGS: BRAIN AND VENTRICLES: There is a 2.4 x 0.7 x 1.1 cm focus of hemorrhage within the posterior aspect of the right basal ganglia involving the caudate nucleus. There is decompression of hemorrhage into the ventricular system with prominent areas of hyperattenuating clot/blood products within the lateral ventricles. Additional layering hemorrhage within the bilateral occipital horns. Hemorrhage also noted extending into and distending the third ventricle. There is hemorrhage within the cerebral aqueduct and fourth ventricle which also appears slightly distended. Hemorrhage noted extending through the foramen of Luschka. There is likely subarachnoid hemorrhage involving the right cerebellopontine angle cistern. There is hyperattenuating soft tissue at the foramen magnum which may reflect additional extra-axial blood products. There is increased caliber of the lateral ventricles compared to the prior study. Generalized parenchymal volume loss. Atherosclerosis of the carotid siphons. Bilateral lens replacement. No evidence of acute infarct. No mass effect or midline shift. ORBITS: Bilateral lens replacement. No acute abnormality. SINUSES: No acute abnormality. SOFT TISSUES AND SKULL: No acute soft tissue abnormality. No skull fracture. 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 IMPRESSION: 1. Hemorrhage centered within the right caudate with decompression into the ventricular system, resulting in prominent diffuse intraventricular hemorrhage and increased ventricular caliber compared to prior study. 2. Subarachnoid hemorrhage involving the right cerebellopontine angle cistern. 3. Also likely additional extra-axial blood products at the foramen magnum. Electronically  signed by: Donnice Mania MD 08-Apr-2024 12:53 PM EDT RP Workstation: [id number]   DG Chest Port 1 View Result Date: 2024/04/08 CLINICAL DATA:  Sepsis. EXAM: PORTABLE CHEST 1 VIEW COMPARISON:  02/19/2024 FINDINGS: Low volume film with stable asymmetric elevation of the right  hemidiaphragm. Subtle patchy airspace disease is seen in the lung bases, likely atelectatic although pneumonia not excluded. The cardio pericardial silhouette is enlarged. Bones are diffusely demineralized. Telemetry leads overlie the chest. IMPRESSION: Low volume film with subtle patchy airspace disease in the lung bases, likely atelectatic although pneumonia not excluded. Electronically Signed   By: Camellia Candle M.D.   On: 04/07/2024 12:37    Microbiology: Recent Results (from the past 240 hours)  Resp panel by RT-PCR (RSV, Flu A&B, Covid) Anterior Nasal Swab     Status: None   Collection Time: 04/07/24 11:37 AM   Specimen: Anterior Nasal Swab  Result Value Ref Range Status   SARS Coronavirus 2 by RT PCR NEGATIVE NEGATIVE Final   Influenza A by PCR NEGATIVE NEGATIVE Final   Influenza B by PCR NEGATIVE NEGATIVE Final    Comment: (NOTE) The Xpert Xpress SARS-CoV-2/FLU/RSV plus assay is intended as an aid in the diagnosis of influenza from Nasopharyngeal swab specimens and should not be used as a sole basis for treatment. Nasal washings and aspirates are unacceptable for Xpert Xpress SARS-CoV-2/FLU/RSV testing.  Fact Sheet for Patients: BloggerCourse.com  Fact Sheet for Healthcare Providers: SeriousBroker.it  This test is not yet approved or cleared by the [place]  FDA and has been authorized for detection and/or diagnosis of SARS-CoV-2 by FDA under an Emergency Use Authorization (EUA). This EUA will remain in effect (meaning this test can be used) for the duration of the COVID-19 declaration under Section 564(b)(1) of the Act, 21 U.S.C. section 360bbb-3(b)(1),  unless the authorization is terminated or revoked.     Resp Syncytial Virus by PCR NEGATIVE NEGATIVE Final    Comment: (NOTE) Fact Sheet for Patients: BloggerCourse.com  Fact Sheet for Healthcare Providers: SeriousBroker.it  This test is not yet approved or cleared by the [place]  FDA and has been authorized for detection and/or diagnosis of SARS-CoV-2 by FDA under an Emergency Use Authorization (EUA). This EUA will remain in effect (meaning this test can be used) for the duration of the COVID-19 declaration under Section 564(b)(1) of the Act, 21 U.S.C. section 360bbb-3(b)(1), unless the authorization is terminated or revoked.  Performed at [facility], [address], [place], [place] [address]   Blood Culture (routine x 2)     Status: None   Collection Time: 04/07/24 11:50 AM   Specimen: BLOOD  Result Value Ref Range Status   Specimen Description BLOOD RIGHT ANTECUBITAL  Final   Special Requests   Final    BOTTLES DRAWN AEROBIC AND ANAEROBIC Blood Culture adequate volume   Culture   Final    NO GROWTH 5 DAYS Performed at [facility], [address], [place], [place] [address]    Report Status 04/12/2024 FINAL  Final  Blood Culture (routine x 2)     Status: None   Collection Time: 04/07/24  1:17 PM   Specimen: BLOOD RIGHT HAND  Result Value Ref Range Status   Specimen Description BLOOD RIGHT HAND  Final   Special Requests   Final    BOTTLES DRAWN AEROBIC AND ANAEROBIC Blood Culture results may not be optimal due to an inadequate volume of blood received in culture bottles   Culture   Final    NO GROWTH 5 DAYS Performed at [facility], [address], [place], [place] [address]    Report Status 04/12/2024 FINAL  Final    Time spent: 45 minutes  Signed: Marlen Mollica U Loranda Mastel, DO May 09, 2024

## 2024-04-24 NOTE — Progress Notes (Signed)
    OVERNIGHT PROGRESS REPORT  Notified by RN that patient is deceased as of 69 Hrs  Patient was DNR/comfort measures only ([facility]) followed by Palliative   2 RN verified.   Lynwood Kipper MSNA MSN ACNPC-AG Acute Care Nurse Practitioner [facility]

## 2024-04-24 DEATH — deceased
